# Patient Record
Sex: Female | Born: 1950
Health system: Southern US, Community
[De-identification: ages and names within clinical notes are randomized; demographics above are authoritative.]

## PROBLEM LIST (undated history)

## (undated) DIAGNOSIS — C801 Malignant (primary) neoplasm, unspecified: Secondary | ICD-10-CM

## (undated) DIAGNOSIS — I10 Essential (primary) hypertension: Secondary | ICD-10-CM

## (undated) DIAGNOSIS — G709 Myoneural disorder, unspecified: Secondary | ICD-10-CM

## (undated) DIAGNOSIS — R0600 Dyspnea, unspecified: Secondary | ICD-10-CM

## (undated) DIAGNOSIS — M1990 Unspecified osteoarthritis, unspecified site: Secondary | ICD-10-CM

## (undated) HISTORY — PX: COLONOSCOPY: SHX174

## (undated) HISTORY — DX: Essential (primary) hypertension: I10

## (undated) HISTORY — PX: CARPAL TUNNEL RELEASE: SHX101

## (undated) HISTORY — PX: TONSILLECTOMY: SUR1361

## (undated) HISTORY — PX: BACK SURGERY: SHX140

## (undated) HISTORY — PX: LAPAROSCOPIC CHOLECYSTECTOMY: SUR755

## (undated) HISTORY — PX: ANKLE SURGERY: SHX546

## (undated) HISTORY — PX: KNEE SURGERY: SHX244

---

## 1980-10-15 HISTORY — PX: TUBAL LIGATION: SHX77

## 1990-10-15 DIAGNOSIS — G709 Myoneural disorder, unspecified: Secondary | ICD-10-CM

## 1990-10-15 HISTORY — DX: Myoneural disorder, unspecified: G70.9

## 2000-01-17 ENCOUNTER — Ambulatory Visit (HOSPITAL_COMMUNITY): Admission: RE | Admit: 2000-01-17 | Discharge: 2000-01-17 | Payer: Self-pay | Admitting: Family Medicine

## 2000-01-17 ENCOUNTER — Encounter: Payer: Self-pay | Admitting: Family Medicine

## 2000-02-05 ENCOUNTER — Inpatient Hospital Stay (HOSPITAL_COMMUNITY): Admission: RE | Admit: 2000-02-05 | Discharge: 2000-02-09 | Payer: Self-pay | Admitting: Neurosurgery

## 2000-03-25 ENCOUNTER — Encounter: Admission: RE | Admit: 2000-03-25 | Discharge: 2000-03-25 | Payer: Self-pay | Admitting: Neurosurgery

## 2000-06-25 ENCOUNTER — Encounter: Admission: RE | Admit: 2000-06-25 | Discharge: 2000-06-25 | Payer: Self-pay | Admitting: Neurosurgery

## 2000-09-27 ENCOUNTER — Encounter: Admission: RE | Admit: 2000-09-27 | Discharge: 2000-09-27 | Payer: Self-pay | Admitting: Neurosurgery

## 2000-10-10 ENCOUNTER — Ambulatory Visit (HOSPITAL_COMMUNITY): Admission: RE | Admit: 2000-10-10 | Discharge: 2000-10-10 | Payer: Self-pay | Admitting: Neurosurgery

## 2001-09-02 ENCOUNTER — Encounter: Admission: RE | Admit: 2001-09-02 | Discharge: 2001-09-02 | Payer: Self-pay | Admitting: Neurosurgery

## 2002-03-02 ENCOUNTER — Inpatient Hospital Stay (HOSPITAL_COMMUNITY): Admission: RE | Admit: 2002-03-02 | Discharge: 2002-03-03 | Payer: Self-pay | Admitting: Neurosurgery

## 2003-11-04 ENCOUNTER — Ambulatory Visit (HOSPITAL_COMMUNITY): Admission: RE | Admit: 2003-11-04 | Discharge: 2003-11-04 | Payer: Self-pay | Admitting: Neurosurgery

## 2008-09-17 ENCOUNTER — Ambulatory Visit (HOSPITAL_COMMUNITY): Admission: RE | Admit: 2008-09-17 | Discharge: 2008-09-17 | Payer: Self-pay | Admitting: Neurosurgery

## 2008-10-20 ENCOUNTER — Inpatient Hospital Stay (HOSPITAL_COMMUNITY): Admission: RE | Admit: 2008-10-20 | Discharge: 2008-10-23 | Payer: Self-pay | Admitting: Neurosurgery

## 2010-10-18 ENCOUNTER — Ambulatory Visit
Admission: RE | Admit: 2010-10-18 | Discharge: 2010-10-19 | Payer: Self-pay | Source: Home / Self Care | Attending: Orthopedic Surgery | Admitting: Orthopedic Surgery

## 2010-10-18 LAB — POCT HEMOGLOBIN-HEMACUE: Hemoglobin: 15.8 g/dL — ABNORMAL HIGH (ref 12.0–15.0)

## 2010-12-25 LAB — BASIC METABOLIC PANEL
BUN: 13 mg/dL (ref 6–23)
CO2: 31 mEq/L (ref 19–32)
Calcium: 9.2 mg/dL (ref 8.4–10.5)
Chloride: 102 mEq/L (ref 96–112)
Creatinine, Ser: 0.75 mg/dL (ref 0.4–1.2)
GFR calc Af Amer: >60 mL/min (ref >60)
GFR calc non Af Amer: >60 mL/min (ref >60)
Glucose, Bld: 98 mg/dL (ref 70–99)
Potassium: 3.8 mEq/L (ref 3.5–5.1)
Sodium: 140 mEq/L (ref 135–145)

## 2011-01-29 LAB — BASIC METABOLIC PANEL
BUN: 10 mg/dL (ref 6–23)
CO2: 34 mEq/L — ABNORMAL HIGH (ref 19–32)
Calcium: 8.5 mg/dL (ref 8.4–10.5)
Chloride: 102 mEq/L (ref 96–112)
Creatinine, Ser: 1.06 mg/dL (ref 0.4–1.2)
GFR calc Af Amer: >60 mL/min (ref >60)
GFR calc non Af Amer: 54 mL/min — ABNORMAL LOW (ref >60)
Glucose, Bld: 125 mg/dL — ABNORMAL HIGH (ref 70–99)
Potassium: 4.3 mEq/L (ref 3.5–5.1)
Sodium: 140 mEq/L (ref 135–145)

## 2011-01-29 LAB — CBC
HCT: 35.4 % — ABNORMAL LOW (ref 36.0–46.0)
Hemoglobin: 11.9 g/dL — ABNORMAL LOW (ref 12.0–15.0)
MCHC: 33.6 g/dL (ref 30.0–36.0)
MCV: 90.7 fL (ref 78.0–100.0)
Platelets: 203 10*3/uL (ref 150–400)
RBC: 3.91 MIL/uL (ref 3.87–5.11)
RDW: 13.9 % (ref 11.5–15.5)
WBC: 6.8 10*3/uL (ref 4.0–10.5)

## 2011-02-27 NOTE — Op Note (Signed)
NAME:  Kathryn [name], Kathryn [name]              ACCOUNT NO.:  [id number]   MEDICAL RECORD NO.:  [id number]          PATIENT TYPE:  INP   LOCATION:  3009                         FACILITY:  [facility]   PHYSICIAN:  Cristi Loron, M.D.DATE OF BIRTH:  01-Nov-1950   DATE OF PROCEDURE:  10/20/2008  DATE OF DISCHARGE:                               OPERATIVE REPORT   BRIEF HISTORY:  The patient is a 60 year old white female who has had  multiple prior back surgeries, and she has had worsening back and leg  pain consistent with neurogenic claudication.  She failed medical  management, worked up with a lumbar myelo-CT, which demonstrated she had  multilevel spinal stenosis and degenerative changes.  I discussed the  various treatments options with the patient including surgery.  The  patient has weighed the risks, benefits, and alternative of surgery and  decided to proceed with a decompressive lumbar laminectomy.   PREOPERATIVE DIAGNOSES:  Degenerative disk disease, L2-3, L3-4, L4-5, L5-  1; spinal stenosis; lumbar radiculopathy/myelopathy; lumbago.   POSTOPERATIVE DIAGNOSES:  Degenerative disk disease, L2-3, L3-4, L4-5,  L5-1; spinal stenosis; lumbar radiculopathy/myelopathy; lumbago.   PROCEDURE:  Decompressive laminectomy at L2, L3, L4, and L5 to  decompress the bilateral L2, 3, 4, 5, and S1 nerve roots using  microdissection.   SURGEON:  Tressie Stalker, MD   ASSISTANT:  Colon Branch, MD   ANESTHESIA:  General endotracheal.   ESTIMATED BLOOD LOSS:  100 mL.   SPECIMEN:  None.   DRAINS:  None.   COMPLICATIONS:  None.   DESCRIPTION OF PROCEDURE:  The patient was brought to the operating room  by anesthesia team.  General endotracheal anesthesia was induced.  The  patient was turned to the prone position on the Wilson frame.  The  lumbosacral region was then prepared with Betadine scrub and Betadine  solution.  Sterile drapes were applied.  I then injected the area to be  incised with  Marcaine with epinephrine solution and used a scalpel to  make a linear midline incision through the patient's previous surgical  scar.  We then used electrocautery to perform a bilateral subperiosteal  dissection exposing spinous process and lamina of L1 down to the upper  sacrum.  We obtained intraoperative radiograph to confirm our location.  We then inserted a McCullough and cerebellar retractors for exposure.   We began the decompression by incising interspinous ligament at L1-2, L2-  3, L3-4, L4-5.  We then used the Leksell rongeur to remove the spinous  process of L2, 3, 4 and 5.  We then used the high-speed drill to perform  bilateral L2, L3, L4, and L5 laminotomies.  We completed the  laminectomies from L2 to L5 using the Kerrison punch and removed the L2-  3, L3-4, L4-5, and L5-1 ligamenta flava.  We encountered some epidural  scar tissue from the patient's prior surgeries.  We then used  microdissection to free up the thecal sac and the nerve roots from the  epidural fibrosis and at the overgrowth of ligamentum flavum and the  facets.  We used Kerrison punches to remove the excess ligament  and the  medial aspects of the facets bilaterally and performed a foraminotomy  about the bilateral L2, L3, L4, L5, and S1 nerve roots.  In the process  of doing this, we did not note that at L4-5 on the right, there was an  area of thinned dura.  There was no CSF leakage.  We then inspected the  intervertebral disks at L2-3, L3-4, L4-5, and L5-1 and noted they were  bulging and degenerated, but there was no disk herniation.  We palpated  along the exit route of the bilateral L2, L3, L4, L5, and S1 nerve roots  and noted the neural structures were well decompressed.  We obtained  hemostasis using bipolar cautery.  We irrigated the wound out with  bacitracin solution.  We then laid a piece of DuraGen over the thinned  dura and then placed some Tisseel over this.  We then removed the   retractors and reapproximated the patient's thoracolumbar fascia with  interrupted #1 Vicryl suture, subcutaneous tissue with interrupted 2-0  Vicryl suture, and skin with a running 4-0 nylon suture.  The wound was  then coated with bacitracin ointment and sterile dressings applied.  The  drapes were removed, and the patient was subsequently extubated by the  anesthesia team and transported to the postanesthesia care unit in  stable condition.  All sponge, instrument, and needle counts were  correct at the of the case.      Cristi Loron, M.D.  Electronically Signed     JDJ/MEDQ  D:  10/20/2008  T:  10/21/2008  Job:  [id number]

## 2011-03-02 NOTE — H&P (Signed)
[place]. [facility]  Patient:    Kathryn [name], Kathryn [name]                     MRN: [id number] Adm. Date:  [id number] Attending:  Tressie Stalker D                         History and Physical  CHIEF COMPLAINT:  Right leg pain.  HISTORY OF PRESENT ILLNESS:  This patient is a 60 year old white female who has had prior back problems.  She has underwent a lumbar laminectomy in 1974 and 1984 by Dr. Vear Clock and Dr. Hope Pigeon.  She did well, but in 1997 she had a recurrence of her back pain.  She was treated conservatively, i.e. with medications and steroid injection by Dr. Corlis Hove.  She improved, but in February 2000 she began having increasing numbness in her left leg and severe pain into her right leg. She has been treated with various medications and she failed to improve.  She was sent for lumbar MRI which demonstrated disc herniation, and she was kindly sent for y consultation.  The patient complains of some chronic numbness in her left leg, which she can live with.  She has pain in her right leg, which radiates into her right hip, down the lateral aspect of her right leg, and wraps around her knee anteriorly.  It does not radiate past the knee, does not go down to the foot.  Her back bothers her less  than her leg, and her right leg greater than her left leg.  PAST MEDICAL HISTORY:  Positive for remote history of ruptured disk, cholelithiasis, carpal tunnel syndrome, ankle troubles.  MEDICATIONS PRIOR TO ADMISSION: 1. Celebrex p.o. q.d. 2. Cyclobenzaprine 10 mg p.o. t.i.d. 3. Oxycodone p.r.n.  ALLERGIES:  No known drug allergies.  PAST SURGICAL HISTORY:  Lumbar laminectomy and diskectomy in 1974 and 1981, cholecystectomy in 1994, carpal tunnel release, and five ankle surgeries.  FAMILY MEDICAL HISTORY:  Patients mother is aged 93.  She suffers from Alzheimers disease.  The patients father died age 4 secondary to colon  cancer.  SOCIAL HISTORY:  The patient is a widow, she has two children.  She lives in [place].  She denies tobacco and drug use.  She occasionally drinks alcohol.  PHYSICAL EXAMINATION:  GENERAL:  A morbidly-obese, pleasant 61 year old white female who walks with an  antalgic gait, complaining of right leg pain.  VITAL SIGNS:  Height 5 feet 6 inches, weight 250 pounds.  HEENT:  Normocephalic, atraumatic.  Pupils are equal, round and reactive to light, extraocular muscles intact.  Sclerae white and conjunctivae pink.  Oropharynx benign.  Uvula midline.  NECK:  Supple, there are no masses, meningismus, deformities, tracheal deviation, jugular venous distension, or carotid bruits.  She has normal cervical range of  motion.  Spurling test is negative, Lhermittes sign was not present.  THORAX:  Symmetric.  LUNGS:  Clear to auscultation.  HEART:  Regular rate and rhythm.  ABDOMEN:  Soft, nontender, morbidly obese.  BACK:  She has diffuse tenderness to palpation at the lumbosacral junction. She has a large, well-healed lumbar incision without signs of infection.  There are no deformities, point tenderness.  Straight leg raising is positive on the right, negative on the left.  Fabers testing negative bilaterally.  EXTREMITIES:  No obvious deformities, morbidly obese.  NEUROLOGIC:  The patient is alert and oriented x 3.  Cranial nerves 2-12 are  grossly intact bilaterally.  Vision is grossly normal bilaterally with corrective lenses.  Hearing is grossly normal bilaterally.  Motor strength is 5/5 in her bilateral deltoid, biceps, triceps, hand grips, wrist extensors, inner osseus, gastrocnemius, extensor hallucis longus, and left psoas and quadriceps.  The patients right psoas and quadriceps strength is diminished at 4/5.  Sensory exam is grossly normal to light touch and pinprick sensation except in lateral aspect of her left leg.  Cerebellar exam is intact to rapid  alternating movements of the upper extremities bilaterally.  Deep tendon reflexes are difficult to assess in the patients upper extremities, as they are quite large, but appear to be 1/4 in her bilateral biceps, triceps, brachioradialis.  She has trace gastrocnemius reflexes bilaterally and 1/4 left quadricepss reflex.  Her right quadricep reflex is absent.  IMAGING STUDIES:  The patient has a lumbar MRI performed with and without contrast on January 17, 2000 at [facility] which demonstrates severe degenerative disease at L4-5 and a large right herniated nucleus pulposus at L3-4.  She has degenerative disk disease and prior surgery at L5-S1, but does not seem to have any ongoing neural compression.  ASSESSMENT AND PLAN:  L3-4 and L4-5 degenerative disk disease, herniated nucleus pulposus, spinal stenosis, spondylosis.  I have discussed this issue with the patient and her son and reviewed the MRI scan with them.  She has multiple problems with her back, but the biggest problem seemed to be at L4-5 and L3-4.  At L4-5, she has a significant degenerative disease, spondylosis, and spinal stenosis.  I discussed with her an option of a posterior lumbar interbody fusion with placement of titanium interbody cages.  I also discussed doing a microdiskectomy at L3-4 n the right to remove the ruptured disk.  I have shown them surgical models, discussed the risks of surgery extensively.  I also discussed the alternatives f continued medical management, doing nothing, steroid injections, etc.  The patient has weighed the risks, benefits, and alternatives of surgery and would like to proceed with the L3-4 microdiskectomy and L4-5 posterior lumbar interbody fusion. DD:  02/05/00 TD:  02/06/00 Job: [id number]

## 2011-03-02 NOTE — Op Note (Signed)
NAME:  Kathryn [name], Kathryn [name]                        ACCOUNT NO.:  [id number]   MEDICAL RECORD NO.:  [id number]                   PATIENT TYPE:  OIB   LOCATION:  2870                                 FACILITY:  [facility]   PHYSICIAN:  Cristi Loron, M.D.            DATE OF BIRTH:  1950-12-08   DATE OF PROCEDURE:  11/04/2003  DATE OF DISCHARGE:  11/04/2003                                 OPERATIVE REPORT   HISTORY:  The patient is a 60 year old white female who suffered _________.  She was worked up with NCVs which demonstrated carpal tunnel syndrome.  Her  symptoms were consistent with carpal tunnel syndrome as well.  She failed  medical management.  I discussed the various treatment options with the  patient including surgery.  She weighed the risks, benefits, and  alternatives of surgery and decided to proceed with carpal tunnel release on  the right.   PREOPERATIVE DIAGNOSIS:  Right carpal tunnel syndrome.   POSTOPERATIVE DIAGNOSIS:  Right carpal tunnel syndrome.   PROCEDURE:  Right carpal tunnel release (median nerve neurolysis at the  wrist).   SURGEON:  Cristi Loron, M.D.   ASSISTANT:  None.   ANESTHESIA:  MAC.   SPECIMENS:  None.   DRAINS:  None.   COMPLICATIONS:  None.   DESCRIPTION OF PROCEDURE:  The patient was brought to the operating room by  the anesthesia team.  She was sedated.  Her right upper extremity was then  prepared with Betadine scrub with Betadine solution and sterile drapes were  applied.  I then injected the area to be incised with Marcaine with  epinephrine solution.  Using a scalpel, I made an incision along the  patient's palmar crease.  I used electrocautery to dissect down to the  superficial fascia, divided it with a 15 scalpel, and then dissected down to  the deep fascia, the transverse carpal ligament.  I incised it with a 15  scalpel carefully __________.  I transected it and then identified the  median nerve.  I then ligated the  transverse carpal ligament both proximally  and distally with the Metzenbaum scissors staying along the ulnar aspect of  the median nerve with good proximal and distal decompression.  I then  achieved hemostasis using bipolar cautery.  I then reapproximated the  patient's skin with a running 3-0 nylon suture.  The patient's wound was  then coated with bacitracin ointment and sterile dressings were applied.  The drapes were removed.  The patient was subsequently transported to the  postanesthesia care unit in stable condition.  All sponge, needle, and  instrument counts were correct at the end of this case.                                              Duane Lope.  Lovell Sheehan, M.D.   JDJ/MEDQ  D:  11/04/2003  T:  11/05/2003  Job:  [id number]

## 2011-03-02 NOTE — Discharge Summary (Signed)
[place]. [facility]  Patient:    Kathryn [name], Kathryn [name]                     MRN: [id number] Adm. Date:  [id number] Disc. Date: [id number] Attending:  Tressie Stalker D                           Discharge Summary  For full details of this admission, please refer to typed history and physical.  HISTORY OF PRESENT ILLNESS:  The patient is a 60 year old white female who has undergone a lumbar laminectomy in 1974 and 1984.  She initially did well but she has had ongoing back and right leg pain.  She failed medical management and therefore weighed the risks, benefits, and alternatives of surgery, and decided to proceed with a lumbar decompression, stabilization, and fusion after an MR scan demonstrated significant disk pathology.  For past medical history, past surgical history, medications prior to admission, drug allergies, family medical history, social history, admission physical exam, imaging studies, assessment and planning, etc., please refer to typed history and physical.  HOSPITAL COURSE:  I admitted the patient to [facility] on April 23,2001, with the diagnosis of L3-4 and L4-5 degenerative disease, herniated nucleus pulposus, spondylosis, and spinal stenosis.  On the day of admission, I performed a bilateral L4 re-do laminotomy and foraminotomy with a Gill procedure and posterior lumbar interbody fusion with insertion of titanium interbody cages (14 x 26 mm) at L4-5, and using local morcellized autograft bone and I did a lumbar diskectomy at L3-4 on the right.  The surgery went well without complications.  (For full details of this operation, please refer to typed operative note.  Postoperative course:  The patients postoperative course was essentially unremarkable.  She had considerable postoperative pain as expected and this was managed on a morphine PCA pump.  By February 09, 2000, the patient was afebrile, vital signs were stable, she was eating  well, and ambulating well. Her wound was healing well without signs of infection, and she was requesting discharge to home.  She was therefore discharged on February 09, 2000.  DISCHARGE INSTRUCTIONS:  Patient was given written discharge instructions and instructed to wear a lumbosacral corset at all times while out of bed, and to follow up with me in three to four weeks.  DISCHARGE MEDICATIONS: 1. Percocet #60 one to two p.o. q.4h. p.r.n. for pain. 2. Valium 5 mg #50 one p.o. q.6h. p.r.n. for muscle spasms.  FINAL DIAGNOSES: 1. L3-4 and L4-5 degenerative disease. 2. Spondylosis. 3. Herniated nucleus pulposus. 4. Spinal stenosis.  PROCEDURE PERFORMED:  L4-5 re-do laminectomy, Gill procedure, posterior lumbar interbody fusion, L4-5, using local morcellized autograft bone and insertion of titanium interbody cages, ______ diskectomy, L3-4. DD:  02/28/00 TD:  02/29/00 Job: [id number]

## 2011-03-02 NOTE — Consult Note (Signed)
[facility]  Patient:    Kathryn [name], Kathryn [name]                     MRN: [id number] Adm. Date:  [id number] Attending:  Tressie Stalker D                          Consultation Report  At this point, I had a good decompression of the thecal sac and the bilateral L5 and L4 nerve roots at L4-5.  I now turned my attention to the arthrodesis.  I brought the intraoperative fluoroscope into the field and under its guidance, I  placed instrumentation.  I used the retractors to gently retract the left L5 nerve root and the thecal sac medially, the left L4 nerve root in a cephalad direction. I inserted the vertebral body spreader into the left L4-5 interspace and distracted the vertebral bodies.  I then carefully retracted the right L5 nerve root medially, the right L4 nerve root in a cephalad direction and inserted a 14 mm Tang retractor under fluoroscopic guidance.  I then drilled the interspace, tapped it, inserted a 14 x 26 mm interbody cage under fluoroscopic guidance (Ray), then removed the Tang retractor, inspected the thecal sac and the right L5 and L4 nerve roots for any  damage.  There was not any.  I then removed the vertebral body spreader on the left, inserted the Tang retractor on the left in a similar fashion and inserted  another 14 x 26 mm cage on the left side.  Once again, the nerve roots were not  injured.  I then filled the interbody cage with morcellized autograph bone and hen placed a plastic cap over the cage.  Having completed the arthrodesis at L4-5, I now turned my attention to the diskectomy at L3-4.  I performed a right L3 laminotomy.  I drilled up the right L3 lamina with the Midas Rex high-speed drill.  I removed the right L3-4 ligamentum flavum and widened the laminotomy and decompressed the thecal sac.  I identified the L4 nerve root and then I palpated just ventral to it with the nerve hook and noted there was a large  free-fragment disk herniation.  I freed it up with the microdissectors and then removed it in several large fragments, decompressing the thecal sac and the L4 nerve root.  I then palpated about the ventral surface of the thecal sac and then the L4 nerve root, and it was well-decompressed.  I achieved hemostasis using bipolar electrocautery and Gelfoam.  I then palpated about the  L4-5 thecal sac and the bilateral L5 nerve roots and the extraforaminal L4 nerve roots and noted that they were well-decompressed.  I copiously irrigated the wound with bacitracin solution and removed the solution.  I inspected the dura for any CSF leak.  I did not see any.  I then removed the McCullough retractor and reapproximated the patients thoracolumbar fascia with interrupted #1 Vicryl and  the subcutaneous tissue with interrupted 2-0 Vicryl, the skin with a running 3-0 nylon suture.  The wound was then covered with bacitracin ointment and a sterile dressing was applied and drapes were removed, and the patient subsequently returned to the supine position, where she was extubated by the anesthesia team and transported to the postanesthesia care unit in stable condition.  All sponge, instrument, and needle counts were correct at the end of the case. DD:  02/05/00 TD:  02/06/00  Job: [id number]

## 2011-03-02 NOTE — Discharge Summary (Signed)
NAME:  Kathryn [name], Kathryn [name]              ACCOUNT NO.:  [id number]   MEDICAL RECORD NO.:  [id number]          PATIENT TYPE:  INP   LOCATION:  3009                         FACILITY:  [facility]   PHYSICIAN:  Cristi Loron, M.D.DATE OF BIRTH:  August 09, 1951   DATE OF ADMISSION:  10/20/2008  DATE OF DISCHARGE:  10/23/2008                               DISCHARGE SUMMARY   BRIEF HISTORY:  The patient is a 60 year old white female who has had  multiple prior back surgeries.  The patient has had worsening back and  leg pain consistent with neurogenic claudication.  She failed medical  management and was worked up with a lumbar CT which demonstrated the  patient had multilevel spinal stenosis and degenerative changes.  I  discussed various treatments options with the patient including the  surgery.  The patient has weighed the risks, benefits, and alternatives  of surgery and decided to proceed with a decompressive lumbar  laminectomy.   For further details of this admission, please refer to typed history and  physical.   HOSPITAL COURSE:  Admitted the patient to [facility] on March 24, 2005.  On the day of admission, I performed an L2, L3, L4, and L5  laminectomy.  The surgery went well (for full details of this operation,  please refer to typed operative note).   POSTOPERATIVE COURSE:  The patient's postoperative course was  unremarkable and she was discharged home on postoperative day #3, i.e.,  October 23, 2008.   DISCHARGE INSTRUCTIONS:  The patient is instructed to follow up with me  in 1 to 2 weeks of suture removal.   DISCHARGE PRESCRIPTIONS:  1. Percocet 10/325, #100, one p.o. q.4 h. as needed for pain.  2. Valium 5 mg, #50, one p.o. q.8 h. p.r.n. for muscle spasm.  3. Neurontin 600 mg, #90, one p.o. t.i.d.   FINAL DIAGNOSES:  L2-3, L3-4, L4-5, and L5-S1 degenerative disease,  spinal stenosis, lumbar radiculopathy, and lumbago.   PROCEDURE PERFORMED:  1. Decompressive  laminectomy at L2, L3, L4, and L5.  2. Decompressive bilateral L2, L3, L4, L5 as well as S1 nerve roots      using microdissection.      Cristi Loron, M.D.  Electronically Signed     JDJ/MEDQ  D:  11/23/2008  T:  11/24/2008  Job:  [id number]

## 2011-03-02 NOTE — Op Note (Signed)
[place]  Patient:    Kathryn [name], Kathryn [name]                     MRN: [id number] Proc. Date: 02/05/00 Adm. 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  PROCEDURE:                    L4 redo laminotomy-foraminotomy with posterior/                               Gill procedure with posterior lumbar interbody                               fusion L4-5 using titanium interbody cages (Ray                               cages) 14 x 26 mm, and local morcellized                               autograft bone; a lumbar diskectomy at L3-4 on                               the right.  SURGEON:                      Cristi Loron, M.D.  ASSISTANT:                    Dr. Lynne Logan _____.  ANESTHESIA:                   General endotracheal.  ESTIMATED BLOOD LOSS:         Less than 300 cc.  SPECIMENS:                    None.  DRAINS:                       None.  BRIEF HISTORY:  The patient is a 60 year old white female who has undergone a lumbar laminectomy in 1974 and 1984.  She initially did well, but she has had ongoing back and now right leg pain.  She has failed medical management, therefore weighed the risks, benefits, and alternatives of surgery, desired to proceed with a lumbar decompression and stabilization and fusion after an MR scan demonstrated significant disk pathology.  DESCRIPTION OF PROCEDURE:  The patient was brought to the operating  room by the anesthesia team.  General endotracheal anesthesia was induced.  The patient was then turned to the prone position on the Wilson frame.  The  left sacral region was then prepared with Betadine scrub and Betadine solution and sterile drapes were applied, and we then injected the area to be incised with Marcaine with epinephrine solution.  I used a scalpel to ellipse out the patients prior surgical incision.  I encountered significant scarring.  I used electrocautery to dissect out the thoracolumbar fascia.  I divided the fascia bilaterally, performing bilateral subperiosteal dissections, stripping the paraspinous musculature from the spinous processes of the lamina bilaterally of L3, L4, and L5.  I inserted the McCullough retractor for exposure.  I obtained intraoperative radiograph to confirm the location.  I then used the Leksell rongeur to remove some of the abundant scar tissue from the periosteal region.  I then used the Midas Rex high-speed drill to perform bilateral L4 laminotomies.  I drilled in a stepwise direction until I encountered the insertion of the ligamentum flavum.  I then widened the laminotomies with a Kerrison punch, removing the L4-5 ligamentum flavum, and performed a foraminotomy about the L5 nerve root, identifying and decompressing the L4 nerve root in the neural foramen.  I carefully dissected the abundant scar tissue from the epidural space to free up the nerve sac.  In doing this on the left side, I did create a small unintentional durotomy.  I sewed this up with a single figure-of-eight 6-0 Prolene suture.  I performed a Valsalva maneuver on the patient and there was no further spinal fluid leak.  I then incised into the intervertebral disk at L5 and performed an aggressive diskectomy at L4-5 with the Epstein and Scoville curets and the pituitary forceps.  DD:  02/05/00 TD:  02/06/00 Job: [id number]

## 2011-03-02 NOTE — Consult Note (Signed)
[place]  Patient:    Kathryn [name], Kathryn [name] Visit Number: [id number] MRN: [id number]          Service Type: SUR Location: [id number] Attending Physician:  Cristi Loron Dictated by:   Cristi Loron, M.D. Admit Date:  03/02/2002 Discharge Date: 03/03/2002                            Consultation Report  BRIEF HISTORY:  The patient is a 60 year old white female who has suffered from back and leg pain for many years. She has undergone previous surgeries including an L4-L5 push and lumbar interbody fusion in place of  interbody titanium cages by me. She has had chronic back pain, but things worsened several months ago when she began having severe pain radiating down the back of her left leg and more moderate pain radiating down more or less the side of her right leg. She failed medical management, was worked up with a lumbar MRI which demonstrated recurrent herniated disk at L3-L4 on the right and L5-S1 on the left. The fuse level i.e. L4-L5 looked pretty good. I discussed the various treatment options with her including doing nothing, continued medical management, and surgery. The patient weighed the risks, benefits, alternatives, surgeons, and decides to proceed with a redo microdiskectomy.  PREOPERATIVE DIAGNOSES: 1. Right L3-L4 recurrent herniated nucleus pulposus. 2. Left L5-S1 recurrent herniated nucleus pulposus. 3. Lumbar degenerative disk disease. 4. Lumbar spinal stenosis. 5. Lumbar radiculopathy.  POSTOPERATIVE DIAGNOSES: 1. Right L3-L4 recurrent herniated nucleus pulposus. 2. Left L5-S1 recurrent herniated nucleus pulposus. 3. Lumbar degenerative disk disease. 4. Lumbar spinal stenosis. 5. Lumbar radiculopathy.  PROCEDURE:  Redo right L3-L4 microdiskectomy; redo left L5-S1 microdiskectomy using microdissection.  SURGEON:  Cristi Loron, M.D.  ASSISTANT:  Clydene Fake, M.D.  ANESTHESIA:  General  endotracheal.  ESTIMATED BLOOD LOSS:  200 cc.  SPECIMENS:  None.  DRAIN:  None.  COMPLICATIONS:  None.  DESCRIPTION OF PROCEDURE:  The patient was brought to the operating room by the anesthesia team. General endotracheal anesthesia was induced. The patient was then turned to the prone position on the Wilson frame. Her lumbosacral region was then prepared with Betadine and scrubbed using Betadine solution. Sterile drapes were applied and then injected the air to midsize with Marcaine with epinephrine solution and then used the scalpel to make a linear midline incision through the patients preexisting surgical scar. I used electrocautery to dissect down to thoracolumbar fascia and divided the fascia just to the right of the midline at L3-L4 and performed a right-sided subperiosteal dissection, stripping the right paraspinous musculature from the right spinous process lamina of L3 and L4. I then inserted the McCullough retractor for exposure and obtained an intraoperative radiographic to confirm the location. Then brought the operative microscopes in the field, enlarged magnification and illumination, I completed the microdissection/decompression. I then used the high-speed drill to clear some of the soft tissue and scar tissue from the right L3 and L4 lamina. I then extended the previous right L3 and L4 lamina, and then using high-speed drill to extend the patients prior right L3 laminotomy in the cephalad direction. I drilled until I encountered some relatively nonscarred dura. I then used microdissection to free up the dura from the epidural fibrosis and then I widened the right L3 laminotomy and removed some epidural scar tissue. Decompressing the thecal sac, I identified the L4 nerve root and performed  a foraminotomy about it. I used microsection to free up the thecal sac and the L4 root nerve root from the underlying disk herniation and then I gently retracted the thecal sac and  the right L4 nerve root in the medial direction exposing the underlying herniated disk. I incised the right L3-L4 herniated disk and removed several fragments using pituitary forceps. It had migrated in the cephalad and caudal direction and was compressing the L4 nerve root. I then entered the intervertebral disk space and performed a partial diskectomy using new pituitary forceps and the arthroscope with curettes. When I was satisfied with the intervertebral diskectomy, I palpated on the ventral surface of the thecal sac and along the undersurface of the L4 nerve root and noted that the neural structures were well decompressed. I then used the osteophyte tool to remove some spondylosis from the vertebral end plate at Z6-X0 on the right. At this point, I had a good decompression at this level.  I now turned my attention to the L5-S1 interspace. I removed the McCullough retractor and then I used electrocautery to incise the left L5-S1 thoracolumbar fascia and then performed a left-sided subperiosteal dissection, stripping the paraspinous musculature from the left spinous process lamina of L5 and the upper sacrum. Again, there was quite a bit of scar tissue from her prior back surgeries at this level. I inserted the McCullough retractor for exposure and then obtained the intraoperative radiograph to confirm the location. Again, I used the high-speed drill to drill away some of the soft tissue and expose the left L5 lamina. I then used the high-speed drill to perform a left L5 laminotomy, and then I used a Kerrison punch to widen the left L5 laminotomy as well as to remove some epidural scar tissue and remove the remainder of the left L5-S1 ligamentum flavum. I identified the left S1 nerve root and performed a foraminotomy about it and then used microsection to free up the thecal sac and the left S1 nerve root and then used the DErrico retractor to retract the thecal sac and the S1 nerve root  medially. This exposed a large underlying herniated disk which was partially calcified. I excised this herniated disk and performed a partial diskectomy using a  pituitary forceps, and I removed the more calcified part using the osteophyte tool. At this point, I was satisfied with the decompression. I palpated along the ventral surface of the thecal sac at L5-S1 as well as along the undersurface of the S1 nerve root and noted neural structure well decompressed at this level as well. I then achieved hemostasis using bipolar electrocautery and then copiously irrigated the wound out with bacitracin solution, and then I removed the McCullough retractor. I then reapproximated the patients thoracolumbar fascia with interrupted 1 Vicryl suture. I reapproximated the patients subcutaneous tissue with interrupted 2-0 Vicryl suture and the skin with Steri-Strips and Benzoin. The wound was then coated with Bacitracin ointment and the sterile dressings were applied and drapes were removed. The patient was subsequently returned to the supine position where she was extubated by the anesthesia team and transported to the Postanesthesia Care Unit in stable condition. All sponge, instrument, and needle counts were corrected. Dictated by:   Cristi Loron, M.D. Attending Physician:  Tressie Stalker D DD:  03/02/02 TD:  03/04/02 Job: [id number]

## 2011-07-19 LAB — CBC
HCT: 45.4 % (ref 36.0–46.0)
Hemoglobin: 15.4 g/dL — ABNORMAL HIGH (ref 12.0–15.0)
MCHC: 33.9 g/dL (ref 30.0–36.0)
MCV: 89.9 fL (ref 78.0–100.0)
Platelets: 221 10*3/uL (ref 150–400)
RBC: 5.05 MIL/uL (ref 3.87–5.11)
RDW: 14 % (ref 11.5–15.5)
WBC: 6.2 10*3/uL (ref 4.0–10.5)

## 2011-07-19 LAB — BASIC METABOLIC PANEL
BUN: 13 mg/dL (ref 6–23)
CO2: 26 mEq/L (ref 19–32)
Calcium: 9.3 mg/dL (ref 8.4–10.5)
Chloride: 99 mEq/L (ref 96–112)
Creatinine, Ser: 0.97 mg/dL (ref 0.4–1.2)
GFR calc Af Amer: >60 mL/min (ref >60)
GFR calc non Af Amer: 59 mL/min — ABNORMAL LOW (ref >60)
Glucose, Bld: 108 mg/dL — ABNORMAL HIGH (ref 70–99)
Potassium: 3.7 mEq/L (ref 3.5–5.1)
Sodium: 136 mEq/L (ref 135–145)

## 2011-10-04 ENCOUNTER — Other Ambulatory Visit: Payer: Self-pay | Admitting: Anesthesiology

## 2011-10-04 DIAGNOSIS — M545 Low back pain: Secondary | ICD-10-CM

## 2011-10-17 ENCOUNTER — Ambulatory Visit
Admission: RE | Admit: 2011-10-17 | Discharge: 2011-10-17 | Disposition: A | Discharge status: Home / Self Care | Payer: Medicare Other | Source: Ambulatory Visit | Attending: Anesthesiology | Admitting: Anesthesiology

## 2011-10-17 DIAGNOSIS — M545 Low back pain: Secondary | ICD-10-CM

## 2011-10-17 MED ORDER — GADOBENATE DIMEGLUMINE 529 MG/ML IV SOLN
20.0000 mL | Freq: Once | INTRAVENOUS | Status: AC | PRN
  Administered 2011-10-17: 20 mL via INTRAVENOUS

## 2013-06-18 ENCOUNTER — Other Ambulatory Visit: Payer: Self-pay | Admitting: Anesthesiology

## 2013-06-18 DIAGNOSIS — M545 Low back pain: Secondary | ICD-10-CM

## 2013-06-22 ENCOUNTER — Other Ambulatory Visit: Payer: Medicare Other

## 2013-06-22 ENCOUNTER — Ambulatory Visit
Admission: RE | Admit: 2013-06-22 | Discharge: 2013-06-22 | Disposition: A | Discharge status: Home / Self Care | Payer: Medicare Other | Source: Ambulatory Visit | Attending: Anesthesiology | Admitting: Anesthesiology

## 2013-06-22 DIAGNOSIS — M545 Low back pain: Secondary | ICD-10-CM

## 2013-06-22 MED ORDER — GADOBENATE DIMEGLUMINE 529 MG/ML IV SOLN
20.0000 mL | Freq: Once | INTRAVENOUS | Status: AC | PRN
  Administered 2013-06-22: 20 mL via INTRAVENOUS

## 2013-06-23 ENCOUNTER — Other Ambulatory Visit: Payer: Medicare Other

## 2013-07-22 ENCOUNTER — Other Ambulatory Visit: Payer: Self-pay | Admitting: Neurosurgery

## 2013-07-30 ENCOUNTER — Encounter (HOSPITAL_COMMUNITY): Payer: Self-pay

## 2013-07-30 NOTE — Pre-Procedure Instructions (Addendum)
Kathryn [name]  07/30/2013   Your procedure is scheduled on:  Oct 23 @1105   Report to [facility]  2 * 3 at 0800 AM.  Call this number if you have problems the morning of surgery: [phone]    Remember:   Do not eat food or drink liquids after midnight.   Take these medicines the morning of surgery with A SIP OF WATER: gabapentin (Neurontin), Claritin (Loratadine) if needed, Oxycodone-acetaminophen (Percocet) if needed, and Tapentadol (Nucynta) if needed.   Do not wear jewelry, make-up or nail polish.  Do not wear lotions, powders, or perfumes. You may wear deodorant.  Do not shave 48 hours prior to surgery. Men may shave face and neck.  Do not bring valuables to the hospital.  [facility] is not responsible                  for any belongings or valuables.               Contacts, dentures or bridgework may not be worn into surgery.  Leave suitcase in the car. After surgery it may be brought to your room.  For patients admitted to the hospital, discharge time is determined by your                treatment team.               Patients discharged the day of surgery will not be allowed to drive  home.    Special Instructions: Shower using CHG 2 nights before surgery and the night before surgery.  If you shower the day of surgery use CHG.  Use special wash - you have one bottle of CHG for all showers.  You should use approximately 1/3 of the bottle for each shower.   Please read over the following fact sheets that you were given: Pain Booklet, Coughing and Deep Breathing, MRSA Information and Surgical Site Infection Prevention

## 2013-07-31 ENCOUNTER — Encounter (HOSPITAL_COMMUNITY)
Admission: RE | Admit: 2013-07-31 | Discharge: 2013-07-31 | Disposition: A | Discharge status: Home / Self Care | Payer: Medicare Other | Source: Ambulatory Visit | Attending: Neurosurgery | Admitting: Neurosurgery

## 2013-07-31 ENCOUNTER — Encounter (HOSPITAL_COMMUNITY): Payer: Self-pay

## 2013-07-31 DIAGNOSIS — Z01812 Encounter for preprocedural laboratory examination: Secondary | ICD-10-CM | POA: Insufficient documentation

## 2013-07-31 HISTORY — DX: Unspecified osteoarthritis, unspecified site: M19.90

## 2013-07-31 LAB — CBC
MCH: 31.1 pg (ref 26.0–34.0)
MCHC: 34.4 g/dL (ref 30.0–36.0)
Platelets: 213 10*3/uL (ref 150–400)
RBC: 4.56 MIL/uL (ref 3.87–5.11)

## 2013-07-31 LAB — BASIC METABOLIC PANEL
Calcium: 9.6 mg/dL (ref 8.4–10.5)
Chloride: 100 mEq/L (ref 96–112)
GFR calc non Af Amer: >90 mL/min (ref >90)
Glucose, Bld: 80 mg/dL (ref 70–99)
Potassium: 4 mEq/L (ref 3.5–5.1)
Sodium: 141 mEq/L (ref 135–145)

## 2013-07-31 LAB — SURGICAL PCR SCREEN: MRSA, PCR: NEGATIVE

## 2013-07-31 NOTE — Progress Notes (Signed)
PCP is Dr Shea Evans. Kathryn [name]  Denies seeing a cardiologist. Denies a recent CXR or EKG.  Denies having a stress test, echo, or card cath.

## 2013-08-05 MED ORDER — CEFAZOLIN SODIUM-DEXTROSE 2-3 GM-% IV SOLR
2.0000 g | INTRAVENOUS | Status: AC
  Administered 2013-08-06: 2 g via INTRAVENOUS
  Filled 2013-08-05: qty 50

## 2013-08-06 ENCOUNTER — Encounter (HOSPITAL_COMMUNITY): Payer: Medicare Other | Admitting: Vascular Surgery

## 2013-08-06 ENCOUNTER — Inpatient Hospital Stay (HOSPITAL_COMMUNITY): Payer: Medicare Other

## 2013-08-06 ENCOUNTER — Inpatient Hospital Stay (HOSPITAL_COMMUNITY)
Admission: RE | Admit: 2013-08-06 | Discharge: 2013-08-12 | DRG: 519 | Disposition: A | Discharge status: Home / Self Care | Payer: Medicare Other | Source: Ambulatory Visit | Attending: Neurosurgery | Admitting: Neurosurgery

## 2013-08-06 ENCOUNTER — Encounter (HOSPITAL_COMMUNITY): Payer: Self-pay | Admitting: *Deleted

## 2013-08-06 ENCOUNTER — Encounter (HOSPITAL_COMMUNITY)
Admission: RE | Disposition: A | Discharge status: Home / Self Care | Payer: Self-pay | Source: Ambulatory Visit | Attending: Neurosurgery

## 2013-08-06 ENCOUNTER — Inpatient Hospital Stay (HOSPITAL_COMMUNITY): Payer: Medicare Other | Admitting: Anesthesiology

## 2013-08-06 DIAGNOSIS — M47817 Spondylosis without myelopathy or radiculopathy, lumbosacral region: Secondary | ICD-10-CM | POA: Diagnosis present

## 2013-08-06 DIAGNOSIS — IMO0001 Reserved for inherently not codable concepts without codable children: Secondary | ICD-10-CM | POA: Diagnosis not present

## 2013-08-06 DIAGNOSIS — M5126 Other intervertebral disc displacement, lumbar region: Principal | ICD-10-CM | POA: Diagnosis present

## 2013-08-06 DIAGNOSIS — M51379 Other intervertebral disc degeneration, lumbosacral region without mention of lumbar back pain or lower extremity pain: Secondary | ICD-10-CM | POA: Diagnosis present

## 2013-08-06 DIAGNOSIS — G9601 Cranial cerebrospinal fluid leak, spontaneous: Secondary | ICD-10-CM | POA: Diagnosis not present

## 2013-08-06 DIAGNOSIS — G9741 Accidental puncture or laceration of dura during a procedure: Secondary | ICD-10-CM | POA: Diagnosis not present

## 2013-08-06 DIAGNOSIS — M5137 Other intervertebral disc degeneration, lumbosacral region: Secondary | ICD-10-CM | POA: Diagnosis present

## 2013-08-06 DIAGNOSIS — IMO0002 Reserved for concepts with insufficient information to code with codable children: Secondary | ICD-10-CM | POA: Diagnosis not present

## 2013-08-06 DIAGNOSIS — M129 Arthropathy, unspecified: Secondary | ICD-10-CM | POA: Diagnosis present

## 2013-08-06 DIAGNOSIS — M898X9 Other specified disorders of bone, unspecified site: Secondary | ICD-10-CM | POA: Diagnosis present

## 2013-08-06 DIAGNOSIS — G8929 Other chronic pain: Secondary | ICD-10-CM | POA: Diagnosis present

## 2013-08-06 DIAGNOSIS — M4716 Other spondylosis with myelopathy, lumbar region: Secondary | ICD-10-CM

## 2013-08-06 DIAGNOSIS — Z6839 Body mass index (BMI) 39.0-39.9, adult: Secondary | ICD-10-CM

## 2013-08-06 DIAGNOSIS — G8918 Other acute postprocedural pain: Secondary | ICD-10-CM | POA: Diagnosis not present

## 2013-08-06 HISTORY — PX: LUMBAR LAMINECTOMY/DECOMPRESSION MICRODISCECTOMY: SHX5026

## 2013-08-06 SURGERY — LUMBAR LAMINECTOMY/DECOMPRESSION MICRODISCECTOMY 1 LEVEL
Anesthesia: General | Site: Back | Laterality: Left | Wound class: Clean

## 2013-08-06 MED ORDER — LACTATED RINGERS IV SOLN
INTRAVENOUS | Status: DC
  Administered 2013-08-06: 09:00:00 via INTRAVENOUS

## 2013-08-06 MED ORDER — ACETAMINOPHEN 650 MG RE SUPP: 650.0000 mg | RECTAL | Status: DC | PRN

## 2013-08-06 MED ORDER — LIDOCAINE HCL (CARDIAC) 20 MG/ML IV SOLN
INTRAVENOUS | Status: DC | PRN
  Administered 2013-08-06: 40 mg via INTRAVENOUS

## 2013-08-06 MED ORDER — OXYCODONE-ACETAMINOPHEN 10-325 MG PO TABS: 1.0000 | ORAL_TABLET | ORAL | Status: DC | PRN

## 2013-08-06 MED ORDER — THROMBIN 5000 UNITS EX SOLR
CUTANEOUS | Status: DC | PRN
  Administered 2013-08-06 (×2): 5000 [IU] via TOPICAL

## 2013-08-06 MED ORDER — BACITRACIN 50000 UNITS IM SOLR
INTRAMUSCULAR | Status: DC | PRN
  Administered 2013-08-06: 11:00:00

## 2013-08-06 MED ORDER — NEOSTIGMINE METHYLSULFATE 1 MG/ML IJ SOLN
INTRAMUSCULAR | Status: DC | PRN
  Administered 2013-08-06: 3 mg via INTRAVENOUS

## 2013-08-06 MED ORDER — LACTATED RINGERS IV SOLN
INTRAVENOUS | Status: DC | PRN
  Administered 2013-08-06 (×2): via INTRAVENOUS

## 2013-08-06 MED ORDER — HYDROMORPHONE 0.3 MG/ML IV SOLN
INTRAVENOUS | Status: DC
  Administered 2013-08-06: 23:00:00 via INTRAVENOUS
  Administered 2013-08-07: 0.39 mg via INTRAVENOUS
  Administered 2013-08-07: 0.7 mg via INTRAVENOUS
  Filled 2013-08-06: qty 25

## 2013-08-06 MED ORDER — ADULT MULTIVITAMIN W/MINERALS CH
1.0000 | ORAL_TABLET | Freq: Every day | ORAL | Status: DC
  Administered 2013-08-06: 1 via ORAL
  Filled 2013-08-06 (×3): qty 1

## 2013-08-06 MED ORDER — HYDROCODONE-ACETAMINOPHEN 5-325 MG PO TABS: 1.0000 | ORAL_TABLET | ORAL | Status: DC | PRN

## 2013-08-06 MED ORDER — DIPHENHYDRAMINE HCL (SLEEP) 25 MG PO TABS: 100.0000 mg | ORAL_TABLET | Freq: Two times a day (BID) | ORAL | Status: DC

## 2013-08-06 MED ORDER — DOCUSATE SODIUM 100 MG PO CAPS
100.0000 mg | ORAL_CAPSULE | Freq: Two times a day (BID) | ORAL | Status: DC
  Administered 2013-08-06: 100 mg via ORAL
  Filled 2013-08-06: qty 1

## 2013-08-06 MED ORDER — DIPHENHYDRAMINE HCL 50 MG/ML IJ SOLN: 12.5000 mg | Freq: Four times a day (QID) | INTRAMUSCULAR | Status: DC | PRN

## 2013-08-06 MED ORDER — LABETALOL HCL 5 MG/ML IV SOLN
INTRAVENOUS | Status: DC | PRN
  Administered 2013-08-06 (×4): 5 mg via INTRAVENOUS

## 2013-08-06 MED ORDER — HYDROMORPHONE HCL PF 1 MG/ML IJ SOLN
INTRAMUSCULAR | Status: AC
  Filled 2013-08-06: qty 1

## 2013-08-06 MED ORDER — MORPHINE SULFATE 2 MG/ML IJ SOLN
1.0000 mg | INTRAMUSCULAR | Status: DC | PRN
  Administered 2013-08-06: 4 mg via INTRAVENOUS
  Filled 2013-08-06: qty 2

## 2013-08-06 MED ORDER — MULTI-VITAMIN/MINERALS PO TABS: 1.0000 | ORAL_TABLET | Freq: Every day | ORAL | Status: DC

## 2013-08-06 MED ORDER — ONDANSETRON HCL 4 MG/2ML IJ SOLN: 4.0000 mg | INTRAMUSCULAR | Status: DC | PRN

## 2013-08-06 MED ORDER — GLYCOPYRROLATE 0.2 MG/ML IJ SOLN
INTRAMUSCULAR | Status: DC | PRN
  Administered 2013-08-06: .4 mg via INTRAVENOUS

## 2013-08-06 MED ORDER — TAPENTADOL HCL ER 200 MG PO TB12: 200.0000 mg | ORAL_TABLET | Freq: Two times a day (BID) | ORAL | Status: DC

## 2013-08-06 MED ORDER — OXYCODONE HCL 5 MG PO TABS
5.0000 mg | ORAL_TABLET | ORAL | Status: DC | PRN
  Administered 2013-08-06: 5 mg via ORAL
  Filled 2013-08-06: qty 1

## 2013-08-06 MED ORDER — PROMETHAZINE HCL 25 MG/ML IJ SOLN: 6.2500 mg | INTRAMUSCULAR | Status: DC | PRN

## 2013-08-06 MED ORDER — MIDAZOLAM HCL 2 MG/2ML IJ SOLN: 0.5000 mg | Freq: Once | INTRAMUSCULAR | Status: DC | PRN

## 2013-08-06 MED ORDER — DIAZEPAM 5 MG PO TABS
ORAL_TABLET | ORAL | Status: AC
  Filled 2013-08-06: qty 1

## 2013-08-06 MED ORDER — ONDANSETRON HCL 4 MG/2ML IJ SOLN: 4.0000 mg | Freq: Four times a day (QID) | INTRAMUSCULAR | Status: DC | PRN

## 2013-08-06 MED ORDER — 0.9 % SODIUM CHLORIDE (POUR BTL) OPTIME
TOPICAL | Status: DC | PRN
  Administered 2013-08-06: 1000 mL

## 2013-08-06 MED ORDER — OXYCODONE-ACETAMINOPHEN 5-325 MG PO TABS: 1.0000 | ORAL_TABLET | ORAL | Status: DC | PRN

## 2013-08-06 MED ORDER — SODIUM CHLORIDE 0.9 % IJ SOLN: 9.0000 mL | INTRAMUSCULAR | Status: DC | PRN

## 2013-08-06 MED ORDER — MIDAZOLAM HCL 5 MG/5ML IJ SOLN
INTRAMUSCULAR | Status: DC | PRN
  Administered 2013-08-06: 2 mg via INTRAVENOUS

## 2013-08-06 MED ORDER — DEXAMETHASONE SODIUM PHOSPHATE 4 MG/ML IJ SOLN
4.0000 mg | Freq: Four times a day (QID) | INTRAMUSCULAR | Status: DC
  Administered 2013-08-06 – 2013-08-07 (×2): 4 mg via INTRAVENOUS
  Filled 2013-08-06 (×4): qty 1

## 2013-08-06 MED ORDER — MEPERIDINE HCL 25 MG/ML IJ SOLN: 6.2500 mg | INTRAMUSCULAR | Status: DC | PRN

## 2013-08-06 MED ORDER — DIAZEPAM 5 MG PO TABS
5.0000 mg | ORAL_TABLET | Freq: Four times a day (QID) | ORAL | Status: DC | PRN
  Administered 2013-08-06 (×2): 5 mg via ORAL
  Filled 2013-08-06: qty 1

## 2013-08-06 MED ORDER — ACETAMINOPHEN 325 MG PO TABS: 650.0000 mg | ORAL_TABLET | ORAL | Status: DC | PRN

## 2013-08-06 MED ORDER — CEFAZOLIN SODIUM-DEXTROSE 2-3 GM-% IV SOLR
2.0000 g | Freq: Three times a day (TID) | INTRAVENOUS | Status: AC
  Administered 2013-08-06 – 2013-08-07 (×2): 2 g via INTRAVENOUS
  Filled 2013-08-06 (×2): qty 50

## 2013-08-06 MED ORDER — LACTATED RINGERS IV SOLN
INTRAVENOUS | Status: DC
  Administered 2013-08-06 – 2013-08-07 (×2): via INTRAVENOUS

## 2013-08-06 MED ORDER — GABAPENTIN 300 MG PO CAPS
600.0000 mg | ORAL_CAPSULE | Freq: Three times a day (TID) | ORAL | Status: DC
  Administered 2013-08-06 – 2013-08-08 (×4): 600 mg via ORAL
  Filled 2013-08-06 (×9): qty 2

## 2013-08-06 MED ORDER — EPHEDRINE SULFATE 50 MG/ML IJ SOLN
INTRAMUSCULAR | Status: DC | PRN
  Administered 2013-08-06: 15 mg via INTRAVENOUS
  Administered 2013-08-06: 10 mg via INTRAVENOUS

## 2013-08-06 MED ORDER — NALOXONE HCL 0.4 MG/ML IJ SOLN: 0.4000 mg | INTRAMUSCULAR | Status: DC | PRN

## 2013-08-06 MED ORDER — PROPOFOL 10 MG/ML IV BOLUS
INTRAVENOUS | Status: DC | PRN
  Administered 2013-08-06: 50 mg via INTRAVENOUS
  Administered 2013-08-06: 150 mg via INTRAVENOUS

## 2013-08-06 MED ORDER — OXYCODONE HCL 5 MG PO TABS
ORAL_TABLET | ORAL | Status: AC
  Filled 2013-08-06: qty 1

## 2013-08-06 MED ORDER — OXYCODONE HCL 5 MG PO TABS
15.0000 mg | ORAL_TABLET | Freq: Four times a day (QID) | ORAL | Status: DC
  Administered 2013-08-06 – 2013-08-10 (×14): 15 mg via ORAL
  Filled 2013-08-06 (×16): qty 3

## 2013-08-06 MED ORDER — DIPHENHYDRAMINE HCL 12.5 MG/5ML PO ELIX: 12.5000 mg | ORAL_SOLUTION | Freq: Four times a day (QID) | ORAL | Status: DC | PRN

## 2013-08-06 MED ORDER — MENTHOL 3 MG MT LOZG: 1.0000 | LOZENGE | OROMUCOSAL | Status: DC | PRN

## 2013-08-06 MED ORDER — KETOROLAC TROMETHAMINE 30 MG/ML IJ SOLN
30.0000 mg | Freq: Four times a day (QID) | INTRAMUSCULAR | Status: DC
  Administered 2013-08-06 – 2013-08-07 (×2): 30 mg via INTRAVENOUS
  Filled 2013-08-06 (×5): qty 1

## 2013-08-06 MED ORDER — ROCURONIUM BROMIDE 100 MG/10ML IV SOLN
INTRAVENOUS | Status: DC | PRN
  Administered 2013-08-06: 50 mg via INTRAVENOUS

## 2013-08-06 MED ORDER — FENTANYL CITRATE 0.05 MG/ML IJ SOLN
INTRAMUSCULAR | Status: DC | PRN
  Administered 2013-08-06: 250 ug via INTRAVENOUS
  Administered 2013-08-06: 100 ug via INTRAVENOUS
  Administered 2013-08-06: 50 ug via INTRAVENOUS

## 2013-08-06 MED ORDER — HYDROMORPHONE HCL PF 1 MG/ML IJ SOLN
0.2500 mg | INTRAMUSCULAR | Status: DC | PRN
  Administered 2013-08-06 (×4): 0.5 mg via INTRAVENOUS

## 2013-08-06 MED ORDER — OXYCODONE-ACETAMINOPHEN 5-325 MG PO TABS
1.0000 | ORAL_TABLET | ORAL | Status: DC | PRN
  Administered 2013-08-06: 1 via ORAL
  Filled 2013-08-06: qty 1

## 2013-08-06 MED ORDER — LORATADINE 10 MG PO TABS
10.0000 mg | ORAL_TABLET | Freq: Every day | ORAL | Status: DC | PRN
  Filled 2013-08-06: qty 1

## 2013-08-06 MED ORDER — OXYCODONE HCL 5 MG/5ML PO SOLN: 5.0000 mg | Freq: Once | ORAL | Status: AC | PRN

## 2013-08-06 MED ORDER — HYDROMORPHONE HCL PF 1 MG/ML IJ SOLN
0.5000 mg | INTRAMUSCULAR | Status: AC | PRN
  Administered 2013-08-06 (×4): 0.5 mg via INTRAVENOUS

## 2013-08-06 MED ORDER — DEXTROSE 5 % IV SOLN
INTRAVENOUS | Status: DC | PRN
  Administered 2013-08-06: 10:00:00 via INTRAVENOUS

## 2013-08-06 MED ORDER — PHENOL 1.4 % MT LIQD: 1.0000 | OROMUCOSAL | Status: DC | PRN

## 2013-08-06 MED ORDER — DIPHENHYDRAMINE HCL 50 MG PO CAPS
100.0000 mg | ORAL_CAPSULE | Freq: Two times a day (BID) | ORAL | Status: DC
  Administered 2013-08-06: 100 mg via ORAL
  Filled 2013-08-06 (×3): qty 2

## 2013-08-06 MED ORDER — OXYCODONE HCL 5 MG PO TABS
5.0000 mg | ORAL_TABLET | Freq: Once | ORAL | Status: AC | PRN
  Administered 2013-08-06: 5 mg via ORAL

## 2013-08-06 MED ORDER — BACITRACIN ZINC 500 UNIT/GM EX OINT
TOPICAL_OINTMENT | CUTANEOUS | Status: DC | PRN
  Administered 2013-08-06: 1 via TOPICAL

## 2013-08-06 MED ORDER — BUPIVACAINE-EPINEPHRINE PF 0.5-1:200000 % IJ SOLN
INTRAMUSCULAR | Status: DC | PRN
  Administered 2013-08-06: 10 mL

## 2013-08-06 MED ORDER — ONDANSETRON HCL 4 MG/2ML IJ SOLN
INTRAMUSCULAR | Status: DC | PRN
  Administered 2013-08-06: 4 mg via INTRAVENOUS

## 2013-08-06 MED ORDER — DEXAMETHASONE SODIUM PHOSPHATE 10 MG/ML IJ SOLN
INTRAMUSCULAR | Status: DC | PRN
  Administered 2013-08-06: 10 mg via INTRAVENOUS

## 2013-08-06 MED ORDER — ARTIFICIAL TEARS OP OINT
TOPICAL_OINTMENT | OPHTHALMIC | Status: DC | PRN
  Administered 2013-08-06: 1 via OPHTHALMIC

## 2013-08-06 MED ORDER — ALUM & MAG HYDROXIDE-SIMETH 200-200-20 MG/5ML PO SUSP: 30.0000 mL | Freq: Four times a day (QID) | ORAL | Status: DC | PRN

## 2013-08-06 MED ORDER — HEMOSTATIC AGENTS (NO CHARGE) OPTIME
TOPICAL | Status: DC | PRN
  Administered 2013-08-06: 1 via TOPICAL

## 2013-08-06 SURGICAL SUPPLY — 57 items
APL SKNCLS STERI-STRIP NONHPOA (GAUZE/BANDAGES/DRESSINGS) ×1
APL SRG 60D 8 XTD TIP BNDBL (TIP) ×1
BAG DECANTER FOR FLEXI CONT (MISCELLANEOUS) ×2 IMPLANT
BENZOIN TINCTURE PRP APPL 2/3 (GAUZE/BANDAGES/DRESSINGS) ×2 IMPLANT
BIT DRILL NEURO 2X3.1 SFT TUCH (MISCELLANEOUS) IMPLANT
BLADE SURG ROTATE 9660 (MISCELLANEOUS) IMPLANT
BRUSH SCRUB EZ PLAIN DRY (MISCELLANEOUS) ×2 IMPLANT
BUR ACORN 6.0 (BURR) ×2 IMPLANT
BUR MATCHSTICK NEURO 3.0 LAGG (BURR) ×2 IMPLANT
CANISTER SUCT 3000ML (MISCELLANEOUS) ×2 IMPLANT
CONT SPEC 4OZ CLIKSEAL STRL BL (MISCELLANEOUS) ×2 IMPLANT
DRAPE LAPAROTOMY 100X72X124 (DRAPES) ×2 IMPLANT
DRAPE MICROSCOPE LEICA (MISCELLANEOUS) ×2 IMPLANT
DRAPE POUCH INSTRU U-SHP 10X18 (DRAPES) ×2 IMPLANT
DRAPE SURG 17X23 STRL (DRAPES) ×8 IMPLANT
DRILL NEURO 2X3.1 SOFT TOUCH (MISCELLANEOUS) ×2
DURASEAL APPLICATOR TIP (TIP) ×1 IMPLANT
DURASEAL SPINE SEALANT 3ML (MISCELLANEOUS) ×1 IMPLANT
ELECT BLADE 4.0 EZ CLEAN MEGAD (MISCELLANEOUS) ×2
ELECT REM PT RETURN 9FT ADLT (ELECTROSURGICAL) ×2
ELECTRODE BLDE 4.0 EZ CLN MEGD (MISCELLANEOUS) ×1 IMPLANT
ELECTRODE REM PT RTRN 9FT ADLT (ELECTROSURGICAL) ×1 IMPLANT
GAUZE SPONGE 4X4 16PLY XRAY LF (GAUZE/BANDAGES/DRESSINGS) IMPLANT
GLOVE BIO SURGEON STRL SZ8.5 (GLOVE) ×2 IMPLANT
GLOVE EXAM NITRILE LRG STRL (GLOVE) IMPLANT
GLOVE EXAM NITRILE MD LF STRL (GLOVE) IMPLANT
GLOVE EXAM NITRILE XL STR (GLOVE) IMPLANT
GLOVE EXAM NITRILE XS STR PU (GLOVE) IMPLANT
GLOVE SS BIOGEL STRL SZ 8 (GLOVE) ×1 IMPLANT
GLOVE SUPERSENSE BIOGEL SZ 8 (GLOVE) ×1
GOWN BRE IMP SLV AUR LG STRL (GOWN DISPOSABLE) IMPLANT
GOWN BRE IMP SLV AUR XL STRL (GOWN DISPOSABLE) ×2 IMPLANT
GOWN STRL REIN 2XL LVL4 (GOWN DISPOSABLE) IMPLANT
KIT BASIN OR (CUSTOM PROCEDURE TRAY) ×2 IMPLANT
KIT ROOM TURNOVER OR (KITS) ×2 IMPLANT
NDL HYPO 21X1.5 SAFETY (NEEDLE) IMPLANT
NEEDLE HYPO 21X1.5 SAFETY (NEEDLE) IMPLANT
NEEDLE HYPO 22GX1.5 SAFETY (NEEDLE) ×2 IMPLANT
NS IRRIG 1000ML POUR BTL (IV SOLUTION) ×2 IMPLANT
PACK LAMINECTOMY NEURO (CUSTOM PROCEDURE TRAY) ×2 IMPLANT
PAD ARMBOARD 7.5X6 YLW CONV (MISCELLANEOUS) ×6 IMPLANT
PATTIES SURGICAL .5 X1 (DISPOSABLE) IMPLANT
RUBBERBAND STERILE (MISCELLANEOUS) ×4 IMPLANT
SPONGE GAUZE 4X4 12PLY (GAUZE/BANDAGES/DRESSINGS) ×2 IMPLANT
SPONGE SURGIFOAM ABS GEL SZ50 (HEMOSTASIS) ×2 IMPLANT
STRIP CLOSURE SKIN 1/2X4 (GAUZE/BANDAGES/DRESSINGS) ×2 IMPLANT
SUT ETHILON 2 0 FS 18 (SUTURE) ×1 IMPLANT
SUT VIC AB 1 CT1 18XBRD ANBCTR (SUTURE) ×1 IMPLANT
SUT VIC AB 1 CT1 8-18 (SUTURE) ×2
SUT VIC AB 2-0 CP2 18 (SUTURE) ×2 IMPLANT
SYR 20CC LL (SYRINGE) IMPLANT
SYR 20ML ECCENTRIC (SYRINGE) ×2 IMPLANT
TAPE CLOTH SURG 4X10 WHT LF (GAUZE/BANDAGES/DRESSINGS) ×1 IMPLANT
TAPE STRIPS DRAPE STRL (GAUZE/BANDAGES/DRESSINGS) ×1 IMPLANT
TOWEL OR 17X24 6PK STRL BLUE (TOWEL DISPOSABLE) ×2 IMPLANT
TOWEL OR 17X26 10 PK STRL BLUE (TOWEL DISPOSABLE) ×2 IMPLANT
WATER STERILE IRR 1000ML POUR (IV SOLUTION) ×2 IMPLANT

## 2013-08-06 NOTE — H&P (Signed)
Subjective: The patient is a 62 year old white female who's had multiple prior back surgeries. He has had chronic back pain. More recently she's developed increasing left buttock and left leg pain consistent with left S1 radiculopathy. She has failed medical management and was worked up with a lumbar MRI. This demonstrated a herniated disc at L5-S1 on the left. I discussed the various treatment with the patient including surgery. She has weighed the risks, benefits, and alternatives surgery and decided proceed with a left L5-S1 discectomy.   Past Medical History  Diagnosis Date  . Arthritis     Past Surgical History  Procedure Laterality Date  . Ankle surgery Right     right fusion  . Back surgery      5 surgeries at [facility]  . Tonsillectomy    . Tubal ligation  1982  . Cholecystectomy    . Carpal tunnel release Bilateral   . Knee surgery Left   . Colonoscopy      No Known Allergies  History  Substance Use Topics  . Smoking status: Never Smoker   . Smokeless tobacco: Not on file  . Alcohol Use: No    History reviewed. No pertinent family history. Prior to Admission medications   Medication Sig Start Date End Date Taking? Authorizing Provider  diclofenac sodium (VOLTAREN) 1 % GEL Apply 4 g topically daily as needed (knee pain).   Yes Historical Provider, MD  diphenhydrAMINE (SOMINEX) 25 MG tablet Take 100 mg by mouth 2 (two) times daily.   Yes Historical Provider, MD  gabapentin (NEURONTIN) 300 MG capsule Take 600 mg by mouth every 8 (eight) hours.   Yes Historical Provider, MD  loratadine (CLARITIN) 10 MG tablet Take 10 mg by mouth daily as needed for allergies.   Yes Historical Provider, MD  Multiple Vitamins-Minerals (MULTIVITAMIN WITH MINERALS) tablet Take 1 tablet by mouth daily.   Yes Historical Provider, MD  OVER THE COUNTER MEDICATION Take 2 capsules by mouth daily. Cartilast with ASU   Yes Historical Provider, MD  oxyCODONE-acetaminophen (PERCOCET) 10-325 MG per tablet Take  1 tablet by mouth every 6 (six) hours.   Yes Historical Provider, MD  Tapentadol HCl (NUCYNTA ER) 200 MG TB12 Take 200 mg by mouth 2 (two) times daily.   Yes Historical Provider, MD     Review of Systems  Positive ROS: As above  All other systems have been reviewed and were otherwise negative with the exception of those mentioned in the HPI and as above.  Objective: Vital signs in last 24 hours: Temp:  [97.6 F (36.4 C)] 97.6 F (36.4 C) (10/23 0808) Pulse Rate:  [57] 57 (10/23 0808) Resp:  [18] 18 (10/23 0808) BP: (185)/(80) 185/80 mmHg (10/23 0808) SpO2:  [98 %] 98 % (10/23 0808)  General Appearance: Alert, cooperative, no distress, appears stated age Head: Normocephalic, without obvious abnormality, atraumatic Eyes: PERRL, conjunctiva/corneas clear, EOM's intact, fundi benign, both eyes      Ears: Normal TM's and external ear canals, both ears Throat: Lips, mucosa, and tongue normal; teeth and gums normal Neck: Supple, symmetrical, trachea midline, no adenopathy; thyroid: No enlargement/tenderness/nodules; no carotid bruit or JVD Back: Symmetric, no curvature, ROM normal, no CVA tenderness. The patient's lumbar incision is well-healed. Lungs: Clear to auscultation bilaterally, respirations unlabored Heart: Regular rate and rhythm, S1 and S2 normal, no murmur, rub or gallop Abdomen: Soft, non-tender, bowel sounds active all four quadrants, no masses, no organomegaly Extremities: Extremities normal, atraumatic, no cyanosis or edema Pulses: 2+ and symmetric  all extremities Skin: Skin color, texture, turgor normal, no rashes or lesions  NEUROLOGIC:   Mental status: alert and oriented, no aphasia, good attention span, Fund of knowledge/ memory ok Motor Exam - grossly normal Sensory Exam - grossly normal Reflexes:  Coordination - grossly normal Gait - grossly normal Balance - grossly normal Cranial Nerves: I: smell Not tested  II: visual acuity  OS: Normal    OD: Normal    II: visual fields Full to confrontation  II: pupils Equal, round, reactive to light  III,VII: ptosis None  III,IV,VI: extraocular muscles  Full ROM  V: mastication Normal  V: facial light touch sensation  Normal  V,VII: corneal reflex  Present  VII: facial muscle function - upper  Normal  VII: facial muscle function - lower Normal  VIII: hearing Not tested  IX: soft palate elevation  Normal  IX,X: gag reflex Present  XI: trapezius strength  5/5  XI: sternocleidomastoid strength 5/5  XI: neck flexion strength  5/5  XII: tongue strength  Normal    Data Review Lab Results  Component Value Date   WBC 6.2 07/31/2013   HGB 14.2 07/31/2013   HCT 41.3 07/31/2013   MCV 90.6 07/31/2013   PLT 213 07/31/2013   Lab Results  Component Value Date   NA 141 07/31/2013   K 4.0 07/31/2013   CL 100 07/31/2013   CO2 32 07/31/2013   BUN 12 07/31/2013   CREATININE 0.65 07/31/2013   GLUCOSE 80 07/31/2013   No results found for this basename: INR, PROTIME    Assessment/Plan: Left L5-S1 herniated disc, lumbago, lumbar radiculopathy: I discussed the situation with the patient. I reviewed her MR scan with her and pointed out the abnormalities. We have discussed the various treatment options including surgery. I described the surgical treatment option of of a left L5-S1 discectomy. I've shown her surgical models. We have discussed the risks, benefits, alternatives, and likelihood of achieving our goals with surgery. I have answered all the patient's questions. She has decided to proceed with surgery.   Vergil Burby D 08/06/2013 10:12 AM

## 2013-08-06 NOTE — Progress Notes (Signed)
Clear liquid noted from patient's back which saturated dressing and pads. Dr. Yetta Barre notified and ordered to reinforce dressing and monitor patient for headaches and any other symptoms.

## 2013-08-06 NOTE — Transfer of Care (Signed)
Immediate Anesthesia Transfer of Care Note  Patient: Kathryn [name]  Procedure(s) Performed: Procedure(s) with comments: LUMBAR LAMINECTOMY/DECOMPRESSION MICRODISCECTOMY 1 LEVEL (Left) - redo LEFT Lumbar Five-Sacral One diskectomy  Patient Location: PACU  Anesthesia Type:General  Level of Consciousness: awake, alert , oriented and patient cooperative  Airway & Oxygen Therapy: Patient Spontanous Breathing and Patient connected to nasal cannula oxygen  Post-op Assessment: Report given to PACU RN and Post -op Vital signs reviewed and stable  Post vital signs: Reviewed and stable  Complications: No apparent anesthesia complications

## 2013-08-06 NOTE — Preoperative (Signed)
Beta Blockers   Reason not to administer Beta Blockers:Not Applicable 

## 2013-08-06 NOTE — Progress Notes (Signed)
Patient ID: Kathryn [name], female   DOB: 1951-03-03, 62 y.o.   MRN: [id number] Patient complaining of left leg pain after decompressive surgery today. He had an unintended durotomy at the time of surgery. She has some leakage of clear fluid from her incision tonight. I have spoken to Dr. Lovell Sheehan and he would like to reinforce the dressing and consider placement of a lumbar drain tomorrow. I will treat with anti-inflammatory medications, Decadron, and a PCA in hopes of controlling her pain.

## 2013-08-06 NOTE — Progress Notes (Signed)
Patient ID: Kathryn [name], female   DOB: 1951-05-29, 62 y.o.   MRN: [id number] Subjective:  The patient is alert and pleasant. She looks well. She is in no apparent distress. She denies headaches. She complains of some left leg pain.  Objective: Vital signs in last 24 hours: Temp:  [96.9 F (36.1 C)-97.6 F (36.4 C)] 96.9 F (36.1 C) (10/23 1248) Pulse Rate:  [57-67] 67 (10/23 1415) Resp:  [10-18] 13 (10/23 1415) BP: (154-185)/(75-103) 163/95 mmHg (10/23 1408) SpO2:  [93 %-100 %] 99 % (10/23 1415)  Intake/Output from previous day:   Intake/Output this shift: Total I/O In: 1600 [I.V.:1600] Out: 470 [Urine:450; Blood:20]  Physical exam the patient's motor strength is 5 over 5 in her bile dorsiflexors and gastrocnemius  Lab Results: No results found for this basename: WBC, HGB, HCT, PLT,  in the last 72 hours BMET No results found for this basename: NA, K, CL, CO2, GLUCOSE, BUN, CREATININE, CALCIUM,  in the last 72 hours  Studies/Results: Dg Lumbar Spine 1 View  08/06/2013   CLINICAL DATA:  Lateral lumbar view for surgical localization  EXAM: LUMBAR SPINE - 1 VIEW  COMPARISON:  Lumbar MRI, 06/22/2013  FINDINGS: A surgical probe has been inserted through skin retractors. Lies posterior to the L5-S1 disc interspace.  Intervertebral cages partly maintained disc height at L4-L5 stable from the prior MRI.  IMPRESSION: Localization image shows the surgical probes to lie posterior to the L5-S1 disc.   Electronically Signed   By: Amie Portland M.D.   On: 08/06/2013 14:08    Assessment/Plan: The patient is doing well.  LOS: 0 days     Fremon Zacharia D 08/06/2013, 2:40 PM

## 2013-08-06 NOTE — Op Note (Signed)
Brief history: The patient is a 62 year old white female who's had 5 prior back surgeries. She has a chronic back pain. More recently she's developed a recurrent left lumbar radiculopathy. She was worked up with a lumbar MRI which demonstrated a herniated disc/spondylosis at L5-S1 on the left. Her symptoms seem consistent with a left S1 radiculopathy. I have discussed the various treatment option with the patient including surgery. She has weighed the risks, benefits, and alternatives surgery and decided proceed with a left L5-S1 laminotomy foraminotomy/discectomy.  Preoperative diagnosis: Left L5-S1 disc degeneration, spondylosis, stenosis, lumbar radiculopathy, lumbago  Postoperative diagnosis: The same  Procedure: After L5-S1 redo laminotomy foraminotomy using micro-dissection  Surgeon: Dr. Delma Officer  Asst.: Dr. Marikay Alar  Anesthesia: Gen. endotracheal  Estimated blood loss: Minimal  Drains: None  Complications: Durotomy  Description of procedure: The patient was brought to the operating room by the anesthesia team. General endotracheal anesthesia was induced. The patient was turned to the prone position on the Wilson frame. The patient's lumbosacral region was then prepared with Betadine scrub and Betadine solution. Sterile drapes were applied.  I then injected the area to be incised with Marcaine with epinephrine solution. I then used a scalpel to make a linear midline incision over the L5-S1 intervertebral disc space, incising through the previous surgical scar. I then used electrocautery to perform a left sided subperiosteal dissection exposing the spinous process and lamina of the upper sacrum and the L5-S1 facet. We obtained intraoperative radiograph to confirm our location. I then inserted the [facility] retractor for exposure.  We then brought the operative microscope into the field. Under its magnification and illumination we completed the microdissection. I used a high-speed  drill to widen the prior laminectomy at L5 on the left. There was quite a bit of scar tissue. I then used a Kerrison punches to widen the laminotomy and removed the epidural fibrosis at L5-S1 on the left. We then used microdissection to free up the thecal sac and the left S1 nerve root from the epidural tissue. I then used a Kerrison punch to perform a foraminotomy at about the S1 nerve root.  The left S1 nerve root was quite scarred down.he was adherent to a bone spur at the disc space at L5-S1 on the left. In dissecting it free we did see a splash of spinal fluid. Inspected the dura but could not see any holes. Obviously the holes along the ventral surface of the dura just cephalad to the L5-S1 disc space. We then using the nerve root retractor to gently retract the thecal sac and the S1 nerve root medially. This exposed the intervertebral disc/bone spur. Dr. Yetta Barre carefully retracted the thecal sac and S1 nerve root medially. I attempted to remove the bone spur using the pituitary forceps/osteophyte tool. Ultimately used a high-speed drill to drill the bone spur down.  I then palpated along the ventral surface of the thecal sac and along exit route of the S1 nerve root and noted that the neural structures were well decompressed. This completed the decompression.  We then I inspected the thecal sac for any holes we cannot see any holes in the dura but continued to intermittently get him fluid leakage from the ventral surface of the dura. I therefore placed a piece of Gelfoam actually along the surface of the dura where I thought the hole was. I then placed a DuraSeal over the exposed dura. I do not see any further spinal fluid leakage.  We then obtained hemostasis using  bipolar electrocautery. We irrigated the wound out with bacitracin solution. We then removed the retractor. We then reapproximated the patient's thoracolumbar fascia with interrupted #1 Vicryl suture. We then reapproximated the patient's  subcutaneous tissue with interrupted 2-0 Vicryl suture. We then reapproximated patient's skin with a running 2-0 nylon suture. The was then coated with bacitracin ointment. The drapes were removed. The patient was subsequently returned to the supine position where they were extubated by the anesthesia team. The patient was then transported to the postanesthesia care unit in stable condition. All sponge instrument and needle counts were reportedly correct at the end of this case.

## 2013-08-06 NOTE — Progress Notes (Addendum)
Pt complains of left hip pain down to her left leg. Pt rated the pain 10/10. Pain medications given as ordered without relief. Pt back dressing saturated with clear liquid. MD on-call (Dr. Yetta Barre) aware and orders made. Dressing reinforced as ordered. Will continue to monitor pt closely.

## 2013-08-06 NOTE — Anesthesia Preprocedure Evaluation (Addendum)
Anesthesia Evaluation  Patient identified by MRN, date of birth, ID band Patient awake    Reviewed: Allergy & Precautions, H&P , NPO status , Patient's Chart, lab work & pertinent test results  History of Anesthesia Complications Negative for: history of anesthetic complications  Airway Mallampati: II TM Distance: >3 FB Neck ROM: Full    Dental  (+) Dental Advisory Given, Teeth Intact, Chipped and Caps,    Pulmonary neg pulmonary ROS,  breath sounds clear to auscultation  Pulmonary exam normal       Cardiovascular Exercise Tolerance: Good - anginanegative cardio ROS  Rhythm:Regular Rate:Normal     Neuro/Psych Chronic back pain: narcotics    GI/Hepatic negative GI ROS, Neg liver ROS,   Endo/Other  Morbid obesity  Renal/GU negative Renal ROS     Musculoskeletal  (+) Arthritis -, Osteoarthritis,    Abdominal (+) + obese,   Peds  Hematology negative hematology ROS (+)   Anesthesia Other Findings   Reproductive/Obstetrics negative OB ROS                         Anesthesia Physical Anesthesia Plan  ASA: III  Anesthesia Plan: General   Post-op Pain Management:    Induction: Intravenous  Airway Management Planned: Oral ETT  Additional Equipment:   Intra-op Plan:   Post-operative Plan: Extubation in OR  Informed Consent: I have reviewed the patients History and Physical, chart, labs and discussed the procedure including the risks, benefits and alternatives for the proposed anesthesia with the patient or authorized representative who has indicated his/her understanding and acceptance.   Dental advisory given  Plan Discussed with: CRNA and Surgeon  Anesthesia Plan Comments: (Plan routine monitors, GETA)        Anesthesia Quick Evaluation

## 2013-08-06 NOTE — Anesthesia Procedure Notes (Signed)
Procedure Name: Intubation Date/Time: 08/06/2013 10:34 AM Performed by: Tyrone Nine Pre-anesthesia Checklist: Patient identified, Timeout performed, Emergency Drugs available, Suction available and Patient being monitored Patient Re-evaluated:Patient Re-evaluated prior to inductionOxygen Delivery Method: Circle system utilized Preoxygenation: Pre-oxygenation with 100% oxygen Intubation Type: IV induction Ventilation: Mask ventilation without difficulty and Oral airway inserted - appropriate to patient size Laryngoscope Size: Mac and 3 Grade View: Grade II Tube type: Oral Tube size: 7.5 mm Number of attempts: 1 Airway Equipment and Method: Stylet Placement Confirmation: ETT inserted through vocal cords under direct vision,  breath sounds checked- equal and bilateral,  positive ETCO2 and CO2 detector Secured at: 22 cm Tube secured with: Tape Dental Injury: Teeth and Oropharynx as per pre-operative assessment

## 2013-08-06 NOTE — Anesthesia Postprocedure Evaluation (Signed)
  Anesthesia Post-op Note  Patient: Kathryn [name]  Procedure(s) Performed: Procedure(s) with comments: LUMBAR LAMINECTOMY/DECOMPRESSION MICRODISCECTOMY 1 LEVEL (Left) - redo LEFT Lumbar Five-Sacral One diskectomy  Patient Location: PACU  Anesthesia Type:General  Level of Consciousness: awake, alert , oriented and patient cooperative  Airway and Oxygen Therapy: Patient Spontanous Breathing and Patient connected to nasal cannula oxygen  Post-op Pain: mild  Post-op Assessment: Post-op Vital signs reviewed, Patient's Cardiovascular Status Stable, Respiratory Function Stable, Patent Airway, No signs of Nausea or vomiting and Pain level controlled  Post-op Vital Signs: Reviewed and stable  Complications: No apparent anesthesia complications

## 2013-08-07 ENCOUNTER — Encounter (HOSPITAL_COMMUNITY): Payer: Self-pay | Admitting: Neurosurgery

## 2013-08-07 ENCOUNTER — Inpatient Hospital Stay (HOSPITAL_COMMUNITY): Payer: Medicare Other

## 2013-08-07 ENCOUNTER — Encounter (HOSPITAL_COMMUNITY): Payer: Medicare Other | Admitting: Anesthesiology

## 2013-08-07 ENCOUNTER — Encounter (HOSPITAL_COMMUNITY)
Admission: RE | Disposition: A | Discharge status: Home / Self Care | Payer: Self-pay | Source: Ambulatory Visit | Attending: Neurosurgery

## 2013-08-07 ENCOUNTER — Inpatient Hospital Stay (HOSPITAL_COMMUNITY): Payer: Medicare Other | Admitting: Anesthesiology

## 2013-08-07 HISTORY — PX: PLACEMENT OF LUMBAR DRAIN: SHX6028

## 2013-08-07 SURGERY — PLACEMENT OF LUMBAR DRAIN
Anesthesia: Monitor Anesthesia Care | Site: Back | Wound class: Clean

## 2013-08-07 MED ORDER — HYDROMORPHONE HCL PF 1 MG/ML IJ SOLN
INTRAMUSCULAR | Status: AC
  Filled 2013-08-07: qty 1

## 2013-08-07 MED ORDER — MIDAZOLAM HCL 5 MG/5ML IJ SOLN
INTRAMUSCULAR | Status: DC | PRN
  Administered 2013-08-07: 2 mg via INTRAVENOUS

## 2013-08-07 MED ORDER — ONDANSETRON HCL 4 MG/2ML IJ SOLN
4.0000 mg | Freq: Four times a day (QID) | INTRAMUSCULAR | Status: DC | PRN
  Filled 2013-08-07: qty 2

## 2013-08-07 MED ORDER — LACTATED RINGERS IV SOLN
INTRAVENOUS | Status: DC | PRN
  Administered 2013-08-07 (×2): via INTRAVENOUS

## 2013-08-07 MED ORDER — MORPHINE SULFATE (PF) 1 MG/ML IV SOLN
INTRAVENOUS | Status: DC
  Administered 2013-08-07: 14.61 mg via INTRAVENOUS
  Administered 2013-08-07 – 2013-08-08 (×2): via INTRAVENOUS
  Administered 2013-08-08: 4.5 mg via INTRAVENOUS
  Administered 2013-08-08: 6 mg via INTRAVENOUS
  Administered 2013-08-08: 28.5 mg via INTRAVENOUS
  Administered 2013-08-08: 16.41 mg via INTRAVENOUS
  Administered 2013-08-09: 10:00:00 via INTRAVENOUS
  Administered 2013-08-09: 7.5 mg via INTRAVENOUS
  Administered 2013-08-09: 3 mg via INTRAVENOUS
  Administered 2013-08-09: 31.19 mg via INTRAVENOUS
  Administered 2013-08-09 – 2013-08-10 (×2): 1.5 mg via INTRAVENOUS
  Administered 2013-08-10: 08:00:00 via INTRAVENOUS
  Administered 2013-08-10: 17.43 mg via INTRAVENOUS
  Administered 2013-08-10: 1.5 mg via INTRAVENOUS
  Administered 2013-08-10: 7.5 mg via INTRAVENOUS
  Filled 2013-08-07 (×7): qty 25

## 2013-08-07 MED ORDER — ACETAMINOPHEN 650 MG RE SUPP: 650.0000 mg | RECTAL | Status: DC | PRN

## 2013-08-07 MED ORDER — DIPHENHYDRAMINE HCL 12.5 MG/5ML PO ELIX: 12.5000 mg | ORAL_SOLUTION | Freq: Four times a day (QID) | ORAL | Status: DC | PRN

## 2013-08-07 MED ORDER — LACTATED RINGERS IV SOLN
INTRAVENOUS | Status: DC
  Administered 2013-08-07: 11:00:00 via INTRAVENOUS

## 2013-08-07 MED ORDER — GLYCOPYRROLATE 0.2 MG/ML IJ SOLN
INTRAMUSCULAR | Status: DC | PRN
  Administered 2013-08-07: 0.4 mg via INTRAVENOUS

## 2013-08-07 MED ORDER — ROCURONIUM BROMIDE 100 MG/10ML IV SOLN
INTRAVENOUS | Status: DC | PRN
  Administered 2013-08-07: 35 mg via INTRAVENOUS

## 2013-08-07 MED ORDER — ONDANSETRON HCL 4 MG/2ML IJ SOLN
4.0000 mg | INTRAMUSCULAR | Status: DC | PRN
  Administered 2013-08-08 – 2013-08-09 (×5): 4 mg via INTRAVENOUS
  Filled 2013-08-07 (×4): qty 2

## 2013-08-07 MED ORDER — CEFAZOLIN SODIUM-DEXTROSE 2-3 GM-% IV SOLR
INTRAVENOUS | Status: AC
  Administered 2013-08-07: 2 g via INTRAVENOUS
  Filled 2013-08-07: qty 50

## 2013-08-07 MED ORDER — FENTANYL CITRATE 0.05 MG/ML IJ SOLN
INTRAMUSCULAR | Status: DC | PRN
  Administered 2013-08-07 (×2): 50 ug via INTRAVENOUS

## 2013-08-07 MED ORDER — ACETAMINOPHEN 325 MG PO TABS: 650.0000 mg | ORAL_TABLET | ORAL | Status: DC | PRN

## 2013-08-07 MED ORDER — HYDROCODONE-ACETAMINOPHEN 5-325 MG PO TABS
1.0000 | ORAL_TABLET | ORAL | Status: DC | PRN
  Filled 2013-08-07: qty 1

## 2013-08-07 MED ORDER — DIAZEPAM 5 MG PO TABS
5.0000 mg | ORAL_TABLET | Freq: Four times a day (QID) | ORAL | Status: DC | PRN
  Administered 2013-08-08: 5 mg via ORAL
  Filled 2013-08-07: qty 1

## 2013-08-07 MED ORDER — SODIUM CHLORIDE 0.9 % IJ SOLN: 9.0000 mL | INTRAMUSCULAR | Status: DC | PRN

## 2013-08-07 MED ORDER — ONDANSETRON HCL 4 MG/2ML IJ SOLN
4.0000 mg | Freq: Once | INTRAMUSCULAR | Status: DC | PRN
Start: 2013-08-07 — End: 2013-08-07

## 2013-08-07 MED ORDER — ALUM & MAG HYDROXIDE-SIMETH 200-200-20 MG/5ML PO SUSP: 30.0000 mL | Freq: Four times a day (QID) | ORAL | Status: DC | PRN

## 2013-08-07 MED ORDER — MENTHOL 3 MG MT LOZG: 1.0000 | LOZENGE | OROMUCOSAL | Status: DC | PRN

## 2013-08-07 MED ORDER — CEFAZOLIN SODIUM-DEXTROSE 2-3 GM-% IV SOLR
2.0000 g | Freq: Three times a day (TID) | INTRAVENOUS | Status: AC
  Administered 2013-08-07 – 2013-08-08 (×2): 2 g via INTRAVENOUS
  Filled 2013-08-07 (×2): qty 50

## 2013-08-07 MED ORDER — HYDROMORPHONE HCL PF 1 MG/ML IJ SOLN
0.2500 mg | INTRAMUSCULAR | Status: DC | PRN
  Administered 2013-08-07 (×4): 0.5 mg via INTRAVENOUS

## 2013-08-07 MED ORDER — 0.9 % SODIUM CHLORIDE (POUR BTL) OPTIME
TOPICAL | Status: DC | PRN
  Administered 2013-08-07: 1000 mL

## 2013-08-07 MED ORDER — PHENOL 1.4 % MT LIQD: 1.0000 | OROMUCOSAL | Status: DC | PRN

## 2013-08-07 MED ORDER — MORPHINE SULFATE 2 MG/ML IJ SOLN: 1.0000 mg | INTRAMUSCULAR | Status: DC | PRN

## 2013-08-07 MED ORDER — NALOXONE HCL 0.4 MG/ML IJ SOLN: 0.4000 mg | INTRAMUSCULAR | Status: DC | PRN

## 2013-08-07 MED ORDER — DIPHENHYDRAMINE HCL 50 MG/ML IJ SOLN
12.5000 mg | Freq: Four times a day (QID) | INTRAMUSCULAR | Status: DC | PRN
  Administered 2013-08-07 – 2013-08-09 (×3): 12.5 mg via INTRAVENOUS
  Filled 2013-08-07 (×3): qty 1

## 2013-08-07 MED ORDER — NEOSTIGMINE METHYLSULFATE 1 MG/ML IJ SOLN
INTRAMUSCULAR | Status: DC | PRN
  Administered 2013-08-07: 2 mg via INTRAVENOUS

## 2013-08-07 MED ORDER — SODIUM CHLORIDE 0.9 % IR SOLN
Status: DC | PRN
  Administered 2013-08-07: 13:00:00

## 2013-08-07 MED ORDER — OXYCODONE-ACETAMINOPHEN 5-325 MG PO TABS
1.0000 | ORAL_TABLET | ORAL | Status: DC | PRN
  Administered 2013-08-08 – 2013-08-11 (×7): 2 via ORAL
  Administered 2013-08-12 (×2): 1 via ORAL
  Filled 2013-08-07: qty 1
  Filled 2013-08-07: qty 2
  Filled 2013-08-07: qty 1
  Filled 2013-08-07 (×6): qty 2

## 2013-08-07 MED ORDER — PROPOFOL 10 MG/ML IV BOLUS
INTRAVENOUS | Status: DC | PRN
  Administered 2013-08-07: 160 mg via INTRAVENOUS
  Administered 2013-08-07: 40 mg via INTRAVENOUS

## 2013-08-07 MED ORDER — LACTATED RINGERS IV SOLN
INTRAVENOUS | Status: DC
  Administered 2013-08-07 – 2013-08-11 (×5): via INTRAVENOUS

## 2013-08-07 MED ORDER — DOCUSATE SODIUM 100 MG PO CAPS
100.0000 mg | ORAL_CAPSULE | Freq: Two times a day (BID) | ORAL | Status: DC
  Administered 2013-08-07 – 2013-08-12 (×9): 100 mg via ORAL
  Filled 2013-08-07 (×8): qty 1

## 2013-08-07 MED ORDER — LIDOCAINE HCL (CARDIAC) 20 MG/ML IV SOLN
INTRAVENOUS | Status: DC | PRN
  Administered 2013-08-07: 60 mg via INTRAVENOUS

## 2013-08-07 SURGICAL SUPPLY — 67 items
0.5% SENSORCAINE WITH EPI 1:200,000 (MPF) IMPLANT
APL SKNCLS STERI-STRIP NONHPOA (GAUZE/BANDAGES/DRESSINGS) ×1
BENZOIN TINCTURE PRP APPL 2/3 (GAUZE/BANDAGES/DRESSINGS) ×1 IMPLANT
BLADE SURG 10 STRL SS (BLADE) ×4 IMPLANT
BLADE SURG 15 STRL LF DISP TIS (BLADE) ×1 IMPLANT
BLADE SURG 15 STRL SS (BLADE) ×2
BLADE SURG ROTATE 9660 (MISCELLANEOUS) ×2 IMPLANT
BOOT SUTURE AID YELLOW STND (SUTURE) IMPLANT
CANISTER SUCT 3000ML (MISCELLANEOUS) ×2 IMPLANT
CATH VENTRIC 35X38 W/TROCAR LG (CATHETERS) IMPLANT
CONT SPEC 4OZ CLIKSEAL STRL BL (MISCELLANEOUS) ×1 IMPLANT
CORDS BIPOLAR (ELECTRODE) ×2 IMPLANT
COVER MAYO STAND STRL (DRAPES) ×1 IMPLANT
COVER SURGICAL LIGHT HANDLE (MISCELLANEOUS) ×1 IMPLANT
COVER TABLE BACK 60X90 (DRAPES) ×3 IMPLANT
DRAIN SUBARACHNOID (WOUND CARE) ×1 IMPLANT
DRAPE INCISE IOBAN 66X45 STRL (DRAPES) ×2 IMPLANT
DRAPE LAPAROTOMY T 102X78X121 (DRAPES) ×1 IMPLANT
DRAPE POUCH INSTRU U-SHP 10X18 (DRAPES) ×2 IMPLANT
DRAPE SURG 17X23 STRL (DRAPES) ×10 IMPLANT
ELECT BLADE 4.0 EZ CLEAN MEGAD (MISCELLANEOUS) ×2
ELECT CAUTERY BLADE 6.4 (BLADE) ×1 IMPLANT
ELECT REM PT RETURN 9FT ADLT (ELECTROSURGICAL) ×2
ELECTRODE BLDE 4.0 EZ CLN MEGD (MISCELLANEOUS) IMPLANT
ELECTRODE REM PT RTRN 9FT ADLT (ELECTROSURGICAL) ×1 IMPLANT
GAUZE SPONGE 4X4 16PLY XRAY LF (GAUZE/BANDAGES/DRESSINGS) ×2 IMPLANT
GLOVE BIO SURGEON STRL SZ8.5 (GLOVE) ×2 IMPLANT
GLOVE BIOGEL PI IND STRL 7.0 (GLOVE) IMPLANT
GLOVE BIOGEL PI IND STRL 7.5 (GLOVE) IMPLANT
GLOVE BIOGEL PI INDICATOR 7.0 (GLOVE) ×1
GLOVE BIOGEL PI INDICATOR 7.5 (GLOVE) ×1
GLOVE EXAM NITRILE LRG STRL (GLOVE) IMPLANT
GLOVE EXAM NITRILE MD LF STRL (GLOVE) IMPLANT
GLOVE EXAM NITRILE XL STR (GLOVE) IMPLANT
GLOVE EXAM NITRILE XS STR PU (GLOVE) IMPLANT
GLOVE SS BIOGEL STRL SZ 8 (GLOVE) ×1 IMPLANT
GLOVE SUPERSENSE BIOGEL SZ 8 (GLOVE) ×1
GLOVE SURG SS PI 7.0 STRL IVOR (GLOVE) ×2 IMPLANT
GOWN BRE IMP SLV AUR LG STRL (GOWN DISPOSABLE) ×1 IMPLANT
GOWN BRE IMP SLV AUR XL STRL (GOWN DISPOSABLE) ×2 IMPLANT
KIT BASIN OR (CUSTOM PROCEDURE TRAY) ×2 IMPLANT
KIT DRAIN CSF ACCUDRAIN (MISCELLANEOUS) IMPLANT
KIT ROOM TURNOVER OR (KITS) ×2 IMPLANT
NEEDLE HYPO 22GX1.5 SAFETY (NEEDLE) ×2 IMPLANT
NS IRRIG 1000ML POUR BTL (IV SOLUTION) ×2 IMPLANT
PACK EENT II TURBAN DRAPE (CUSTOM PROCEDURE TRAY) ×2 IMPLANT
PAD ARMBOARD 7.5X6 YLW CONV (MISCELLANEOUS) ×6 IMPLANT
PATTIES SURGICAL .5 X.5 (GAUZE/BANDAGES/DRESSINGS) ×1 IMPLANT
PATTIES SURGICAL 1X1 (DISPOSABLE) IMPLANT
PENCIL BUTTON HOLSTER BLD 10FT (ELECTRODE) ×2 IMPLANT
SPONGE GAUZE 4X4 12PLY (GAUZE/BANDAGES/DRESSINGS) ×1 IMPLANT
STAPLER SKIN PROX WIDE 3.9 (STAPLE) ×1 IMPLANT
STRIP CLOSURE SKIN 1/2X4 (GAUZE/BANDAGES/DRESSINGS) ×1 IMPLANT
SUT BONE WAX W31G (SUTURE) ×1 IMPLANT
SUT ETHILON 2 0 FS 18 (SUTURE) ×1 IMPLANT
SUT ETHILON 2 0 PSLX (SUTURE) ×1 IMPLANT
SUT VIC AB 2-0 CP2 18 (SUTURE) ×1 IMPLANT
SUT VIC AB 3-0 SH 8-18 (SUTURE) ×1 IMPLANT
SYR BULB 3OZ (MISCELLANEOUS) ×2 IMPLANT
SYR CONTROL 10ML LL (SYRINGE) ×3 IMPLANT
TAPE CLOTH SURG 4X10 WHT LF (GAUZE/BANDAGES/DRESSINGS) ×1 IMPLANT
TOWEL OR 17X24 6PK STRL BLUE (TOWEL DISPOSABLE) ×2 IMPLANT
TOWEL OR 17X26 10 PK STRL BLUE (TOWEL DISPOSABLE) ×2 IMPLANT
TRAY FOLEY CATH 14FRSI W/METER (CATHETERS) IMPLANT
TUBE CONNECTING 12X1/4 (SUCTIONS) ×2 IMPLANT
UNDERPAD 30X30 INCONTINENT (UNDERPADS AND DIAPERS) ×1 IMPLANT
WATER STERILE IRR 1000ML POUR (IV SOLUTION) ×2 IMPLANT

## 2013-08-07 NOTE — Progress Notes (Signed)
Patient ID: Kathryn [name], female   DOB: 1951/07/07, 62 y.o.   MRN: [id number] Subjective:  The patient is alert and pleasant. I spoke with her and her son. She's had back and leg pain. She denies headaches.  Objective: Vital signs in last 24 hours: Temp:  [96.9 F (36.1 C)-98.1 F (36.7 C)] 97.3 F (36.3 C) (10/24 0600) Pulse Rate:  [57-94] 84 (10/24 0600) Resp:  [9-22] 18 (10/24 0600) BP: (121-199)/(53-103) 159/71 mmHg (10/24 0600) SpO2:  [93 %-100 %] 98 % (10/24 0600) Weight:  [108.41 kg (239 lb)] 108.41 kg (239 lb) (10/23 1650)  Intake/Output from previous day: 10/23 0701 - 10/24 0700 In: 2000 [P.O.:400; I.V.:1600] Out: 1180 [Urine:1160; Blood:20] Intake/Output this shift:    Physical exam the patient is alert oriented. Her strength is normal. Her dressing has been reinforced and has some pink drainage.  Lab Results: No results found for this basename: WBC, HGB, HCT, PLT,  in the last 72 hours BMET No results found for this basename: NA, K, CL, CO2, GLUCOSE, BUN, CREATININE, CALCIUM,  in the last 72 hours  Studies/Results: Dg Lumbar Spine 1 View  08/06/2013   CLINICAL DATA:  Lateral lumbar view for surgical localization  EXAM: LUMBAR SPINE - 1 VIEW  COMPARISON:  Lumbar MRI, 06/22/2013  FINDINGS: A surgical probe has been inserted through skin retractors. Lies posterior to the L5-S1 disc interspace.  Intervertebral cages partly maintained disc height at L4-L5 stable from the prior MRI.  IMPRESSION: Localization image shows the surgical probes to lie posterior to the L5-S1 disc.   Electronically Signed   By: Amie Portland M.D.   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 I discussed situation with the patient and her son. I recommend placement of a lumbar drain as I think the durotomy is an patient will be difficult to close surgically. I explain the procedure of a lumbar drain and answered all their questions. She wants  to proceed with a drain. If this doesn't work I may need to explore her wound and try again to close the durotomy directly.  LOS: 1 day     Shalimar Mcclain D 08/07/2013, 8:04 AM

## 2013-08-07 NOTE — Anesthesia Procedure Notes (Signed)
Procedure Name: Intubation Date/Time: 08/07/2013 12:39 PM Performed by: Alanda Amass A Pre-anesthesia Checklist: Patient identified, Timeout performed, Emergency Drugs available, Suction available and Patient being monitored Patient Re-evaluated:Patient Re-evaluated prior to inductionOxygen Delivery Method: Circle system utilized Preoxygenation: Pre-oxygenation with 100% oxygen Intubation Type: IV induction Ventilation: Mask ventilation without difficulty Laryngoscope Size: Miller and 2 Grade View: Grade II Tube type: Oral Tube size: 7.5 mm Number of attempts: 1 Airway Equipment and Method: Stylet Placement Confirmation: ETT inserted through vocal cords under direct vision,  breath sounds checked- equal and bilateral and positive ETCO2 Secured at: 21 cm Tube secured with: Tape Dental Injury: Teeth and Oropharynx as per pre-operative assessment

## 2013-08-07 NOTE — Anesthesia Postprocedure Evaluation (Signed)
  Anesthesia Post-op Note  Patient: Kathryn [name]  Procedure(s) Performed: Procedure(s): PLACEMENT OF LUMBAR DRAIN (N/A)  Patient Location: PACU  Anesthesia Type:General  Level of Consciousness: awake, alert  and oriented  Airway and Oxygen Therapy: Patient Spontanous Breathing  Post-op Pain: mild  Post-op Assessment: Post-op Vital signs reviewed, Patient's Cardiovascular Status Stable, Respiratory Function Stable, Patent Airway and Pain level controlled  Post-op Vital Signs: stable  Complications: No apparent anesthesia complications

## 2013-08-07 NOTE — Transfer of Care (Signed)
Immediate Anesthesia Transfer of Care Note  Patient: Kathryn [name]  Procedure(s) Performed: Procedure(s): PLACEMENT OF LUMBAR DRAIN (N/A)  Patient Location: PACU  Anesthesia Type:General  Level of Consciousness: awake  Airway & Oxygen Therapy: Patient Spontanous Breathing and Patient connected to nasal cannula oxygen  Post-op Assessment: Report given to PACU RN and Post -op Vital signs reviewed and stable  Post vital signs: Reviewed and stable  Complications: No apparent anesthesia complications

## 2013-08-07 NOTE — Op Note (Signed)
Brief history: The patient is a 62 year old white female who's had multiple prior back surgeries. I performed a left L5-S1 laminotomy and foraminotomy on the patient yesterday. At surgery there was a CSF leak. 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 I recommended placement of a lumbar drain and oversewing of her wound. The patient has weighed the risks, benefits, and alternatives and decided proceed with the operation.  Preop diagnosis: CSF fistula  Postop diagnosis: The same  Procedure: Placement of a a lumbar drain and oversewing of the patient's wound.   Surgeon: Dr. Delma Officer  Asst.: None  Anesthesia: Gen. endotracheal  Specimens: None  Complications: None  Estimated blood loss: Minimal  Description of procedure: The patient was brought to the operating room by the anesthesia team. General endotracheal anesthesia was induced. The patient was turned to the prone position on the Wilson frame. Her lumbosacral region was then prepared with Betadine scrub and Betadine solution. Under fluoroscopic guidance I performed a approximately L4-5 spinal tap with the Toomey needle on the first attempt.. I threaded a lumbar drain through the Touhy needle and then removed the Touhy needle then the guidewire. There was good flow of CSF. The drainage system was connected.  I oversewed the patient's skin with a running 2-0 nylon suture. Sterile dressing was applied. The drapes were removed. The patient was then returned to the supine position where she was extubated by the anesthesia team and transported to the post anesthesia care he in stable condition.

## 2013-08-07 NOTE — Progress Notes (Signed)
Nutrition Brief Note  Patient identified on the Malnutrition Screening Tool (MST) Report for recent weight lost without trying.  Per readings below, patient's weight has been trending up.  Wt Readings from Last 15 Encounters:  08/06/13 239 lb (108.41 kg)  08/06/13 239 lb (108.41 kg)  08/06/13 239 lb (108.41 kg)  07/31/13 229 lb 9 oz (104.129 kg)    Body mass index is 42.35 kg/(m^2). Patient meets criteria for Obesity Class III based on current BMI.   Current diet order is Regular, patient is consuming approximately 100% of meals at this time. Labs and medications reviewed.   No nutrition interventions warranted at this time. If nutrition issues arise, please consult RD.   Maureen Chatters, RD, LDN Pager #: [phone] After-Hours Pager #: [phone]

## 2013-08-07 NOTE — Preoperative (Signed)
Beta Blockers   Reason not to administer Beta Blockers:not on beta blocker 

## 2013-08-07 NOTE — Progress Notes (Signed)
   CARE MANAGEMENT NOTE 08/07/2013  Patient:  Kathryn [name], Kathryn [name]   Account Number:  [id number]  Date Initiated:  08/07/2013  Documentation initiated by:  Jiles Crocker  Subjective/Objective Assessment:   ADMITTED FOR BACK SURGERY     Action/Plan:   PCP IS DR Mendel Corning [facility]; CM FOLLOWING FOR DCP   Anticipated DC Date:  08/13/2013   Anticipated DC Plan:  POSSIBLY HOME W HOME HEALTH SERVICES; AWAITING FOR PT/OT EVALS  DC Planning Services  CM consult          Status of service:  In process, will continue to follow Medicare Important Message given?  NA - LOS <3 / Initial given by admissions (If response is "NO", the following Medicare IM given date fields will be blank)  Per UR Regulation:  Reviewed for med. necessity/level of care/duration of stay  Comments:  10/24/2014Abelino Derrick RN,BSN,MHA [phone]

## 2013-08-07 NOTE — Progress Notes (Signed)
Wasted 13mg  of Dilaudid from am PCA with Guy Franco, RN

## 2013-08-07 NOTE — Anesthesia Preprocedure Evaluation (Addendum)
Anesthesia Evaluation  Patient identified by MRN, date of birth, ID band Patient awake    Reviewed: Allergy & Precautions, H&P , NPO status , Patient's Chart, lab work & pertinent test results  Airway Mallampati: II TM Distance: >3 FB Neck ROM: Full    Dental  (+) Teeth Intact and Dental Advisory Given   Pulmonary  breath sounds clear to auscultation        Cardiovascular Rhythm:Regular Rate:Normal     Neuro/Psych    GI/Hepatic   Endo/Other  Morbid obesity  Renal/GU      Musculoskeletal   Abdominal (+) + obese,   Peds  Hematology   Anesthesia Other Findings   Reproductive/Obstetrics                          Anesthesia Physical Anesthesia Plan  ASA: III  Anesthesia Plan: General and MAC   Post-op Pain Management:    Induction: Intravenous  Airway Management Planned: Natural Airway and Simple Face Mask  Additional Equipment:   Intra-op Plan:   Post-operative Plan:   Informed Consent: I have reviewed the patients History and Physical, chart, labs and discussed the procedure including the risks, benefits and alternatives for the proposed anesthesia with the patient or authorized representative who has indicated his/her understanding and acceptance.   Dental advisory given  Plan Discussed with: CRNA and Anesthesiologist  Anesthesia Plan Comments:         Anesthesia Quick Evaluation

## 2013-08-07 NOTE — Progress Notes (Signed)
Drain listed as ICP, ventric is really a lumbar drain

## 2013-08-08 MED ORDER — KETOROLAC TROMETHAMINE 30 MG/ML IJ SOLN
30.0000 mg | Freq: Four times a day (QID) | INTRAMUSCULAR | Status: DC | PRN
  Administered 2013-08-08 – 2013-08-11 (×7): 30 mg via INTRAVENOUS
  Filled 2013-08-08 (×7): qty 1

## 2013-08-08 MED ORDER — GABAPENTIN 400 MG PO CAPS
800.0000 mg | ORAL_CAPSULE | Freq: Four times a day (QID) | ORAL | Status: DC
  Administered 2013-08-08 – 2013-08-12 (×18): 800 mg via ORAL
  Filled 2013-08-08 (×22): qty 2

## 2013-08-08 MED ORDER — DIAZEPAM 5 MG PO TABS
10.0000 mg | ORAL_TABLET | Freq: Four times a day (QID) | ORAL | Status: DC | PRN
  Administered 2013-08-08 – 2013-08-09 (×5): 10 mg via ORAL
  Filled 2013-08-08 (×5): qty 2

## 2013-08-08 NOTE — Progress Notes (Signed)
Received call from Dr. Lovell Sheehan for patient to be NPO after midnight. Order placed.

## 2013-08-08 NOTE — Progress Notes (Signed)
Patient ID: Kathryn [name], female   DOB: March 18, 1951, 62 y.o.   MRN: [id number] Subjective:  the patient is alert and pleasant. She has episodes of what sounds like muscle spasms and radicular left leg pain. She has a mild headache  Objective: Vital signs in last 24 hours: Temp:  [97.4 F (36.3 C)-98.1 F (36.7 C)] 97.8 F (36.6 C) (10/25 0856) Pulse Rate:  [52-85] 70 (10/25 0856) Resp:  [10-24] 20 (10/25 0856) BP: (127-194)/(50-111) 170/80 mmHg (10/25 0856) SpO2:  [95 %-100 %] 100 % (10/25 0856) FiO2 (%):  [65 %] 65 % (10/25 0755)  Intake/Output from previous day: 10/24 0701 - 10/25 0700 In: 1550 [P.O.:400; I.V.:1150] Out: 653 [Urine:425; Drains:225; Blood:3] Intake/Output this shift:    Physical exam the patient is alert and oriented. She is moving her lower extremities well.  The patient's dressing is clean and dry. There is no evidence of drainage.  A lumbar drain is functioning well.  Lab Results: No results found for this basename: WBC, HGB, HCT, PLT,  in the last 72 hours BMET No results found for this basename: NA, K, CL, CO2, GLUCOSE, BUN, CREATININE, CALCIUM,  in the last 72 hours  Studies/Results: Dg Lumbar Spine 1 View  08/07/2013   CLINICAL DATA:  Lumbar drain placement.  EXAM: LUMBAR SPINE - 1 VIEW; DG C-ARM 1-60 MIN  COMPARISON:  None.  FINDINGS: Single intraoperative PA spot image of the low thoracic and upper lumbar spine is submitted. No visible surgical hardware or instrument. Prior cholecystectomy.  IMPRESSION: Single intraoperative spot image as above.   Electronically Signed   By: Charlett Nose M.D.   On: 08/07/2013 15:05   Dg Lumbar Spine 1 View  08/06/2013   CLINICAL DATA:  Lateral lumbar view for surgical localization  EXAM: LUMBAR SPINE - 1 VIEW  COMPARISON:  Lumbar MRI, 06/22/2013  FINDINGS: A surgical probe has been inserted through skin retractors. Lies posterior to the L5-S1 disc interspace.  Intervertebral cages partly maintained disc height at  L4-L5 stable from the prior MRI.  IMPRESSION: Localization image shows the surgical probes to lie posterior to the L5-S1 disc.   Electronically Signed   By: Amie Portland M.D.   On: 08/06/2013 14:08   Dg C-arm 1-60 Min  08/07/2013   CLINICAL DATA:  Lumbar drain placement.  EXAM: LUMBAR SPINE - 1 VIEW; DG C-ARM 1-60 MIN  COMPARISON:  None.  FINDINGS: Single intraoperative PA spot image of the low thoracic and upper lumbar spine is submitted. No visible surgical hardware or instrument. Prior cholecystectomy.  IMPRESSION: Single intraoperative spot image as above.   Electronically Signed   By: Charlett Nose M.D.   On: 08/07/2013 15:05    Assessment/Plan: Postop day #2/1: a lumbar drain is functioning well and has been no more drainage from her wound. We may have to adjust its height basedon the patient's headaches.  Pain: I think she is having some neuropathic pain. I will increase her Neurontin and add Toradol. I'll also increase her Valium for muscle spasm.  LOS: 2 days     Sony Schlarb D 08/08/2013, 9:20 AM

## 2013-08-08 NOTE — Progress Notes (Addendum)
Patient slept for about 4 hours after RN trying her pain under controlled. She said she felt so better when she woke up Ate dinner and her bedclothes changed. Lumbar drainage bag changed at 1700 by charged nurse Tami (RN). Total of 480 for the last 72 hrs.  205 cc was recorded at this shift.Will continue to monitor.

## 2013-08-09 LAB — GLUCOSE, CAPILLARY: Glucose-Capillary: 85 mg/dL (ref 70–99)

## 2013-08-09 NOTE — Progress Notes (Signed)
Pt reports some bilateral leg and back pain along with HA and N/V. LD still in place with ~50cc drainage today.   Wound c/d, no evidence of CSF leak  Will cont LD, possible clamp tomorrow.

## 2013-08-09 NOTE — Progress Notes (Signed)
Pt c/o headache and x2 emesis; first emesis contained only food but the second emesis was greenish-bile looking with no food in the vomit. Nausea medication administered with pain med. Pt sleeping comfortably in bed with call light within reach and all line and tubes patent. Will continue to monitor quietly.

## 2013-08-09 NOTE — Progress Notes (Signed)
2245 pt c/o uncontrolling pain, moaning and crying in bed. Prn pain meds given and PCA encouraged. Lumbar drainage bag changed. 175 ml including 50 ml from previous shift. Will continue to monitor pt and drainage bag.

## 2013-08-10 ENCOUNTER — Encounter (HOSPITAL_COMMUNITY): Payer: Self-pay | Admitting: Neurosurgery

## 2013-08-10 LAB — GLUCOSE, CAPILLARY: Glucose-Capillary: 110 mg/dL — ABNORMAL HIGH (ref 70–99)

## 2013-08-10 MED ORDER — ONDANSETRON HCL 4 MG/2ML IJ SOLN: 4.0000 mg | Freq: Four times a day (QID) | INTRAMUSCULAR | Status: DC | PRN

## 2013-08-10 MED ORDER — DIPHENHYDRAMINE HCL 50 MG/ML IJ SOLN: 12.5000 mg | Freq: Four times a day (QID) | INTRAMUSCULAR | Status: DC | PRN

## 2013-08-10 MED ORDER — HYDROMORPHONE 0.3 MG/ML IV SOLN
INTRAVENOUS | Status: DC
  Administered 2013-08-10: 21:00:00 via INTRAVENOUS
  Filled 2013-08-10: qty 25

## 2013-08-10 MED ORDER — SODIUM CHLORIDE 0.9 % IJ SOLN: 9.0000 mL | INTRAMUSCULAR | Status: DC | PRN

## 2013-08-10 MED ORDER — NALOXONE HCL 0.4 MG/ML IJ SOLN: 0.4000 mg | INTRAMUSCULAR | Status: DC | PRN

## 2013-08-10 MED ORDER — DIPHENHYDRAMINE HCL 12.5 MG/5ML PO ELIX: 12.5000 mg | ORAL_SOLUTION | Freq: Four times a day (QID) | ORAL | Status: DC | PRN

## 2013-08-10 NOTE — Progress Notes (Signed)
After speaking with Pt.'s son, they requested if MD can be called and switched Morphine PCA to Dilaudid PCA.  Patient continued to complained about headache.  Son indicated that Morphine causes her to have a headache.  MD paged and order was received to changed Morphine to Dilaudid PCA.  Will continue to monitor patient call bed placed within reach.

## 2013-08-10 NOTE — Progress Notes (Signed)
Patient ID: Kathryn [name], female   DOB: [birth year], 62 y.o.   MRN: [id number] Subjective:   The patient is alert and pleasant. She continues to complain of back and leg pain. She denies headaches.  Objective: Vital signs in last 24 hours: Temp:  [97.4 F (36.3 C)-98 F (36.7 C)] 97.4 F (36.3 C) (10/27 0600) Pulse Rate:  [65-107] 76 (10/27 0600) Resp:  [14-20] 14 (10/27 0730) BP: (143-187)/(78-95) 143/80 mmHg (10/27 0600) SpO2:  [100 %] 100 % (10/27 0730)  Intake/Output from previous day: 10/26 0701 - 10/27 0700 In: 3492.5 [I.V.:3492.5] Out: 2225 [Urine:2050; Drains:175] Intake/Output this shift:    Physical exam patient is alert and oriented. Her strength is normal. Her dressing is clean and dry.  Lab Results: No results found for this basename: WBC, HGB, HCT, PLT,  in the last 72 hours BMET No results found for this basename: NA, K, CL, CO2, GLUCOSE, BUN, CREATININE, CALCIUM,  in the last 72 hours  Studies/Results: No results found.  Assessment/Plan: Status post lumbar drain day #3: We will clamp the drain.  LOS: 4 days     Elissa Grieshop D 08/10/2013, 8:00 AM

## 2013-08-11 MED ORDER — HYDROMORPHONE HCL PF 1 MG/ML IJ SOLN: 1.0000 mg | INTRAMUSCULAR | Status: DC | PRN

## 2013-08-11 MED ORDER — DIPHENHYDRAMINE HCL 50 MG PO CAPS
50.0000 mg | ORAL_CAPSULE | Freq: Two times a day (BID) | ORAL | Status: DC
  Administered 2013-08-11 – 2013-08-12 (×3): 50 mg via ORAL
  Filled 2013-08-11 (×4): qty 1

## 2013-08-11 NOTE — Evaluation (Signed)
Physical Therapy Evaluation Patient Details Name: Kathryn [name] MRN: [id number] DOB: 1951/06/18 Today's Date: 08/11/2013 Time: 0865-7846 PT Time Calculation (min): 43 min  PT Assessment / Plan / Recommendation History of Present Illness  pt adm 10/23 for L5-S1 laminotomy, foraminotomy, and discectomy. Pt with durotomy and CSF leak requiring lumbar drain placed 10/24 and remained on bedrest. PMHx includes 5 prior back surgeries  Clinical Impression  Patient evaluated by Physical Therapy with no further acute PT needs identified. All education has been completed and the patient has no further questions.  See below for any follow-up Physial Therapy or equipment needs. PT is signing off. Thank you for this referral.     PT Assessment  Patent does not need any further PT services    Follow Up Recommendations  No PT follow up;Supervision - Intermittent    Does the patient have the potential to tolerate intense rehabilitation      Barriers to Discharge        Equipment Recommendations  None recommended by PT    Recommendations for Other Services     Frequency      Precautions / Restrictions Precautions Precautions: Back Precaution Booklet Issued: Yes (comment)   Pertinent Vitals/Pain Denied significant pain; denied dizziness      Mobility  Bed Mobility Bed Mobility: Not assessed Details for Bed Mobility Assistance: reviewed technique verbally and via written handout Transfers Transfers: Sit to Stand;Stand to Sit Sit to Stand: 5: Supervision Stand to Sit: 5: Supervision Details for Transfer Assistance: supervision for safety due to prolonged bedrest Ambulation/Gait Ambulation/Gait Assistance: 4: Min guard;5: Supervision Ambulation Distance (Feet): 250 Feet Assistive device: Rolling walker Ambulation/Gait Assistance Details: slight flexion at hips and tendency to look down at her feet; decr velocity Gait Pattern: Step-through pattern;Decreased stride length     Exercises     PT Diagnosis:    PT Problem List:   PT Treatment Interventions:       PT Goals(Current goals can be found in the care plan section) Acute Rehab PT Goals PT Goal Formulation: No goals set, d/c therapy  Visit Information  Last PT Received On: 08/11/13 Assistance Needed: +1 History of Present Illness: pt adm 10/23 for L5-S1 laminotomy, foraminotomy, and discectomy. Pt with durotomy and CSF leak requiring lumbar drain placed 10/24 and remained on bedrest. PMHx includes 5 prior back surgeries       Prior Functioning  Home Living Family/patient expects to be discharged to:: Private residence Living Arrangements: Alone Available Help at Discharge: Family;Available 24 hours/day (nearly 24 hr/day x 2 weeks) Type of Home: House Home Access: Ramped entrance Home Layout: One level Home Equipment: Walker - 2 wheels;Crutches;Bedside commode;Shower seat - built in;Grab bars - tub/shower;Adaptive equipment;Wheelchair - Equities trader: Reacher;Sock aid Additional Comments: Reports her children have arranged a schedule to stay with her nearly 24/7 initially Prior Function Level of Independence: Independent Communication Communication: No difficulties    Cognition  Cognition Arousal/Alertness: Awake/alert Behavior During Therapy: WFL for tasks assessed/performed Overall Cognitive Status: Within Functional Limits for tasks assessed    Extremity/Trunk Assessment Upper Extremity Assessment Upper Extremity Assessment: Overall WFL for tasks assessed Lower Extremity Assessment Lower Extremity Assessment: Overall WFL for tasks assessed;LLE deficits/detail LLE Sensation: decreased light touch (since her 3rd back surgery; numbness outer leg down to foot) Cervical / Trunk Assessment Cervical / Trunk Assessment: Normal   Balance    End of Session PT - End of Session Equipment Utilized During Treatment: Gait belt Activity Tolerance: Patient tolerated treatment  well Patient left: in chair;with call bell/phone within reach Nurse Communication: Mobility status  GP     Keiara Sneeringer 08/11/2013, 3:04 PM Pager [phone]

## 2013-08-11 NOTE — Progress Notes (Signed)
Patient ID: Kathryn [name], female   DOB: 11-13-1950, 62 y.o.   MRN: [id number] Subjective:  The patient is alert and pleasant. She looks better. She wants to mobilize. She denies headaches.  Objective: Vital signs in last 24 hours: Temp:  [97.7 F (36.5 C)-98 F (36.7 C)] 98 F (36.7 C) (10/28 0600) Pulse Rate:  [66-72] 71 (10/28 0600) Resp:  [10-24] 18 (10/28 0600) BP: (152-186)/(66-96) 167/91 mmHg (10/28 0600) SpO2:  [3 %-100 %] 97 % (10/28 0600)  Intake/Output from previous day: 10/27 0701 - 10/28 0700 In: 120 [P.O.:120] Out: 4480 [Urine:4300; Drains:180] Intake/Output this shift:    Physical exam patient is alert and oriented. Her strength is normal in her lower extremities. Her dressing is clean and dry. Her lumbar drain is closed.  Lab Results: No results found for this basename: WBC, HGB, HCT, PLT,  in the last 72 hours BMET No results found for this basename: NA, K, CL, CO2, GLUCOSE, BUN, CREATININE, CALCIUM,  in the last 72 hours  Studies/Results: No results found.  Assessment/Plan: Postop day number 5: The patient is doing well. We will mobilize her with PT. If she continues to have no drainage I will discontinue the lumbar drain tomorrow.  LOS: 5 days     Lesean Woolverton D 08/11/2013, 8:07 AM

## 2013-08-12 MED ORDER — OXYCODONE-ACETAMINOPHEN 10-325 MG PO TABS: 1.0000 | ORAL_TABLET | ORAL | Status: DC | PRN

## 2013-08-12 MED ORDER — DIAZEPAM 10 MG PO TABS: 10.0000 mg | ORAL_TABLET | Freq: Four times a day (QID) | ORAL | Status: DC | PRN

## 2013-08-12 MED ORDER — DSS 100 MG PO CAPS: 100.0000 mg | ORAL_CAPSULE | Freq: Two times a day (BID) | ORAL | Status: DC

## 2013-08-12 NOTE — Progress Notes (Signed)
Patient ID: Kathryn [name], female   DOB: 1951/03/24, 62 y.o.   MRN: [id number] Subjective:  The patient is alert and pleasant. She looks well. She has been mobilizing well. She has no headaches.  Objective: Vital signs in last 24 hours: Temp:  [97.8 F (36.6 C)-98.4 F (36.9 C)] 98.4 F (36.9 C) (10/29 0549) Pulse Rate:  [64-85] 64 (10/29 0549) Resp:  [18-24] 18 (10/29 0549) BP: (125-163)/(73-97) 131/81 mmHg (10/29 0549) SpO2:  [94 %-100 %] 94 % (10/29 0549)  Intake/Output from previous day: 10/28 0701 - 10/29 0700 In: 543.6 [P.O.:540; I.V.:3.6] Out: 450 [Urine:450] Intake/Output this shift:    Physical exam the patient is alert and oriented. Her strength is grossly normal in her lower extremities. Her dressing is clean and dry.  I discontinued patient's lumbar drain.  Lab Results: No results found for this basename: WBC, HGB, HCT, PLT,  in the last 72 hours BMET No results found for this basename: NA, K, CL, CO2, GLUCOSE, BUN, CREATININE, CALCIUM,  in the last 72 hours  Studies/Results: No results found.  Assessment/Plan: Postop day #6: The patient has done well with a lumbar drain. She will likely have some drainage and a bit of post LP headache with removal of the lumbar drain. We'll see how she does well today and possibly discharge later on today if not tomorrow.  LOS: 6 days     Kathryn [name] D 08/12/2013, 8:35 AM

## 2013-08-12 NOTE — Discharge Summary (Signed)
Physician Discharge Summary  Patient ID: Kathryn [name] MRN: [id number] DOB/AGE: February 07, 1951 62 y.o.  Admit date: 08/06/2013 Discharge date: 08/12/2013  Admission Diagnoses:left L5-S1 herniated disc, spondylosis, lumbar radiculopathy, lumbago, lumbar degenerative disc disease  Discharge Diagnoses: the same Active Problems:   * No active hospital problems. *   Discharged Condition: good  Hospital Course: I performed a left L5-S1 lamniotomy and foraminotomy on the patient on 08/06/2013.at surgery there was a durotomy on the ventral surface of the dura which could not be sewed. This was initially treated with a DuraSeal and bedrest.  Patient had a persistent CSF fistula. I therefore took the patient back to the OR on 08/07/2013 and placed a lumbar drain and oversewed her wound. We kept a lumbar drain in for several days and then clamped it. There was no CSF fistula or headaches. I removed a lumbar drain today.  The patient has requested discharge to home. She is given oral and written discharge instructions. All her questions have been answered.  Consults:PT OT Significant Diagnostic Studies:none Treatments:left L5-S1 laminotomy foraminotomy;placement of lumbar drain Discharge Exam: Blood pressure 154/77, pulse 74, temperature 97.8 F (36.6 C), temperature source Oral, resp. rate 18, height 5\' 3"  (1.6 m), weight 108.41 kg (239 lb), SpO2 96.00%. The patient is alert and pleasant. She looks well. Her lower extremity strength is normal. Her wound is healing well without discharge. There is no drainage from the lumbar drain exit site.  Disposition: home  Discharge Orders   Future Orders Complete By Expires   Call MD for:  difficulty breathing, headache or visual disturbances  As directed    Call MD for:  extreme fatigue  As directed    Call MD for:  hives  As directed    Call MD for:  persistant dizziness or light-headedness  As directed    Call MD for:  persistant nausea and  vomiting  As directed    Call MD for:  redness, tenderness, or signs of infection (pain, swelling, redness, odor or green/yellow discharge around incision site)  As directed    Call MD for:  severe uncontrolled pain  As directed    Call MD for:  temperature >100.4  As directed    Diet - low sodium heart healthy  As directed    Discharge instructions  As directed    Comments:     Call [phone] for a followup appointment. Take a stool softener while you are using pain medications.   Driving Restrictions  As directed    Comments:     Do not drive for 2 weeks.   Increase activity slowly  As directed    Lifting restrictions  As directed    Comments:     Do not lift more than 5 pounds. No excessive bending or twisting.   May shower / Bathe  As directed    Comments:     He may shower after the pain she is removed 3 days after surgery. Leave the incision alone.   Remove dressing in 24 hours  As directed        Medication List         diazepam 10 MG tablet  Commonly known as:  VALIUM  Take 1 tablet (10 mg total) by mouth every 6 (six) hours as needed.     diclofenac sodium 1 % Gel  Commonly known as:  VOLTAREN  Apply 4 g topically daily as needed (knee pain).     diphenhydrAMINE 25 MG tablet  Commonly known as:  SOMINEX  Take 100 mg by mouth 2 (two) times daily.     DSS 100 MG Caps  Take 100 mg by mouth 2 (two) times daily.     gabapentin 300 MG capsule  Commonly known as:  NEURONTIN  Take 600 mg by mouth every 8 (eight) hours.     loratadine 10 MG tablet  Commonly known as:  CLARITIN  Take 10 mg by mouth daily as needed for allergies.     multivitamin with minerals tablet  Take 1 tablet by mouth daily.     NUCYNTA ER 200 MG Tb12  Generic drug:  Tapentadol HCl  Take 200 mg by mouth 2 (two) times daily.     OVER THE COUNTER MEDICATION  Take 2 capsules by mouth daily. Cartilast with ASU     oxyCODONE-acetaminophen 10-325 MG per tablet  Commonly known as:  PERCOCET   Take 1 tablet by mouth every 4 (four) hours as needed for pain.     oxyCODONE-acetaminophen 10-325 MG per tablet  Commonly known as:  PERCOCET  Take 1 tablet by mouth every 6 (six) hours.         SignedCristi Loron 08/12/2013, 6:15 PM

## 2013-08-12 NOTE — Progress Notes (Signed)
Pt. DC home by car with family.  DC instructions and prescriptions given to patient.  Pt. Vital signs and assessments stable.

## 2014-10-22 DIAGNOSIS — G894 Chronic pain syndrome: Secondary | ICD-10-CM | POA: Diagnosis not present

## 2014-10-22 DIAGNOSIS — Z79891 Long term (current) use of opiate analgesic: Secondary | ICD-10-CM | POA: Diagnosis not present

## 2014-10-22 DIAGNOSIS — M4726 Other spondylosis with radiculopathy, lumbar region: Secondary | ICD-10-CM | POA: Diagnosis not present

## 2014-10-22 DIAGNOSIS — M961 Postlaminectomy syndrome, not elsewhere classified: Secondary | ICD-10-CM | POA: Diagnosis not present

## 2014-12-03 DIAGNOSIS — Z6841 Body Mass Index (BMI) 40.0 and over, adult: Secondary | ICD-10-CM | POA: Diagnosis not present

## 2014-12-03 DIAGNOSIS — M545 Low back pain: Secondary | ICD-10-CM | POA: Diagnosis not present

## 2014-12-03 DIAGNOSIS — R030 Elevated blood-pressure reading, without diagnosis of hypertension: Secondary | ICD-10-CM | POA: Diagnosis not present

## 2015-01-19 DIAGNOSIS — M961 Postlaminectomy syndrome, not elsewhere classified: Secondary | ICD-10-CM | POA: Diagnosis not present

## 2015-01-19 DIAGNOSIS — M4726 Other spondylosis with radiculopathy, lumbar region: Secondary | ICD-10-CM | POA: Diagnosis not present

## 2015-01-19 DIAGNOSIS — Z79891 Long term (current) use of opiate analgesic: Secondary | ICD-10-CM | POA: Diagnosis not present

## 2015-01-19 DIAGNOSIS — G894 Chronic pain syndrome: Secondary | ICD-10-CM | POA: Diagnosis not present

## 2015-05-17 DIAGNOSIS — M1712 Unilateral primary osteoarthritis, left knee: Secondary | ICD-10-CM | POA: Diagnosis not present

## 2015-05-17 DIAGNOSIS — M1711 Unilateral primary osteoarthritis, right knee: Secondary | ICD-10-CM | POA: Diagnosis not present

## 2015-05-23 DIAGNOSIS — M1711 Unilateral primary osteoarthritis, right knee: Secondary | ICD-10-CM | POA: Diagnosis not present

## 2015-05-23 DIAGNOSIS — M1712 Unilateral primary osteoarthritis, left knee: Secondary | ICD-10-CM | POA: Diagnosis not present

## 2015-05-23 DIAGNOSIS — M170 Bilateral primary osteoarthritis of knee: Secondary | ICD-10-CM | POA: Diagnosis not present

## 2015-05-31 DIAGNOSIS — Z79891 Long term (current) use of opiate analgesic: Secondary | ICD-10-CM | POA: Diagnosis not present

## 2015-05-31 DIAGNOSIS — M1712 Unilateral primary osteoarthritis, left knee: Secondary | ICD-10-CM | POA: Diagnosis not present

## 2015-05-31 DIAGNOSIS — M150 Primary generalized (osteo)arthritis: Secondary | ICD-10-CM | POA: Diagnosis not present

## 2015-05-31 DIAGNOSIS — M170 Bilateral primary osteoarthritis of knee: Secondary | ICD-10-CM | POA: Diagnosis not present

## 2015-05-31 DIAGNOSIS — M961 Postlaminectomy syndrome, not elsewhere classified: Secondary | ICD-10-CM | POA: Diagnosis not present

## 2015-05-31 DIAGNOSIS — M1711 Unilateral primary osteoarthritis, right knee: Secondary | ICD-10-CM | POA: Diagnosis not present

## 2015-05-31 DIAGNOSIS — G894 Chronic pain syndrome: Secondary | ICD-10-CM | POA: Diagnosis not present

## 2015-08-31 DIAGNOSIS — Z79891 Long term (current) use of opiate analgesic: Secondary | ICD-10-CM | POA: Diagnosis not present

## 2015-08-31 DIAGNOSIS — M961 Postlaminectomy syndrome, not elsewhere classified: Secondary | ICD-10-CM | POA: Diagnosis not present

## 2015-08-31 DIAGNOSIS — G894 Chronic pain syndrome: Secondary | ICD-10-CM | POA: Diagnosis not present

## 2015-08-31 DIAGNOSIS — M150 Primary generalized (osteo)arthritis: Secondary | ICD-10-CM | POA: Diagnosis not present

## 2015-11-08 DIAGNOSIS — M150 Primary generalized (osteo)arthritis: Secondary | ICD-10-CM | POA: Diagnosis not present

## 2015-11-08 DIAGNOSIS — G894 Chronic pain syndrome: Secondary | ICD-10-CM | POA: Diagnosis not present

## 2015-11-08 DIAGNOSIS — Z79891 Long term (current) use of opiate analgesic: Secondary | ICD-10-CM | POA: Diagnosis not present

## 2015-11-08 DIAGNOSIS — M961 Postlaminectomy syndrome, not elsewhere classified: Secondary | ICD-10-CM | POA: Diagnosis not present

## 2016-04-20 DIAGNOSIS — Z79891 Long term (current) use of opiate analgesic: Secondary | ICD-10-CM | POA: Diagnosis not present

## 2016-04-20 DIAGNOSIS — G894 Chronic pain syndrome: Secondary | ICD-10-CM | POA: Diagnosis not present

## 2016-04-20 DIAGNOSIS — M150 Primary generalized (osteo)arthritis: Secondary | ICD-10-CM | POA: Diagnosis not present

## 2016-04-20 DIAGNOSIS — M961 Postlaminectomy syndrome, not elsewhere classified: Secondary | ICD-10-CM | POA: Diagnosis not present

## 2016-06-22 DIAGNOSIS — M961 Postlaminectomy syndrome, not elsewhere classified: Secondary | ICD-10-CM | POA: Diagnosis not present

## 2016-06-22 DIAGNOSIS — G894 Chronic pain syndrome: Secondary | ICD-10-CM | POA: Diagnosis not present

## 2016-06-22 DIAGNOSIS — M150 Primary generalized (osteo)arthritis: Secondary | ICD-10-CM | POA: Diagnosis not present

## 2016-06-22 DIAGNOSIS — Z79891 Long term (current) use of opiate analgesic: Secondary | ICD-10-CM | POA: Diagnosis not present

## 2016-07-04 DIAGNOSIS — I10 Essential (primary) hypertension: Secondary | ICD-10-CM | POA: Diagnosis not present

## 2016-07-04 DIAGNOSIS — Z0000 Encounter for general adult medical examination without abnormal findings: Secondary | ICD-10-CM | POA: Diagnosis not present

## 2016-07-04 DIAGNOSIS — Z1211 Encounter for screening for malignant neoplasm of colon: Secondary | ICD-10-CM | POA: Diagnosis not present

## 2016-07-04 DIAGNOSIS — Z1389 Encounter for screening for other disorder: Secondary | ICD-10-CM | POA: Diagnosis not present

## 2016-07-04 DIAGNOSIS — Z23 Encounter for immunization: Secondary | ICD-10-CM | POA: Diagnosis not present

## 2016-07-23 DIAGNOSIS — Z1231 Encounter for screening mammogram for malignant neoplasm of breast: Secondary | ICD-10-CM | POA: Diagnosis not present

## 2016-08-13 DIAGNOSIS — Z79899 Other long term (current) drug therapy: Secondary | ICD-10-CM | POA: Diagnosis not present

## 2016-08-13 DIAGNOSIS — E785 Hyperlipidemia, unspecified: Secondary | ICD-10-CM | POA: Diagnosis not present

## 2016-08-14 DIAGNOSIS — Z01419 Encounter for gynecological examination (general) (routine) without abnormal findings: Secondary | ICD-10-CM | POA: Diagnosis not present

## 2016-08-15 DIAGNOSIS — Z01419 Encounter for gynecological examination (general) (routine) without abnormal findings: Secondary | ICD-10-CM | POA: Diagnosis not present

## 2016-08-15 DIAGNOSIS — K648 Other hemorrhoids: Secondary | ICD-10-CM | POA: Diagnosis not present

## 2016-08-17 DIAGNOSIS — M150 Primary generalized (osteo)arthritis: Secondary | ICD-10-CM | POA: Diagnosis not present

## 2016-08-17 DIAGNOSIS — Z79891 Long term (current) use of opiate analgesic: Secondary | ICD-10-CM | POA: Diagnosis not present

## 2016-08-17 DIAGNOSIS — M961 Postlaminectomy syndrome, not elsewhere classified: Secondary | ICD-10-CM | POA: Diagnosis not present

## 2016-08-17 DIAGNOSIS — G894 Chronic pain syndrome: Secondary | ICD-10-CM | POA: Diagnosis not present

## 2016-08-20 DIAGNOSIS — H04123 Dry eye syndrome of bilateral lacrimal glands: Secondary | ICD-10-CM | POA: Diagnosis not present

## 2016-09-14 DIAGNOSIS — Z1211 Encounter for screening for malignant neoplasm of colon: Secondary | ICD-10-CM | POA: Diagnosis not present

## 2016-09-14 DIAGNOSIS — M1990 Unspecified osteoarthritis, unspecified site: Secondary | ICD-10-CM | POA: Diagnosis not present

## 2016-09-14 DIAGNOSIS — K648 Other hemorrhoids: Secondary | ICD-10-CM | POA: Diagnosis not present

## 2016-09-14 DIAGNOSIS — E7800 Pure hypercholesterolemia, unspecified: Secondary | ICD-10-CM | POA: Diagnosis not present

## 2016-09-14 DIAGNOSIS — Z79899 Other long term (current) drug therapy: Secondary | ICD-10-CM | POA: Diagnosis not present

## 2016-09-14 DIAGNOSIS — I10 Essential (primary) hypertension: Secondary | ICD-10-CM | POA: Diagnosis not present

## 2016-09-14 DIAGNOSIS — Z9049 Acquired absence of other specified parts of digestive tract: Secondary | ICD-10-CM | POA: Diagnosis not present

## 2016-09-14 DIAGNOSIS — D122 Benign neoplasm of ascending colon: Secondary | ICD-10-CM | POA: Diagnosis not present

## 2016-09-14 DIAGNOSIS — Z86010 Personal history of colonic polyps: Secondary | ICD-10-CM | POA: Diagnosis not present

## 2016-09-14 DIAGNOSIS — K635 Polyp of colon: Secondary | ICD-10-CM | POA: Diagnosis not present

## 2016-09-14 DIAGNOSIS — K5730 Diverticulosis of large intestine without perforation or abscess without bleeding: Secondary | ICD-10-CM | POA: Diagnosis not present

## 2016-09-14 DIAGNOSIS — Z800 Family history of malignant neoplasm of digestive organs: Secondary | ICD-10-CM | POA: Diagnosis not present

## 2016-11-06 DIAGNOSIS — M150 Primary generalized (osteo)arthritis: Secondary | ICD-10-CM | POA: Diagnosis not present

## 2016-11-06 DIAGNOSIS — M961 Postlaminectomy syndrome, not elsewhere classified: Secondary | ICD-10-CM | POA: Diagnosis not present

## 2016-11-06 DIAGNOSIS — G894 Chronic pain syndrome: Secondary | ICD-10-CM | POA: Diagnosis not present

## 2016-11-06 DIAGNOSIS — Z79891 Long term (current) use of opiate analgesic: Secondary | ICD-10-CM | POA: Diagnosis not present

## 2017-01-08 DIAGNOSIS — M5416 Radiculopathy, lumbar region: Secondary | ICD-10-CM | POA: Diagnosis not present

## 2017-01-08 DIAGNOSIS — G894 Chronic pain syndrome: Secondary | ICD-10-CM | POA: Diagnosis not present

## 2017-01-08 DIAGNOSIS — Z79891 Long term (current) use of opiate analgesic: Secondary | ICD-10-CM | POA: Diagnosis not present

## 2017-01-08 DIAGNOSIS — M961 Postlaminectomy syndrome, not elsewhere classified: Secondary | ICD-10-CM | POA: Diagnosis not present

## 2017-01-21 ENCOUNTER — Other Ambulatory Visit: Payer: Self-pay

## 2017-02-05 DIAGNOSIS — Z79891 Long term (current) use of opiate analgesic: Secondary | ICD-10-CM | POA: Diagnosis not present

## 2017-02-05 DIAGNOSIS — M961 Postlaminectomy syndrome, not elsewhere classified: Secondary | ICD-10-CM | POA: Diagnosis not present

## 2017-02-05 DIAGNOSIS — M5416 Radiculopathy, lumbar region: Secondary | ICD-10-CM | POA: Diagnosis not present

## 2017-02-05 DIAGNOSIS — G894 Chronic pain syndrome: Secondary | ICD-10-CM | POA: Diagnosis not present

## 2017-02-13 ENCOUNTER — Other Ambulatory Visit: Payer: Self-pay | Admitting: Physical Medicine and Rehabilitation

## 2017-02-13 DIAGNOSIS — M545 Low back pain: Secondary | ICD-10-CM

## 2017-02-26 ENCOUNTER — Ambulatory Visit
Admission: RE | Admit: 2017-02-26 | Discharge: 2017-02-26 | Disposition: A | Discharge status: Home / Self Care | Payer: Medicare Other | Source: Ambulatory Visit | Attending: Physical Medicine and Rehabilitation | Admitting: Physical Medicine and Rehabilitation

## 2017-02-26 DIAGNOSIS — M545 Low back pain: Secondary | ICD-10-CM

## 2017-02-26 DIAGNOSIS — M48061 Spinal stenosis, lumbar region without neurogenic claudication: Secondary | ICD-10-CM | POA: Diagnosis not present

## 2017-02-26 MED ORDER — GADOBENATE DIMEGLUMINE 529 MG/ML IV SOLN
20.0000 mL | Freq: Once | INTRAVENOUS | Status: AC | PRN
  Administered 2017-02-26: 20 mL via INTRAVENOUS

## 2017-03-13 DIAGNOSIS — M961 Postlaminectomy syndrome, not elsewhere classified: Secondary | ICD-10-CM | POA: Diagnosis not present

## 2017-03-13 DIAGNOSIS — Z79891 Long term (current) use of opiate analgesic: Secondary | ICD-10-CM | POA: Diagnosis not present

## 2017-03-13 DIAGNOSIS — G894 Chronic pain syndrome: Secondary | ICD-10-CM | POA: Diagnosis not present

## 2017-03-13 DIAGNOSIS — M5416 Radiculopathy, lumbar region: Secondary | ICD-10-CM | POA: Diagnosis not present

## 2017-04-09 DIAGNOSIS — M5136 Other intervertebral disc degeneration, lumbar region: Secondary | ICD-10-CM | POA: Diagnosis not present

## 2017-04-09 DIAGNOSIS — I10 Essential (primary) hypertension: Secondary | ICD-10-CM | POA: Diagnosis not present

## 2017-04-09 DIAGNOSIS — M545 Low back pain: Secondary | ICD-10-CM | POA: Diagnosis not present

## 2017-04-10 DIAGNOSIS — Z79891 Long term (current) use of opiate analgesic: Secondary | ICD-10-CM | POA: Diagnosis not present

## 2017-04-10 DIAGNOSIS — G894 Chronic pain syndrome: Secondary | ICD-10-CM | POA: Diagnosis not present

## 2017-04-10 DIAGNOSIS — M5416 Radiculopathy, lumbar region: Secondary | ICD-10-CM | POA: Diagnosis not present

## 2017-04-10 DIAGNOSIS — M961 Postlaminectomy syndrome, not elsewhere classified: Secondary | ICD-10-CM | POA: Diagnosis not present

## 2017-05-08 DIAGNOSIS — Z79891 Long term (current) use of opiate analgesic: Secondary | ICD-10-CM | POA: Diagnosis not present

## 2017-05-08 DIAGNOSIS — M5416 Radiculopathy, lumbar region: Secondary | ICD-10-CM | POA: Diagnosis not present

## 2017-05-08 DIAGNOSIS — G894 Chronic pain syndrome: Secondary | ICD-10-CM | POA: Diagnosis not present

## 2017-05-08 DIAGNOSIS — M150 Primary generalized (osteo)arthritis: Secondary | ICD-10-CM | POA: Diagnosis not present

## 2017-05-08 DIAGNOSIS — M961 Postlaminectomy syndrome, not elsewhere classified: Secondary | ICD-10-CM | POA: Diagnosis not present

## 2017-06-05 DIAGNOSIS — M5416 Radiculopathy, lumbar region: Secondary | ICD-10-CM | POA: Diagnosis not present

## 2017-06-05 DIAGNOSIS — G894 Chronic pain syndrome: Secondary | ICD-10-CM | POA: Diagnosis not present

## 2017-06-05 DIAGNOSIS — Z79891 Long term (current) use of opiate analgesic: Secondary | ICD-10-CM | POA: Diagnosis not present

## 2017-06-05 DIAGNOSIS — R51 Headache: Secondary | ICD-10-CM | POA: Diagnosis not present

## 2017-08-05 DIAGNOSIS — G894 Chronic pain syndrome: Secondary | ICD-10-CM | POA: Diagnosis not present

## 2017-08-05 DIAGNOSIS — M961 Postlaminectomy syndrome, not elsewhere classified: Secondary | ICD-10-CM | POA: Diagnosis not present

## 2017-08-05 DIAGNOSIS — M5416 Radiculopathy, lumbar region: Secondary | ICD-10-CM | POA: Diagnosis not present

## 2017-08-05 DIAGNOSIS — Z79891 Long term (current) use of opiate analgesic: Secondary | ICD-10-CM | POA: Diagnosis not present

## 2017-08-06 DIAGNOSIS — Z23 Encounter for immunization: Secondary | ICD-10-CM | POA: Diagnosis not present

## 2017-10-16 DIAGNOSIS — Z79891 Long term (current) use of opiate analgesic: Secondary | ICD-10-CM | POA: Diagnosis not present

## 2017-10-16 DIAGNOSIS — M5416 Radiculopathy, lumbar region: Secondary | ICD-10-CM | POA: Diagnosis not present

## 2017-10-16 DIAGNOSIS — G894 Chronic pain syndrome: Secondary | ICD-10-CM | POA: Diagnosis not present

## 2017-10-16 DIAGNOSIS — M961 Postlaminectomy syndrome, not elsewhere classified: Secondary | ICD-10-CM | POA: Diagnosis not present

## 2017-11-18 DIAGNOSIS — G894 Chronic pain syndrome: Secondary | ICD-10-CM | POA: Diagnosis not present

## 2017-11-18 DIAGNOSIS — Z79891 Long term (current) use of opiate analgesic: Secondary | ICD-10-CM | POA: Diagnosis not present

## 2017-11-18 DIAGNOSIS — M5416 Radiculopathy, lumbar region: Secondary | ICD-10-CM | POA: Diagnosis not present

## 2017-11-18 DIAGNOSIS — M961 Postlaminectomy syndrome, not elsewhere classified: Secondary | ICD-10-CM | POA: Diagnosis not present

## 2018-01-10 DIAGNOSIS — G894 Chronic pain syndrome: Secondary | ICD-10-CM | POA: Diagnosis not present

## 2018-01-10 DIAGNOSIS — M5416 Radiculopathy, lumbar region: Secondary | ICD-10-CM | POA: Diagnosis not present

## 2018-01-10 DIAGNOSIS — M961 Postlaminectomy syndrome, not elsewhere classified: Secondary | ICD-10-CM | POA: Diagnosis not present

## 2018-01-10 DIAGNOSIS — Z79891 Long term (current) use of opiate analgesic: Secondary | ICD-10-CM | POA: Diagnosis not present

## 2018-03-07 DIAGNOSIS — G894 Chronic pain syndrome: Secondary | ICD-10-CM | POA: Diagnosis not present

## 2018-03-07 DIAGNOSIS — M961 Postlaminectomy syndrome, not elsewhere classified: Secondary | ICD-10-CM | POA: Diagnosis not present

## 2018-03-07 DIAGNOSIS — Z79891 Long term (current) use of opiate analgesic: Secondary | ICD-10-CM | POA: Diagnosis not present

## 2018-03-07 DIAGNOSIS — M5416 Radiculopathy, lumbar region: Secondary | ICD-10-CM | POA: Diagnosis not present

## 2018-05-16 DIAGNOSIS — M961 Postlaminectomy syndrome, not elsewhere classified: Secondary | ICD-10-CM | POA: Diagnosis not present

## 2018-05-16 DIAGNOSIS — M5416 Radiculopathy, lumbar region: Secondary | ICD-10-CM | POA: Diagnosis not present

## 2018-05-16 DIAGNOSIS — Z79891 Long term (current) use of opiate analgesic: Secondary | ICD-10-CM | POA: Diagnosis not present

## 2018-05-16 DIAGNOSIS — G894 Chronic pain syndrome: Secondary | ICD-10-CM | POA: Diagnosis not present

## 2018-08-05 DIAGNOSIS — Z79891 Long term (current) use of opiate analgesic: Secondary | ICD-10-CM | POA: Diagnosis not present

## 2018-08-05 DIAGNOSIS — M961 Postlaminectomy syndrome, not elsewhere classified: Secondary | ICD-10-CM | POA: Diagnosis not present

## 2018-08-05 DIAGNOSIS — M5416 Radiculopathy, lumbar region: Secondary | ICD-10-CM | POA: Diagnosis not present

## 2018-08-05 DIAGNOSIS — G894 Chronic pain syndrome: Secondary | ICD-10-CM | POA: Diagnosis not present

## 2018-09-02 DIAGNOSIS — Z23 Encounter for immunization: Secondary | ICD-10-CM | POA: Diagnosis not present

## 2018-09-02 DIAGNOSIS — R319 Hematuria, unspecified: Secondary | ICD-10-CM | POA: Diagnosis not present

## 2018-09-02 DIAGNOSIS — Z2821 Immunization not carried out because of patient refusal: Secondary | ICD-10-CM | POA: Diagnosis not present

## 2018-09-02 DIAGNOSIS — R82998 Other abnormal findings in urine: Secondary | ICD-10-CM | POA: Diagnosis not present

## 2018-09-09 DIAGNOSIS — R87619 Unspecified abnormal cytological findings in specimens from cervix uteri: Secondary | ICD-10-CM | POA: Diagnosis not present

## 2018-09-09 DIAGNOSIS — N950 Postmenopausal bleeding: Secondary | ICD-10-CM | POA: Diagnosis not present

## 2018-09-09 DIAGNOSIS — Z1231 Encounter for screening mammogram for malignant neoplasm of breast: Secondary | ICD-10-CM | POA: Diagnosis not present

## 2018-09-09 DIAGNOSIS — C530 Malignant neoplasm of endocervix: Secondary | ICD-10-CM | POA: Diagnosis not present

## 2018-09-16 ENCOUNTER — Telehealth: Payer: Self-pay | Admitting: *Deleted

## 2018-09-16 NOTE — Telephone Encounter (Signed)
Called and spoke with the patient, the patient stated "I was told that I needed to have a CT scan and another test for breast issue." Per Melissa APP the patient can come after the scan. Spoke back with the patient, she will call us back after the scan are scheduled.

## 2018-09-17 ENCOUNTER — Telehealth: Payer: Self-pay | Admitting: *Deleted

## 2018-09-17 ENCOUNTER — Other Ambulatory Visit: Payer: Self-pay | Admitting: Obstetrics and Gynecology

## 2018-09-17 DIAGNOSIS — C541 Malignant neoplasm of endometrium: Secondary | ICD-10-CM

## 2018-09-17 DIAGNOSIS — R928 Other abnormal and inconclusive findings on diagnostic imaging of breast: Secondary | ICD-10-CM

## 2018-09-17 DIAGNOSIS — N6310 Unspecified lump in the right breast, unspecified quadrant: Secondary | ICD-10-CM

## 2018-09-17 NOTE — Telephone Encounter (Signed)
Patient called back and scheduled her new appt for 12/11 at 11 am.

## 2018-09-20 ENCOUNTER — Ambulatory Visit
Admission: RE | Admit: 2018-09-20 | Discharge: 2018-09-20 | Disposition: A | Discharge status: Home / Self Care | Payer: Medicare Other | Source: Ambulatory Visit | Attending: Obstetrics and Gynecology | Admitting: Obstetrics and Gynecology

## 2018-09-20 DIAGNOSIS — C541 Malignant neoplasm of endometrium: Secondary | ICD-10-CM

## 2018-09-20 MED ORDER — IOPAMIDOL (ISOVUE-300) INJECTION 61%
100.0000 mL | Freq: Once | INTRAVENOUS | Status: AC | PRN
  Administered 2018-09-20: 100 mL via INTRAVENOUS

## 2018-09-22 ENCOUNTER — Ambulatory Visit
Admission: RE | Admit: 2018-09-22 | Discharge: 2018-09-22 | Disposition: A | Discharge status: Home / Self Care | Payer: Medicare Other | Source: Ambulatory Visit | Attending: Obstetrics and Gynecology | Admitting: Obstetrics and Gynecology

## 2018-09-22 DIAGNOSIS — R928 Other abnormal and inconclusive findings on diagnostic imaging of breast: Secondary | ICD-10-CM

## 2018-09-22 DIAGNOSIS — N6489 Other specified disorders of breast: Secondary | ICD-10-CM | POA: Diagnosis not present

## 2018-09-22 NOTE — Progress Notes (Signed)
Updated med list from dr office .

## 2018-09-24 ENCOUNTER — Inpatient Hospital Stay: Payer: Medicare Other | Attending: Obstetrics

## 2018-09-24 ENCOUNTER — Encounter: Payer: Self-pay | Admitting: Obstetrics

## 2018-09-24 ENCOUNTER — Inpatient Hospital Stay (HOSPITAL_BASED_OUTPATIENT_CLINIC_OR_DEPARTMENT_OTHER): Payer: Medicare Other | Admitting: Obstetrics

## 2018-09-24 VITALS — BP 134/92 | HR 75 | Temp 98.5°F | Resp 20 | Ht 63.0 in | Wt 305.0 lb

## 2018-09-24 DIAGNOSIS — R591 Generalized enlarged lymph nodes: Secondary | ICD-10-CM | POA: Insufficient documentation

## 2018-09-24 DIAGNOSIS — C541 Malignant neoplasm of endometrium: Secondary | ICD-10-CM | POA: Diagnosis not present

## 2018-09-24 DIAGNOSIS — R590 Localized enlarged lymph nodes: Secondary | ICD-10-CM

## 2018-09-24 DIAGNOSIS — R599 Enlarged lymph nodes, unspecified: Secondary | ICD-10-CM | POA: Insufficient documentation

## 2018-09-24 DIAGNOSIS — D4959 Neoplasm of unspecified behavior of other genitourinary organ: Secondary | ICD-10-CM

## 2018-09-24 LAB — COMPREHENSIVE METABOLIC PANEL
ALT: 17 U/L (ref 0–44)
AST: 20 U/L (ref 15–41)
Albumin: 4.2 g/dL (ref 3.5–5.0)
Alkaline Phosphatase: 121 U/L (ref 38–126)
Anion gap: 11 (ref 5–15)
BUN: 14 mg/dL (ref 8–23)
CO2: 27 mmol/L (ref 22–32)
Calcium: 9.6 mg/dL (ref 8.9–10.3)
Chloride: 103 mmol/L (ref 98–111)
Creatinine, Ser: 0.82 mg/dL (ref 0.44–1.00)
GFR calc Af Amer: >60 mL/min (ref >60)
GFR calc non Af Amer: >60 mL/min (ref >60)
Glucose, Bld: 103 mg/dL — ABNORMAL HIGH (ref 70–99)
POTASSIUM: 3.9 mmol/L (ref 3.5–5.1)
SODIUM: 141 mmol/L (ref 135–145)
Total Bilirubin: 0.5 mg/dL (ref 0.3–1.2)
Total Protein: 8.3 g/dL — ABNORMAL HIGH (ref 6.5–8.1)

## 2018-09-24 NOTE — Patient Instructions (Addendum)
1. We will get a CT of the chest and/or PET scan 2. Referral to Medical Oncology. We will have you scheduled to meet with Dr. Alvy Bimler after the PET scan has been performed and the biopsy has been done. 3. A biopsy of the lymph nodes will be requested. You will receive a phone call from the hospital to arrange for this biopsy. 4. Return to see me in 104months. (Sooner if Dr. Alvy Bimler wants a lymph node removed by [place] laparoscopically or if lymphoma is suspected and then hysterectomy indicated)

## 2018-09-24 NOTE — Progress Notes (Addendum)
Consult Note: New Patient First Visit   Consult was requested by Dr. Paula Compton for Endometrial Cancer   Chief Complaint  Patient presents with  . Endometrial cancer [facility])    GYN Oncologic Summary 1. TBD o .  HPI: Ms. Kathryn [name]  is a very nice 67 y.o.  P2  She noted postmenopausal bleeding for the first time in October 2019. This then was heavy in November and at that time she followed up with her PCP. A Pap was done 09/02/18 showign [facility]. She was referred onto GYN and seen by Dr. Paula Compton who performed a colposcopy, ECC, and EMB.  She had early menopause at age 88. She has not been sexually active since her 57's. She claims to have been regular with her Pap smears and states prior to the current workup her last Pap was 2 years ago. She states she has never before had an abnormal Pap.  The EMB and ECC returned with adenocarcinoma, favoring endometrial primary however + (strongly) for p16 and ER (patchy).  09/22/18 CT scan as noted in detail below in summary -- Diffuse endometrial thickening, consistent with primary endometrial carcinoma. Pelvic and abdominal retroperitoneal and retrocrural lymphadenopathy, consistent with metastatic disease.  She is referred for management and recommendations  Imported EPIC Oncologic History:   No history exists.    Measurement of disease: . TBD  Radiology: Ct Abdomen Pelvis W Contrast  Result Date: 09/22/2018 CLINICAL DATA:  Newly diagnosed endometrial carcinoma.  Staging. Creatinine was obtained on site at [place] at [address] Results: Creatinine 0.7 mg/dL. EXAM: CT ABDOMEN AND PELVIS WITH CONTRAST TECHNIQUE: Multidetector CT imaging of the abdomen and pelvis was performed using the standard protocol following bolus administration of intravenous contrast. CONTRAST:  179mL ISOVUE-300 IOPAMIDOL (ISOVUE-300) INJECTION 61% COMPARISON:  None. FINDINGS: Lower Chest: No acute findings. Hepatobiliary: No  hepatic masses identified. Two small cysts are seen in the left hepatic lobe. Moderate diffuse hepatic steatosis noted. Prior cholecystectomy. No evidence of biliary obstruction. Pancreas:  No mass or inflammatory changes. Spleen: Within normal limits in size and appearance. Adrenals/Urinary Tract: No masses identified. No evidence of hydronephrosis. Unremarkable unopacified urinary bladder. Stomach/Bowel: No evidence of obstruction, inflammatory process or abnormal fluid collections. Mild diverticulosis is seen involving the descending and sigmoid colon, without evidence of diverticulitis. Vascular/Lymphatic: Pelvic lymphadenopathy is seen in the left common and external iliac chains, with largest lymph node in the left common iliac region measuring 2.5 cm on image 57/2. Mild right external iliac lymphadenopathy is seen with largest lymph node measuring 1.8 cm on image 62/2. Retroperitoneal lymphadenopathy is seen throughout the aortocaval and left paraaortic spaces, with largest lymph node measuring 1.7 cm on image 39/2. A 10 mm right retrocaval lymph node is also seen. No abdominal aortic aneurysm. Reproductive: Diffuse endometrial thickening is seen measuring approximately 25 mm, consistent with known endometrial carcinoma. No evidence of extra uterine extension. Adnexal regions are unremarkable. Other:  None. Musculoskeletal:  No suspicious bone lesions identified. IMPRESSION: Diffuse endometrial thickening, consistent with primary endometrial carcinoma. Pelvic and abdominal retroperitoneal and retrocrural lymphadenopathy, consistent with metastatic disease. Moderate hepatic steatosis. Electronically Signed   By: Earle Gell M.D.   On: 09/22/2018 08:48   US Breast Ltd Uni Right Inc Axilla  Result Date: 09/22/2018 CLINICAL DATA:  Screening recall for possible right breast mass. EXAM: DIGITAL DIAGNOSTIC UNILATERAL RIGHT MAMMOGRAM WITH CAD AND TOMO RIGHT BREAST ULTRASOUND COMPARISON:  Previous exam(s). ACR  Breast Density Category b: There are  scattered areas of fibroglandular density. FINDINGS: Additional tomograms were performed of the right breast. The initially questioned possible right breast mass is less apparent appears low density with possible internal fat suggestive of a benign intramammary lymph node. Mammographic images were processed with CAD. Targeted ultrasound of the entire outer right breast was performed. There is an intramammary lymph node in the right at 9 o'clock 4 cm from nipple measuring 0.7 x 0.3 x 0.6 cm. This is felt to correspond well with the mass seen in the right breast at mammography. No suspicious abnormality seen in the outer right breast. IMPRESSION: No findings of malignancy in the right breast. RECOMMENDATION: Recommend annual routine screening mammography, due November 2020. I have discussed the findings and recommendations with the patient. Results were also provided in writing at the conclusion of the visit. If applicable, a reminder letter will be sent to the patient regarding the next appointment. BI-RADS CATEGORY  2: Benign. Electronically Signed   By: Everlean Alstrom M.D.   On: 09/22/2018 10:11   Mm Diag Breast Tomo Uni Right  Result Date: 09/22/2018 CLINICAL DATA:  Screening recall for possible right breast mass. EXAM: DIGITAL DIAGNOSTIC UNILATERAL RIGHT MAMMOGRAM WITH CAD AND TOMO RIGHT BREAST ULTRASOUND COMPARISON:  Previous exam(s). ACR Breast Density Category b: There are scattered areas of fibroglandular density. FINDINGS: Additional tomograms were performed of the right breast. The initially questioned possible right breast mass is less apparent appears low density with possible internal fat suggestive of a benign intramammary lymph node. Mammographic images were processed with CAD. Targeted ultrasound of the entire outer right breast was performed. There is an intramammary lymph node in the right at 9 o'clock 4 cm from nipple measuring 0.7 x 0.3 x 0.6 cm. This is  felt to correspond well with the mass seen in the right breast at mammography. No suspicious abnormality seen in the outer right breast. IMPRESSION: No findings of malignancy in the right breast. RECOMMENDATION: Recommend annual routine screening mammography, due November 2020. I have discussed the findings and recommendations with the patient. Results were also provided in writing at the conclusion of the visit. If applicable, a reminder letter will be sent to the patient regarding the next appointment. BI-RADS CATEGORY  2: Benign. Electronically Signed   By: Everlean Alstrom M.D.   On: 09/22/2018 10:11   .   Outpatient Encounter Medications as of 09/24/2018  Medication Sig  . diphenhydrAMINE (SOMINEX) 25 MG tablet Take 100 mg by mouth 2 (two) times daily.  Marland Kitchen gabapentin (NEURONTIN) 400 MG capsule TAKE 2 CAPSULES BY MOUTH 3 TIMES DAILY  . hydrochlorothiazide (MICROZIDE) 12.5 MG capsule Take 12.5 mg by mouth daily.  Marland Kitchen loratadine (CLARITIN) 10 MG tablet Take 10 mg by mouth daily as needed for allergies.  Marland Kitchen losartan (COZAAR) 50 MG tablet Take 50 mg by mouth daily.  Marland Kitchen oxyCODONE-acetaminophen (PERCOCET) 10-325 MG per tablet Take 2 tablets by mouth every 6 (six) hours.   . Turmeric 500 MG CAPS Take 2 capsules by mouth 3 (three) times daily. Patient states that she takes for joint pain  . [DISCONTINUED] diazepam (VALIUM) 10 MG tablet Take 1 tablet (10 mg total) by mouth every 6 (six) hours as needed. (Patient not taking: Reported on 09/24/2018)  . [DISCONTINUED] diclofenac sodium (VOLTAREN) 1 % GEL Apply 4 g topically daily as needed (knee pain).  . [DISCONTINUED] docusate sodium 100 MG CAPS Take 100 mg by mouth 2 (two) times daily. (Patient not taking: Reported on 09/24/2018)  . [DISCONTINUED]  gabapentin (NEURONTIN) 300 MG capsule Take 600 mg by mouth every 8 (eight) hours.  . [DISCONTINUED] Multiple Vitamins-Minerals (MULTIVITAMIN WITH MINERALS) tablet Take 1 tablet by mouth daily.  . [DISCONTINUED]  OVER THE COUNTER MEDICATION Take 2 capsules by mouth daily. Cartilast with ASU  . [DISCONTINUED] oxyCODONE-acetaminophen (PERCOCET) 10-325 MG per tablet Take 1 tablet by mouth every 4 (four) hours as needed for pain. (Patient not taking: Reported on 09/24/2018)  . [DISCONTINUED] Tapentadol HCl (NUCYNTA ER) 200 MG TB12 Take 200 mg by mouth 2 (two) times daily.   No facility-administered encounter medications on file as of 09/24/2018.    No Known Allergies  Past Medical History:  Diagnosis Date  . Arthritis   . Hypertension    Past Surgical History:  Procedure Laterality Date  . ANKLE SURGERY Right    right fusion  . BACK SURGERY     5 surgeries at [facility]; has screws and a "cage"  . CARPAL TUNNEL RELEASE Bilateral   . COLONOSCOPY    . KNEE SURGERY Left   . LAPAROSCOPIC CHOLECYSTECTOMY    . LUMBAR LAMINECTOMY/DECOMPRESSION MICRODISCECTOMY Left 08/06/2013   Procedure: LUMBAR LAMINECTOMY/DECOMPRESSION MICRODISCECTOMY 1 LEVEL;  Surgeon: Ophelia Charter, MD;  Location: [facility];  Service: Neurosurgery;  Laterality: Left;  redo LEFT Lumbar Five-Sacral One diskectomy  . PLACEMENT OF LUMBAR DRAIN N/A 08/07/2013   Procedure: PLACEMENT OF LUMBAR DRAIN;  Surgeon: Ophelia Charter, MD;  Location: [place];  Service: Neurosurgery;  Laterality: N/A;  . TONSILLECTOMY    . TUBAL LIGATION  1982        Past Gynecological History:   GYNECOLOGIC HISTORY:  . No LMP recorded. Patient is postmenopausal. 67 yo . Menarche: 67 years old . P 2 . Contraceptive none . HRT none  . Last Pap referral and then states piror to this was 2 years ago; always normal Family Hx:  Family History  Problem Relation Age of Onset  . Alzheimer's disease Mother   . Colon cancer Father 69  . Heart Problems Father   . Diabetes Maternal Aunt    Social Hx:  Marland Kitchen Tobacco use: none . Alcohol use: holidays . Illicit Drug use: none . Illicit IV Drug use: none    Review of Systems: Review of Systems  Musculoskeletal:  Positive for arthralgias and back pain.  All other systems reviewed and are negative.   Vitals:  Vitals:   09/24/18 1103  BP: (!) 134/92  Pulse: 75  Resp: 20  Temp: 98.5 F (36.9 C)  SpO2: 99%   Vitals:   09/24/18 1103  Weight: (!) 138.3 kg  Height: 5\' 3"  (1.6 m)   Body mass index is 54.03 kg/m.  Physical Exam: General :  Obese, Well developed, 67 y.o., female in no apparent distress HEENT:  Normocephalic/atraumatic, symmetric, EOMI, eyelids normal Neck:   Supple, no masses.  Lymphatics:  No cervical/ submandibular/ supraclavicular/ infraclavicular/ inguinal adenopathy Respiratory:  Respirations unlabored, no use of accessory muscles CV:   Deferred Breast:  Deferred Musculoskeletal: No CVA tenderness, normal muscle strength. Abdomen:  Obese, Soft, non-tender and nondistended. No evidence of hernia. No masses. Extremities:  Obese. No lymphedema, no erythema, non-tender. Skin:   Normal inspection Neuro/Psych:  No focal motor deficit, no abnormal mental status. Normal gait. Normal affect. Alert and oriented to person, place, and time  Genito Urinary: Vulva: Normal external female genitalia.  Bladder/urethra: Urethral meatus normal in size and location. No lesions or   masses, well supported bladder Speculum exam: Vagina: No  lesion, no discharge, no bleeding.  Cervix: Firm, external cervical mucosa with ecchymosis, suspect 2/2 manipulation from biopsies, but no palpable external lesion.  Bimanual exam: The cervix is somewhat barrell shaped and hard but the uterus is completely mobile and feels slightly enlarged.   Adnexal region: No masses. Rectovaginal:  Good tone, no masses, no cul de sac nodularity, no parametrial involvement or nodularity.   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   Plan  Complexity of visit ? This is a new problem and additional workup is planned ? Including  PET, biopsy, CTimaging ? Data reviewed ? I independently reviewed the images and the radiology reports and discussed my interpretation in the presence of the patient and her 2 children today ? The nodal disease extends to the left nodes near the renal vessels and may even involve a supradiagphragmatic node ? I reviewed her referring doctor's office notes and I have summarized in the HPI ? History was obtained from the patient and the chart ? We reviewed her pathology reports today in detail ? This is an acute illness that may pose a threat to life if left untreated ? Management will be complex and involve chemotherapy  1. Etiology of disease ? We discussed the pathology leaning toward endometrial primary ? Clinically this would make more sense than cervical, despite the p16 staining, given her prior normal Pap smears ? However to have such diffuse metastatic disease given the bleeding just started in October and pathology favoring a Type I endometrial cancer it seems odd ? I think her nodes deserve further assessment to rule out a non-Gyn primary ? CTguided IR biopsy will be requested. 2. Assuming metastatic endometrial cancer ? Recommend systemic therapy with IV Carbo/Taxol ? Tumor profiling deferred to [facility]) Dr. Alvy Bimler with consideration for immunotherapy if indicated based on response/tolerance to Carbo/Tax ? Her nodal disease would not be completely debulked given its location. 3. RTC 3 months o Refer back sooner if surgery for nodal assessment or hysterectomy felt necessary. 4. For today I have ordered imaging for followup, biopsy TBS, referral to Medical Oncology   Mart Piggs, MD Gynecologic Oncologist 09/24/2018, 1:08 PM    Cc: Paula Compton, MD (Referring Ob/Gyn) Ronita Hipps, MD  (PCP)

## 2018-09-25 ENCOUNTER — Telehealth: Payer: Self-pay | Admitting: Oncology

## 2018-09-25 NOTE — Telephone Encounter (Signed)
Currie Paris and scheduled appointment with Dr. Alvy Bimler for Friday, 09/26/18 at 10:15.  She verbalized understanding and agreement.

## 2018-09-25 NOTE — Telephone Encounter (Signed)
Requested MSI testing on accession [id number] with Robin at [facility].  She will fax the results when complete to [phone].

## 2018-09-26 ENCOUNTER — Telehealth: Payer: Self-pay | Admitting: Oncology

## 2018-09-26 ENCOUNTER — Inpatient Hospital Stay: Payer: Medicare Other | Admitting: Hematology and Oncology

## 2018-09-26 ENCOUNTER — Telehealth: Payer: Self-pay | Admitting: Hematology and Oncology

## 2018-09-26 ENCOUNTER — Encounter: Payer: Self-pay | Admitting: Oncology

## 2018-09-26 ENCOUNTER — Encounter: Payer: Self-pay | Admitting: Hematology and Oncology

## 2018-09-26 VITALS — BP 138/90 | HR 75 | Temp 98.0°F | Resp 17 | Ht 63.0 in | Wt 306.0 lb

## 2018-09-26 DIAGNOSIS — R591 Generalized enlarged lymph nodes: Secondary | ICD-10-CM | POA: Diagnosis not present

## 2018-09-26 DIAGNOSIS — C541 Malignant neoplasm of endometrium: Secondary | ICD-10-CM

## 2018-09-26 DIAGNOSIS — I10 Essential (primary) hypertension: Secondary | ICD-10-CM

## 2018-09-26 DIAGNOSIS — G8929 Other chronic pain: Secondary | ICD-10-CM | POA: Insufficient documentation

## 2018-09-26 DIAGNOSIS — R599 Enlarged lymph nodes, unspecified: Secondary | ICD-10-CM

## 2018-09-26 DIAGNOSIS — M549 Dorsalgia, unspecified: Secondary | ICD-10-CM

## 2018-09-26 DIAGNOSIS — G63 Polyneuropathy in diseases classified elsewhere: Secondary | ICD-10-CM

## 2018-09-26 DIAGNOSIS — R5381 Other malaise: Secondary | ICD-10-CM | POA: Diagnosis not present

## 2018-09-26 MED ORDER — PROCHLORPERAZINE MALEATE 10 MG PO TABS: 10.0000 mg | ORAL_TABLET | Freq: Four times a day (QID) | ORAL | 1 refills | Status: DC | PRN

## 2018-09-26 MED ORDER — ONDANSETRON HCL 8 MG PO TABS: 8.0000 mg | ORAL_TABLET | Freq: Three times a day (TID) | ORAL | 1 refills | Status: DC | PRN

## 2018-09-26 MED ORDER — DEXAMETHASONE 4 MG PO TABS: ORAL_TABLET | ORAL | 0 refills | Status: DC

## 2018-09-26 MED ORDER — LIDOCAINE-PRILOCAINE 2.5-2.5 % EX CREA: TOPICAL_CREAM | CUTANEOUS | 3 refills | Status: DC

## 2018-09-26 MED FILL — LIDOCAINE-PRILOCAINE CREAM: 2.5-2.5 | 30 days supply | Qty: 30 | Fill #0

## 2018-09-26 MED FILL — DEXAMETHASONE 4 MG TABLET: 4 | 21 days supply | Qty: 60 | Fill #0

## 2018-09-26 MED FILL — PROCHLORPERAZINE 10 MG TAB: 10 | 7 days supply | Qty: 30 | Fill #0

## 2018-09-26 MED FILL — ONDANSETRON HCL 8 MG TABLET: 8 | 10 days supply | Qty: 30 | Fill #0

## 2018-09-26 NOTE — Progress Notes (Signed)
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

## 2018-09-26 NOTE — Assessment & Plan Note (Addendum)
The calculated chemotherapy dose will be very high I plan to keep maximum dose of carboplatin at 750 mg and to reduce the dose of paclitaxel I also given her resources for dietary modification while on treatment due to high risk of severe hyperglycemia while on treatment

## 2018-09-26 NOTE — Assessment & Plan Note (Signed)
She is quite disabled from her chronic back pain and neuropathy She also have history of falls I recommend physical therapy evaluation and treatment while she is undergoing chemotherapy and she agreed with the plan of care

## 2018-09-26 NOTE — Assessment & Plan Note (Addendum)
I have reviewed her case extensively We discussed the risk and benefits of completion imaging study with CT scan of the chest I do not felt that PET CT scan would be beneficial and it might delay initiation of treatment She agreed to discontinue PET CT scan We also discussed the risk and benefits of lymph node biopsy and she agreed to forego lymph node biopsy I recommend port placement, chemo education class and blood work next week with plan to start her on chemotherapy next Friday after CT scan of the chest is done I gave her prescription for cranial prosthesis in anticipation for chemotherapy induced hair loss I have requested MSI testing to be done on her endometrial biopsy

## 2018-09-26 NOTE — Assessment & Plan Note (Signed)
This is not likely related to secondary malignancy such as lymphoma Given her morbid obesity and the proximity of the lymph node close to the aorta, I am fearful we might run into complication from biopsy and she agreed not to pursue a biopsy

## 2018-09-26 NOTE — Assessment & Plan Note (Signed)
She has severe chronic back pain and is currently being managed by Dr. Hardin Negus and pain management clinic It is likely that she might have exacerbation of chronic pain while undergoing chemotherapy

## 2018-09-26 NOTE — Assessment & Plan Note (Signed)
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

## 2018-09-26 NOTE — Telephone Encounter (Signed)
Gave avs and calendar ° °

## 2018-09-26 NOTE — Telephone Encounter (Signed)
Advised patient's daughter, Magda Paganini, about port appointment on Wednesday, 10/01/18 with arrival at [facility] at 8 am, NPO after midnight and needs a driver.  Also let her know that the biopsy and PET scan has been canceled per Dr. Alvy Bimler.  She verbalized understanding and agreement.

## 2018-09-26 NOTE — Progress Notes (Signed)
[place] CONSULT NOTE  Patient Care Team: Ronita Hipps, MD as PCP - General (Family Medicine) Nicholaus Bloom, MD (Anesthesiology)  ASSESSMENT & PLAN:  Endometrial cancer [facility]) I have reviewed her case extensively We discussed the risk and benefits of completion imaging study with CT scan of the chest I do not felt that PET CT scan would be beneficial and it might delay initiation of treatment She agreed to discontinue PET CT scan We also discussed the risk and benefits of lymph node biopsy and she agreed to forego lymph node biopsy I recommend port placement, chemo education class and blood work next week with plan to start her on chemotherapy next Friday after CT scan of the chest is done I gave her prescription for cranial prosthesis in anticipation for chemotherapy induced hair loss I have requested MSI testing to be done on her endometrial biopsy  Adenopathy This is not likely related to secondary malignancy such as lymphoma Given her morbid obesity and the proximity of the lymph node close to the aorta, I am fearful we might run into complication from biopsy and she agreed not to pursue a biopsy  Chronic back pain greater than 3 months duration She has severe chronic back pain and is currently being managed by Dr. 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 Chemotherapy can cause exacerbation of neuropathy I plan modest dose adjustment of paclitaxel  Physical debility She is quite disabled from her chronic back pain and neuropathy She also have history of falls I recommend physical therapy evaluation and treatment while she is undergoing chemotherapy and she agreed with the plan of care  Morbid (severe) obesity due to excess calories ([place]) The  calculated chemotherapy dose will be very high I plan to keep maximum dose of carboplatin at 750 mg and to reduce the dose of paclitaxel I also given her resources for dietary modification while on treatment due to high risk of severe hyperglycemia while on treatment   Orders Placed This Encounter  Procedures  . IR IMAGING GUIDED PORT INSERTION    Standing Status:   Future    Standing Expiration Date:   11/28/2019    Order Specific Question:   Reason for Exam (SYMPTOM  OR DIAGNOSIS REQUIRED)    Answer:   need port to start chemo 12/20    Order Specific Question:   Preferred Imaging Location?    Answer:   [facility]  . CBC with Differential ([place] Only)    Standing Status:   Standing    Number of Occurrences:   20    Standing Expiration Date:   09/27/2019  . CMP ([place] only)    Standing Status:   Standing    Number of Occurrences:   20    Standing Expiration Date:   09/27/2019  . CA 125    Standing Status:   Standing    Number of Occurrences:   9    Standing Expiration Date:   09/27/2019  . Ambulatory referral to Physical Therapy    Referral Priority:   Routine    Referral Type:   Physical Medicine    Referral Reason:   Specialty Services Required    Requested Specialty:   Physical Therapy    Number of Visits Requested:   1  CHIEF COMPLAINTS/PURPOSE OF CONSULTATION:  Newly diagnosed endometrial cancer, for further management  HISTORY OF PRESENTING ILLNESS:  Kathryn [name] 67 y.o. female is here because of recent diagnosis of cancer. "Kathryn [name]" is here accompanied by her daughter, Magda Paganini.  She is to retire Psychologist, clinical.  She has 1 other son.  She is currently widowed. The patient was diagnosed after presentation with postmenopausal bleeding. She underwent further evaluation, CT imaging and subsequent referral to the cancer center. Since her recent biopsy, she has sporadic vaginal spotting I have reviewed her chart and materials related to her cancer  extensively and collaborated history with the patient. Summary of oncologic history is as follows:   Endometrial cancer ([place])   07/15/2018 Initial Diagnosis    She noted postmenopausal bleeding for the first time in October 2019. A Pap was done 09/02/18 showign [facility]. She was referred onto GYN and seen by Dr. Paula Compton     09/09/2018 Procedure    She underwent endometrial biopsy    09/12/2018 Pathology Results    Endometrial biopsy positive for adenocarcinoma    09/22/2018 Imaging    Ct abdomen and pelvis showed: Diffuse endometrial thickening, consistent with primary endometrial carcinoma.  Pelvic and abdominal retroperitoneal and retrocrural lymphadenopathy, consistent with metastatic disease.  Moderate hepatic steatosis.    The patient is quite debilitated due to severe chronic back pain.  She is currently attending pain management clinic and is prescribed oxycodone along with gabapentin for neuropathy.  Her pain typically radiate from the lower back to the left leg.  She described the neuropathy as numbness affecting the last 2 toes on the left foot. She lives by herself and is able to do most activities of daily living.  She had multiple falls in the past, the last one was over 6 months ago. She has a walker and a cane but those are not prescribed. She has urinary incontinence that is not new She denies abnormal appetite or weight loss.  No changes in bowel habits.  MEDICAL HISTORY:  Past Medical History:  Diagnosis Date  . Arthritis   . Hypertension     SURGICAL HISTORY: Past Surgical History:  Procedure Laterality Date  . ANKLE SURGERY Right    right fusion  . BACK SURGERY     5 surgeries at [facility]; has screws and a "cage"  . CARPAL TUNNEL RELEASE Bilateral   . COLONOSCOPY    . KNEE SURGERY Left   . LAPAROSCOPIC CHOLECYSTECTOMY    . LUMBAR LAMINECTOMY/DECOMPRESSION MICRODISCECTOMY Left 08/06/2013   Procedure: LUMBAR LAMINECTOMY/DECOMPRESSION MICRODISCECTOMY 1  LEVEL;  Surgeon: Ophelia Charter, MD;  Location: [place] NEURO ORS;  Service: Neurosurgery;  Laterality: Left;  redo LEFT Lumbar Five-Sacral One diskectomy  . PLACEMENT OF LUMBAR DRAIN N/A 08/07/2013   Procedure: PLACEMENT OF LUMBAR DRAIN;  Surgeon: Ophelia Charter, MD;  Location: [place];  Service: Neurosurgery;  Laterality: N/A;  . TONSILLECTOMY    . TUBAL LIGATION  1982    SOCIAL HISTORY: Social History   Socioeconomic History  . Marital status: Widowed    Spouse name: Not on file  . Number of children: 2  . Years of education: Not on file  . Highest education level: Not on file  Occupational History  . Occupation: retired Dance movement psychotherapist  Social Needs  . Financial resource strain: Not on file  . Food insecurity:    Worry: Not on file    Inability: Not on file  . Transportation needs:    Medical: Not  on file    Non-medical: Not on file  Tobacco Use  . Smoking status: Never Smoker  . Smokeless tobacco: Never Used  Substance and Sexual Activity  . Alcohol use: Yes    Comment: holidays  . Drug use: No  . Sexual activity: Not on file  Lifestyle  . Physical activity:    Days per week: Not on file    Minutes per session: Not on file  . Stress: Not on file  Relationships  . Social connections:    Talks on phone: Not on file    Gets together: Not on file    Attends religious service: Not on file    Active member of club or organization: Not on file    Attends meetings of clubs or organizations: Not on file    Relationship status: Not on file  . Intimate partner violence:    Fear of current or ex partner: Not on file    Emotionally abused: Not on file    Physically abused: Not on file    Forced sexual activity: Not on file  Other Topics Concern  . Not on file  Social History Narrative  . Not on file    FAMILY HISTORY: Family History  Problem Relation Age of Onset  . Alzheimer's disease Mother   . Colon cancer Father 8  . Heart Problems Father   . Diabetes  Maternal Aunt     ALLERGIES:  has No Known Allergies.  MEDICATIONS:  Current Outpatient Medications  Medication Sig Dispense Refill  . dexamethasone (DECADRON) 4 MG tablet Take 5 tabs at the night before and 5 tab the morning of chemotherapy, every 3 weeks, by mouth 60 tablet 0  . diphenhydrAMINE (SOMINEX) 25 MG tablet Take 100 mg by mouth 2 (two) times daily. Take for itching    . gabapentin (NEURONTIN) 400 MG capsule TAKE 2 CAPSULES BY MOUTH 3 TIMES DAILY  1  . hydrochlorothiazide (MICROZIDE) 12.5 MG capsule Take 12.5 mg by mouth daily.  0  . lidocaine-prilocaine (EMLA) cream Apply to affected area once 30 g 3  . loratadine (CLARITIN) 10 MG tablet Take 10 mg by mouth daily as needed for allergies.    Marland Kitchen losartan (COZAAR) 50 MG tablet Take 50 mg by mouth daily.  2  . ondansetron (ZOFRAN) 8 MG tablet Take 1 tablet (8 mg total) by mouth every 8 (eight) hours as needed for refractory nausea / vomiting. Start on day 3 after chemo. 30 tablet 1  . oxyCODONE-acetaminophen (PERCOCET) 10-325 MG per tablet Take 2 tablets by mouth every 6 (six) hours.     . prochlorperazine (COMPAZINE) 10 MG tablet Take 1 tablet (10 mg total) by mouth every 6 (six) hours as needed (Nausea or vomiting). 30 tablet 1   No current facility-administered medications for this visit.     REVIEW OF SYSTEMS:   Constitutional: Denies fevers, chills or abnormal night sweats Eyes: Denies blurriness of vision, double vision or watery eyes Ears, nose, mouth, throat, and face: Denies mucositis or sore throat Respiratory: Denies cough, dyspnea or wheezes Cardiovascular: Denies palpitation, chest discomfort or lower extremity swelling Gastrointestinal:  Denies nausea, heartburn or change in bowel habits Skin: Denies abnormal skin rashes Lymphatics: Denies new lymphadenopathy or easy bruising Behavioral/Psych: Mood is stable, no new changes  All other systems were reviewed with the patient and are negative.  PHYSICAL  EXAMINATION: ECOG PERFORMANCE STATUS: 2 - Symptomatic, <50% confined to bed  Vitals:   09/26/18 1006  BP: 138/90  Pulse: 75  Resp: 17  Temp: 98 F (36.7 C)  SpO2: 98%   Filed Weights   09/26/18 1006  Weight: (!) 306 lb (138.8 kg)    GENERAL:alert, no distress and comfortable.  She is morbidly obese SKIN: skin color, texture, turgor are normal, no rashes or significant lesions EYES: normal, conjunctiva are pink and non-injected, sclera clear OROPHARYNX:no exudate, no erythema and lips, buccal mucosa, and tongue normal  NECK: supple, thyroid normal size, non-tender, without nodularity LYMPH:  no palpable lymphadenopathy in the cervical, axillary or inguinal LUNGS: clear to auscultation and percussion with normal breathing effort HEART: regular rate & rhythm and no murmurs and no lower extremity edema ABDOMEN:abdomen soft, non-tender and normal bowel sounds Musculoskeletal:no cyanosis of digits and no clubbing  PSYCH: alert & oriented x 3 with fluent speech NEURO: no focal motor/sensory deficits  LABORATORY DATA:  I have reviewed the data as listed Lab Results  Component Value Date   WBC 6.2 07/31/2013   HGB 14.2 07/31/2013   HCT 41.3 07/31/2013   MCV 90.6 07/31/2013   PLT 213 07/31/2013   Recent Labs    09/24/18 1303  NA 141  K 3.9  CL 103  CO2 27  GLUCOSE 103*  BUN 14  CREATININE 0.82  CALCIUM 9.6  GFRNONAA >60  GFRAA >60  PROT 8.3*  ALBUMIN 4.2  AST 20  ALT 17  ALKPHOS 121  BILITOT 0.5    RADIOGRAPHIC STUDIES: I have personally reviewed the radiological images as listed and agreed with the findings in the report. Ct Abdomen Pelvis W Contrast  Result Date: 09/22/2018 CLINICAL DATA:  Newly diagnosed endometrial carcinoma.  Staging. Creatinine was obtained on site at [facility] at [address] Results: Creatinine 0.7 mg/dL. EXAM: CT ABDOMEN AND PELVIS WITH CONTRAST TECHNIQUE: Multidetector CT imaging of the abdomen and pelvis was performed  using the standard protocol following bolus administration of intravenous contrast. CONTRAST:  174m ISOVUE-300 IOPAMIDOL (ISOVUE-300) INJECTION 61% COMPARISON:  None. FINDINGS: Lower Chest: No acute findings. Hepatobiliary: No hepatic masses identified. Two small cysts are seen in the left hepatic lobe. Moderate diffuse hepatic steatosis noted. Prior cholecystectomy. No evidence of biliary obstruction. Pancreas:  No mass or inflammatory changes. Spleen: Within normal limits in size and appearance. Adrenals/Urinary Tract: No masses identified. No evidence of hydronephrosis. Unremarkable unopacified urinary bladder. Stomach/Bowel: No evidence of obstruction, inflammatory process or abnormal fluid collections. Mild diverticulosis is seen involving the descending and sigmoid colon, without evidence of diverticulitis. Vascular/Lymphatic: Pelvic lymphadenopathy is seen in the left common and external iliac chains, with largest lymph node in the left common iliac region measuring 2.5 cm on image 57/2. Mild right external iliac lymphadenopathy is seen with largest lymph node measuring 1.8 cm on image 62/2. Retroperitoneal lymphadenopathy is seen throughout the aortocaval and left paraaortic spaces, with largest lymph node measuring 1.7 cm on image 39/2. A 10 mm right retrocaval lymph node is also seen. No abdominal aortic aneurysm. Reproductive: Diffuse endometrial thickening is seen measuring approximately 25 mm, consistent with known endometrial carcinoma. No evidence of extra uterine extension. Adnexal regions are unremarkable. Other:  None. Musculoskeletal:  No suspicious bone lesions identified. IMPRESSION: Diffuse endometrial thickening, consistent with primary endometrial carcinoma. Pelvic and abdominal retroperitoneal and retrocrural lymphadenopathy, consistent with metastatic disease. Moderate hepatic steatosis. Electronically Signed   By: JEarle GellM.D.   On: 09/22/2018 08:48   [employer] [facility] [employer]  Axilla  Result Date: 09/22/2018 CLINICAL DATA:  Screening recall  for possible right breast mass. EXAM: DIGITAL DIAGNOSTIC UNILATERAL RIGHT MAMMOGRAM WITH CAD AND TOMO RIGHT BREAST ULTRASOUND COMPARISON:  Previous exam(s). ACR Breast Density Category b: There are scattered areas of fibroglandular density. FINDINGS: Additional tomograms were performed of the right breast. The initially questioned possible right breast mass is less apparent appears low density with possible internal fat suggestive of a benign intramammary lymph node. Mammographic images were processed with CAD. Targeted ultrasound of the entire outer right breast was performed. There is an intramammary lymph node in the right at 9 o'clock 4 cm from nipple measuring 0.7 x 0.3 x 0.6 cm. This is felt to correspond well with the mass seen in the right breast at mammography. No suspicious abnormality seen in the outer right breast. IMPRESSION: No findings of malignancy in the right breast. RECOMMENDATION: Recommend annual routine screening mammography, due November 2020. I have discussed the findings and recommendations with the patient. Results were also provided in writing at the conclusion of the visit. If applicable, a reminder letter will be sent to the patient regarding the next appointment. BI-RADS CATEGORY  2: Benign. Electronically Signed   By: Everlean Alstrom M.D.   On: 09/22/2018 10:11   Mm Diag Breast Tomo Uni Right  Result Date: 09/22/2018 CLINICAL DATA:  Screening recall for possible right breast mass. EXAM: DIGITAL DIAGNOSTIC UNILATERAL RIGHT MAMMOGRAM WITH CAD AND TOMO RIGHT BREAST ULTRASOUND COMPARISON:  Previous exam(s). ACR Breast Density Category b: There are scattered areas of fibroglandular density. FINDINGS: Additional tomograms were performed of the right breast. The initially questioned possible right breast mass is less apparent appears low density with possible internal fat suggestive of a benign intramammary lymph node.  Mammographic images were processed with CAD. Targeted ultrasound of the entire outer right breast was performed. There is an intramammary lymph node in the right at 9 o'clock 4 cm from nipple measuring 0.7 x 0.3 x 0.6 cm. This is felt to correspond well with the mass seen in the right breast at mammography. No suspicious abnormality seen in the outer right breast. IMPRESSION: No findings of malignancy in the right breast. RECOMMENDATION: Recommend annual routine screening mammography, due November 2020. I have discussed the findings and recommendations with the patient. Results were also provided in writing at the conclusion of the visit. If applicable, a reminder letter will be sent to the patient regarding the next appointment. BI-RADS CATEGORY  2: Benign. Electronically Signed   By: Everlean Alstrom M.D.   On: 09/22/2018 10:11    I spent 60 minutes counseling the patient face to face. The total time spent in the appointment was 80 minutes and more than 50% was on counseling.  All questions were answered. The patient knows to call the clinic with any problems, questions or concerns.  Heath Lark, MD 09/26/2018 2:07 PM

## 2018-09-29 ENCOUNTER — Telehealth: Payer: Self-pay

## 2018-09-29 NOTE — Telephone Encounter (Signed)
Incoming call from pt - helped her with confirmation of upcoming appts including IR appt is at [facility] on 12/18 at 8 am, not [place].  No other needs per pt at this time.

## 2018-09-30 ENCOUNTER — Other Ambulatory Visit: Payer: Self-pay | Admitting: Radiology

## 2018-10-01 ENCOUNTER — Encounter (HOSPITAL_COMMUNITY): Payer: Self-pay

## 2018-10-01 ENCOUNTER — Ambulatory Visit (HOSPITAL_COMMUNITY): Payer: Medicare Other

## 2018-10-01 ENCOUNTER — Ambulatory Visit (HOSPITAL_COMMUNITY)
Admission: RE | Admit: 2018-10-01 | Discharge: 2018-10-01 | Disposition: A | Discharge status: Home / Self Care | Payer: Medicare Other | Source: Ambulatory Visit | Attending: Hematology and Oncology | Admitting: Hematology and Oncology

## 2018-10-01 ENCOUNTER — Telehealth: Payer: Self-pay | Admitting: *Deleted

## 2018-10-01 ENCOUNTER — Other Ambulatory Visit: Payer: Self-pay

## 2018-10-01 DIAGNOSIS — M1990 Unspecified osteoarthritis, unspecified site: Secondary | ICD-10-CM | POA: Diagnosis not present

## 2018-10-01 DIAGNOSIS — Z9851 Tubal ligation status: Secondary | ICD-10-CM | POA: Diagnosis not present

## 2018-10-01 DIAGNOSIS — Z8249 Family history of ischemic heart disease and other diseases of the circulatory system: Secondary | ICD-10-CM | POA: Insufficient documentation

## 2018-10-01 DIAGNOSIS — Z5111 Encounter for antineoplastic chemotherapy: Secondary | ICD-10-CM | POA: Diagnosis not present

## 2018-10-01 DIAGNOSIS — Z79899 Other long term (current) drug therapy: Secondary | ICD-10-CM | POA: Insufficient documentation

## 2018-10-01 DIAGNOSIS — I10 Essential (primary) hypertension: Secondary | ICD-10-CM | POA: Diagnosis not present

## 2018-10-01 DIAGNOSIS — C541 Malignant neoplasm of endometrium: Secondary | ICD-10-CM

## 2018-10-01 HISTORY — PX: IR IMAGING GUIDED PORT INSERTION: IMG5740

## 2018-10-01 LAB — CBC
HEMATOCRIT: 44.7 % (ref 36.0–46.0)
Hemoglobin: 14.6 g/dL (ref 12.0–15.0)
MCH: 29.5 pg (ref 26.0–34.0)
MCHC: 32.7 g/dL (ref 30.0–36.0)
MCV: 90.3 fL (ref 80.0–100.0)
Platelets: 276 10*3/uL (ref 150–400)
RBC: 4.95 MIL/uL (ref 3.87–5.11)
RDW: 14.1 % (ref 11.5–15.5)
WBC: 6.6 10*3/uL (ref 4.0–10.5)
nRBC: 0 % (ref 0.0–0.2)

## 2018-10-01 LAB — PROTIME-INR
INR: 0.98
Prothrombin Time: 12.9 seconds (ref 11.4–15.2)

## 2018-10-01 LAB — BASIC METABOLIC PANEL
Anion gap: 13 (ref 5–15)
BUN: 11 mg/dL (ref 8–23)
CHLORIDE: 99 mmol/L (ref 98–111)
CO2: 26 mmol/L (ref 22–32)
Calcium: 9.1 mg/dL (ref 8.9–10.3)
Creatinine, Ser: 0.79 mg/dL (ref 0.44–1.00)
GFR calc Af Amer: >60 mL/min (ref >60)
GFR calc non Af Amer: >60 mL/min (ref >60)
Glucose, Bld: 122 mg/dL — ABNORMAL HIGH (ref 70–99)
Potassium: 3.8 mmol/L (ref 3.5–5.1)
Sodium: 138 mmol/L (ref 135–145)

## 2018-10-01 MED ORDER — HEPARIN SOD (PORK) LOCK FLUSH 100 UNIT/ML IV SOLN
INTRAVENOUS | Status: AC
  Administered 2018-10-01: 500 [IU]
  Filled 2018-10-01: qty 5

## 2018-10-01 MED ORDER — CEFAZOLIN SODIUM-DEXTROSE 2-4 GM/100ML-% IV SOLN
INTRAVENOUS | Status: AC
  Filled 2018-10-01: qty 100

## 2018-10-01 MED ORDER — FENTANYL CITRATE (PF) 100 MCG/2ML IJ SOLN
INTRAMUSCULAR | Status: AC | PRN
  Administered 2018-10-01: 50 ug via INTRAVENOUS
  Administered 2018-10-01: 25 ug via INTRAVENOUS

## 2018-10-01 MED ORDER — LIDOCAINE-EPINEPHRINE 2 %-1:100000 IJ SOLN
INTRAMUSCULAR | Status: AC | PRN
  Administered 2018-10-01: 10 mL

## 2018-10-01 MED ORDER — DEXTROSE 5 % IV SOLN
3.0000 g | INTRAVENOUS | Status: AC
  Administered 2018-10-01: 3 g via INTRAVENOUS
  Filled 2018-10-01: qty 3

## 2018-10-01 MED ORDER — FENTANYL CITRATE (PF) 100 MCG/2ML IJ SOLN
INTRAMUSCULAR | Status: AC
  Filled 2018-10-01: qty 2

## 2018-10-01 MED ORDER — SODIUM CHLORIDE 0.9 % IV SOLN: INTRAVENOUS | Status: DC

## 2018-10-01 MED ORDER — MIDAZOLAM HCL 2 MG/2ML IJ SOLN
INTRAMUSCULAR | Status: AC
  Filled 2018-10-01: qty 2

## 2018-10-01 MED ORDER — LIDOCAINE-EPINEPHRINE (PF) 1 %-1:200000 IJ SOLN
INTRAMUSCULAR | Status: AC
  Filled 2018-10-01: qty 30

## 2018-10-01 MED ORDER — MIDAZOLAM HCL 2 MG/2ML IJ SOLN
INTRAMUSCULAR | Status: AC | PRN
  Administered 2018-10-01 (×2): 1 mg via INTRAVENOUS

## 2018-10-01 NOTE — Procedures (Signed)
Interventional Radiology Procedure Note  Procedure: Placement of a right IJ approach single lumen PowerPort.  Tip is positioned at the superior cavoatrial junction and catheter is ready for immediate use.  Complications: No immediate Recommendations:  - Ok to shower tomorrow - Do not submerge for 7 days - Routine line care   Signed,  Zaylynn Rickett K. Savaughn Karwowski, MD   

## 2018-10-01 NOTE — Telephone Encounter (Signed)
Fax 12/11 office note to Dr. Marvel Plan

## 2018-10-01 NOTE — H&P (Signed)
Chief Complaint: Patient was seen in consultation today for [facility] a Cath placement at the request of [place]  Referring Physician(s): Heath Lark  Supervising Physician: Jacqulynn Cadet  Patient Status: [facility] - Out-pt  History of Present Illness: Kathryn [name] is a 67 y.o. female   New diagnosis Uterine Ca Scheduled to start [facility]i [facility] placement in IR today    Past Medical History:  Diagnosis Date  . Arthritis   . Hypertension     Past Surgical History:  Procedure Laterality Date  . ANKLE SURGERY Right    right fusion  . BACK SURGERY     5 surgeries at [facility]; has screws and a "cage"  . CARPAL TUNNEL RELEASE Bilateral   . COLONOSCOPY    . KNEE SURGERY Left   . LAPAROSCOPIC CHOLECYSTECTOMY    . LUMBAR LAMINECTOMY/DECOMPRESSION MICRODISCECTOMY Left 08/06/2013   Procedure: LUMBAR LAMINECTOMY/DECOMPRESSION MICRODISCECTOMY 1 LEVEL;  Surgeon: Ophelia Charter, MD;  Location: [facility];  Service: Neurosurgery;  Laterality: Left;  redo LEFT Lumbar Five-Sacral One diskectomy  . PLACEMENT OF LUMBAR DRAIN N/A 08/07/2013   Procedure: PLACEMENT OF LUMBAR DRAIN;  Surgeon: Ophelia Charter, MD;  Location: [facility];  Service: Neurosurgery;  Laterality: N/A;  . TONSILLECTOMY    . TUBAL LIGATION  1982    Allergies: Patient has no known allergies.  Medications: Prior to Admission medications   Medication Sig Start Date End Date Taking? Authorizing Provider  diphenhydrAMINE (SOMINEX) 25 MG tablet Take 100 mg by mouth 2 (two) times daily.    Yes [provider]  gabapentin (NEURONTIN) 400 MG capsule Take 800 mg by mouth 3 (three) times daily.  08/21/18  Yes [provider]  hydrochlorothiazide (MICROZIDE) 12.5 MG capsule Take 12.5 mg by mouth daily. 09/02/18  Yes [provider]  loratadine (CLARITIN) 10 MG tablet Take 10 mg by mouth daily as needed for allergies.   Yes [provider]  losartan (COZAAR) 50 MG tablet Take 50 mg by  mouth daily. 09/02/18  Yes [provider]  oxyCODONE-acetaminophen (PERCOCET) 10-325 MG per tablet Take 2 tablets by mouth every 8 (eight) hours as needed for pain.    Yes [provider]  dexamethasone (DECADRON) 4 MG tablet Take 5 tabs at the night before and 5 tab the morning of chemotherapy, every 3 weeks, by mouth 09/26/18   Heath Lark, MD  lidocaine-prilocaine (EMLA) cream Apply to affected area once 09/26/18   Heath Lark, MD  ondansetron (ZOFRAN) 8 MG tablet Take 1 tablet (8 mg total) by mouth every 8 (eight) hours as needed for refractory nausea / vomiting. Start on day 3 after chemo. 09/26/18   Heath Lark, MD  prochlorperazine (COMPAZINE) 10 MG tablet Take 1 tablet (10 mg total) by mouth every 6 (six) hours as needed (Nausea or vomiting). 09/26/18   Heath Lark, MD     Family History  Problem Relation Age of Onset  . Alzheimer's disease Mother   . Colon cancer Father 73  . Heart Problems Father   . Diabetes Maternal Aunt     Social History   Socioeconomic History  . Marital status: Widowed    Spouse name: Not on file  . Number of children: 2  . Years of education: Not on file  . Highest education level: Not on file  Occupational History  . Occupation: retired Dance movement psychotherapist  Social Needs  . Financial resource strain: Not on file  . Food insecurity:    Worry: Not  on file    Inability: Not on file  . Transportation needs:    Medical: Not on file    Non-medical: Not on file  Tobacco Use  . Smoking status: Never Smoker  . Smokeless tobacco: Never Used  Substance and Sexual Activity  . Alcohol use: Yes    Comment: holidays  . Drug use: No  . Sexual activity: Not on file  Lifestyle  . Physical activity:    Days per week: Not on file    Minutes per session: Not on file  . Stress: Not on file  Relationships  . Social connections:    Talks on phone: Not on file    Gets together: Not on file    Attends religious service: Not on file     Active member of club or organization: Not on file    Attends meetings of clubs or organizations: Not on file    Relationship status: Not on file  Other Topics Concern  . Not on file  Social History Narrative  . Not on file    Review of Systems: A 12 point ROS discussed and pertinent positives are indicated in the HPI above.  All other systems are negative.  Review of Systems  Constitutional: Negative for activity change and fever.  Respiratory: Negative for cough and shortness of breath.   Cardiovascular: Negative for chest pain.  Gastrointestinal: Negative for abdominal pain.  Psychiatric/Behavioral: Negative for behavioral problems and confusion.    Vital Signs: BP (!) 147/83   Pulse 72   Temp 97.7 F (36.5 C)   Resp 18   Ht 5\' 3"  (1.6 m)   Wt (!) 305 lb (138.3 kg)   SpO2 98%   BMI 54.03 kg/m   Physical Exam Vitals signs reviewed.  Cardiovascular:     Rate and Rhythm: Normal rate and regular rhythm.  Pulmonary:     Effort: Pulmonary effort is normal.     Breath sounds: Normal breath sounds.  Abdominal:     General: Bowel sounds are normal.     Palpations: Abdomen is soft.  Musculoskeletal: Normal range of motion.  Skin:    General: Skin is warm and dry.  Neurological:     General: No focal deficit present.     Mental Status: She is oriented to person, place, and time.  Psychiatric:        Mood and Affect: Mood normal.        Behavior: Behavior normal.        Thought Content: Thought content normal.        Judgment: Judgment normal.     Imaging: Ct Abdomen Pelvis W Contrast  Result Date: 09/22/2018 CLINICAL DATA:  Newly diagnosed endometrial carcinoma.  Staging. Creatinine was obtained on site at [facility] at [address] Results: Creatinine 0.7 mg/dL. EXAM: CT ABDOMEN AND PELVIS WITH CONTRAST TECHNIQUE: Multidetector CT imaging of the abdomen and pelvis was performed using the standard protocol following bolus administration of intravenous  contrast. CONTRAST:  146mL ISOVUE-300 IOPAMIDOL (ISOVUE-300) INJECTION 61% COMPARISON:  None. FINDINGS: Lower Chest: No acute findings. Hepatobiliary: No hepatic masses identified. Two small cysts are seen in the left hepatic lobe. Moderate diffuse hepatic steatosis noted. Prior cholecystectomy. No evidence of biliary obstruction. Pancreas:  No mass or inflammatory changes. Spleen: Within normal limits in size and appearance. Adrenals/Urinary Tract: No masses identified. No evidence of hydronephrosis. Unremarkable unopacified urinary bladder. Stomach/Bowel: No evidence of obstruction, inflammatory process or abnormal fluid collections. Mild diverticulosis is  seen involving the descending and sigmoid colon, without evidence of diverticulitis. Vascular/Lymphatic: Pelvic lymphadenopathy is seen in the left common and external iliac chains, with largest lymph node in the left common iliac region measuring 2.5 cm on image 57/2. Mild right external iliac lymphadenopathy is seen with largest lymph node measuring 1.8 cm on image 62/2. Retroperitoneal lymphadenopathy is seen throughout the aortocaval and left paraaortic spaces, with largest lymph node measuring 1.7 cm on image 39/2. A 10 mm right retrocaval lymph node is also seen. No abdominal aortic aneurysm. Reproductive: Diffuse endometrial thickening is seen measuring approximately 25 mm, consistent with known endometrial carcinoma. No evidence of extra uterine extension. Adnexal regions are unremarkable. Other:  None. Musculoskeletal:  No suspicious bone lesions identified. IMPRESSION: Diffuse endometrial thickening, consistent with primary endometrial carcinoma. Pelvic and abdominal retroperitoneal and retrocrural lymphadenopathy, consistent with metastatic disease. Moderate hepatic steatosis. Electronically Signed   By: Earle Gell M.D.   On: 09/22/2018 08:48   US Breast Ltd Uni Right Inc Axilla  Result Date: 09/22/2018 CLINICAL DATA:  Screening recall for  possible right breast mass. EXAM: DIGITAL DIAGNOSTIC UNILATERAL RIGHT MAMMOGRAM WITH CAD AND TOMO RIGHT BREAST ULTRASOUND COMPARISON:  Previous exam(s). ACR Breast Density Category b: There are scattered areas of fibroglandular density. FINDINGS: Additional tomograms were performed of the right breast. The initially questioned possible right breast mass is less apparent appears low density with possible internal fat suggestive of a benign intramammary lymph node. Mammographic images were processed with CAD. Targeted ultrasound of the entire outer right breast was performed. There is an intramammary lymph node in the right at 9 o'clock 4 cm from nipple measuring 0.7 x 0.3 x 0.6 cm. This is felt to correspond well with the mass seen in the right breast at mammography. No suspicious abnormality seen in the outer right breast. IMPRESSION: No findings of malignancy in the right breast. RECOMMENDATION: Recommend annual routine screening mammography, due November 2020. I have discussed the findings and recommendations with the patient. Results were also provided in writing at the conclusion of the visit. If applicable, a reminder letter will be sent to the patient regarding the next appointment. BI-RADS CATEGORY  2: Benign. Electronically Signed   By: Everlean Alstrom M.D.   On: 09/22/2018 10:11   Mm Diag Breast Tomo Uni Right  Result Date: 09/22/2018 CLINICAL DATA:  Screening recall for possible right breast mass. EXAM: DIGITAL DIAGNOSTIC UNILATERAL RIGHT MAMMOGRAM WITH CAD AND TOMO RIGHT BREAST ULTRASOUND COMPARISON:  Previous exam(s). ACR Breast Density Category b: There are scattered areas of fibroglandular density. FINDINGS: Additional tomograms were performed of the right breast. The initially questioned possible right breast mass is less apparent appears low density with possible internal fat suggestive of a benign intramammary lymph node. Mammographic images were processed with CAD. Targeted ultrasound of the  entire outer right breast was performed. There is an intramammary lymph node in the right at 9 o'clock 4 cm from nipple measuring 0.7 x 0.3 x 0.6 cm. This is felt to correspond well with the mass seen in the right breast at mammography. No suspicious abnormality seen in the outer right breast. IMPRESSION: No findings of malignancy in the right breast. RECOMMENDATION: Recommend annual routine screening mammography, due November 2020. I have discussed the findings and recommendations with the patient. Results were also provided in writing at the conclusion of the visit. If applicable, a reminder letter will be sent to the patient regarding the next appointment. BI-RADS CATEGORY  2: Benign. Electronically Signed  By: Everlean Alstrom M.D.   On: 09/22/2018 10:11    Labs:  CBC: Recent Labs    10/01/18 0726  WBC 6.6  HGB 14.6  HCT 44.7  PLT 276    COAGS: Recent Labs    10/01/18 0726  INR 0.98    BMP: Recent Labs    09/24/18 1303 10/01/18 0726  NA 141 138  K 3.9 3.8  CL 103 99  CO2 27 26  GLUCOSE 103* 122*  BUN 14 11  CALCIUM 9.6 9.1  CREATININE 0.82 0.79  GFRNONAA >60 >60  GFRAA >60 >60    LIVER FUNCTION TESTS: Recent Labs    09/24/18 1303  BILITOT 0.5  AST 20  ALT 17  ALKPHOS 121  PROT 8.3*  ALBUMIN 4.2    TUMOR MARKERS: No results for input(s): AFPTM, CEA, CA199, CHROMGRNA in the last 8760 hours.  Assessment and Plan:  Port a cath- to start Chemo Fri Newly dx Uterine Ca Risks and benefits of image guided port-a-catheter placement was discussed with the patient including, but not limited to bleeding, infection, pneumothorax, or fibrin sheath development and need for additional procedures.  All of the patient's questions were answered, patient is agreeable to proceed. Consent signed and in chart.    Thank you for this interesting consult.  I greatly enjoyed [place] and look forward to participating in their care.  A copy of this report was  sent to the requesting provider on this date.  Electronically Signed: Lavonia Drafts, PA-C 10/01/2018, 8:41 AM   I spent a total of  30 Minutes   in face to face in clinical consultation, greater than 50% of which was counseling/coordinating care for [facility] placement

## 2018-10-01 NOTE — Discharge Instructions (Addendum)
Implanted Port Insertion, Care After °This sheet gives you information about how to care for yourself after your procedure. Your health care provider may also give you more specific instructions. If you have problems or questions, contact your health care provider. °What can I expect after the procedure? °After the procedure, it is common to have: °· Discomfort at the port insertion site. °· Bruising on the skin over the port. This should improve over 3-4 days. °Follow these instructions at home: °Port care °· After your port is placed, you will get a manufacturer's information card. The card has information about your port. Keep this card with you at all times. °· Take care of the port as told by your health care provider. Ask your health care provider if you or a family member can get training for taking care of the port at home. A home health care nurse may also take care of the port. °· Make sure to remember what type of port you have. °Incision care ° °  ° °· Follow instructions from your health care provider about how to take care of your port insertion site. Make sure you: °? Wash your hands with soap and water before and after you change your bandage (dressing). If soap and water are not available, use hand sanitizer. °? Change your dressing as told by your health care provider. °? Leave stitches (sutures), skin glue, or adhesive strips in place. These skin closures may need to stay in place for 2 weeks or longer. If adhesive strip edges start to loosen and curl up, you may trim the loose edges. Do not remove adhesive strips completely unless your health care provider tells you to do that. °· Check your port insertion site every day for signs of infection. Check for: °? Redness, swelling, or pain. °? Fluid or blood. °? Warmth. °? Pus or a bad smell. °Activity °· Return to your normal activities as told by your health care provider. Ask your health care provider what activities are safe for you. °· Do not  lift anything that is heavier than 10 lb (4.5 kg), or the limit that you are told, until your health care provider says that it is safe. °General instructions °· Take over-the-counter and prescription medicines only as told by your health care provider. °· Do not take baths, swim, or use a hot tub until your health care provider approves. Ask your health care provider if you may take showers. You may only be allowed to take sponge baths. °· Do not drive for 24 hours if you were given a sedative during your procedure. °· Wear a medical alert bracelet in case of an emergency. This will tell any health care providers that you have a port. °· Keep all follow-up visits as told by your health care provider. This is important. °Contact a health care provider if: °· You cannot flush your port with saline as directed, or you cannot draw blood from the port. °· You have a fever or chills. °· You have redness, swelling, or pain around your port insertion site. °· You have fluid or blood coming from your port insertion site. °· Your port insertion site feels warm to the touch. °· You have pus or a bad smell coming from the port insertion site. °Get help right away if: °· You have chest pain or shortness of breath. °· You have bleeding from your port that you cannot control. °Summary °· Take care of the port as told by your health   care provider. Keep the manufacturer's information card with you at all times. °· Change your dressing as told by your health care provider. °· Contact a health care provider if you have a fever or chills or if you have redness, swelling, or pain around your port insertion site. °· Keep all follow-up visits as told by your health care provider. °This information is not intended to replace advice given to you by your health care provider. Make sure you discuss any questions you have with your health care provider. °Document Released: 07/22/2013 Document Revised: 04/29/2018 Document Reviewed:  04/29/2018 °Elsevier Interactive Patient Education © 2019 [employer] ° °Moderate Conscious Sedation, Adult, Care After °These instructions provide you with information about caring for yourself after your procedure. Your health care provider may also give you more specific instructions. Your treatment has been planned according to current medical practices, but problems sometimes occur. Call your health care provider if you have any problems or questions after your procedure. °What can I expect after the procedure? °After your procedure, it is common: °· To feel sleepy for several hours. °· To feel clumsy and have poor balance for several hours. °· To have poor judgment for several hours. °· To vomit if you eat too soon. °Follow these instructions at home: °For at least 24 hours after the procedure: ° °· Do not: °? Participate in activities where you could fall or become injured. °? Drive. °? Use heavy machinery. °? Drink alcohol. °? Take sleeping pills or medicines that cause drowsiness. °? Make important decisions or sign legal documents. °? Take care of children on your own. °· Rest. °Eating and drinking °· Follow the diet recommended by your health care provider. °· If you vomit: °? Drink water, juice, or soup when you can drink without vomiting. °? Make sure you have little or no nausea before eating solid foods. °General instructions °· Have a responsible adult stay with you until you are awake and alert. °· Take over-the-counter and prescription medicines only as told by your health care provider. °· If you smoke, do not smoke without supervision. °· Keep all follow-up visits as told by your health care provider. This is important. °Contact a health care provider if: °· You keep feeling nauseous or you keep vomiting. °· You feel light-headed. °· You develop a rash. °· You have a fever. °Get help right away if: °· You have trouble breathing. °This information is not intended to replace advice given to you  by your health care provider. Make sure you discuss any questions you have with your health care provider. °Document Released: 07/22/2013 Document Revised: 03/05/2016 Document Reviewed: 01/21/2016 °Elsevier Interactive Patient Education © 2019 [employer] Inc. ° °

## 2018-10-02 ENCOUNTER — Ambulatory Visit (HOSPITAL_COMMUNITY)
Admission: RE | Admit: 2018-10-02 | Discharge: 2018-10-02 | Disposition: A | Discharge status: Home / Self Care | Payer: Medicare Other | Source: Ambulatory Visit | Attending: Obstetrics | Admitting: Obstetrics

## 2018-10-02 ENCOUNTER — Inpatient Hospital Stay: Payer: Medicare Other

## 2018-10-02 DIAGNOSIS — R591 Generalized enlarged lymph nodes: Secondary | ICD-10-CM | POA: Diagnosis not present

## 2018-10-02 DIAGNOSIS — D4959 Neoplasm of unspecified behavior of other genitourinary organ: Secondary | ICD-10-CM | POA: Diagnosis present

## 2018-10-02 DIAGNOSIS — C541 Malignant neoplasm of endometrium: Secondary | ICD-10-CM | POA: Diagnosis not present

## 2018-10-02 LAB — CBC WITH DIFFERENTIAL (CANCER CENTER ONLY)
ABS IMMATURE GRANULOCYTES: 0.02 10*3/uL (ref 0.00–0.07)
Basophils Absolute: 0 10*3/uL (ref 0.0–0.1)
Basophils Relative: 0 %
Eosinophils Absolute: 0.1 10*3/uL (ref 0.0–0.5)
Eosinophils Relative: 1 %
HCT: 43.1 % (ref 36.0–46.0)
HEMOGLOBIN: 14.3 g/dL (ref 12.0–15.0)
Immature Granulocytes: 0 %
LYMPHS PCT: 35 %
Lymphs Abs: 2.6 10*3/uL (ref 0.7–4.0)
MCH: 29.7 pg (ref 26.0–34.0)
MCHC: 33.2 g/dL (ref 30.0–36.0)
MCV: 89.4 fL (ref 80.0–100.0)
Monocytes Absolute: 0.8 10*3/uL (ref 0.1–1.0)
Monocytes Relative: 10 %
Neutro Abs: 4.1 10*3/uL (ref 1.7–7.7)
Neutrophils Relative %: 54 %
Platelet Count: 260 10*3/uL (ref 150–400)
RBC: 4.82 MIL/uL (ref 3.87–5.11)
RDW: 14.3 % (ref 11.5–15.5)
WBC Count: 7.6 10*3/uL (ref 4.0–10.5)
nRBC: 0 % (ref 0.0–0.2)

## 2018-10-02 LAB — CMP (CANCER CENTER ONLY)
ALT: 19 U/L (ref 0–44)
AST: 31 U/L (ref 15–41)
Albumin: 4.1 g/dL (ref 3.5–5.0)
Alkaline Phosphatase: 116 U/L (ref 38–126)
Anion gap: 12 (ref 5–15)
BUN: 17 mg/dL (ref 8–23)
CO2: 25 mmol/L (ref 22–32)
Calcium: 9.4 mg/dL (ref 8.9–10.3)
Chloride: 101 mmol/L (ref 98–111)
Creatinine: 0.79 mg/dL (ref 0.44–1.00)
GFR, Est AFR Am: >60 mL/min (ref >60)
GFR, Estimated: >60 mL/min (ref >60)
Glucose, Bld: 108 mg/dL — ABNORMAL HIGH (ref 70–99)
Potassium: 3.6 mmol/L (ref 3.5–5.1)
SODIUM: 138 mmol/L (ref 135–145)
Total Bilirubin: 0.4 mg/dL (ref 0.3–1.2)
Total Protein: 8.1 g/dL (ref 6.5–8.1)

## 2018-10-02 MED ORDER — SODIUM CHLORIDE (PF) 0.9 % IJ SOLN
INTRAMUSCULAR | Status: AC
  Filled 2018-10-02: qty 50

## 2018-10-02 MED ORDER — IOHEXOL 300 MG/ML  SOLN
75.0000 mL | Freq: Once | INTRAMUSCULAR | Status: AC | PRN
  Administered 2018-10-02: 75 mL via INTRAVENOUS

## 2018-10-03 ENCOUNTER — Telehealth: Payer: Self-pay | Admitting: Hematology and Oncology

## 2018-10-03 ENCOUNTER — Inpatient Hospital Stay: Payer: Medicare Other

## 2018-10-03 ENCOUNTER — Encounter

## 2018-10-03 ENCOUNTER — Inpatient Hospital Stay (HOSPITAL_BASED_OUTPATIENT_CLINIC_OR_DEPARTMENT_OTHER): Payer: Medicare Other | Admitting: Hematology and Oncology

## 2018-10-03 ENCOUNTER — Encounter: Payer: Self-pay | Admitting: Hematology and Oncology

## 2018-10-03 ENCOUNTER — Other Ambulatory Visit: Payer: Self-pay | Admitting: Hematology and Oncology

## 2018-10-03 VITALS — BP 153/77 | HR 66 | Temp 97.8°F | Resp 17

## 2018-10-03 DIAGNOSIS — G63 Polyneuropathy in diseases classified elsewhere: Secondary | ICD-10-CM

## 2018-10-03 DIAGNOSIS — R5381 Other malaise: Secondary | ICD-10-CM

## 2018-10-03 DIAGNOSIS — C541 Malignant neoplasm of endometrium: Secondary | ICD-10-CM

## 2018-10-03 DIAGNOSIS — R591 Generalized enlarged lymph nodes: Secondary | ICD-10-CM | POA: Diagnosis not present

## 2018-10-03 DIAGNOSIS — Z79899 Other long term (current) drug therapy: Secondary | ICD-10-CM

## 2018-10-03 LAB — CA 125: Cancer Antigen (CA) 125: 578 U/mL — ABNORMAL HIGH (ref 0.0–38.1)

## 2018-10-03 MED ORDER — DIPHENHYDRAMINE HCL 50 MG/ML IJ SOLN: 50.0000 mg | Freq: Once | INTRAMUSCULAR | Status: DC

## 2018-10-03 MED ORDER — HEPARIN SOD (PORK) LOCK FLUSH 100 UNIT/ML IV SOLN
500.0000 [IU] | Freq: Once | INTRAVENOUS | Status: AC | PRN
  Administered 2018-10-03: 500 [IU]
  Filled 2018-10-03: qty 5

## 2018-10-03 MED ORDER — SODIUM CHLORIDE 0.9 % IV SOLN
Freq: Once | INTRAVENOUS | Status: AC
  Administered 2018-10-03: 10:00:00 via INTRAVENOUS
  Filled 2018-10-03: qty 5

## 2018-10-03 MED ORDER — SODIUM CHLORIDE 0.9 % IV SOLN
131.2500 mg/m2 | Freq: Once | INTRAVENOUS | Status: AC
  Administered 2018-10-03: 324 mg via INTRAVENOUS
  Filled 2018-10-03: qty 54

## 2018-10-03 MED ORDER — SODIUM CHLORIDE 0.9% FLUSH
10.0000 mL | INTRAVENOUS | Status: DC | PRN
  Administered 2018-10-03: 10 mL
  Filled 2018-10-03: qty 10

## 2018-10-03 MED ORDER — PALONOSETRON HCL INJECTION 0.25 MG/5ML
0.2500 mg | Freq: Once | INTRAVENOUS | Status: AC
  Administered 2018-10-03: 0.25 mg via INTRAVENOUS

## 2018-10-03 MED ORDER — SODIUM CHLORIDE 0.9 % IV SOLN
Freq: Once | INTRAVENOUS | Status: AC
  Administered 2018-10-03: 09:00:00 via INTRAVENOUS
  Filled 2018-10-03: qty 250

## 2018-10-03 MED ORDER — FAMOTIDINE IN NACL 20-0.9 MG/50ML-% IV SOLN
20.0000 mg | Freq: Once | INTRAVENOUS | Status: AC
  Administered 2018-10-03: 20 mg via INTRAVENOUS

## 2018-10-03 MED ORDER — PALONOSETRON HCL INJECTION 0.25 MG/5ML
INTRAVENOUS | Status: AC
  Filled 2018-10-03: qty 5

## 2018-10-03 MED ORDER — SODIUM CHLORIDE 0.9 % IV SOLN
750.0000 mg | Freq: Once | INTRAVENOUS | Status: AC
  Administered 2018-10-03: 750 mg via INTRAVENOUS
  Filled 2018-10-03: qty 75

## 2018-10-03 MED ORDER — DIPHENHYDRAMINE HCL 50 MG/ML IJ SOLN
INTRAMUSCULAR | Status: AC
  Filled 2018-10-03: qty 1

## 2018-10-03 MED ORDER — FAMOTIDINE IN NACL 20-0.9 MG/50ML-% IV SOLN
INTRAVENOUS | Status: AC
  Filled 2018-10-03: qty 50

## 2018-10-03 NOTE — Progress Notes (Signed)
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  Pt has tried taking a smaller dose but hives persist. Pt stated she took Benadryl 100 mg at 0600 today. Made MD aware and MD will remove Benadryl from premeds. Kennith Center, Pharm.D., CPP 10/03/2018@10 :21 AM

## 2018-10-03 NOTE — Assessment & Plan Note (Signed)
I have reviewed the CT imaging with the patient and her daughter. We reviewed the NCCN guidelines We discussed the role of chemotherapy. The intent is of curative intent, currently given in a neoadjuvant fashion  We discussed some of the risks, benefits, side-effects of carboplatin & Taxol. Treatment is intravenous, every 3 weeks x 6 cycles  Some of the short term side-effects included, though not limited to, including weight loss, life threatening infections, risk of allergic reactions, need for transfusions of blood products, nausea, vomiting, change in bowel habits, loss of hair, admission to hospital for various reasons, and risks of death.   Long term side-effects are also discussed including risks of infertility, permanent damage to nerve function, hearing loss, chronic fatigue, kidney damage with possibility needing hemodialysis, and rare secondary malignancy including bone marrow disorders.  The patient is aware that the response rates discussed earlier is not guaranteed.  After a long discussion, patient made an informed decision to proceed with the prescribed plan of care.   Patient education material was dispensed. We discussed premedication with dexamethasone before chemotherapy. Due to her young age, I will not prescribe prophylactic G-CSF support Due to her morbid obesity, I will keep carboplatin at maximum dose of 750 mg and to reduce paclitaxel by approximately 25% due to pre-existing peripheral neuropathy I recommend 3 cycles of chemotherapy before repeat imaging study We will follow tumor marker closely while on treatment

## 2018-10-03 NOTE — Patient Instructions (Signed)
[facility] Discharge Instructions for Patients Receiving Chemotherapy  Today you received the following chemotherapy agents Taxol and Carboplatin  To help prevent nausea and vomiting after your treatment, we encourage you to take your nausea medication as prescribed by MD. **DO NOT TAKE ZOFRAN FOR 3 DAYS AFTER TREATMENT**   If you develop nausea and vomiting that is not controlled by your nausea medication, call the clinic.   BELOW ARE SYMPTOMS THAT SHOULD BE REPORTED IMMEDIATELY:  *FEVER GREATER THAN 100.5 F  *CHILLS WITH OR WITHOUT FEVER  NAUSEA AND VOMITING THAT IS NOT CONTROLLED WITH YOUR NAUSEA MEDICATION  *UNUSUAL SHORTNESS OF BREATH  *UNUSUAL BRUISING OR BLEEDING  TENDERNESS IN MOUTH AND THROAT WITH OR WITHOUT PRESENCE OF ULCERS  *URINARY PROBLEMS  *BOWEL PROBLEMS  UNUSUAL RASH Items with * indicate a potential emergency and should be followed up as soon as possible.  Feel free to call the clinic should you have any questions or concerns. The clinic phone number is [phone].  Please show the [facility] at check-in to the Emergency Department and triage nurse.  Paclitaxel injection What is this medicine? PACLITAXEL (PAK li TAX el) is a chemotherapy drug. It targets fast dividing cells, like cancer cells, and causes these cells to die. This medicine is used to treat ovarian cancer, breast cancer, lung cancer, Kaposi's sarcoma, and other cancers. This medicine may be used for other purposes; ask your health care provider or pharmacist if you have questions. COMMON BRAND NAME(S): Onxol, Taxol What should I tell my health care provider before I take this medicine? They need to know if you have any of these conditions: -history of irregular heartbeat -liver disease -low blood counts, like low white cell, platelet, or red cell counts -lung or breathing disease, like asthma -tingling of the fingers or toes, or other nerve disorder -an unusual  or allergic reaction to paclitaxel, alcohol, polyoxyethylated castor oil, other chemotherapy, other medicines, foods, dyes, or preservatives -pregnant or trying to get pregnant -breast-feeding How should I use this medicine? This drug is given as an infusion into a vein. It is administered in a hospital or clinic by a specially trained health care professional. Talk to your pediatrician regarding the use of this medicine in children. Special care may be needed. Overdosage: If you think you have taken too much of this medicine contact a poison control center or emergency room at once. NOTE: This medicine is only for you. Do not share this medicine with others. What if I miss a dose? It is important not to miss your dose. Call your doctor or health care professional if you are unable to keep an appointment. What may interact with this medicine? Do not take this medicine with any of the following medications: -disulfiram -metronidazole This medicine may also interact with the following medications: -antiviral medicines for hepatitis, HIV or AIDS -certain antibiotics like erythromycin and clarithromycin -certain medicines for fungal infections like ketoconazole and itraconazole -certain medicines for seizures like carbamazepine, phenobarbital, phenytoin -gemfibrozil -nefazodone -rifampin -St. John's wort This list may not describe all possible interactions. Give your health care provider a list of all the medicines, herbs, non-prescription drugs, or dietary supplements you use. Also tell them if you smoke, drink alcohol, or use illegal drugs. Some items may interact with your medicine. What should I watch for while using this medicine? Your condition will be monitored carefully while you are receiving this medicine. You will need important blood work done while you are taking  this medicine. This medicine can cause serious allergic reactions. To reduce your risk you will need to take other  medicine(s) before treatment with this medicine. If you experience allergic reactions like skin rash, itching or hives, swelling of the face, lips, or tongue, tell your doctor or health care professional right away. In some cases, you may be given additional medicines to help with side effects. Follow all directions for their use. This drug may make you feel generally unwell. This is not uncommon, as chemotherapy can affect healthy cells as well as cancer cells. Report any side effects. Continue your course of treatment even though you feel ill unless your doctor tells you to stop. Call your doctor or health care professional for advice if you get a fever, chills or sore throat, or other symptoms of a cold or flu. Do not treat yourself. This drug decreases your body's ability to fight infections. Try to avoid being around people who are sick. This medicine may increase your risk to bruise or bleed. Call your doctor or health care professional if you notice any unusual bleeding. Be careful brushing and flossing your teeth or using a toothpick because you may get an infection or bleed more easily. If you have any dental work done, tell your dentist you are receiving this medicine. Avoid taking products that contain aspirin, acetaminophen, ibuprofen, naproxen, or ketoprofen unless instructed by your doctor. These medicines may hide a fever. Do not become pregnant while taking this medicine. Women should inform their doctor if they wish to become pregnant or think they might be pregnant. There is a potential for serious side effects to an unborn child. Talk to your health care professional or pharmacist for more information. Do not breast-feed an infant while taking this medicine. Men are advised not to father a child while receiving this medicine. This product may contain alcohol. Ask your pharmacist or healthcare provider if this medicine contains alcohol. Be sure to tell all healthcare providers you are  taking this medicine. Certain medicines, like metronidazole and disulfiram, can cause an unpleasant reaction when taken with alcohol. The reaction includes flushing, headache, nausea, vomiting, sweating, and increased thirst. The reaction can last from 30 minutes to several hours. What side effects may I notice from receiving this medicine? Side effects that you should report to your doctor or health care professional as soon as possible: -allergic reactions like skin rash, itching or hives, swelling of the face, lips, or tongue -breathing problems -changes in vision -fast, irregular heartbeat -high or low blood pressure -mouth sores -pain, tingling, numbness in the hands or feet -signs of decreased platelets or bleeding - bruising, pinpoint red spots on the skin, black, tarry stools, blood in the urine -signs of decreased red blood cells - unusually weak or tired, feeling faint or lightheaded, falls -signs of infection - fever or chills, cough, sore throat, pain or difficulty passing urine -signs and symptoms of liver injury like dark yellow or brown urine; general ill feeling or flu-like symptoms; light-colored stools; loss of appetite; nausea; right upper belly pain; unusually weak or tired; yellowing of the eyes or skin -swelling of the ankles, feet, hands -unusually slow heartbeat Side effects that usually do not require medical attention (report to your doctor or health care professional if they continue or are bothersome): -diarrhea -hair loss -loss of appetite -muscle or joint pain -nausea, vomiting -pain, redness, or irritation at site where injected -tiredness This list may not describe all possible side effects. Call your doctor  for medical advice about side effects. You may report side effects to FDA at 1-800-FDA-1088. Where should I keep my medicine? This drug is given in a hospital or clinic and will not be stored at home. NOTE: This sheet is a summary. It may not cover all  possible information. If you have questions about this medicine, talk to your doctor, pharmacist, or health care provider.  2019 [employer] Standard (2017-06-04 13:14:55)  Carboplatin injection What is this medicine? CARBOPLATIN (KAR boe pla tin) is a chemotherapy drug. It targets fast dividing cells, like cancer cells, and causes these cells to die. This medicine is used to treat ovarian cancer and many other cancers. This medicine may be used for other purposes; ask your health care provider or pharmacist if you have questions. COMMON BRAND NAME(S): Paraplatin What should I tell my health care provider before I take this medicine? They need to know if you have any of these conditions: -blood disorders -hearing problems -kidney disease -recent or ongoing radiation therapy -an unusual or allergic reaction to carboplatin, cisplatin, other chemotherapy, other medicines, foods, dyes, or preservatives -pregnant or trying to get pregnant -breast-feeding How should I use this medicine? This drug is usually given as an infusion into a vein. It is administered in a hospital or clinic by a specially trained health care professional. Talk to your pediatrician regarding the use of this medicine in children. Special care may be needed. Overdosage: If you think you have taken too much of this medicine contact a poison control center or emergency room at once. NOTE: This medicine is only for you. Do not share this medicine with others. What if I miss a dose? It is important not to miss a dose. Call your doctor or health care professional if you are unable to keep an appointment. What may interact with this medicine? -medicines for seizures -medicines to increase blood counts like filgrastim, pegfilgrastim, sargramostim -some antibiotics like amikacin, gentamicin, neomycin, streptomycin, tobramycin -vaccines Talk to your doctor or health care professional before taking any of these  medicines: -acetaminophen -aspirin -ibuprofen -ketoprofen -naproxen This list may not describe all possible interactions. Give your health care provider a list of all the medicines, herbs, non-prescription drugs, or dietary supplements you use. Also tell them if you smoke, drink alcohol, or use illegal drugs. Some items may interact with your medicine. What should I watch for while using this medicine? Your condition will be monitored carefully while you are receiving this medicine. You will need important blood work done while you are taking this medicine. This drug may make you feel generally unwell. This is not uncommon, as chemotherapy can affect healthy cells as well as cancer cells. Report any side effects. Continue your course of treatment even though you feel ill unless your doctor tells you to stop. In some cases, you may be given additional medicines to help with side effects. Follow all directions for their use. Call your doctor or health care professional for advice if you get a fever, chills or sore throat, or other symptoms of a cold or flu. Do not treat yourself. This drug decreases your body's ability to fight infections. Try to avoid being around people who are sick. This medicine may increase your risk to bruise or bleed. Call your doctor or health care professional if you notice any unusual bleeding. Be careful brushing and flossing your teeth or using a toothpick because you may get an infection or bleed more easily. If you have any dental work done,  tell your dentist you are receiving this medicine. Avoid taking products that contain aspirin, acetaminophen, ibuprofen, naproxen, or ketoprofen unless instructed by your doctor. These medicines may hide a fever. Do not become pregnant while taking this medicine. Women should inform their doctor if they wish to become pregnant or think they might be pregnant. There is a potential for serious side effects to an unborn child. Talk to  your health care professional or pharmacist for more information. Do not breast-feed an infant while taking this medicine. What side effects may I notice from receiving this medicine? Side effects that you should report to your doctor or health care professional as soon as possible: -allergic reactions like skin rash, itching or hives, swelling of the face, lips, or tongue -signs of infection - fever or chills, cough, sore throat, pain or difficulty passing urine -signs of decreased platelets or bleeding - bruising, pinpoint red spots on the skin, black, tarry stools, nosebleeds -signs of decreased red blood cells - unusually weak or tired, fainting spells, lightheadedness -breathing problems -changes in hearing -changes in vision -chest pain -high blood pressure -low blood counts - This drug may decrease the number of white blood cells, red blood cells and platelets. You may be at increased risk for infections and bleeding. -nausea and vomiting -pain, swelling, redness or irritation at the injection site -pain, tingling, numbness in the hands or feet -problems with balance, talking, walking -trouble passing urine or change in the amount of urine Side effects that usually do not require medical attention (report to your doctor or health care professional if they continue or are bothersome): -hair loss -loss of appetite -metallic taste in the mouth or changes in taste This list may not describe all possible side effects. Call your doctor for medical advice about side effects. You may report side effects to FDA at 1-800-FDA-1088. Where should I keep my medicine? This drug is given in a hospital or clinic and will not be stored at home. NOTE: This sheet is a summary. It may not cover all possible information. If you have questions about this medicine, talk to your doctor, pharmacist, or health care provider.  2019 [employer]vier/Gold Standard (2008-01-06 14:38:05)

## 2018-10-03 NOTE — Telephone Encounter (Signed)
Gave avs and calendar ° °

## 2018-10-03 NOTE — Progress Notes (Signed)
Went to infusion to introduce myself as Arboriculturist and to offer available resources.  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 She verbalized understanding.  Gave my card for any additional financial questions or concerns.

## 2018-10-03 NOTE — Assessment & Plan Note (Signed)
She is scheduled for physical therapy and evaluation in the near future

## 2018-10-03 NOTE — Assessment & Plan Note (Signed)
As above, I plan modest dose adjustment for her She will continue chronic pain management through pain clinic

## 2018-10-03 NOTE — Progress Notes (Signed)
[place] OFFICE PROGRESS NOTE  Patient Care Team: Ronita Hipps, MD as PCP - General (Family Medicine) Nicholaus Bloom, MD (Anesthesiology)  ASSESSMENT & PLAN:  Endometrial cancer [facility]) I have reviewed the CT imaging with the patient and her daughter. We reviewed the NCCN guidelines We discussed the role of chemotherapy. The intent is of curative intent, currently given in a neoadjuvant fashion  We discussed some of the risks, benefits, side-effects of carboplatin & Taxol. Treatment is intravenous, every 3 weeks x 6 cycles  Some of the short term side-effects included, though not limited to, including weight loss, life threatening infections, risk of allergic reactions, need for transfusions of blood products, nausea, vomiting, change in bowel habits, loss of hair, admission to hospital for various reasons, and risks of death.   Long term side-effects are also discussed including risks of infertility, permanent damage to nerve function, hearing loss, chronic fatigue, kidney damage with possibility needing hemodialysis, and rare secondary malignancy including bone marrow disorders.  The patient is aware that the response rates discussed earlier is not guaranteed.  After a long discussion, patient made an informed decision to proceed with the prescribed plan of care.   Patient education material was dispensed. We discussed premedication with dexamethasone before chemotherapy. 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   INTERVAL HISTORY: Please see below for problem oriented charting. She returns with her daughter for cycle 1 of chemotherapy She tolerated port placement well She continues to have chronic back pain. Denies abdominal bloating  SUMMARY OF ONCOLOGIC HISTORY:   Endometrial cancer ([place])   07/15/2018 Initial Diagnosis    She noted postmenopausal bleeding for the first time in October 2019. A Pap was done 09/02/18 showign ASC-H. She was referred onto GYN and seen by Dr. Paula Compton     09/09/2018 Procedure    She underwent endometrial biopsy    09/12/2018 Pathology Results    Endometrial biopsy positive for adenocarcinoma    09/22/2018 Imaging    Ct abdomen and pelvis showed: Diffuse endometrial thickening, consistent with primary endometrial carcinoma.  Pelvic and abdominal retroperitoneal and retrocrural lymphadenopathy, consistent with metastatic disease.  Moderate hepatic steatosis.    10/01/2018 Procedure    Successful placement of a right IJ approach Power Port with ultrasound and fluoroscopic guidance. The catheter is ready for use.    10/02/2018 Tumor Marker    Patient's tumor was tested for the following markers: CA-125 Results of the tumor marker test revealed [id number]     REVIEW OF SYSTEMS:   Constitutional: Denies fevers, chills or abnormal weight loss Eyes: Denies blurriness of vision Ears, nose, mouth, throat, and face: Denies mucositis or sore throat Respiratory: Denies cough, dyspnea or wheezes Cardiovascular: Denies palpitation, chest discomfort or lower extremity swelling Gastrointestinal:  Denies nausea, heartburn or change in bowel habits Skin: Denies abnormal skin rashes Lymphatics: Denies new lymphadenopathy or  easy bruising Neurological:Denies numbness, tingling or new  weaknesses Behavioral/Psych: Mood is stable, no new changes  All other systems were reviewed with the patient and are negative.  I have reviewed the past medical history, past surgical history, social history and family history with the patient and they are unchanged from previous note.  ALLERGIES:  has No Known Allergies.  MEDICATIONS:  Current Outpatient Medications  Medication Sig Dispense Refill  . dexamethasone (DECADRON) 4 MG tablet Take 5 tabs at the night before and 5 tab the morning of chemotherapy, every 3 weeks, by mouth 60 tablet 0  . diphenhydrAMINE (SOMINEX) 25 MG tablet Take 100 mg by mouth 2 (two) times daily.     Marland Kitchen gabapentin (NEURONTIN) 400 MG capsule Take 800 mg by mouth 3 (three) times daily.   1  . hydrochlorothiazide (MICROZIDE) 12.5 MG capsule Take 12.5 mg by mouth daily.  0  . lidocaine-prilocaine (EMLA) cream Apply to affected area once 30 g 3  . loratadine (CLARITIN) 10 MG tablet Take 10 mg by mouth daily as needed for allergies.    Marland Kitchen losartan (COZAAR) 50 MG tablet Take 50 mg by mouth daily.  2  . ondansetron (ZOFRAN) 8 MG tablet Take 1 tablet (8 mg total) by mouth every 8 (eight) hours as needed for refractory nausea / vomiting. Start on day 3 after chemo. 30 tablet 1  . oxyCODONE-acetaminophen (PERCOCET) 10-325 MG per tablet Take 2 tablets by mouth every 8 (eight) hours as needed for pain.     Marland Kitchen prochlorperazine (COMPAZINE) 10 MG tablet Take 1 tablet (10 mg total) by mouth every 6 (six) hours as needed (Nausea or vomiting). 30 tablet 1   No current facility-administered medications for this visit.    Facility-Administered Medications Ordered in Other Visits  Medication Dose Route Frequency Provider Last Rate Last Dose  . 0.9 %  sodium chloride infusion   Intravenous Once Ziah Turvey, MD      . CARBOplatin (PARAPLATIN) 750 mg in sodium chloride 0.9 % 250 mL chemo infusion  750 mg Intravenous Once Alvy Bimler, Nevyn Bossman, MD      . diphenhydrAMINE (BENADRYL) injection 50 mg   50 mg Intravenous Once Alvy Bimler, Aeron Donaghey, MD      . famotidine (PEPCID) IVPB 20 mg premix  20 mg Intravenous Once Alvy Bimler, Karlye Ihrig, MD      . fosaprepitant (EMEND) 150 mg, dexamethasone (DECADRON) 12 mg in sodium chloride 0.9 % 145 mL IVPB   Intravenous Once Alvy Bimler, Skyelar Swigart, MD      . heparin lock flush 100 unit/mL  500 Units Intracatheter Once PRN Alvy Bimler, Leanard Dimaio, MD      . PACLitaxel (TAXOL) 324 mg in sodium chloride 0.9 % 500 mL chemo infusion (> 80mg /m2)  131.25 mg/m2 (Treatment Plan Recorded) Intravenous Once Alvy Bimler, Vian Fluegel, MD      . palonosetron (ALOXI) injection 0.25 mg  0.25 mg Intravenous Once Kenyetta Fife, MD      . sodium chloride flush (NS) 0.9 % injection 10 mL  10 mL Intracatheter PRN Alvy Bimler, Woodrow Dulski, MD        PHYSICAL EXAMINATION: ECOG PERFORMANCE STATUS: 2 - Symptomatic, <50% confined to bed  Vitals:   10/03/18 0810  BP: 139/75  Pulse: 83  Resp: 18  Temp: 98.1 F (36.7 C)  SpO2: 93%   Filed Weights   10/03/18 0810  Weight: (!) 305 lb 6.4 oz (138.5 kg)    GENERAL:alert, no distress and comfortable SKIN: Her port site looks okay NEURO: alert & oriented x 3  with fluent speech, no focal motor/sensory deficits  LABORATORY DATA:  I have reviewed the data as listed    Component Value Date/Time   NA 138 10/02/2018 1408   K 3.6 10/02/2018 1408   CL 101 10/02/2018 1408   CO2 25 10/02/2018 1408   GLUCOSE 108 (H) 10/02/2018 1408   BUN 17 10/02/2018 1408   CREATININE 0.79 10/02/2018 1408   CALCIUM 9.4 10/02/2018 1408   PROT 8.1 10/02/2018 1408   ALBUMIN 4.1 10/02/2018 1408   AST 31 10/02/2018 1408   ALT 19 10/02/2018 1408   ALKPHOS 116 10/02/2018 1408   BILITOT 0.4 10/02/2018 1408   GFRNONAA >60 10/02/2018 1408   GFRAA >60 10/02/2018 1408    No results found for: SPEP, UPEP  Lab Results  Component Value Date   WBC 7.6 10/02/2018   NEUTROABS 4.1 10/02/2018   HGB 14.3 10/02/2018   HCT 43.1 10/02/2018   MCV 89.4 10/02/2018   PLT 260 10/02/2018      Chemistry      Component  Value Date/Time   NA 138 10/02/2018 1408   K 3.6 10/02/2018 1408   CL 101 10/02/2018 1408   CO2 25 10/02/2018 1408   BUN 17 10/02/2018 1408   CREATININE 0.79 10/02/2018 1408      Component Value Date/Time   CALCIUM 9.4 10/02/2018 1408   ALKPHOS 116 10/02/2018 1408   AST 31 10/02/2018 1408   ALT 19 10/02/2018 1408   BILITOT 0.4 10/02/2018 1408       RADIOGRAPHIC STUDIES: I have personally reviewed the radiological images as listed and agreed with the findings in the report. Ct Abdomen Pelvis W Contrast  Result Date: 09/22/2018 CLINICAL DATA:  Newly diagnosed endometrial carcinoma.  Staging. Creatinine was obtained on site at [facility] at [address] Results: Creatinine 0.7 mg/dL. EXAM: CT ABDOMEN AND PELVIS WITH CONTRAST TECHNIQUE: Multidetector CT imaging of the abdomen and pelvis was performed using the standard protocol following bolus administration of intravenous contrast. CONTRAST:  132mL ISOVUE-300 IOPAMIDOL (ISOVUE-300) INJECTION 61% COMPARISON:  None. FINDINGS: Lower Chest: No acute findings. Hepatobiliary: No hepatic masses identified. Two small cysts are seen in the left hepatic lobe. Moderate diffuse hepatic steatosis noted. Prior cholecystectomy. No evidence of biliary obstruction. Pancreas:  No mass or inflammatory changes. Spleen: Within normal limits in size and appearance. Adrenals/Urinary Tract: No masses identified. No evidence of hydronephrosis. Unremarkable unopacified urinary bladder. Stomach/Bowel: No evidence of obstruction, inflammatory process or abnormal fluid collections. Mild diverticulosis is seen involving the descending and sigmoid colon, without evidence of diverticulitis. Vascular/Lymphatic: Pelvic lymphadenopathy is seen in the left common and external iliac chains, with largest lymph node in the left common iliac region measuring 2.5 cm on image 57/2. Mild right external iliac lymphadenopathy is seen with largest lymph node measuring 1.8 cm  on image 62/2. Retroperitoneal lymphadenopathy is seen throughout the aortocaval and left paraaortic spaces, with largest lymph node measuring 1.7 cm on image 39/2. A 10 mm right retrocaval lymph node is also seen. No abdominal aortic aneurysm. Reproductive: Diffuse endometrial thickening is seen measuring approximately 25 mm, consistent with known endometrial carcinoma. No evidence of extra uterine extension. Adnexal regions are unremarkable. Other:  None. Musculoskeletal:  No suspicious bone lesions identified. IMPRESSION: Diffuse endometrial thickening, consistent with primary endometrial carcinoma. Pelvic and abdominal retroperitoneal and retrocrural lymphadenopathy, consistent with metastatic disease. Moderate hepatic steatosis. Electronically Signed   By: Earle Gell M.D.   On: 09/22/2018 08:48   US Breast Ltd  Uni Right Inc Axilla  Result Date: 09/22/2018 CLINICAL DATA:  Screening recall for possible right breast mass. EXAM: DIGITAL DIAGNOSTIC UNILATERAL RIGHT MAMMOGRAM WITH CAD AND TOMO RIGHT BREAST ULTRASOUND COMPARISON:  Previous exam(s). ACR Breast Density Category b: There are scattered areas of fibroglandular density. FINDINGS: Additional tomograms were performed of the right breast. The initially questioned possible right breast mass is less apparent appears low density with possible internal fat suggestive of a benign intramammary lymph node. Mammographic images were processed with CAD. Targeted ultrasound of the entire outer right breast was performed. There is an intramammary lymph node in the right at 9 o'clock 4 cm from nipple measuring 0.7 x 0.3 x 0.6 cm. This is felt to correspond well with the mass seen in the right breast at mammography. No suspicious abnormality seen in the outer right breast. IMPRESSION: No findings of malignancy in the right breast. RECOMMENDATION: Recommend annual routine screening mammography, due November 2020. I have discussed the findings and recommendations with  the patient. Results were also provided in writing at the conclusion of the visit. If applicable, a reminder letter will be sent to the patient regarding the next appointment. BI-RADS CATEGORY  2: Benign. Electronically Signed   By: Everlean Alstrom M.D.   On: 09/22/2018 10:11   Mm Diag Breast Tomo Uni Right  Result Date: 09/22/2018 CLINICAL DATA:  Screening recall for possible right breast mass. EXAM: DIGITAL DIAGNOSTIC UNILATERAL RIGHT MAMMOGRAM WITH CAD AND TOMO RIGHT BREAST ULTRASOUND COMPARISON:  Previous exam(s). ACR Breast Density Category b: There are scattered areas of fibroglandular density. FINDINGS: Additional tomograms were performed of the right breast. The initially questioned possible right breast mass is less apparent appears low density with possible internal fat suggestive of a benign intramammary lymph node. Mammographic images were processed with CAD. Targeted ultrasound of the entire outer right breast was performed. There is an intramammary lymph node in the right at 9 o'clock 4 cm from nipple measuring 0.7 x 0.3 x 0.6 cm. This is felt to correspond well with the mass seen in the right breast at mammography. No suspicious abnormality seen in the outer right breast. IMPRESSION: No findings of malignancy in the right breast. RECOMMENDATION: Recommend annual routine screening mammography, due November 2020. I have discussed the findings and recommendations with the patient. Results were also provided in writing at the conclusion of the visit. If applicable, a reminder letter will be sent to the patient regarding the next appointment. BI-RADS CATEGORY  2: Benign. Electronically Signed   By: Everlean Alstrom M.D.   On: 09/22/2018 10:11   Ir Imaging Guided Port Insertion  Result Date: 10/01/2018 INDICATION: 67 year old female with uterine cancer. She presents for placement of a port catheter for chemotherapy. EXAM: IMPLANTED PORT A CATH PLACEMENT WITH ULTRASOUND AND FLUOROSCOPIC GUIDANCE  MEDICATIONS: Ancef 3 gm IV; The antibiotic was administered within an appropriate time interval prior to skin puncture. ANESTHESIA/SEDATION: Versed 2 mg IV; Fentanyl 75 mcg IV; Moderate Sedation Time:  19 minutes The patient was continuously monitored during the procedure by the interventional radiology nurse under my direct supervision. FLUOROSCOPY TIME:  0 minutes, 18 seconds (4 mGy) COMPLICATIONS: None immediate. PROCEDURE: The right neck and chest was prepped with chlorhexidine, and draped in the usual sterile fashion using maximum barrier technique (cap and mask, sterile gown, sterile gloves, large sterile sheet, hand hygiene and cutaneous antiseptic). Antibiotic prophylaxis was provided with 3g Ancef administered IV one hour prior to skin incision. Local anesthesia was attained by infiltration with  1% lidocaine with epinephrine. Ultrasound demonstrated patency of the right internal jugular vein, and this was documented with an image. Under real-time ultrasound guidance, this vein was accessed with a 21 gauge micropuncture needle and image documentation was performed. A small dermatotomy was made at the access site with an 11 scalpel. A 0.018" wire was advanced into the SVC and the access needle exchanged for a 67F micropuncture vascular sheath. The 0.018" wire was then removed and a 0.035" wire advanced into the IVC. An appropriate location for the subcutaneous reservoir was selected below the clavicle and an incision was made through the skin and underlying soft tissues. The subcutaneous tissues were then dissected using a combination of blunt and sharp surgical technique and a pocket was formed. A single lumen power injectable portacatheter was then tunneled through the subcutaneous tissues from the pocket to the dermatotomy and the port reservoir placed within the subcutaneous pocket. The venous access site was then serially dilated and a peel away vascular sheath placed over the wire. The wire was removed  and the port catheter advanced into position under fluoroscopic guidance. The catheter tip is positioned in the superior cavoatrial junction. This was documented with a spot image. The portacatheter was then tested and found to flush and aspirate well. The port was flushed with saline followed by 100 units/mL heparinized saline. The pocket was then closed in two layers using first subdermal inverted interrupted absorbable sutures followed by a running subcuticular suture. The epidermis was then sealed with Dermabond. The dermatotomy at the venous access site was also closed with a single inverted subdermal suture and the epidermis sealed with Dermabond. IMPRESSION: Successful placement of a right IJ approach Power Port with ultrasound and fluoroscopic guidance. The catheter is ready for use. Electronically Signed   By: Jacqulynn Cadet M.D.   On: 10/01/2018 11:17    All questions were answered. The patient knows to call the clinic with any problems, questions or concerns. No barriers to learning was detected.  I spent 25 minutes counseling the patient face to face. The total time spent in the appointment was 30 minutes and more than 50% was on counseling and review of test results  Heath Lark, MD 10/03/2018 9:15 AM

## 2018-10-06 ENCOUNTER — Telehealth: Payer: Self-pay | Admitting: Oncology

## 2018-10-06 ENCOUNTER — Other Ambulatory Visit: Payer: Self-pay | Admitting: Hematology and Oncology

## 2018-10-06 NOTE — Telephone Encounter (Signed)
Kathryn [name] called and said she is having trouble sleeping since her treatment on 10/03/18.  She is wondering if Dr. Alvy Bimler could send her something to help her sleep.  Suggested taking Benadryl and she said she is already taking 100 mg twice a day.

## 2018-10-06 NOTE — Telephone Encounter (Signed)
The pain medication, gabapentin and benadryl and additional sleep medication can increase her risks of falls I can prescribe Ambien if she is willing to accept the risks Typically, the effects of dexamethasone wears off after 3 days or so

## 2018-10-07 ENCOUNTER — Other Ambulatory Visit: Payer: Self-pay | Admitting: Hematology and Oncology

## 2018-10-07 DIAGNOSIS — G4701 Insomnia due to medical condition: Secondary | ICD-10-CM | POA: Insufficient documentation

## 2018-10-07 DIAGNOSIS — C541 Malignant neoplasm of endometrium: Secondary | ICD-10-CM

## 2018-10-07 MED ORDER — ZOLPIDEM TARTRATE 5 MG PO TABS: 5.0000 mg | ORAL_TABLET | Freq: Every evening | ORAL | 0 refills | Status: DC | PRN

## 2018-10-07 NOTE — Telephone Encounter (Signed)
Called Kathryn [name] and advised her of message from Dr. Alvy Bimler.  She said she is only sleeping about an hour and a half at a time during the night.  She would like to try Ambien and is aware of the risk for falls.  She does have family staying with her until Friday.  She would like the prescription sent to [facility].

## 2018-10-07 NOTE — Telephone Encounter (Signed)
Done

## 2018-10-10 ENCOUNTER — Other Ambulatory Visit (HOSPITAL_COMMUNITY): Payer: Medicare Other

## 2018-10-13 ENCOUNTER — Telehealth: Payer: Self-pay | Admitting: Oncology

## 2018-10-13 ENCOUNTER — Encounter: Payer: Self-pay | Admitting: Oncology

## 2018-10-13 DIAGNOSIS — C541 Malignant neoplasm of endometrium: Secondary | ICD-10-CM

## 2018-10-13 NOTE — Telephone Encounter (Signed)
Received call from [place] at [facility] that a lavander tube of blood will be needed for MSI testing.  Called patient and she would be able to have labs drawn at 10 am on 10/16/2018.  Kathryn [name] also said that she used the Ambien for 3 days and does not have to use it now.  Scheduling message sent.

## 2018-10-14 NOTE — Addendum Note (Signed)
Addended by: Elmo Putt R on: 10/14/2018 09:30 AM   Modules accepted: Orders

## 2018-10-14 NOTE — Telephone Encounter (Signed)
Called Kathryn [name] with [facility] for labs and port flush on 10/16/18.  She verbalized understanding and agreement.

## 2018-10-16 ENCOUNTER — Inpatient Hospital Stay: Payer: Medicare Other | Attending: Hematology and Oncology

## 2018-10-16 ENCOUNTER — Inpatient Hospital Stay: Payer: Medicare Other

## 2018-10-16 ENCOUNTER — Other Ambulatory Visit: Payer: Self-pay

## 2018-10-16 ENCOUNTER — Ambulatory Visit: Payer: Medicare Other | Attending: Hematology and Oncology | Admitting: Physical Therapy

## 2018-10-16 DIAGNOSIS — M961 Postlaminectomy syndrome, not elsewhere classified: Secondary | ICD-10-CM | POA: Diagnosis not present

## 2018-10-16 DIAGNOSIS — M6281 Muscle weakness (generalized): Secondary | ICD-10-CM | POA: Diagnosis not present

## 2018-10-16 DIAGNOSIS — M5441 Lumbago with sciatica, right side: Secondary | ICD-10-CM | POA: Insufficient documentation

## 2018-10-16 DIAGNOSIS — G63 Polyneuropathy in diseases classified elsewhere: Secondary | ICD-10-CM | POA: Diagnosis not present

## 2018-10-16 DIAGNOSIS — R110 Nausea: Secondary | ICD-10-CM | POA: Insufficient documentation

## 2018-10-16 DIAGNOSIS — R739 Hyperglycemia, unspecified: Secondary | ICD-10-CM | POA: Diagnosis not present

## 2018-10-16 DIAGNOSIS — G8929 Other chronic pain: Secondary | ICD-10-CM | POA: Diagnosis not present

## 2018-10-16 DIAGNOSIS — C541 Malignant neoplasm of endometrium: Secondary | ICD-10-CM | POA: Insufficient documentation

## 2018-10-16 DIAGNOSIS — G894 Chronic pain syndrome: Secondary | ICD-10-CM | POA: Diagnosis not present

## 2018-10-16 DIAGNOSIS — K5909 Other constipation: Secondary | ICD-10-CM | POA: Diagnosis not present

## 2018-10-16 DIAGNOSIS — R262 Difficulty in walking, not elsewhere classified: Secondary | ICD-10-CM | POA: Insufficient documentation

## 2018-10-16 DIAGNOSIS — Z5111 Encounter for antineoplastic chemotherapy: Secondary | ICD-10-CM | POA: Diagnosis not present

## 2018-10-16 DIAGNOSIS — M5442 Lumbago with sciatica, left side: Secondary | ICD-10-CM | POA: Diagnosis not present

## 2018-10-16 DIAGNOSIS — M5416 Radiculopathy, lumbar region: Secondary | ICD-10-CM | POA: Diagnosis not present

## 2018-10-16 DIAGNOSIS — M549 Dorsalgia, unspecified: Secondary | ICD-10-CM | POA: Diagnosis not present

## 2018-10-16 DIAGNOSIS — Z79891 Long term (current) use of opiate analgesic: Secondary | ICD-10-CM | POA: Diagnosis not present

## 2018-10-16 MED ORDER — SODIUM CHLORIDE 0.9% FLUSH
10.0000 mL | Freq: Once | INTRAVENOUS | Status: AC
  Administered 2018-10-16: 10 mL
  Filled 2018-10-16: qty 10

## 2018-10-16 MED ORDER — HEPARIN SOD (PORK) LOCK FLUSH 100 UNIT/ML IV SOLN
500.0000 [IU] | Freq: Once | INTRAVENOUS | Status: AC
  Administered 2018-10-16: 500 [IU]
  Filled 2018-10-16: qty 5

## 2018-10-16 MED FILL — OXYCODONE-APAP 10-325: 10-325 | 30 days supply | Qty: 240 | Fill #0

## 2018-10-16 NOTE — Patient Instructions (Signed)

## 2018-10-16 NOTE — Therapy (Signed)
[place], [place], [address] Phone: [phone]   Fax:  [phone]  Physical Therapy Evaluation  Patient Details  Name: Kathryn [name] MRN: [id number] Date of Birth: 01-Aug-1951 Referring Provider (PT): Dr. Alvy Bimler    Encounter Date: 10/16/2018  PT End of Session - 10/16/18 1338    Visit Number  1    Number of Visits  16   Pt may not have regular visits due to chemo so will no need all these visits    Date for PT Re-Evaluation  12/15/18    PT Start Time  1100    PT Stop Time  1215    PT Time Calculation (min)  75 min    Activity Tolerance  Patient tolerated treatment well    Behavior During Therapy  [facility] for tasks assessed/performed       Past Medical History:  Diagnosis Date  . Arthritis   . Hypertension     Past Surgical History:  Procedure Laterality Date  . ANKLE SURGERY Right    right fusion  . BACK SURGERY     5 surgeries at [facility]; has screws and a "cage"  . CARPAL TUNNEL RELEASE Bilateral   . COLONOSCOPY    . IR IMAGING GUIDED PORT INSERTION  10/01/2018  . KNEE SURGERY Left   . LAPAROSCOPIC CHOLECYSTECTOMY    . LUMBAR LAMINECTOMY/DECOMPRESSION MICRODISCECTOMY Left 08/06/2013   Procedure: LUMBAR LAMINECTOMY/DECOMPRESSION MICRODISCECTOMY 1 LEVEL;  Surgeon: Ophelia Charter, MD;  Location: [place] [facility];  Service: Neurosurgery;  Laterality: Left;  redo LEFT Lumbar Five-Sacral One diskectomy  . PLACEMENT OF LUMBAR DRAIN N/A 08/07/2013   Procedure: PLACEMENT OF LUMBAR DRAIN;  Surgeon: Ophelia Charter, MD;  Location: [place];  Service: Neurosurgery;  Laterality: N/A;  . TONSILLECTOMY    . TUBAL LIGATION  1982    There were no vitals filed for this visit.   Subjective Assessment - 10/16/18 1102    Subjective  She has nerve damage on the left leg with numbness in her left foot and she has nerve pain in the right leg.  She has had physical therapy in the past and it helped her recover  from the surgeries.  [facility] [place] but can't get into the steps anymore and the lift is ususally not working. She has $30 copay for each visit.     Pertinent History  Endometrial cancer diagnosed September 01, 2018  First  chemotherapy Dec. 20  and will have 3 treatments , one every 3 weeks, then retesting and possibly surgery and then 3 more chemos. but that schedule may change  past history includes 6 back surgeries from L1 down starting in 1974 due to degenerative disc disease from the neck down She had an MRI in May of 2018 and was told there were so many areas of compression that it was not able to tell where leg pain was coming from.  She is followed by Dr. Koleen Nimrod at [facility] and she has epidurals but is not able have them anymore because there is no space for  the needle     Limitations  Sitting    How long can you sit comfortably?  45 minutes    pt tries to get up and moves every 30 minutes    How long can you stand comfortably?  45 minutes gets pain in left leg     How long can you walk comfortably?  gets SOB after only a few minutes and has sharp pain in left leg     Patient Stated Goals  She is hoping that she can get more strength in her legs. Pt lives in [place] and is hoping to learn what she needs to do at home     Currently in Pain?  No/denies   feels numb, but not sharp pain         [facility] PT Assessment - 10/16/18 0001      Assessment   Medical Diagnosis  Endometrial cancer     Referring Provider (PT)  Dr. Alvy Bimler     Onset Date/Surgical Date  09/01/18    Hand Dominance  Right      Precautions   Precautions  None;Knee      Restrictions   Weight Bearing Restrictions  No      Balance Screen   Has the patient fallen in the past 6 months  No    Has the patient had a decrease in activity level because of a fear of falling?   No    Is the patient reluctant to leave their home because of a fear of falling?   No      Home Environment    Living Environment  Private residence    Living Arrangements  Alone    Available Help at Discharge  Family;Available PRN/intermittently   daughter and son live within 10 minutes of pt    Additional Comments  daughter teaches yoga classes at the Y       Prior Function   Level of Independence  Independent    Vocation  On disability    Leisure  likes to read,  crochet, mends clothes for grandkids       Cognition   Overall Cognitive Status  Within Functional Limits for tasks assessed      Observation/Other Assessments   Observations  pt comes in using a straight cane, It takes her extra time.  Port in right upper chest  She has smaller upper body but larget hips and thighs with visible lobules above both knees, L>R.  She has well healed scars on low back with visible soft tissue tightness     Skin Integrity  no open areas     Other Surveys   Other Surveys    Quick DASH   31.82      Sensation   Light Touch  Impaired by gross assessment    Additional Comments  numbness in left lateral leg and foot       Coordination   Gross Motor Movements are Fluid and Coordinated  No   moves slowly, impaired by pain      Functional Tests   Functional tests  Sit to Stand      Sit to Stand   Comments  11 repetitions in 30 second but feels short of breath, can still talk       Posture/Postural Control   Posture/Postural Control  Postural limitations    Postural Limitations  Rounded Shoulders;Forward head;Increased lumbar lordosis    Posture Comments  Pt has limited mobility at her lumbar spine       ROM / Strength   AROM / PROM / Strength  AROM;Strength      AROM   Overall AROM   Deficits    Overall AROM Comments  very limited lumbar spine moiblity.  While she can touch her toes, she has no lumbar flexion visible     AROM Assessment  Site  Shoulder;Lumbar    Lumbar Flexion  very limited     Lumbar Extension  very limited     Lumbar - Right Rotation  moderately limited     Lumbar - Left  Rotation  very limited due to pain       Strength   Overall Strength  Deficits    Overall Strength Comments  decreased strength in left leg  pt has increased pain with attempts at bridging     Right Hand Grip (lbs)  48/45/42    Left Hand Grip (lbs)  45/40/45    Right Hip Flexion  4/5    Right Hip ABduction  2/5    Left Hip Flexion  3-/5    Left Hip ABduction  2/5    Right Knee Flexion  4+/5    Right Knee Extension  4+/5    Left Knee Flexion  3/5    Left Knee Extension  3/5    Right Ankle Dorsiflexion  4+/5    Right Ankle Plantar Flexion  2/5    Left Ankle Dorsiflexion  4+/5    Left Ankle Plantar Flexion  2/5      Bed Mobility   Bed Mobility  Rolling Right;Rolling Left;Supine to Sit;Sit to Supine    Rolling Right  Independent   slow and painful    Rolling Left  Independent   slow and painful    Supine to Sit  Independent   increased pain    Sit to Supine  Independent   needs help lifting left leg up onto mat      Ambulation/Gait   Ambulation/Gait  Yes    Assistive device  Straight cane    Gait Pattern  Step-through pattern   carries cane in right hand    Gait Comments  pt asking about a rolling walker in the event she needs it during chemo.  She would likely benefit from a wide  or extra wide 2 wheeled walker as she would be able to step up into it.  The rollator walker with a seat requireds people to lean forward to push it and that position may aggravater her back pain       Standardized Balance Assessment   Standardized Balance Assessment  Timed Up and Go Test      Timed Up and Go Test   TUG  Normal TUG    Normal TUG (seconds)  12.76   no cane     High Level Balance   High Level Balance Comments  pt has difficulty standing on one leg due to back pain              Katina Dung - 10/16/18 0001    Open a tight or new jar  Moderate difficulty    Do heavy household chores (wash walls, wash floors)  Unable    Carry a shopping bag or briefcase  Mild difficulty     Wash your back  Severe difficulty    Use a knife to cut food  No difficulty    Recreational activities in which you take some force or impact through your arm, shoulder, or hand (golf, hammering, tennis)  Severe difficulty    During the past week, to what extent has your arm, shoulder or hand problem interfered with your normal social activities with family, friends, neighbors, or groups?  Not at all    During the past week, to what extent has your arm, shoulder or hand problem limited your work  or other regular daily activities  Not at all    Arm, shoulder, or hand pain.  None    Tingling (pins and needles) in your arm, shoulder, or hand  Mild    Difficulty Sleeping  No difficulty    DASH Score  31.82 %        Objective measurements completed on examination: See above findings.                PT Short Term Goals - 10/16/18 1549      PT SHORT TERM GOAL #1   Title  Pt will report she is able to do a home exercise program and document her progress at home     Time  4    Period  Weeks    Status  New        PT Long Term Goals - 10/16/18 1550      PT LONG TERM GOAL #1   Title  Pt will decrease her time for Normal TUG to < 10 seconds indicating an improvment in balance and gait    Baseline  12.76    Time  8    Period  Weeks    Status  New      PT LONG TERM GOAL #2   Title  Pt with increase # of reps of sit to stand in 30 seconds to < 13 indicating and increase in general strength    Baseline  11    Time  8    Period  Weeks    Status  New      PT LONG TERM GOAL #3   Title  Pt will report she is able to climb steps with 25% less difficulty so that she can get in and out of the pool for water exercise     Time  8    Period  Weeks    Status  New             Plan - 10/16/18 1339    Clinical Impression Statement  67 yo female with chronic back pain and decreased mobility for many years has recent diagnosis of endometrial cancer that will require chemotherapy  and likely surgery.  She wants to learn an exercise program so that she can maintain her strengh during her chemo and eventually return to [facility]  She would like to come to PT one time a week on weeks 2 and 3 of chemo to ,earn  a home exercise program she can follow through on her own at home. She would like to bring her daughter with her to PT sessions to help her be accountable.  Pt is interested in having a [employer] with videos and and written copy.     History and Personal Factors relevant to plan of care:  lives in [place]( far away from our clinic), lives alone, chronic back pain with limited mobility previously     Clinical Presentation  Evolving    Clinical Presentation due to:  will be having chemo    Clinical Decision Making  Moderate    Rehab Potential  Good    Clinical Impairments Affecting Rehab Potential  limited back mobility and diffuse degernative disc disease , pt will not be able to come the week of chemo     PT Frequency  1x / week    PT Duration  8 weeks    PT Treatment/Interventions  ADLs/Self Care Home Management;DME Instruction;Gait  training;Stair training;Functional mobility training;Cognitive remediation;Neuromuscular re-education;Therapeutic exercise;Therapeutic activities;Patient/family education;Energy conservation    PT Next Visit Plan  teach pt ( and daughter) home exercise program that she can follow through at home     Consulted and Agree with Plan of Care  Patient       Patient will benefit from skilled therapeutic intervention in order to improve the following deficits and impairments:  Abnormal gait, Decreased knowledge of use of DME, Pain, Postural dysfunction, Increased muscle spasms, Decreased scar mobility, Decreased mobility, Decreased activity tolerance, Decreased endurance, Decreased strength, Impaired perceived functional ability, Obesity, Difficulty walking, Decreased balance, Decreased knowledge of precautions  Visit  Diagnosis: Difficulty in walking - Plan: PT plan of care cert/re-cert  Muscle weakness (generalized) - Plan: PT plan of care cert/re-cert  Chronic bilateral low back pain with bilateral sciatica - Plan: PT plan of care cert/re-cert     Problem List Patient Active Problem List   Diagnosis Date Noted  . Insomnia due to medical condition 10/07/2018  . Chronic back pain greater than 3 months duration 09/26/2018  . Neuropathy due to medical condition ([place]) 09/26/2018  . Physical debility 09/26/2018  . Morbid (severe) obesity due to excess calories ([place]) 09/26/2018  . Endometrial cancer ([place]) 09/24/2018  . Adenopathy 09/24/2018   Donato Heinz. Owens Shark PT  Norwood Levo 10/16/2018, 3:55 PM  [place], [place], [address] Phone: [phone]   Fax:  [phone]  Name: NICKY MILHOUSE MRN: [id number] Date of Birth: 06-24-1951

## 2018-10-21 ENCOUNTER — Encounter: Payer: Self-pay | Admitting: Hematology and Oncology

## 2018-10-21 ENCOUNTER — Ambulatory Visit: Payer: Medicare Other

## 2018-10-21 DIAGNOSIS — R262 Difficulty in walking, not elsewhere classified: Secondary | ICD-10-CM | POA: Diagnosis not present

## 2018-10-21 DIAGNOSIS — M5441 Lumbago with sciatica, right side: Secondary | ICD-10-CM

## 2018-10-21 DIAGNOSIS — M5442 Lumbago with sciatica, left side: Secondary | ICD-10-CM | POA: Diagnosis not present

## 2018-10-21 DIAGNOSIS — G8929 Other chronic pain: Secondary | ICD-10-CM | POA: Diagnosis not present

## 2018-10-21 DIAGNOSIS — M6281 Muscle weakness (generalized): Secondary | ICD-10-CM | POA: Diagnosis not present

## 2018-10-21 NOTE — Therapy (Signed)
[place], [place], [address] Phone: [phone]   Fax:  [phone]  Physical Therapy Treatment  Patient Details  Name: Kathryn [name] MRN: [id number] Date of Birth: 1951-04-09 Referring Provider (PT): Dr. Alvy Bimler    Encounter Date: 10/21/2018  PT End of Session - 10/21/18 1007    Visit Number  2    Number of Visits  16   pt may not have regular visits due to chemo    Date for PT Re-Evaluation  12/15/18    PT Start Time  0910    PT Stop Time  1007    PT Time Calculation (min)  57 min    Activity Tolerance  Patient tolerated treatment well    Behavior During Therapy  [facility] for tasks assessed/performed       Past Medical History:  Diagnosis Date  . Arthritis   . Hypertension     Past Surgical History:  Procedure Laterality Date  . ANKLE SURGERY Right    right fusion  . BACK SURGERY     5 surgeries at [facility]; has screws and a "cage"  . CARPAL TUNNEL RELEASE Bilateral   . COLONOSCOPY    . IR IMAGING GUIDED PORT INSERTION  10/01/2018  . KNEE SURGERY Left   . LAPAROSCOPIC CHOLECYSTECTOMY    . LUMBAR LAMINECTOMY/DECOMPRESSION MICRODISCECTOMY Left 08/06/2013   Procedure: LUMBAR LAMINECTOMY/DECOMPRESSION MICRODISCECTOMY 1 LEVEL;  Surgeon: Ophelia Charter, MD;  Location: [place] [facility];  Service: Neurosurgery;  Laterality: Left;  redo LEFT Lumbar Five-Sacral One diskectomy  . PLACEMENT OF LUMBAR DRAIN N/A 08/07/2013   Procedure: PLACEMENT OF LUMBAR DRAIN;  Surgeon: Ophelia Charter, MD;  Location: [place];  Service: Neurosurgery;  Laterality: N/A;  . TONSILLECTOMY    . TUBAL LIGATION  1982    There were no vitals filed for this visit.  Subjective Assessment - 10/21/18 0916    Subjective  I'm getting my second chemo this Friday so I didn't make an appt for next week with you guys.   (Pended)     Pertinent History  Endometrial cancer diagnosed September 01, 2018  First  chemotherapy Dec. 20  and will  have 3 treatments , one every 3 weeks, then retesting and possibly surgery and then 3 more chemos. but that schedule may change  past history includes 6 back surgeries from L1 down starting in 1974 due to degenerative disc disease from the neck down She had an MRI in May of 2018 and was told there were so many areas of compression that it was not able to tell where leg pain was coming from.  She is followed by Dr. Koleen Nimrod at [facility] and she has epidurals but is not able have them anymore because there is no space for  the needle   (Pended)     Patient Stated Goals  She is hoping that she can get more strength in her legs. Pt lives in [place] and is hoping to learn what she needs to do at home   [facility])     Currently in Pain?  No/denies  (Pended)                        [facility] Adult PT Treatment/Exercise - 10/21/18 0001      Self-Care   Self-Care  Other Self-Care Comments    Other Self-Care Comments   Spent time educating about importance of increasing walking time as able throughout  day      Knee/Hip Exercises: Standing   Functional Squat  10 reps   in front of chair for partial squat   Other Standing Knee Exercises  At back of bike for front to back weight shifting x30 sec each, then with arm swing (one arm as other was holding bike for balance) x30 sec each with pt returning therapist demonstration      Knee/Hip Exercises: Seated   Long Arc Quad  Strengthening;Right;Left;10 reps    Long Arc Quad Limitations  VCs to hold for at least 3 sec    Marching  Strengthening;Right;Left;5 reps    Marching Limitations  VCs for no weight shifting, pt able to return good demo      Shoulder Exercises: Seated   Horizontal ABduction  Strengthening;Both;5 reps;Theraband    Theraband Level (Shoulder Horizontal ABduction)  Level 1 (Yellow)    External Rotation  Strengthening;Both;5 reps;Theraband    Theraband Level (Shoulder External Rotation)  Level 1 (Yellow)    Flexion   AROM;Right;Left;5 reps    Flexion Limitations  Then with reaching across body and slight trunk rotation at end of motion with VCs to not feel any pain             PT Education - 10/21/18 1019    Education Details  Seated bil UE and LE exercises, and standing (see instructions); also spent time encouraging pt to increase walking time around house as able (i.e. walk extra before sitting back down after going to bathroom or before going to kitchen to prepare lunch)    Person(s) Educated  Patient    Methods  Explanation;Demonstration;Handout    Comprehension  Returned demonstration;Verbalized understanding;Need further instruction       PT Short Term Goals - 10/16/18 1549      PT SHORT TERM GOAL #1   Title  Pt will report she is able to do a home exercise program and document her progress at home     Time  4    Period  Weeks    Status  New        PT Long Term Goals - 10/16/18 1550      PT LONG TERM GOAL #1   Title  Pt will decrease her time for Normal TUG to < 10 seconds indicating an improvment in balance and gait    Baseline  12.76    Time  8    Period  Weeks    Status  New      PT LONG TERM GOAL #2   Title  Pt with increase # of reps of sit to stand in 30 seconds to < 13 indicating and increase in general strength    Baseline  11    Time  8    Period  Weeks    Status  New      PT LONG TERM GOAL #3   Title  Pt will report she is able to climb steps with 25% less difficulty so that she can get in and out of the pool for water exercise     Time  8    Period  Weeks    Status  New            Plan - 10/21/18 1041    Clinical Impression Statement  First session today of exercising which pt tolerated very well. Was able to add varying seated exercises and 2 in standing to HEP. Pt reported feeling good at end of session with  no increased knee or back pain. Also spent time encouraging her to increase walking at home when able. Issued HEP in form of [employer] as pt  reports her daughter can upload this onto her computer for her, and paper copy was issued as well.     Rehab Potential  Good    Clinical Impairments Affecting Rehab Potential  limited back mobility and diffuse degernative disc disease , pt will not be able to come the week of chemo     PT Frequency  1x / week    PT Duration  8 weeks    PT Treatment/Interventions  ADLs/Self Care Home Management;DME Instruction;Gait training;Stair training;Functional mobility training;Cognitive remediation;Neuromuscular re-education;Therapeutic exercise;Therapeutic activities;Patient/family education;Energy conservation    PT Next Visit Plan  Cont with home exercise program that she can follow through at home and review what was issued today assessing technique    Consulted and Agree with Plan of Care  Patient       Patient will benefit from skilled therapeutic intervention in order to improve the following deficits and impairments:  Abnormal gait, Decreased knowledge of use of DME, Pain, Postural dysfunction, Increased muscle spasms, Decreased scar mobility, Decreased mobility, Decreased activity tolerance, Decreased endurance, Decreased strength, Impaired perceived functional ability, Obesity, Difficulty walking, Decreased balance, Decreased knowledge of precautions  Visit Diagnosis: Difficulty in walking  Muscle weakness (generalized)  Chronic bilateral low back pain with bilateral sciatica     Problem List Patient Active Problem List   Diagnosis Date Noted  . Insomnia due to medical condition 10/07/2018  . Chronic back pain greater than 3 months duration 09/26/2018  . Neuropathy due to medical condition ([place]) 09/26/2018  . Physical debility 09/26/2018  . Morbid (severe) obesity due to excess calories ([place]) 09/26/2018  . Endometrial cancer ([place]) 09/24/2018  . Adenopathy 09/24/2018    Otelia Limes, PTA 10/21/2018, 10:50 AM  [place], [place], [address] Phone: [phone]   Fax:  [phone]  Name: LATONIA CONROW MRN: [id number] Date of Birth: 10-03-1951

## 2018-10-21 NOTE — Progress Notes (Signed)
Returned patient's call from voicemail she left regarding applying for the [employer].  Advised patient proof of her income would be needed and confirmed that the verbal amount she quoted would qualify. Patient will bring proof of income on 10/24/17.  She has my card for any additional financial questions or concerns.

## 2018-10-24 ENCOUNTER — Inpatient Hospital Stay (HOSPITAL_BASED_OUTPATIENT_CLINIC_OR_DEPARTMENT_OTHER): Payer: Medicare Other | Admitting: Hematology and Oncology

## 2018-10-24 ENCOUNTER — Encounter: Payer: Self-pay | Admitting: Hematology and Oncology

## 2018-10-24 ENCOUNTER — Telehealth: Payer: Self-pay | Admitting: Hematology and Oncology

## 2018-10-24 ENCOUNTER — Inpatient Hospital Stay: Payer: Medicare Other

## 2018-10-24 DIAGNOSIS — C541 Malignant neoplasm of endometrium: Secondary | ICD-10-CM

## 2018-10-24 DIAGNOSIS — R110 Nausea: Secondary | ICD-10-CM | POA: Diagnosis not present

## 2018-10-24 DIAGNOSIS — Z5111 Encounter for antineoplastic chemotherapy: Secondary | ICD-10-CM | POA: Diagnosis not present

## 2018-10-24 DIAGNOSIS — G8929 Other chronic pain: Secondary | ICD-10-CM

## 2018-10-24 DIAGNOSIS — M549 Dorsalgia, unspecified: Secondary | ICD-10-CM

## 2018-10-24 DIAGNOSIS — T451X5A Adverse effect of antineoplastic and immunosuppressive drugs, initial encounter: Secondary | ICD-10-CM

## 2018-10-24 DIAGNOSIS — R739 Hyperglycemia, unspecified: Secondary | ICD-10-CM | POA: Diagnosis not present

## 2018-10-24 DIAGNOSIS — K5909 Other constipation: Secondary | ICD-10-CM | POA: Diagnosis not present

## 2018-10-24 DIAGNOSIS — G63 Polyneuropathy in diseases classified elsewhere: Secondary | ICD-10-CM

## 2018-10-24 LAB — CMP (CANCER CENTER ONLY)
ALK PHOS: 124 U/L (ref 38–126)
ALT: 21 U/L (ref 0–44)
AST: 18 U/L (ref 15–41)
Albumin: 4.1 g/dL (ref 3.5–5.0)
Anion gap: 12 (ref 5–15)
BUN: 19 mg/dL (ref 8–23)
CO2: 26 mmol/L (ref 22–32)
CREATININE: 0.81 mg/dL (ref 0.44–1.00)
Calcium: 9.6 mg/dL (ref 8.9–10.3)
Chloride: 103 mmol/L (ref 98–111)
GFR, Est AFR Am: >60 mL/min (ref >60)
GFR, Estimated: >60 mL/min (ref >60)
Glucose, Bld: 139 mg/dL — ABNORMAL HIGH (ref 70–99)
Potassium: 4.1 mmol/L (ref 3.5–5.1)
Sodium: 141 mmol/L (ref 135–145)
Total Bilirubin: 0.3 mg/dL (ref 0.3–1.2)
Total Protein: 8.1 g/dL (ref 6.5–8.1)

## 2018-10-24 LAB — CBC WITH DIFFERENTIAL (CANCER CENTER ONLY)
ABS IMMATURE GRANULOCYTES: 0.07 10*3/uL (ref 0.00–0.07)
Basophils Absolute: 0 10*3/uL (ref 0.0–0.1)
Basophils Relative: 0 %
Eosinophils Absolute: 0 10*3/uL (ref 0.0–0.5)
Eosinophils Relative: 0 %
HCT: 44.7 % (ref 36.0–46.0)
HEMOGLOBIN: 14.9 g/dL (ref 12.0–15.0)
Immature Granulocytes: 1 %
Lymphocytes Relative: 17 %
Lymphs Abs: 1.3 10*3/uL (ref 0.7–4.0)
MCH: 29.7 pg (ref 26.0–34.0)
MCHC: 33.3 g/dL (ref 30.0–36.0)
MCV: 89 fL (ref 80.0–100.0)
Monocytes Absolute: 0.2 10*3/uL (ref 0.1–1.0)
Monocytes Relative: 3 %
NEUTROS ABS: 6 10*3/uL (ref 1.7–7.7)
Neutrophils Relative %: 79 %
Platelet Count: 210 10*3/uL (ref 150–400)
RBC: 5.02 MIL/uL (ref 3.87–5.11)
RDW: 14.3 % (ref 11.5–15.5)
WBC Count: 7.5 10*3/uL (ref 4.0–10.5)
nRBC: 0 % (ref 0.0–0.2)

## 2018-10-24 MED ORDER — HEPARIN SOD (PORK) LOCK FLUSH 100 UNIT/ML IV SOLN
500.0000 [IU] | Freq: Once | INTRAVENOUS | Status: AC | PRN
  Administered 2018-10-24: 500 [IU]
  Filled 2018-10-24: qty 5

## 2018-10-24 MED ORDER — FAMOTIDINE IN NACL 20-0.9 MG/50ML-% IV SOLN
20.0000 mg | Freq: Once | INTRAVENOUS | Status: AC
  Administered 2018-10-24: 20 mg via INTRAVENOUS

## 2018-10-24 MED ORDER — SODIUM CHLORIDE 0.9% FLUSH
10.0000 mL | Freq: Once | INTRAVENOUS | Status: AC
  Administered 2018-10-24: 10 mL
  Filled 2018-10-24: qty 10

## 2018-10-24 MED ORDER — SODIUM CHLORIDE 0.9 % IV SOLN
Freq: Once | INTRAVENOUS | Status: AC
  Administered 2018-10-24: 10:00:00 via INTRAVENOUS
  Filled 2018-10-24: qty 5

## 2018-10-24 MED ORDER — SODIUM CHLORIDE 0.9 % IV SOLN
105.0000 mg/m2 | Freq: Once | INTRAVENOUS | Status: AC
  Administered 2018-10-24: 258 mg via INTRAVENOUS
  Filled 2018-10-24: qty 43

## 2018-10-24 MED ORDER — SODIUM CHLORIDE 0.9 % IV SOLN
750.0000 mg | Freq: Once | INTRAVENOUS | Status: AC
  Administered 2018-10-24: 750 mg via INTRAVENOUS
  Filled 2018-10-24: qty 75

## 2018-10-24 MED ORDER — PALONOSETRON HCL INJECTION 0.25 MG/5ML
0.2500 mg | Freq: Once | INTRAVENOUS | Status: AC
  Administered 2018-10-24: 0.25 mg via INTRAVENOUS

## 2018-10-24 MED ORDER — SODIUM CHLORIDE 0.9% FLUSH
10.0000 mL | INTRAVENOUS | Status: DC | PRN
  Administered 2018-10-24: 10 mL
  Filled 2018-10-24: qty 10

## 2018-10-24 MED ORDER — SODIUM CHLORIDE 0.9 % IV SOLN
Freq: Once | INTRAVENOUS | Status: AC
  Administered 2018-10-24: 10:00:00 via INTRAVENOUS
  Filled 2018-10-24: qty 250

## 2018-10-24 MED ORDER — FAMOTIDINE IN NACL 20-0.9 MG/50ML-% IV SOLN
INTRAVENOUS | Status: AC
  Filled 2018-10-24: qty 50

## 2018-10-24 MED ORDER — PALONOSETRON HCL INJECTION 0.25 MG/5ML
INTRAVENOUS | Status: AC
  Filled 2018-10-24: qty 5

## 2018-10-24 NOTE — Assessment & Plan Note (Signed)
She denies vomiting I recommend scheduled Compazine for 2 to 3 days after chemo

## 2018-10-24 NOTE — Telephone Encounter (Signed)
Per 1/10 no los

## 2018-10-24 NOTE — Patient Instructions (Signed)
[facility] Discharge Instructions for Patients Receiving Chemotherapy  Today you received the following chemotherapy agents Taxol and Carboplatin  To help prevent nausea and vomiting after your treatment, we encourage you to take your nausea medication as prescribed by MD. **DO NOT TAKE ZOFRAN FOR 3 DAYS AFTER TREATMENT**   If you develop nausea and vomiting that is not controlled by your nausea medication, call the clinic.   BELOW ARE SYMPTOMS THAT SHOULD BE REPORTED IMMEDIATELY:  *FEVER GREATER THAN 100.5 F  *CHILLS WITH OR WITHOUT FEVER  NAUSEA AND VOMITING THAT IS NOT CONTROLLED WITH YOUR NAUSEA MEDICATION  *UNUSUAL SHORTNESS OF BREATH  *UNUSUAL BRUISING OR BLEEDING  TENDERNESS IN MOUTH AND THROAT WITH OR WITHOUT PRESENCE OF ULCERS  *URINARY PROBLEMS  *BOWEL PROBLEMS  UNUSUAL RASH Items with * indicate a potential emergency and should be followed up as soon as possible.  Feel free to call the clinic should you have any questions or concerns. The clinic phone number is [phone].  Please show the [facility] at check-in to the Emergency Department and triage nurse.  Paclitaxel injection What is this medicine? PACLITAXEL (PAK li TAX el) is a chemotherapy drug. It targets fast dividing cells, like cancer cells, and causes these cells to die. This medicine is used to treat ovarian cancer, breast cancer, lung cancer, Kaposi's sarcoma, and other cancers. This medicine may be used for other purposes; ask your health care provider or pharmacist if you have questions. COMMON BRAND NAME(S): Onxol, Taxol What should I tell my health care provider before I take this medicine? They need to know if you have any of these conditions: -history of irregular heartbeat -liver disease -low blood counts, like low white cell, platelet, or red cell counts -lung or breathing disease, like asthma -tingling of the fingers or toes, or other nerve disorder -an unusual  or allergic reaction to paclitaxel, alcohol, polyoxyethylated castor oil, other chemotherapy, other medicines, foods, dyes, or preservatives -pregnant or trying to get pregnant -breast-feeding How should I use this medicine? This drug is given as an infusion into a vein. It is administered in a hospital or clinic by a specially trained health care professional. Talk to your pediatrician regarding the use of this medicine in children. Special care may be needed. Overdosage: If you think you have taken too much of this medicine contact a poison control center or emergency room at once. NOTE: This medicine is only for you. Do not share this medicine with others. What if I miss a dose? It is important not to miss your dose. Call your doctor or health care professional if you are unable to keep an appointment. What may interact with this medicine? Do not take this medicine with any of the following medications: -disulfiram -metronidazole This medicine may also interact with the following medications: -antiviral medicines for hepatitis, HIV or AIDS -certain antibiotics like erythromycin and clarithromycin -certain medicines for fungal infections like ketoconazole and itraconazole -certain medicines for seizures like carbamazepine, phenobarbital, phenytoin -gemfibrozil -nefazodone -rifampin -St. John's wort This list may not describe all possible interactions. Give your health care provider a list of all the medicines, herbs, non-prescription drugs, or dietary supplements you use. Also tell them if you smoke, drink alcohol, or use illegal drugs. Some items may interact with your medicine. What should I watch for while using this medicine? Your condition will be monitored carefully while you are receiving this medicine. You will need important blood work done while you are taking  this medicine. This medicine can cause serious allergic reactions. To reduce your risk you will need to take other  medicine(s) before treatment with this medicine. If you experience allergic reactions like skin rash, itching or hives, swelling of the face, lips, or tongue, tell your doctor or health care professional right away. In some cases, you may be given additional medicines to help with side effects. Follow all directions for their use. This drug may make you feel generally unwell. This is not uncommon, as chemotherapy can affect healthy cells as well as cancer cells. Report any side effects. Continue your course of treatment even though you feel ill unless your doctor tells you to stop. Call your doctor or health care professional for advice if you get a fever, chills or sore throat, or other symptoms of a cold or flu. Do not treat yourself. This drug decreases your body's ability to fight infections. Try to avoid being around people who are sick. This medicine may increase your risk to bruise or bleed. Call your doctor or health care professional if you notice any unusual bleeding. Be careful brushing and flossing your teeth or using a toothpick because you may get an infection or bleed more easily. If you have any dental work done, tell your dentist you are receiving this medicine. Avoid taking products that contain aspirin, acetaminophen, ibuprofen, naproxen, or ketoprofen unless instructed by your doctor. These medicines may hide a fever. Do not become pregnant while taking this medicine. Women should inform their doctor if they wish to become pregnant or think they might be pregnant. There is a potential for serious side effects to an unborn child. Talk to your health care professional or pharmacist for more information. Do not breast-feed an infant while taking this medicine. Men are advised not to father a child while receiving this medicine. This product may contain alcohol. Ask your pharmacist or healthcare provider if this medicine contains alcohol. Be sure to tell all healthcare providers you are  taking this medicine. Certain medicines, like metronidazole and disulfiram, can cause an unpleasant reaction when taken with alcohol. The reaction includes flushing, headache, nausea, vomiting, sweating, and increased thirst. The reaction can last from 30 minutes to several hours. What side effects may I notice from receiving this medicine? Side effects that you should report to your doctor or health care professional as soon as possible: -allergic reactions like skin rash, itching or hives, swelling of the face, lips, or tongue -breathing problems -changes in vision -fast, irregular heartbeat -high or low blood pressure -mouth sores -pain, tingling, numbness in the hands or feet -signs of decreased platelets or bleeding - bruising, pinpoint red spots on the skin, black, tarry stools, blood in the urine -signs of decreased red blood cells - unusually weak or tired, feeling faint or lightheaded, falls -signs of infection - fever or chills, cough, sore throat, pain or difficulty passing urine -signs and symptoms of liver injury like dark yellow or brown urine; general ill feeling or flu-like symptoms; light-colored stools; loss of appetite; nausea; right upper belly pain; unusually weak or tired; yellowing of the eyes or skin -swelling of the ankles, feet, hands -unusually slow heartbeat Side effects that usually do not require medical attention (report to your doctor or health care professional if they continue or are bothersome): -diarrhea -hair loss -loss of appetite -muscle or joint pain -nausea, vomiting -pain, redness, or irritation at site where injected -tiredness This list may not describe all possible side effects. Call your doctor  for medical advice about side effects. You may report side effects to FDA at 1-800-FDA-1088. Where should I keep my medicine? This drug is given in a hospital or clinic and will not be stored at home. NOTE: This sheet is a summary. It may not cover all  possible information. If you have questions about this medicine, talk to your doctor, pharmacist, or health care provider.  2019 [employer] (2017-06-04 13:14:55)  Carboplatin injection What is this medicine? CARBOPLATIN (KAR boe pla tin) is a chemotherapy drug. It targets fast dividing cells, like cancer cells, and causes these cells to die. This medicine is used to treat ovarian cancer and many other cancers. This medicine may be used for other purposes; ask your health care provider or pharmacist if you have questions. COMMON BRAND NAME(S): Paraplatin What should I tell my health care provider before I take this medicine? They need to know if you have any of these conditions: -blood disorders -hearing problems -kidney disease -recent or ongoing radiation therapy -an unusual or allergic reaction to carboplatin, cisplatin, other chemotherapy, other medicines, foods, dyes, or preservatives -pregnant or trying to get pregnant -breast-feeding How should I use this medicine? This drug is usually given as an infusion into a vein. It is administered in a hospital or clinic by a specially trained health care professional. Talk to your pediatrician regarding the use of this medicine in children. Special care may be needed. Overdosage: If you think you have taken too much of this medicine contact a poison control center or emergency room at once. NOTE: This medicine is only for you. Do not share this medicine with others. What if I miss a dose? It is important not to miss a dose. Call your doctor or health care professional if you are unable to keep an appointment. What may interact with this medicine? -medicines for seizures -medicines to increase blood counts like filgrastim, pegfilgrastim, sargramostim -some antibiotics like amikacin, gentamicin, neomycin, streptomycin, tobramycin -vaccines Talk to your doctor or health care professional before taking any of these  medicines: -acetaminophen -aspirin -ibuprofen -ketoprofen -naproxen This list may not describe all possible interactions. Give your health care provider a list of all the medicines, herbs, non-prescription drugs, or dietary supplements you use. Also tell them if you smoke, drink alcohol, or use illegal drugs. Some items may interact with your medicine. What should I watch for while using this medicine? Your condition will be monitored carefully while you are receiving this medicine. You will need important blood work done while you are taking this medicine. This drug may make you feel generally unwell. This is not uncommon, as chemotherapy can affect healthy cells as well as cancer cells. Report any side effects. Continue your course of treatment even though you feel ill unless your doctor tells you to stop. In some cases, you may be given additional medicines to help with side effects. Follow all directions for their use. Call your doctor or health care professional for advice if you get a fever, chills or sore throat, or other symptoms of a cold or flu. Do not treat yourself. This drug decreases your body's ability to fight infections. Try to avoid being around people who are sick. This medicine may increase your risk to bruise or bleed. Call your doctor or health care professional if you notice any unusual bleeding. Be careful brushing and flossing your teeth or using a toothpick because you may get an infection or bleed more easily. If you have any dental work done,  tell your dentist you are receiving this medicine. Avoid taking products that contain aspirin, acetaminophen, ibuprofen, naproxen, or ketoprofen unless instructed by your doctor. These medicines may hide a fever. Do not become pregnant while taking this medicine. Women should inform their doctor if they wish to become pregnant or think they might be pregnant. There is a potential for serious side effects to an unborn child. Talk to  your health care professional or pharmacist for more information. Do not breast-feed an infant while taking this medicine. What side effects may I notice from receiving this medicine? Side effects that you should report to your doctor or health care professional as soon as possible: -allergic reactions like skin rash, itching or hives, swelling of the face, lips, or tongue -signs of infection - fever or chills, cough, sore throat, pain or difficulty passing urine -signs of decreased platelets or bleeding - bruising, pinpoint red spots on the skin, black, tarry stools, nosebleeds -signs of decreased red blood cells - unusually weak or tired, fainting spells, lightheadedness -breathing problems -changes in hearing -changes in vision -chest pain -high blood pressure -low blood counts - This drug may decrease the number of white blood cells, red blood cells and platelets. You may be at increased risk for infections and bleeding. -nausea and vomiting -pain, swelling, redness or irritation at the injection site -pain, tingling, numbness in the hands or feet -problems with balance, talking, walking -trouble passing urine or change in the amount of urine Side effects that usually do not require medical attention (report to your doctor or health care professional if they continue or are bothersome): -hair loss -loss of appetite -metallic taste in the mouth or changes in taste This list may not describe all possible side effects. Call your doctor for medical advice about side effects. You may report side effects to FDA at 1-800-FDA-1088. Where should I keep my medicine? This drug is given in a hospital or clinic and will not be stored at home. NOTE: This sheet is a summary. It may not cover all possible information. If you have questions about this medicine, talk to your doctor, pharmacist, or health care provider.  2019 [employer] Standard (2008-01-06 14:38:05)

## 2018-10-24 NOTE — Assessment & Plan Note (Signed)
She tolerated chemotherapy well except for some nausea, constipation and mild worsening neuropathy I recommend schedule laxative and antiemetics for the first 2 to 3 days after chemo I plan to reduce the dose of Taxol a little bit My plan is to give her 3 cycles of chemo before repeat CT imaging to reassess

## 2018-10-24 NOTE — Assessment & Plan Note (Signed)
She has mild worsening neuropathy I plan to reduce the dose of Taxol further

## 2018-10-24 NOTE — Progress Notes (Signed)
Pt is approved for the $1000 [employer] grant.  

## 2018-10-24 NOTE — Progress Notes (Signed)
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  She felt that she is benefiting from physical therapy I gave her prescription to see if her insurance will pay for walker  Chemotherapy-induced nausea She denies vomiting I recommend scheduled Compazine for 2 to 3 days after chemo  Other constipation We discussed aggressive laxative therapy   No orders of the defined types were placed in this encounter.   INTERVAL HISTORY: Please see below for problem oriented charting. She returns with her son, [name], to be seen prior to cycle 2 of chemotherapy She tolerated cycle 1 of treatment with some side effects She felt worsening peripheral neuropathy at the tips of fingers and toes She also have severe constipation lasting 4 to 5 days before it resolved with aggressive laxative She has some nausea for the first 2 days after treatment but denies vomiting.  She was able to hydrate herself adequately She started seeing physical therapy recently and felt that the rehab was helpful.  She was prescribed a walker She denies recent falls or injury No recent infection, fever or chills.  No cough.  SUMMARY OF ONCOLOGIC HISTORY:   Endometrial cancer ([place])   07/15/2018 Initial Diagnosis    She noted postmenopausal bleeding for the first time in  October 2019. A Pap was done 09/02/18 showign ASC-H. She was referred onto GYN and seen by Dr. Paula Compton     09/09/2018 Procedure    She underwent endometrial biopsy    09/12/2018 Pathology Results    Endometrial biopsy positive for adenocarcinoma    09/22/2018 Imaging    Ct abdomen and pelvis showed: Diffuse endometrial thickening, consistent with primary endometrial carcinoma.  Pelvic and abdominal retroperitoneal and retrocrural lymphadenopathy, consistent with metastatic disease.  Moderate hepatic steatosis.    10/01/2018 Procedure    Successful placement of a right IJ approach Power Port with ultrasound and fluoroscopic guidance. The catheter is ready for use.    10/02/2018 Tumor Marker    Patient's tumor was tested for the following markers: CA-125 Results of the tumor marker test revealed 578    10/03/2018 -  Chemotherapy    The patient had carboplatin and Taxol with dose adjustment due to neuropathy      REVIEW OF SYSTEMS:   Constitutional: Denies fevers, chills or abnormal weight loss Eyes: Denies blurriness of vision Ears, nose, mouth, throat, and face: Denies mucositis or sore throat Respiratory: Denies cough, dyspnea or wheezes Cardiovascular: Denies palpitation, chest discomfort or lower extremity swelling Skin: Denies abnormal skin rashes Lymphatics: Denies new lymphadenopathy or easy bruising Behavioral/Psych: Mood is stable, no new changes  All other systems were reviewed with the patient and are negative.  I have reviewed the past medical history, past surgical history, social history and family history with the patient and they are unchanged from previous note.  ALLERGIES:  has No Known Allergies.  MEDICATIONS:  Current Outpatient Medications  Medication Sig Dispense Refill  . dexamethasone (DECADRON) 4 MG tablet Take 5 tabs at the night before and 5 tab the morning of chemotherapy, every 3 weeks, by mouth 60 tablet 0  . diphenhydrAMINE  (SOMINEX) 25 MG tablet Take 100 mg by mouth 2 (two) times daily.     Marland Kitchen gabapentin (NEURONTIN) 400 MG capsule Take 800 mg by mouth 3 (three) times daily.   1  . hydrochlorothiazide (MICROZIDE) 12.5 MG capsule Take 12.5 mg by mouth daily.  0  . lidocaine-prilocaine (EMLA) cream Apply to affected area once 30 g 3  . loratadine (CLARITIN) 10 MG tablet Take 10 mg by mouth daily as needed for allergies.    Marland Kitchen losartan (COZAAR) 50 MG tablet Take 50 mg by mouth daily.  2  . ondansetron (ZOFRAN) 8 MG tablet Take 1 tablet (8 mg total) by mouth every 8 (eight) hours as needed for refractory nausea / vomiting. Start on day 3 after chemo. (Patient not taking: Reported on 10/16/2018) 30 tablet 1  . oxyCODONE-acetaminophen (PERCOCET) 10-325 MG per tablet Take 2 tablets by mouth every 8 (eight) hours as needed for pain.     Marland Kitchen prochlorperazine (COMPAZINE) 10 MG tablet Take 1 tablet (10 mg total) by mouth every 6 (six) hours as needed (Nausea or vomiting). 30 tablet 1  . zolpidem (AMBIEN) 5 MG tablet Take 1 tablet (5 mg total) by mouth at bedtime as needed for sleep. 30 tablet 0   No current facility-administered medications for this visit.    Facility-Administered Medications Ordered in Other Visits  Medication Dose Route Frequency Provider Last Rate Last Dose  . 0.9 %  sodium chloride infusion   Intravenous Once Icess Bertoni, MD      . CARBOplatin (PARAPLATIN) 750 mg in sodium chloride 0.9 % 250 mL chemo infusion  750 mg Intravenous Once Alvy Bimler, Kiyoshi Schaab, MD      . famotidine (PEPCID) IVPB 20 mg premix  20 mg Intravenous Once Alvy Bimler, Neziah Vogelgesang, MD      . fosaprepitant (EMEND) 150 mg, dexamethasone (DECADRON) 12 mg in sodium chloride 0.9 % 145 mL IVPB   Intravenous Once Alvy Bimler, Yarisbel Miranda, MD      . heparin lock flush 100 unit/mL  500 Units Intracatheter Once PRN Alvy Bimler, Hedda Crumbley, MD      . PACLitaxel (TAXOL) 258 mg in sodium chloride 0.9 % 250 mL chemo infusion (> 80mg /m2)  105 mg/m2 (Treatment Plan Recorded) Intravenous Once Alvy Bimler, Clancy Mullarkey,  MD      . palonosetron (ALOXI) injection 0.25 mg  0.25 mg Intravenous Once Camauri Fleece, MD      . sodium chloride flush (NS) 0.9 % injection 10 mL  10 mL Intracatheter PRN Alvy Bimler, Daryel Kenneth, MD        PHYSICAL EXAMINATION: ECOG PERFORMANCE STATUS: 2 - Symptomatic, <50% confined to bed  Vitals:   10/24/18 0817  BP: (!) 155/84  Pulse: 96  Resp: 18  Temp: 97.7 F (36.5 C)  SpO2: 98%   Filed Weights   10/24/18 0817  Weight: (!) 301 lb 6.4 oz (136.7 kg)    GENERAL:alert, no distress and comfortable SKIN: skin color, texture, turgor are normal, no rashes or significant lesions EYES: normal, Conjunctiva are pink and non-injected, sclera clear OROPHARYNX:no exudate, no erythema and lips, buccal mucosa, and tongue normal  NECK: supple, thyroid normal size, non-tender, without nodularity LYMPH:  no palpable lymphadenopathy in the cervical, axillary or inguinal LUNGS: clear to auscultation  and percussion with normal breathing effort HEART: regular rate & rhythm and no murmurs and no lower extremity edema ABDOMEN:abdomen soft, non-tender and normal bowel sounds Musculoskeletal:no cyanosis of digits and no clubbing  NEURO: alert & oriented x 3 with fluent speech, no focal motor/sensory deficits  LABORATORY DATA:  I have reviewed the data as listed    Component Value Date/Time   NA 141 10/24/2018 0803   K 4.1 10/24/2018 0803   CL 103 10/24/2018 0803   CO2 26 10/24/2018 0803   GLUCOSE 139 (H) 10/24/2018 0803   BUN 19 10/24/2018 0803   CREATININE 0.81 10/24/2018 0803   CALCIUM 9.6 10/24/2018 0803   PROT 8.1 10/24/2018 0803   ALBUMIN 4.1 10/24/2018 0803   AST 18 10/24/2018 0803   ALT 21 10/24/2018 0803   ALKPHOS 124 10/24/2018 0803   BILITOT 0.3 10/24/2018 0803   GFRNONAA >60 10/24/2018 0803   GFRAA >60 10/24/2018 0803    No results found for: SPEP, UPEP  Lab Results  Component Value Date   WBC 7.5 10/24/2018   NEUTROABS 6.0 10/24/2018   HGB 14.9 10/24/2018   HCT 44.7  10/24/2018   MCV 89.0 10/24/2018   PLT 210 10/24/2018      Chemistry      Component Value Date/Time   NA 141 10/24/2018 0803   K 4.1 10/24/2018 0803   CL 103 10/24/2018 0803   CO2 26 10/24/2018 0803   BUN 19 10/24/2018 0803   CREATININE 0.81 10/24/2018 0803      Component Value Date/Time   CALCIUM 9.6 10/24/2018 0803   ALKPHOS 124 10/24/2018 0803   AST 18 10/24/2018 0803   ALT 21 10/24/2018 0803   BILITOT 0.3 10/24/2018 0803       RADIOGRAPHIC STUDIES: I have personally reviewed the radiological images as listed and agreed with the findings in the report. Ct Chest W Contrast  Result Date: 10/03/2018 CLINICAL DATA:  Endometrial cancer. Retroperitoneal lymphadenopathy. Staging. EXAM: CT CHEST WITH CONTRAST TECHNIQUE: Multidetector CT imaging of the chest was performed during intravenous contrast administration. CONTRAST:  92mL OMNIPAQUE IOHEXOL 300 MG/ML  SOLN COMPARISON:  Abdominopelvic CT 09/20/2018. FINDINGS: Cardiovascular: Mild atherosclerosis of the aorta, great vessels and coronary arteries. No acute vascular findings. Right IJ Port-A-Cath extends to the superior cavoatrial junction. The heart size is normal. There is no pericardial effusion. Mediastinum/Nodes: There are no enlarged mediastinal, hilar or axillary lymph nodes.There are small mediastinal lymph nodes, including an 8 mm precarinal node on image 52/2 and a 9 mm distal paraesophageal node on image 68/2. The thyroid gland, trachea and esophagus demonstrate no significant findings. Lungs/Pleura: There is no pleural effusion. The lungs are essentially clear without suspicious pulmonary nodules. Upper abdomen: Stable cysts within the left hepatic lobe. Multiple mildly enlarged retroperitoneal and retrocrural lymph nodes are again noted. Largest is a posterior aortocaval node measuring 14 mm on image 107/2. Musculoskeletal/Chest wall: There is no chest wall mass or suspicious osseous finding. Mild thoracic spondylosis.  IMPRESSION: 1. No evidence of thoracic metastatic disease. 2. There are small mediastinal lymph nodes which are not pathologically enlarged. Upper abdominal lymphadenopathy again noted, similar to recent abdominal CT. 3.  Aortic Atherosclerosis (ICD10-I70.0). Electronically Signed   By: Richardean Sale M.D.   On: 10/03/2018 10:04   Ir Imaging Guided Port Insertion  Result Date: 10/01/2018 INDICATION: 68 year old female with uterine cancer. She presents for placement of a port catheter for chemotherapy. EXAM: IMPLANTED PORT A CATH PLACEMENT WITH ULTRASOUND AND FLUOROSCOPIC GUIDANCE MEDICATIONS:  Ancef 3 gm IV; The antibiotic was administered within an appropriate time interval prior to skin puncture. ANESTHESIA/SEDATION: Versed 2 mg IV; Fentanyl 75 mcg IV; Moderate Sedation Time:  19 minutes The patient was continuously monitored during the procedure by the interventional radiology nurse under my direct supervision. FLUOROSCOPY TIME:  0 minutes, 18 seconds (4 mGy) COMPLICATIONS: None immediate. PROCEDURE: The right neck and chest was prepped with chlorhexidine, and draped in the usual sterile fashion using maximum barrier technique (cap and mask, sterile gown, sterile gloves, large sterile sheet, hand hygiene and cutaneous antiseptic). Antibiotic prophylaxis was provided with 3g Ancef administered IV one hour prior to skin incision. Local anesthesia was attained by infiltration with 1% lidocaine with epinephrine. Ultrasound demonstrated patency of the right internal jugular vein, and this was documented with an image. Under real-time ultrasound guidance, this vein was accessed with a 21 gauge micropuncture needle and image documentation was performed. A small dermatotomy was made at the access site with an 11 scalpel. A 0.018" wire was advanced into the SVC and the access needle exchanged for a 73F micropuncture vascular sheath. The 0.018" wire was then removed and a 0.035" wire advanced into the IVC. An  appropriate location for the subcutaneous reservoir was selected below the clavicle and an incision was made through the skin and underlying soft tissues. The subcutaneous tissues were then dissected using a combination of blunt and sharp surgical technique and a pocket was formed. A single lumen power injectable portacatheter was then tunneled through the subcutaneous tissues from the pocket to the dermatotomy and the port reservoir placed within the subcutaneous pocket. The venous access site was then serially dilated and a peel away vascular sheath placed over the wire. The wire was removed and the port catheter advanced into position under fluoroscopic guidance. The catheter tip is positioned in the superior cavoatrial junction. This was documented with a spot image. The portacatheter was then tested and found to flush and aspirate well. The port was flushed with saline followed by 100 units/mL heparinized saline. The pocket was then closed in two layers using first subdermal inverted interrupted absorbable sutures followed by a running subcuticular suture. The epidermis was then sealed with Dermabond. The dermatotomy at the venous access site was also closed with a single inverted subdermal suture and the epidermis sealed with Dermabond. IMPRESSION: Successful placement of a right IJ approach Power Port with ultrasound and fluoroscopic guidance. The catheter is ready for use. Electronically Signed   By: Jacqulynn Cadet M.D.   On: 10/01/2018 11:17    All questions were answered. The patient knows to call the clinic with any problems, questions or concerns. No barriers to learning was detected.  I spent 25 minutes counseling the patient face to face. The total time spent in the appointment was 40 minutes and more than 50% was on counseling and review of test results  Heath Lark, MD 10/24/2018 9:48 AM

## 2018-10-24 NOTE — Assessment & Plan Note (Signed)
We discussed aggressive laxative therapy 

## 2018-10-24 NOTE — Assessment & Plan Note (Signed)
Her back pain is stable.  She felt that she is benefiting from physical therapy I gave her prescription to see if her insurance will pay for walker

## 2018-10-25 LAB — CA 125: Cancer Antigen (CA) 125: 296 U/mL — ABNORMAL HIGH (ref 0.0–38.1)

## 2018-10-27 ENCOUNTER — Telehealth: Payer: Self-pay

## 2018-10-27 NOTE — Telephone Encounter (Signed)
-----   Message from Heath Lark, MD sent at 10/27/2018  9:00 AM EST ----- Regarding: CA-125 Let her know the number dropped by half ----- Message ----- From: Interface, Lab In [place] Sent: 10/24/2018   8:11 AM EST To: Heath Lark, MD

## 2018-10-27 NOTE — Telephone Encounter (Signed)
Called and given below message. She verbalized understanding. 

## 2018-11-05 ENCOUNTER — Ambulatory Visit: Payer: Medicare Other

## 2018-11-05 DIAGNOSIS — G8929 Other chronic pain: Secondary | ICD-10-CM | POA: Diagnosis not present

## 2018-11-05 DIAGNOSIS — M6281 Muscle weakness (generalized): Secondary | ICD-10-CM

## 2018-11-05 DIAGNOSIS — M5441 Lumbago with sciatica, right side: Secondary | ICD-10-CM

## 2018-11-05 DIAGNOSIS — R262 Difficulty in walking, not elsewhere classified: Secondary | ICD-10-CM

## 2018-11-05 DIAGNOSIS — M5442 Lumbago with sciatica, left side: Secondary | ICD-10-CM

## 2018-11-05 NOTE — Therapy (Signed)
[place], [place], [address] Phone: [phone]   Fax:  [phone]  Physical Therapy Treatment  Patient Details  Name: Kathryn [name] MRN: [id number] Date of Birth: 03-Aug-1951 Referring Provider (PT): Dr. Alvy Bimler    Encounter Date: 11/05/2018  PT End of Session - 11/05/18 1044    Visit Number  3    Number of Visits  16   pt won't have regular visits due to chemo   Date for PT Re-Evaluation  12/15/18    PT Start Time  1015    PT Stop Time  1102    PT Time Calculation (min)  47 min    Activity Tolerance  Patient tolerated treatment well    Behavior During Therapy  [facility] for tasks assessed/performed       Past Medical History:  Diagnosis Date  . Arthritis   . Hypertension     Past Surgical History:  Procedure Laterality Date  . ANKLE SURGERY Right    right fusion  . BACK SURGERY     5 surgeries at [facility]; has screws and a "cage"  . CARPAL TUNNEL RELEASE Bilateral   . COLONOSCOPY    . IR IMAGING GUIDED PORT INSERTION  10/01/2018  . KNEE SURGERY Left   . LAPAROSCOPIC CHOLECYSTECTOMY    . LUMBAR LAMINECTOMY/DECOMPRESSION MICRODISCECTOMY Left 08/06/2013   Procedure: LUMBAR LAMINECTOMY/DECOMPRESSION MICRODISCECTOMY 1 LEVEL;  Surgeon: Ophelia Charter, MD;  Location: [place] [facility];  Service: Neurosurgery;  Laterality: Left;  redo LEFT Lumbar Five-Sacral One diskectomy  . PLACEMENT OF LUMBAR DRAIN N/A 08/07/2013   Procedure: PLACEMENT OF LUMBAR DRAIN;  Surgeon: Ophelia Charter, MD;  Location: [place];  Service: Neurosurgery;  Laterality: N/A;  . TONSILLECTOMY    . TUBAL LIGATION  1982    There were no vitals filed for this visit.  Subjective Assessment - 11/05/18 1023    Subjective  I was pretty sick after chemo all week last week. I had diarhhea alot of the week. So didn't get to exercise as much as I wanted to. Dr. Alvy Bimler was trying to prevent the constipation I had the first round of chemo  but I think she gave me too much medicine bc I couldn't stop having diarrhea. The only exercise that I couldn't tolerate was the seated marching, caused knee pain so did this in standing and that wasn't as painful.    Pertinent History  Endometrial cancer diagnosed September 01, 2018  First  chemotherapy Dec. 20  and will have 3 treatments , one every 3 weeks, then retesting and possibly surgery and then 3 more chemos. but that schedule may change  past history includes 6 back surgeries from L1 down starting in 1974 due to degenerative disc disease from the neck down She had an MRI in May of 2018 and was told there were so many areas of compression that it was not able to tell where leg pain was coming from.  She is followed by Dr. Koleen Nimrod at [facility] and she has epidurals but is not able have them anymore because there is no space for  the needle     Patient Stated Goals  She is hoping that she can get more strength in her legs. Pt lives in [place] and is hoping to learn what she needs to do at home     Currently in Pain?  No/denies  [place] [facility] PT Treatment/Exercise - 11/05/18 0001      Knee/Hip Exercises: Aerobic   Nustep  Level 4, x10 mins (legs only) PTA present throughout to monitor pts response to new activity Pt tolerated this well but ambulating to and from clinics was some fatiguing so seated rest after.      Knee/Hip Exercises: Standing   Functional Squat  5 reps   glut taps on wallin small, controlled range   Other Standing Knee Exercises  At back of bike for front to back weight shifting x30 sec each, then with arm swing (one arm as other was holding bike for balance) x30 sec each with pt returning therapist demonstration      Knee/Hip Exercises: Seated   Long Arc Quad  Strengthening;Right;Left;10 reps    Long Arc Quad Limitations  VCs to decrease posterior trunk lean    Ball Squeeze  Purple ball squeeze x20, 3-5 sec holds       Shoulder Exercises: Seated   Horizontal ABduction  Strengthening;Both;10 reps;Theraband    Theraband Level (Shoulder Horizontal ABduction)  Level 2 (Red)    Horizontal ABduction Limitations  Very challenging with red, encouraged her to cont with yellow at home for now but issued red for later use.    External Rotation  Strengthening;Both;10 reps;Theraband    Theraband Level (Shoulder External Rotation)  Level 2 (Red)    Flexion  AROM;Right;Left;10 reps    Flexion Limitations  Then with reaching across body and slight trunk rotation at end of motion with VCs to not feel any pain, 10x each               PT Short Term Goals - 10/16/18 1549      PT SHORT TERM GOAL #1   Title  Pt will report she is able to do a home exercise program and document her progress at home     Time  4    Period  Weeks    Status  New        PT Long Term Goals - 10/16/18 1550      PT LONG TERM GOAL #1   Title  Pt will decrease her time for Normal TUG to < 10 seconds indicating an improvment in balance and gait    Baseline  12.76    Time  8    Period  Weeks    Status  New      PT LONG TERM GOAL #2   Title  Pt with increase # of reps of sit to stand in 30 seconds to < 13 indicating and increase in general strength    Baseline  11    Time  8    Period  Weeks    Status  New      PT LONG TERM GOAL #3   Title  Pt will report she is able to climb steps with 25% less difficulty so that she can get in and out of the pool for water exercise     Time  8    Period  Weeks    Status  New            Plan - 11/05/18 1045    Clinical Impression Statement  Added NuStep today which pt tolerated very well. She reports only HEP she had pain with was seated marching, but she was able to do standing marching instead. So encouraged her to do more LAQ since she can't do seated marching. Reviewd her HEP issued  at last visit as she wasn't able to perform this as much as she wanted last week due to being fatigued  from chemo. Overall pt did very well with increased activity today and did not have increased pain with activities.     Rehab Potential  Good    Clinical Impairments Affecting Rehab Potential  limited back mobility and diffuse degernative disc disease , pt will not be able to come the week of chemo     PT Frequency  1x / week    PT Duration  8 weeks    PT Treatment/Interventions  ADLs/Self Care Home Management;DME Instruction;Gait training;Stair training;Functional mobility training;Cognitive remediation;Neuromuscular re-education;Therapeutic exercise;Therapeutic activities;Patient/family education;Energy conservation    PT Next Visit Plan  Cont with home exercise program that she can follow through at home and review current prn assessing technique    Consulted and Agree with Plan of Care  Patient       Patient will benefit from skilled therapeutic intervention in order to improve the following deficits and impairments:  Abnormal gait, Decreased knowledge of use of DME, Pain, Postural dysfunction, Increased muscle spasms, Decreased scar mobility, Decreased mobility, Decreased activity tolerance, Decreased endurance, Decreased strength, Impaired perceived functional ability, Obesity, Difficulty walking, Decreased balance, Decreased knowledge of precautions  Visit Diagnosis: Difficulty in walking  Muscle weakness (generalized)  Chronic bilateral low back pain with bilateral sciatica     Problem List Patient Active Problem List   Diagnosis Date Noted  . Chemotherapy-induced nausea 10/24/2018  . Other constipation 10/24/2018  . Insomnia due to medical condition 10/07/2018  . Chronic back pain greater than 3 months duration 09/26/2018  . Neuropathy due to medical condition ([place]) 09/26/2018  . Physical debility 09/26/2018  . Morbid (severe) obesity due to excess calories ([place]) 09/26/2018  . Endometrial cancer ([place]) 09/24/2018  . Adenopathy 09/24/2018    Otelia Limes,  PTA 11/05/2018, 12:45 PM  [place], [place], [address] Phone: [phone]   Fax:  [phone]  Name: Kathryn [name] MRN: [id number] Date of Birth: Mar 06, 1951

## 2018-11-12 ENCOUNTER — Ambulatory Visit: Payer: Medicare Other

## 2018-11-14 ENCOUNTER — Inpatient Hospital Stay: Payer: Medicare Other

## 2018-11-14 ENCOUNTER — Encounter: Payer: Self-pay | Admitting: Hematology and Oncology

## 2018-11-14 ENCOUNTER — Inpatient Hospital Stay (HOSPITAL_BASED_OUTPATIENT_CLINIC_OR_DEPARTMENT_OTHER): Payer: Medicare Other | Admitting: Hematology and Oncology

## 2018-11-14 ENCOUNTER — Telehealth: Payer: Self-pay | Admitting: Hematology and Oncology

## 2018-11-14 VITALS — BP 169/82 | HR 86 | Temp 98.1°F | Resp 18 | Ht 63.0 in | Wt 302.6 lb

## 2018-11-14 DIAGNOSIS — C541 Malignant neoplasm of endometrium: Secondary | ICD-10-CM | POA: Diagnosis not present

## 2018-11-14 DIAGNOSIS — Z5111 Encounter for antineoplastic chemotherapy: Secondary | ICD-10-CM | POA: Diagnosis not present

## 2018-11-14 DIAGNOSIS — R739 Hyperglycemia, unspecified: Secondary | ICD-10-CM | POA: Diagnosis not present

## 2018-11-14 DIAGNOSIS — K5909 Other constipation: Secondary | ICD-10-CM | POA: Diagnosis not present

## 2018-11-14 DIAGNOSIS — G8929 Other chronic pain: Secondary | ICD-10-CM | POA: Diagnosis not present

## 2018-11-14 DIAGNOSIS — R110 Nausea: Secondary | ICD-10-CM | POA: Diagnosis not present

## 2018-11-14 DIAGNOSIS — M549 Dorsalgia, unspecified: Secondary | ICD-10-CM | POA: Diagnosis not present

## 2018-11-14 DIAGNOSIS — G63 Polyneuropathy in diseases classified elsewhere: Secondary | ICD-10-CM

## 2018-11-14 DIAGNOSIS — T380X5A Adverse effect of glucocorticoids and synthetic analogues, initial encounter: Secondary | ICD-10-CM | POA: Insufficient documentation

## 2018-11-14 LAB — CBC WITH DIFFERENTIAL (CANCER CENTER ONLY)
Abs Immature Granulocytes: 0.02 10*3/uL (ref 0.00–0.07)
BASOS PCT: 0 %
Basophils Absolute: 0 10*3/uL (ref 0.0–0.1)
Eosinophils Absolute: 0 10*3/uL (ref 0.0–0.5)
Eosinophils Relative: 0 %
HCT: 41.1 % (ref 36.0–46.0)
Hemoglobin: 13.8 g/dL (ref 12.0–15.0)
Immature Granulocytes: 0 %
Lymphocytes Relative: 16 %
Lymphs Abs: 1 10*3/uL (ref 0.7–4.0)
MCH: 30.2 pg (ref 26.0–34.0)
MCHC: 33.6 g/dL (ref 30.0–36.0)
MCV: 89.9 fL (ref 80.0–100.0)
Monocytes Absolute: 0.2 10*3/uL (ref 0.1–1.0)
Monocytes Relative: 4 %
Neutro Abs: 4.9 10*3/uL (ref 1.7–7.7)
Neutrophils Relative %: 80 %
Platelet Count: 218 10*3/uL (ref 150–400)
RBC: 4.57 MIL/uL (ref 3.87–5.11)
RDW: 16.2 % — ABNORMAL HIGH (ref 11.5–15.5)
WBC Count: 6.1 10*3/uL (ref 4.0–10.5)
nRBC: 0 % (ref 0.0–0.2)

## 2018-11-14 LAB — CMP (CANCER CENTER ONLY)
ALT: 16 U/L (ref 0–44)
AST: 14 U/L — ABNORMAL LOW (ref 15–41)
Albumin: 3.8 g/dL (ref 3.5–5.0)
Alkaline Phosphatase: 113 U/L (ref 38–126)
Anion gap: 12 (ref 5–15)
BUN: 15 mg/dL (ref 8–23)
CO2: 26 mmol/L (ref 22–32)
Calcium: 9.5 mg/dL (ref 8.9–10.3)
Chloride: 103 mmol/L (ref 98–111)
Creatinine: 0.77 mg/dL (ref 0.44–1.00)
GFR, Estimated: >60 mL/min (ref >60)
Glucose, Bld: 155 mg/dL — ABNORMAL HIGH (ref 70–99)
Potassium: 3.6 mmol/L (ref 3.5–5.1)
Sodium: 141 mmol/L (ref 135–145)
Total Bilirubin: 0.3 mg/dL (ref 0.3–1.2)
Total Protein: 7.6 g/dL (ref 6.5–8.1)

## 2018-11-14 MED ORDER — SODIUM CHLORIDE 0.9 % IV SOLN
105.0000 mg/m2 | Freq: Once | INTRAVENOUS | Status: AC
  Administered 2018-11-14: 258 mg via INTRAVENOUS
  Filled 2018-11-14: qty 43

## 2018-11-14 MED ORDER — SODIUM CHLORIDE 0.9 % IV SOLN
Freq: Once | INTRAVENOUS | Status: AC
  Administered 2018-11-14: 10:00:00 via INTRAVENOUS
  Filled 2018-11-14: qty 250

## 2018-11-14 MED ORDER — PALONOSETRON HCL INJECTION 0.25 MG/5ML
INTRAVENOUS | Status: AC
  Filled 2018-11-14: qty 5

## 2018-11-14 MED ORDER — HEPARIN SOD (PORK) LOCK FLUSH 100 UNIT/ML IV SOLN
500.0000 [IU] | Freq: Once | INTRAVENOUS | Status: AC | PRN
  Administered 2018-11-14: 500 [IU]
  Filled 2018-11-14: qty 5

## 2018-11-14 MED ORDER — SODIUM CHLORIDE 0.9 % IV SOLN
Freq: Once | INTRAVENOUS | Status: AC
  Administered 2018-11-14: 10:00:00 via INTRAVENOUS
  Filled 2018-11-14: qty 5

## 2018-11-14 MED ORDER — FAMOTIDINE IN NACL 20-0.9 MG/50ML-% IV SOLN
20.0000 mg | Freq: Once | INTRAVENOUS | Status: AC
  Administered 2018-11-14: 20 mg via INTRAVENOUS

## 2018-11-14 MED ORDER — PALONOSETRON HCL INJECTION 0.25 MG/5ML
0.2500 mg | Freq: Once | INTRAVENOUS | Status: AC
  Administered 2018-11-14: 0.25 mg via INTRAVENOUS

## 2018-11-14 MED ORDER — DEXAMETHASONE 4 MG PO TABS: ORAL_TABLET | ORAL | 0 refills | Status: DC

## 2018-11-14 MED ORDER — SODIUM CHLORIDE 0.9% FLUSH
10.0000 mL | INTRAVENOUS | Status: DC | PRN
  Administered 2018-11-14: 10 mL
  Filled 2018-11-14: qty 10

## 2018-11-14 MED ORDER — FAMOTIDINE IN NACL 20-0.9 MG/50ML-% IV SOLN
INTRAVENOUS | Status: AC
  Filled 2018-11-14: qty 50

## 2018-11-14 MED ORDER — SODIUM CHLORIDE 0.9 % IV SOLN
750.0000 mg | Freq: Once | INTRAVENOUS | Status: AC
  Administered 2018-11-14: 750 mg via INTRAVENOUS
  Filled 2018-11-14: qty 75

## 2018-11-14 NOTE — Telephone Encounter (Signed)
Gave avs and calendar ° °

## 2018-11-14 NOTE — Progress Notes (Signed)
Pt. Confirmed taking benadryl 100mg  at home, prior to treatment today.

## 2018-11-14 NOTE — Assessment & Plan Note (Signed)
She denies worsening peripheral neuropathy since recent dose reduction We will continue with prior reduced dose Taxol 

## 2018-11-14 NOTE — Assessment & Plan Note (Signed)
Her constipation has resolved I recommend discontinuation of laxatives

## 2018-11-14 NOTE — Assessment & Plan Note (Signed)
Clinically, she had excellent response of therapy. I recommend reducing oral premedication dexamethasone in the future due to unpleasant side effects I plan to repeat CT imaging in 2 weeks with appointment to see surgeon to see if interval debulking surgery is feasible at that time.

## 2018-11-14 NOTE — Patient Instructions (Signed)
[facility] Discharge Instructions for Patients Receiving Chemotherapy  Today you received the following chemotherapy agents Taxol and Carboplatin  To help prevent nausea and vomiting after your treatment, we encourage you to take your nausea medication as prescribed by MD. **DO NOT TAKE ZOFRAN FOR 3 DAYS AFTER TREATMENT**   If you develop nausea and vomiting that is not controlled by your nausea medication, call the clinic.   BELOW ARE SYMPTOMS THAT SHOULD BE REPORTED IMMEDIATELY:  *FEVER GREATER THAN 100.5 F  *CHILLS WITH OR WITHOUT FEVER  NAUSEA AND VOMITING THAT IS NOT CONTROLLED WITH YOUR NAUSEA MEDICATION  *UNUSUAL SHORTNESS OF BREATH  *UNUSUAL BRUISING OR BLEEDING  TENDERNESS IN MOUTH AND THROAT WITH OR WITHOUT PRESENCE OF ULCERS  *URINARY PROBLEMS  *BOWEL PROBLEMS  UNUSUAL RASH Items with * indicate a potential emergency and should be followed up as soon as possible.  Feel free to call the clinic should you have any questions or concerns. The clinic phone number is [phone].  Please show the [facility] at check-in to the Emergency Department and triage nurse.  Paclitaxel injection What is this medicine? PACLITAXEL (PAK li TAX el) is a chemotherapy drug. It targets fast dividing cells, like cancer cells, and causes these cells to die. This medicine is used to treat ovarian cancer, breast cancer, lung cancer, Kaposi's sarcoma, and other cancers. This medicine may be used for other purposes; ask your health care provider or pharmacist if you have questions. COMMON BRAND NAME(S): Onxol, Taxol What should I tell my health care provider before I take this medicine? They need to know if you have any of these conditions: -history of irregular heartbeat -liver disease -low blood counts, like low white cell, platelet, or red cell counts -lung or breathing disease, like asthma -tingling of the fingers or toes, or other nerve disorder -an unusual  or allergic reaction to paclitaxel, alcohol, polyoxyethylated castor oil, other chemotherapy, other medicines, foods, dyes, or preservatives -pregnant or trying to get pregnant -breast-feeding How should I use this medicine? This drug is given as an infusion into a vein. It is administered in a hospital or clinic by a specially trained health care professional. Talk to your pediatrician regarding the use of this medicine in children. Special care may be needed. Overdosage: If you think you have taken too much of this medicine contact a poison control center or emergency room at once. NOTE: This medicine is only for you. Do not share this medicine with others. What if I miss a dose? It is important not to miss your dose. Call your doctor or health care professional if you are unable to keep an appointment. What may interact with this medicine? Do not take this medicine with any of the following medications: -disulfiram -metronidazole This medicine may also interact with the following medications: -antiviral medicines for hepatitis, HIV or AIDS -certain antibiotics like erythromycin and clarithromycin -certain medicines for fungal infections like ketoconazole and itraconazole -certain medicines for seizures like carbamazepine, phenobarbital, phenytoin -gemfibrozil -nefazodone -rifampin -St. John's wort This list may not describe all possible interactions. Give your health care provider a list of all the medicines, herbs, non-prescription drugs, or dietary supplements you use. Also tell them if you smoke, drink alcohol, or use illegal drugs. Some items may interact with your medicine. What should I watch for while using this medicine? Your condition will be monitored carefully while you are receiving this medicine. You will need important blood work done while you are taking  this medicine. This medicine can cause serious allergic reactions. To reduce your risk you will need to take other  medicine(s) before treatment with this medicine. If you experience allergic reactions like skin rash, itching or hives, swelling of the face, lips, or tongue, tell your doctor or health care professional right away. In some cases, you may be given additional medicines to help with side effects. Follow all directions for their use. This drug may make you feel generally unwell. This is not uncommon, as chemotherapy can affect healthy cells as well as cancer cells. Report any side effects. Continue your course of treatment even though you feel ill unless your doctor tells you to stop. Call your doctor or health care professional for advice if you get a fever, chills or sore throat, or other symptoms of a cold or flu. Do not treat yourself. This drug decreases your body's ability to fight infections. Try to avoid being around people who are sick. This medicine may increase your risk to bruise or bleed. Call your doctor or health care professional if you notice any unusual bleeding. Be careful brushing and flossing your teeth or using a toothpick because you may get an infection or bleed more easily. If you have any dental work done, tell your dentist you are receiving this medicine. Avoid taking products that contain aspirin, acetaminophen, ibuprofen, naproxen, or ketoprofen unless instructed by your doctor. These medicines may hide a fever. Do not become pregnant while taking this medicine. Women should inform their doctor if they wish to become pregnant or think they might be pregnant. There is a potential for serious side effects to an unborn child. Talk to your health care professional or pharmacist for more information. Do not breast-feed an infant while taking this medicine. Men are advised not to father a child while receiving this medicine. This product may contain alcohol. Ask your pharmacist or healthcare provider if this medicine contains alcohol. Be sure to tell all healthcare providers you are  taking this medicine. Certain medicines, like metronidazole and disulfiram, can cause an unpleasant reaction when taken with alcohol. The reaction includes flushing, headache, nausea, vomiting, sweating, and increased thirst. The reaction can last from 30 minutes to several hours. What side effects may I notice from receiving this medicine? Side effects that you should report to your doctor or health care professional as soon as possible: -allergic reactions like skin rash, itching or hives, swelling of the face, lips, or tongue -breathing problems -changes in vision -fast, irregular heartbeat -high or low blood pressure -mouth sores -pain, tingling, numbness in the hands or feet -signs of decreased platelets or bleeding - bruising, pinpoint red spots on the skin, black, tarry stools, blood in the urine -signs of decreased red blood cells - unusually weak or tired, feeling faint or lightheaded, falls -signs of infection - fever or chills, cough, sore throat, pain or difficulty passing urine -signs and symptoms of liver injury like dark yellow or brown urine; general ill feeling or flu-like symptoms; light-colored stools; loss of appetite; nausea; right upper belly pain; unusually weak or tired; yellowing of the eyes or skin -swelling of the ankles, feet, hands -unusually slow heartbeat Side effects that usually do not require medical attention (report to your doctor or health care professional if they continue or are bothersome): -diarrhea -hair loss -loss of appetite -muscle or joint pain -nausea, vomiting -pain, redness, or irritation at site where injected -tiredness This list may not describe all possible side effects. Call your doctor  for medical advice about side effects. You may report side effects to FDA at 1-800-FDA-1088. Where should I keep my medicine? This drug is given in a hospital or clinic and will not be stored at home. NOTE: This sheet is a summary. It may not cover all  possible information. If you have questions about this medicine, talk to your doctor, pharmacist, or health care provider.  2019 Elsevier/Gold Standard (2017-06-04 13:14:55)  Carboplatin injection What is this medicine? CARBOPLATIN (KAR boe pla tin) is a chemotherapy drug. It targets fast dividing cells, like cancer cells, and causes these cells to die. This medicine is used to treat ovarian cancer and many other cancers. This medicine may be used for other purposes; ask your health care provider or pharmacist if you have questions. COMMON BRAND NAME(S): Paraplatin What should I tell my health care provider before I take this medicine? They need to know if you have any of these conditions: -blood disorders -hearing problems -kidney disease -recent or ongoing radiation therapy -an unusual or allergic reaction to carboplatin, cisplatin, other chemotherapy, other medicines, foods, dyes, or preservatives -pregnant or trying to get pregnant -breast-feeding How should I use this medicine? This drug is usually given as an infusion into a vein. It is administered in a hospital or clinic by a specially trained health care professional. Talk to your pediatrician regarding the use of this medicine in children. Special care may be needed. Overdosage: If you think you have taken too much of this medicine contact a poison control center or emergency room at once. NOTE: This medicine is only for you. Do not share this medicine with others. What if I miss a dose? It is important not to miss a dose. Call your doctor or health care professional if you are unable to keep an appointment. What may interact with this medicine? -medicines for seizures -medicines to increase blood counts like filgrastim, pegfilgrastim, sargramostim -some antibiotics like amikacin, gentamicin, neomycin, streptomycin, tobramycin -vaccines Talk to your doctor or health care professional before taking any of these  medicines: -acetaminophen -aspirin -ibuprofen -ketoprofen -naproxen This list may not describe all possible interactions. Give your health care provider a list of all the medicines, herbs, non-prescription drugs, or dietary supplements you use. Also tell them if you smoke, drink alcohol, or use illegal drugs. Some items may interact with your medicine. What should I watch for while using this medicine? Your condition will be monitored carefully while you are receiving this medicine. You will need important blood work done while you are taking this medicine. This drug may make you feel generally unwell. This is not uncommon, as chemotherapy can affect healthy cells as well as cancer cells. Report any side effects. Continue your course of treatment even though you feel ill unless your doctor tells you to stop. In some cases, you may be given additional medicines to help with side effects. Follow all directions for their use. Call your doctor or health care professional for advice if you get a fever, chills or sore throat, or other symptoms of a cold or flu. Do not treat yourself. This drug decreases your body's ability to fight infections. Try to avoid being around people who are sick. This medicine may increase your risk to bruise or bleed. Call your doctor or health care professional if you notice any unusual bleeding. Be careful brushing and flossing your teeth or using a toothpick because you may get an infection or bleed more easily. If you have any dental work done,  tell your dentist you are receiving this medicine. Avoid taking products that contain aspirin, acetaminophen, ibuprofen, naproxen, or ketoprofen unless instructed by your doctor. These medicines may hide a fever. Do not become pregnant while taking this medicine. Women should inform their doctor if they wish to become pregnant or think they might be pregnant. There is a potential for serious side effects to an unborn child. Talk to  your health care professional or pharmacist for more information. Do not breast-feed an infant while taking this medicine. What side effects may I notice from receiving this medicine? Side effects that you should report to your doctor or health care professional as soon as possible: -allergic reactions like skin rash, itching or hives, swelling of the face, lips, or tongue -signs of infection - fever or chills, cough, sore throat, pain or difficulty passing urine -signs of decreased platelets or bleeding - bruising, pinpoint red spots on the skin, black, tarry stools, nosebleeds -signs of decreased red blood cells - unusually weak or tired, fainting spells, lightheadedness -breathing problems -changes in hearing -changes in vision -chest pain -high blood pressure -low blood counts - This drug may decrease the number of white blood cells, red blood cells and platelets. You may be at increased risk for infections and bleeding. -nausea and vomiting -pain, swelling, redness or irritation at the injection site -pain, tingling, numbness in the hands or feet -problems with balance, talking, walking -trouble passing urine or change in the amount of urine Side effects that usually do not require medical attention (report to your doctor or health care professional if they continue or are bothersome): -hair loss -loss of appetite -metallic taste in the mouth or changes in taste This list may not describe all possible side effects. Call your doctor for medical advice about side effects. You may report side effects to FDA at 1-800-FDA-1088. Where should I keep my medicine? This drug is given in a hospital or clinic and will not be stored at home. NOTE: This sheet is a summary. It may not cover all possible information. If you have questions about this medicine, talk to your doctor, pharmacist, or health care provider.  2019 Elsevier/Gold Standard (2008-01-06 14:38:05)

## 2018-11-14 NOTE — Patient Instructions (Signed)

## 2018-11-14 NOTE — Progress Notes (Signed)
[facility] OFFICE PROGRESS NOTE  Patient Care Team: Ronita Hipps, MD as PCP - General (Family Medicine) Nicholaus Bloom, MD (Anesthesiology)  ASSESSMENT & PLAN:  Endometrial cancer [facility]) Clinically, she had excellent response of therapy. I recommend reducing oral premedication dexamethasone in the future due to unpleasant side effects I plan to repeat CT imaging in 2 weeks with appointment to see surgeon to see if interval debulking surgery is feasible at that time.  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 CT ABDOMEN PELVIS W CONTRAST    Standing Status:   Future    Standing Expiration Date:   11/15/2019    Order Specific Question:   If indicated for the ordered procedure, I authorize the administration of contrast media per Radiology protocol    Answer:   Yes    Order Specific Question:   Preferred imaging location?    Answer:   [facility]    Order Specific Question:   Radiology Contrast Protocol - do NOT remove file path    Answer:   [email or web address]    INTERVAL HISTORY: Please see below for problem oriented charting. She returns with her son for cycle 3 of chemotherapy Since last time I saw her, she actually had frequent loose bowel movement and has discontinued her laxative Her neuropathy is not worse She denies recent infection, fever or chills Her son has noted significant improvement since she began physical therapy She denies significant nausea from treatment  SUMMARY OF ONCOLOGIC HISTORY: Oncology History   MSI -  Stable MMR - Normal Endometrioid     Endometrial cancer ([place])   07/15/2018 Initial Diagnosis    She noted postmenopausal bleeding for the first time in October 2019. A Pap was done 09/02/18 showign [facility]. She was referred onto GYN and seen by Dr. Paula Compton     09/09/2018 Procedure    She underwent endometrial biopsy    09/12/2018 Pathology Results    Endometrial biopsy positive for adenocarcinoma    09/22/2018 Imaging    Ct abdomen and pelvis showed: Diffuse endometrial thickening, consistent with primary endometrial carcinoma.  Pelvic and abdominal retroperitoneal and retrocrural lymphadenopathy, consistent with metastatic disease.  Moderate hepatic steatosis.    10/01/2018 Procedure    Successful placement of a right IJ approach Power Port with ultrasound and fluoroscopic guidance. The catheter is ready for use.    10/02/2018 Tumor Marker    Patient's tumor was tested for the following markers: CA-125 Results of the tumor marker test revealed 578    10/03/2018 -  Chemotherapy    The patient had carboplatin and Taxol with dose adjustment due to neuropathy     10/24/2018 Tumor Marker    Patient's tumor was tested for the following markers: CA-125 Results of the tumor marker test revealed 296     Genetic Testing    Patient has genetic testing done for MSI/MMR. Results revealed MSI stable and MMR normal on Accession [id number].     REVIEW OF SYSTEMS:   Constitutional: Denies fevers, chills or abnormal weight loss Eyes: Denies blurriness of vision Ears, nose, mouth, throat, and face: Denies mucositis or sore throat Respiratory: Denies cough, dyspnea or  wheezes Cardiovascular: Denies palpitation, chest discomfort or lower extremity swelling Skin: Denies abnormal skin rashes Lymphatics: Denies new lymphadenopathy or easy bruising Behavioral/Psych: Mood is stable, no new changes  All other systems were reviewed with the patient and are negative.  I have reviewed  the past medical history, past surgical history, social history and family history with the patient and they are unchanged from previous note.  ALLERGIES:  has No Known Allergies.  MEDICATIONS:  Current Outpatient Medications  Medication Sig Dispense Refill  . dexamethasone (DECADRON) 4 MG tablet Take 3 tabs at the night before and 3 tab the morning of chemotherapy, every 3 weeks, by mouth 60 tablet 0  . diphenhydrAMINE (SOMINEX) 25 MG tablet Take 100 mg by mouth 2 (two) times daily.     Marland Kitchen gabapentin (NEURONTIN) 400 MG capsule Take 800 mg by mouth 3 (three) times daily.   1  . hydrochlorothiazide (MICROZIDE) 12.5 MG capsule Take 12.5 mg by mouth daily.  0  . lidocaine-prilocaine (EMLA) cream Apply to affected area once 30 g 3  . loratadine (CLARITIN) 10 MG tablet Take 10 mg by mouth daily as needed for allergies.    Marland Kitchen losartan (COZAAR) 50 MG tablet Take 50 mg by mouth daily.  2  . ondansetron (ZOFRAN) 8 MG tablet Take 1 tablet (8 mg total) by mouth every 8 (eight) hours as needed for refractory nausea / vomiting. Start on day 3 after chemo. (Patient not taking: Reported on 10/16/2018) 30 tablet 1  . oxyCODONE-acetaminophen (PERCOCET) 10-325 MG per tablet Take 2 tablets by mouth every 8 (eight) hours as needed for pain.     Marland Kitchen prochlorperazine (COMPAZINE) 10 MG tablet Take 1 tablet (10 mg total) by mouth every 6 (six) hours as needed (Nausea or vomiting). 30 tablet 1  . zolpidem (AMBIEN) 5 MG tablet Take 1 tablet (5 mg total) by mouth at bedtime as needed for sleep. 30 tablet 0   No current facility-administered medications for this visit.    Facility-Administered Medications Ordered in Other Visits  Medication Dose Route Frequency Provider Last Rate Last Dose  . CARBOplatin (PARAPLATIN) 750 mg in sodium chloride 0.9 % 250 mL chemo infusion  750 mg Intravenous Once Heath Lark, MD 650 mL/hr at 11/14/18 1422 750 mg at 11/14/18 1422  . heparin lock flush 100 unit/mL  500 Units Intracatheter Once  PRN Alvy Bimler, Claiborne Stroble, MD      . sodium chloride flush (NS) 0.9 % injection 10 mL  10 mL Intracatheter PRN Alvy Bimler, Ayah Cozzolino, MD        PHYSICAL EXAMINATION: ECOG PERFORMANCE STATUS: 2 - Symptomatic, <50% confined to bed  Vitals:   11/14/18 0908  BP: (!) 169/82  Pulse: 86  Resp: 18  Temp: 98.1 F (36.7 C)  SpO2: 98%   Filed Weights   11/14/18 0908  Weight: (!) 302 lb 9.6 oz (137.3 kg)    GENERAL:alert, no distress and comfortable SKIN: skin color, texture, turgor are normal, no rashes or significant lesions EYES: normal, Conjunctiva are pink and non-injected, sclera clear OROPHARYNX:no exudate, no erythema and lips, buccal mucosa, and tongue normal  NECK: supple, thyroid normal size, non-tender, without nodularity LYMPH:  no palpable lymphadenopathy in the cervical, axillary or inguinal LUNGS: clear to auscultation and percussion with normal breathing effort HEART: regular rate & rhythm and no murmurs and no lower extremity edema ABDOMEN:abdomen soft, non-tender and normal bowel sounds Musculoskeletal:no cyanosis of digits and no clubbing  NEURO: alert & oriented x 3 with fluent speech,  no focal motor/sensory deficits  LABORATORY DATA:  I have reviewed the data as listed    Component Value Date/Time   NA 141 11/14/2018 0835   K 3.6 11/14/2018 0835   CL 103 11/14/2018 0835   CO2 26 11/14/2018 0835   GLUCOSE 155 (H) 11/14/2018 0835   BUN 15 11/14/2018 0835   CREATININE 0.77 11/14/2018 0835   CALCIUM 9.5 11/14/2018 0835   PROT 7.6 11/14/2018 0835   ALBUMIN 3.8 11/14/2018 0835   AST 14 (L) 11/14/2018 0835   ALT 16 11/14/2018 0835   ALKPHOS 113 11/14/2018 0835   BILITOT 0.3 11/14/2018 0835   GFRNONAA >60 11/14/2018 0835   GFRAA >60 11/14/2018 0835    No results found for: SPEP, UPEP  Lab Results  Component Value Date   WBC 6.1 11/14/2018   NEUTROABS 4.9 11/14/2018   HGB 13.8 11/14/2018   HCT 41.1 11/14/2018   MCV 89.9 11/14/2018   PLT 218 11/14/2018      Chemistry       Component Value Date/Time   NA 141 11/14/2018 0835   K 3.6 11/14/2018 0835   CL 103 11/14/2018 0835   CO2 26 11/14/2018 0835   BUN 15 11/14/2018 0835   CREATININE 0.77 11/14/2018 0835      Component Value Date/Time   CALCIUM 9.5 11/14/2018 0835   ALKPHOS 113 11/14/2018 0835   AST 14 (L) 11/14/2018 0835   ALT 16 11/14/2018 0835   BILITOT 0.3 11/14/2018 0835      All questions were answered. The patient knows to call the clinic with any problems, questions or concerns. No barriers to learning was detected.  I spent 25 minutes counseling the patient face to face. The total time spent in the appointment was 30 minutes and more than 50% was on counseling and review of test results  Heath Lark, MD 11/14/2018 2:26 PM

## 2018-11-14 NOTE — Assessment & Plan Note (Signed)
She has mild steroid-induced hyperglycemia I recommend reducing oral premedication dexamethasone in the future

## 2018-11-15 LAB — CA 125: CANCER ANTIGEN (CA) 125: 81 U/mL — AB (ref 0.0–38.1)

## 2018-11-17 ENCOUNTER — Telehealth: Payer: Self-pay | Admitting: Oncology

## 2018-11-17 ENCOUNTER — Telehealth: Payer: Self-pay

## 2018-11-17 NOTE — Telephone Encounter (Signed)
Called [facility] and advised of CT scan appointment on 11/27/18 at 8:30 with 8:15 arrival (NPO 4 hours before, drink 1st bottle of contrast at 6:30, 2nd at 7:30).  She verbalized agreement and will pick up the contrast on 11/26/18.

## 2018-11-17 NOTE — Telephone Encounter (Signed)
Kathryn [name] and informed her of appointment with Dr. Denman George on 11/28/18 at 11 am.  She verbalized agreement.

## 2018-11-17 NOTE — Telephone Encounter (Signed)
Called and given below message. She verbalized understanding. She had nausea over the weekend. Vomited last night. Taking compazine prn. She is able to drink and eat. Instructed her to take Zofran starting today and push fluids. Instructed to call the office back for worsening symptoms. She verbalized understanding.

## 2018-11-17 NOTE — Telephone Encounter (Signed)
-----   Message from Heath Lark, MD sent at 11/17/2018  8:42 AM EST ----- Regarding: CA-125 Let her know it is better ----- Message ----- From: Interface, Lab In [place] Sent: 11/14/2018   8:55 AM EST To: Heath Lark, MD

## 2018-11-26 ENCOUNTER — Ambulatory Visit: Payer: Medicare Other | Attending: Hematology and Oncology

## 2018-11-26 DIAGNOSIS — R262 Difficulty in walking, not elsewhere classified: Secondary | ICD-10-CM

## 2018-11-26 DIAGNOSIS — M6281 Muscle weakness (generalized): Secondary | ICD-10-CM | POA: Diagnosis not present

## 2018-11-26 DIAGNOSIS — G8929 Other chronic pain: Secondary | ICD-10-CM | POA: Diagnosis not present

## 2018-11-26 DIAGNOSIS — M5442 Lumbago with sciatica, left side: Secondary | ICD-10-CM | POA: Insufficient documentation

## 2018-11-26 DIAGNOSIS — M5441 Lumbago with sciatica, right side: Secondary | ICD-10-CM | POA: Insufficient documentation

## 2018-11-26 NOTE — Patient Instructions (Signed)
Weight shifting in varying positions (side to side, front to neutral, diagonol to neutral) incorporating arm swings as able holding counter top less and less.   Heel Raise: Bilateral (Standing)   Cancer Rehab [phone]    Stand near counter for fingertip support if needed. Rise on balls of feet. Repeat __10-20__ times per set. Do _1-2___ sets per session. Do __2__ sessions per day.  SINGLE LIMB STANCE    Stand at counter (corner if you have one) for minimal arm support. Raise leg. Hold _20-30__ seconds. Repeat with other leg. Once this becomes easier, for increased challenge, close eyes with fingertips on counter for safety.   _3-5__ reps per set, _2-3__ sets per day.  Tandem Stance    Stand at counter (corner if you have one). Right foot in front of left, heel touching toe both feet "straight ahead". Stand on Foot Triangle of Support with both feet. Balance in this position _20-30__ seconds. Do with left foot in front of right. Once this becomes easier, for increased challenge, close eyes with fingertips on counter for safety.

## 2018-11-26 NOTE — Therapy (Signed)
[place], [place], [address] Phone: [phone]   Fax:  [phone]  Physical Therapy Treatment  Patient Details  Name: Kathryn [name] MRN: [id number] Date of Birth: 07-Jan-1951 Referring Provider (PT): Dr. Alvy Bimler    Encounter Date: 11/26/2018  PT End of Session - 11/26/18 1105    Visit Number  4    Number of Visits  16   pt won't have regular visits due to chemo   Date for PT Re-Evaluation  12/15/18    PT Start Time  1020    PT Stop Time  1104    PT Time Calculation (min)  44 min    Activity Tolerance  Patient tolerated treatment well    Behavior During Therapy  [facility] for tasks assessed/performed       Past Medical History:  Diagnosis Date  . Arthritis   . Hypertension     Past Surgical History:  Procedure Laterality Date  . ANKLE SURGERY Right    right fusion  . BACK SURGERY     5 surgeries at [facility]; has screws and a "cage"  . CARPAL TUNNEL RELEASE Bilateral   . COLONOSCOPY    . IR IMAGING GUIDED PORT INSERTION  10/01/2018  . KNEE SURGERY Left   . LAPAROSCOPIC CHOLECYSTECTOMY    . LUMBAR LAMINECTOMY/DECOMPRESSION MICRODISCECTOMY Left 08/06/2013   Procedure: LUMBAR LAMINECTOMY/DECOMPRESSION MICRODISCECTOMY 1 LEVEL;  Surgeon: Ophelia Charter, MD;  Location: [place] [facility] [facility];  Service: Neurosurgery;  Laterality: Left;  redo LEFT Lumbar Five-Sacral One diskectomy  . PLACEMENT OF LUMBAR DRAIN N/A 08/07/2013   Procedure: PLACEMENT OF LUMBAR DRAIN;  Surgeon: Ophelia Charter, MD;  Location: [facility];  Service: Neurosurgery;  Laterality: N/A;  . TONSILLECTOMY    . TUBAL LIGATION  1982    There were no vitals filed for this visit.  Subjective Assessment - 11/26/18 1028    Subjective  I've been doing the HEP but I have hda to change positions of some. Like the sitting ones I'm doing in standing bc my nerve pain was getting worse. I'm walking more around the house every time I go to the bathroom.  Overall I'd say the bil leg and LBP is better, it's just not as good yet as it was before cancer.     Pertinent History  Endometrial cancer diagnosed September 01, 2018  First  chemotherapy Dec. 20  and will have 3 treatments , one every 3 weeks, then retesting and possibly surgery and then 3 more chemos. but that schedule may change  past history includes 6 back surgeries from L1 down starting in 1974 due to degenerative disc disease from the neck down She had an MRI in May of 2018 and was told there were so many areas of compression that it was not able to tell where leg pain was coming from.  She is followed by Dr. Koleen Nimrod at [facility] and she has epidurals but is not able have them anymore because there is no space for  the needle     Currently in Pain?  Yes    Pain Score  6     Pain Location  Leg    Pain Orientation  Left    Pain Descriptors / Indicators  Shooting;Sharp    Pain Type  Neuropathic pain    Pain Onset  1 to 4 weeks ago    Aggravating Factors   prolonged sitting    Pain Relieving Factors  exercising and  just trying to stay active in general                       [facility] Adult PT Treatment/Exercise - 11/26/18 0001      Neuro Re-ed    Neuro Re-ed Details   Issued HEP for following having pt return brief demo , but her standing tolerance is limited: tandem stance, varying positions of weight shifting with arm swings and how she can lessen HHA on counter top; heel raises, and discussed ways to practice SLS. She did not do this today as her Lt leg was beginning to bother her. She reports unable to FWB for SLS on Rt, so instead showed her how she can shift as much weight to Rt as possible while keeping Lt foot on florr holding on counter.       Knee/Hip Exercises: Aerobic   Nustep  Level 5, x 10:30 mins monitoring pt throughout and discussing HEP and ways she has been changing positions when her nerve pain limits her, like in sitting. Required brief seated rest  break after before ambulating back to Cancer Rehab side, but other than that tolerated this very well.              PT Education - 11/26/18 1102    Education Details  Reviewed current HEP answering pts questions and suggesting other ways to progress and encourage that changing positions is good as she is looking for ways to make exs work. Progressed HEP to add balance exs.    Methods  Explanation;Demonstration;Handout    Comprehension  Verbalized understanding;Returned demonstration;Need further instruction       PT Short Term Goals - 10/16/18 1549      PT SHORT TERM GOAL #1   Title  Pt will report she is able to do a home exercise program and document her progress at home     Time  4    Period  Weeks    Status  New        PT Long Term Goals - 10/16/18 1550      PT LONG TERM GOAL #1   Title  Pt will decrease her time for Normal TUG to < 10 seconds indicating an improvment in balance and gait    Baseline  12.76    Time  8    Period  Weeks    Status  New      PT LONG TERM GOAL #2   Title  Pt with increase # of reps of sit to stand in 30 seconds to < 13 indicating and increase in general strength    Baseline  11    Time  8    Period  Weeks    Status  New      PT LONG TERM GOAL #3   Title  Pt will report she is able to climb steps with 25% less difficulty so that she can get in and out of the pool for water exercise     Time  8    Period  Weeks    Status  New            Plan - 11/26/18 1213    Clinical Impression Statement  Continued with NuStep today and progressed resistance which overall pt tolerated well. During htis time discussed her current HEP and ways she has been working on modifying positions to work around her Lt LE nerve pain. Pt is working very hard to be compliant  with her exercises and walking more around the house. Incoporated balance exs to her HEP today and she was very encouraged by furhter discussion of ways to modify current and new HEP prn.  Pt is progressing very well towards goal of independent with HEP at this time and overall reports noticing some improvement in her bil LE and LBP.    Rehab Potential  Good    Clinical Impairments Affecting Rehab Potential  limited back mobility and diffuse degernative disc disease , pt will not be able to come the week of chemo     PT Frequency  1x / week    PT Duration  8 weeks    PT Treatment/Interventions  ADLs/Self Care Home Management;DME Instruction;Gait training;Stair training;Functional mobility training;Cognitive remediation;Neuromuscular re-education;Therapeutic exercise;Therapeutic activities;Patient/family education;Energy conservation    PT Next Visit Plan  Retest TUG and 30 sec sit to stand for goal assess next. Cont with home exercise program for balance and general strength that she can follow through at home and review current prn assessing technique    Consulted and Agree with Plan of Care  Patient       Patient will benefit from skilled therapeutic intervention in order to improve the following deficits and impairments:  Abnormal gait, Decreased knowledge of use of DME, Pain, Postural dysfunction, Increased muscle spasms, Decreased scar mobility, Decreased mobility, Decreased activity tolerance, Decreased endurance, Decreased strength, Impaired perceived functional ability, Obesity, Difficulty walking, Decreased balance, Decreased knowledge of precautions  Visit Diagnosis: Difficulty in walking  Muscle weakness (generalized)  Chronic bilateral low back pain with bilateral sciatica     Problem List Patient Active Problem List   Diagnosis Date Noted  . Steroid-induced hyperglycemia 11/14/2018  . Chemotherapy-induced nausea 10/24/2018  . Other constipation 10/24/2018  . Insomnia due to medical condition 10/07/2018  . Chronic back pain greater than 3 months duration 09/26/2018  . Neuropathy due to medical condition ([place]) 09/26/2018  . Physical debility 09/26/2018  .  Morbid (severe) obesity due to excess calories ([place]) 09/26/2018  . Endometrial cancer ([place]) 09/24/2018  . Adenopathy 09/24/2018    Otelia Limes, PTA 11/26/2018, 12:19 PM  [place], [place], [address] Phone: [phone]   Fax:  [phone]  Name: Kathryn [name] MRN: [id number] Date of Birth: [birth year]

## 2018-11-27 ENCOUNTER — Ambulatory Visit (HOSPITAL_COMMUNITY)
Admission: RE | Admit: 2018-11-27 | Discharge: 2018-11-27 | Disposition: A | Discharge status: Home / Self Care | Payer: Medicare Other | Source: Ambulatory Visit | Attending: Hematology and Oncology | Admitting: Hematology and Oncology

## 2018-11-27 DIAGNOSIS — C541 Malignant neoplasm of endometrium: Secondary | ICD-10-CM | POA: Insufficient documentation

## 2018-11-27 MED ORDER — SODIUM CHLORIDE (PF) 0.9 % IJ SOLN
INTRAMUSCULAR | Status: AC
  Filled 2018-11-27: qty 50

## 2018-11-27 MED ORDER — IOHEXOL 300 MG/ML  SOLN
100.0000 mL | Freq: Once | INTRAMUSCULAR | Status: AC | PRN
  Administered 2018-11-27: 100 mL via INTRAVENOUS

## 2018-11-27 MED ORDER — HEPARIN SOD (PORK) LOCK FLUSH 100 UNIT/ML IV SOLN
INTRAVENOUS | Status: AC
  Filled 2018-11-27: qty 5

## 2018-11-27 MED ORDER — HEPARIN SOD (PORK) LOCK FLUSH 100 UNIT/ML IV SOLN
500.0000 [IU] | Freq: Once | INTRAVENOUS | Status: AC
  Administered 2018-11-27: 500 [IU] via INTRAVENOUS

## 2018-11-28 ENCOUNTER — Inpatient Hospital Stay: Payer: Medicare Other | Attending: Hematology and Oncology | Admitting: Gynecologic Oncology

## 2018-11-28 ENCOUNTER — Encounter: Payer: Self-pay | Admitting: Gynecologic Oncology

## 2018-11-28 VITALS — BP 163/82 | HR 72 | Temp 98.3°F | Resp 20 | Ht 63.0 in | Wt 303.2 lb

## 2018-11-28 DIAGNOSIS — C541 Malignant neoplasm of endometrium: Secondary | ICD-10-CM | POA: Diagnosis not present

## 2018-11-28 DIAGNOSIS — C778 Secondary and unspecified malignant neoplasm of lymph nodes of multiple regions: Secondary | ICD-10-CM | POA: Diagnosis not present

## 2018-11-28 NOTE — Progress Notes (Signed)
Follow-up   Consult was requested by Dr. Paula Compton for Endometrial Cancer   Chief Complaint  Patient presents with  . Endometrial cancer ([place])     Assessment  Stage IIIC2 endometrial cancer s/p 3 cycles neoadjuvant chemotherapy with 3 cycles of carboplatin and paclitaxel. Good, though incomplete response.  Deconditioning and weakness.   Plan   Continue 3 additional cycles of chemotherapy to total of 6.  Continue physical therapy for deconditioning,  I will see her back after 6cycles. CT scan abd/pelvis and chest to evaluate for response. If there has been complete response at distant sites, would recommend minimally invasive hysterectomy, BSO to reduce risk of local recurrence and help with pelvic control. Would then consider if there is a role for radiation (unlikely given that distant relapse is likely to be the most significant risk for her).   HPI: Ms. Kathryn [name]  is a very nice 68 y.o.  P2  She noted postmenopausal bleeding for the first time in October 2019. This then was heavy in November and at that time she followed up with her PCP. A Pap was done 09/02/18 showign ASC-H. She was referred onto GYN and seen by Dr. Paula Compton who performed a colposcopy, ECC, and EMB.  She had early menopause at age 68. She has not been sexually active since her 45's. She claims to have been regular with her Pap smears and states prior to the current workup her last Pap was 2 years ago. She states she has never before had an abnormal Pap.  The EMB and ECC returned with adenocarcinoma, favoring endometrial primary however + (strongly) for p16 and ER (patchy).  09/22/18 CT scan as noted in detail below in summary -- Diffuse endometrial thickening, consistent with primary endometrial carcinoma. Pelvic and abdominal retroperitoneal and retrocrural lymphadenopathy, consistent with metastatic disease.  CT chest on 10/03/18 negative for pulmonary mets/  Interval Hx:  She  received neoadjuvant chemotherapy with Dr. Rosey Bath such commencing on October 03, 2018.  She received carboplatin and paclitaxel.  At the time of diagnosis Ca1 25 was elevated to 578.  After cycle 2 this had reduced to 81.  She underwent tumor testing which revealed an MSI stable MMR normal tumor.  After 3 cycles of chemotherapy she received CT scan of the abdomen and pelvis on February13th 2020.  This revealed significant decrease in abdominal retroperitoneal and bilateral iliac lymphadenopathy since the previous study.  The index lymph node in the aortocaval space now measured 1 cm compared with 1.7 cm previously.  Similarly bilateral iliac lymphadenopathy had decreased in size from 2.3 to 1 cm.  There were no new lesions identified or progressive disease seen.  There is a significant decrease in the abnormal endometrial soft tissue seen.  She has been receiving physical therapy and patient and her family report that this was associated with improved performance status.  She is no longer experiencing vaginal bleeding. Imported EPIC Oncologic History:  Oncology History   MSI - Stable MMR - Normal Endometrioid     Endometrial cancer ([place])   07/15/2018 Initial Diagnosis    She noted postmenopausal bleeding for the first time in October 2019. A Pap was done 09/02/18 showign ASC-H. She was referred onto GYN and seen by Dr. Paula Compton     09/09/2018 Procedure    She underwent endometrial biopsy    09/12/2018 Pathology Results    Endometrial biopsy positive for adenocarcinoma    09/22/2018 Imaging    Ct abdomen and pelvis  showed: Diffuse endometrial thickening, consistent with primary endometrial carcinoma.  Pelvic and abdominal retroperitoneal and retrocrural lymphadenopathy, consistent with metastatic disease.  Moderate hepatic steatosis.    10/01/2018 Procedure    Successful placement of a right IJ approach Power Port with ultrasound and fluoroscopic guidance. The catheter is  ready for use.    10/02/2018 Tumor Marker    Patient's tumor was tested for the following markers: CA-125 Results of the tumor marker test revealed 578    10/03/2018 -  Chemotherapy    The patient had carboplatin and Taxol with dose adjustment due to neuropathy     10/24/2018 Tumor Marker    Patient's tumor was tested for the following markers: CA-125 Results of the tumor marker test revealed 296     Genetic Testing    Patient has genetic testing done for MSI/MMR. Results revealed MSI stable and MMR normal on Accession [id number].    11/14/2018 Tumor Marker    Patient's tumor was tested for the following markers: CA-125 Results of the tumor marker test revealed 81    11/27/2018 Imaging    1. Significant decrease in abdominal retroperitoneal and bilateral iliac lymphadenopathy since previous study. 2. Significant decrease in abnormal endometrial soft tissue density since prior study. 3. No new or progressive metastatic disease identified.     Measurement of disease: . TBD  Radiology: Ct Chest W Contrast  Result Date: 10/03/2018 CLINICAL DATA:  Endometrial cancer. Retroperitoneal lymphadenopathy. Staging. EXAM: CT CHEST WITH CONTRAST TECHNIQUE: Multidetector CT imaging of the chest was performed during intravenous contrast administration. CONTRAST:  20m OMNIPAQUE IOHEXOL 300 MG/ML  SOLN COMPARISON:  Abdominopelvic CT 09/20/2018. FINDINGS: Cardiovascular: Mild atherosclerosis of the aorta, great vessels and coronary arteries. No acute vascular findings. Right IJ Port-A-Cath extends to the superior cavoatrial junction. The heart size is normal. There is no pericardial effusion. Mediastinum/Nodes: There are no enlarged mediastinal, hilar or axillary lymph nodes.There are small mediastinal lymph nodes, including an 8 mm precarinal node on image 52/2 and a 9 mm distal paraesophageal node on image 68/2. The thyroid gland, trachea and esophagus demonstrate no significant findings.  Lungs/Pleura: There is no pleural effusion. The lungs are essentially clear without suspicious pulmonary nodules. Upper abdomen: Stable cysts within the left hepatic lobe. Multiple mildly enlarged retroperitoneal and retrocrural lymph nodes are again noted. Largest is a posterior aortocaval node measuring 14 mm on image 107/2. Musculoskeletal/Chest wall: There is no chest wall mass or suspicious osseous finding. Mild thoracic spondylosis. IMPRESSION: 1. No evidence of thoracic metastatic disease. 2. There are small mediastinal lymph nodes which are not pathologically enlarged. Upper abdominal lymphadenopathy again noted, similar to recent abdominal CT. 3.  Aortic Atherosclerosis (ICD10-I70.0). Electronically Signed   By: WRichardean SaleM.D.   On: 10/03/2018 10:04   Ct Abdomen Pelvis W Contrast  Result Date: 11/27/2018 CLINICAL DATA:  Followup endometrial carcinoma. Undergoing chemotherapy. EXAM: CT ABDOMEN AND PELVIS WITH CONTRAST TECHNIQUE: Multidetector CT imaging of the abdomen and pelvis was performed using the standard protocol following bolus administration of intravenous contrast. CONTRAST:  1074mOMNIPAQUE IOHEXOL 300 MG/ML  SOLN COMPARISON:  09/20/2018 FINDINGS: Lower Chest: No acute findings. Hepatobiliary: No hepatic masses identified. Decreased hepatic steatosis noted. A few small left hepatic lobe cysts are stable. No liver masses identified. Prior cholecystectomy. No evidence of biliary obstruction. Pancreas:  No mass or inflammatory changes. Spleen: Within normal limits in size and appearance. Adrenals/Urinary Tract: No masses identified. No evidence of hydronephrosis. Stomach/Bowel: No evidence of obstruction, inflammatory process or abnormal  fluid collections. Diverticulosis is seen mainly involving the sigmoid colon, however there is no evidence of diverticulitis. Vascular/Lymphatic: Abdominal retroperitoneal lymphadenopathy is significantly decreased since previous study. Index lymph node in  aortocaval space currently measures 10 mm on image 36/2 compared to 17 mm previously. Decreased bilateral iliac lymphadenopathy is also demonstrated, with index lymph node in left common iliac chain measuring 10 mm on image 55/2 compared to 2.3 cm previously. No new or increased lymphadenopathy identified. No evidence of abdominal aortic aneurysm. Aortic atherosclerosis. Reproductive: Uterus is unremarkable in appearance with significant decrease in abnormal endometrial soft tissue density since prior study. Adnexal regions are unremarkable. Other:  None. Musculoskeletal:  No suspicious bone lesions identified. IMPRESSION: 1. Significant decrease in abdominal retroperitoneal and bilateral iliac lymphadenopathy since previous study. 2. Significant decrease in abnormal endometrial soft tissue density since prior study. 3. No new or progressive metastatic disease identified. Electronically Signed   By: Earle Gell M.D.   On: 11/27/2018 10:10   Ct Abdomen Pelvis W Contrast  Result Date: 09/22/2018 CLINICAL DATA:  Newly diagnosed endometrial carcinoma.  Staging. Creatinine was obtained on site at [place] at [address] Results: Creatinine 0.7 mg/dL. EXAM: CT ABDOMEN AND PELVIS WITH CONTRAST TECHNIQUE: Multidetector CT imaging of the abdomen and pelvis was performed using the standard protocol following bolus administration of intravenous contrast. CONTRAST:  140m ISOVUE-300 IOPAMIDOL (ISOVUE-300) INJECTION 61% COMPARISON:  None. FINDINGS: Lower Chest: No acute findings. Hepatobiliary: No hepatic masses identified. Two small cysts are seen in the left hepatic lobe. Moderate diffuse hepatic steatosis noted. Prior cholecystectomy. No evidence of biliary obstruction. Pancreas:  No mass or inflammatory changes. Spleen: Within normal limits in size and appearance. Adrenals/Urinary Tract: No masses identified. No evidence of hydronephrosis. Unremarkable unopacified urinary bladder. Stomach/Bowel: No  evidence of obstruction, inflammatory process or abnormal fluid collections. Mild diverticulosis is seen involving the descending and sigmoid colon, without evidence of diverticulitis. Vascular/Lymphatic: Pelvic lymphadenopathy is seen in the left common and external iliac chains, with largest lymph node in the left common iliac region measuring 2.5 cm on image 57/2. Mild right external iliac lymphadenopathy is seen with largest lymph node measuring 1.8 cm on image 62/2. Retroperitoneal lymphadenopathy is seen throughout the aortocaval and left paraaortic spaces, with largest lymph node measuring 1.7 cm on image 39/2. A 10 mm right retrocaval lymph node is also seen. No abdominal aortic aneurysm. Reproductive: Diffuse endometrial thickening is seen measuring approximately 25 mm, consistent with known endometrial carcinoma. No evidence of extra uterine extension. Adnexal regions are unremarkable. Other:  None. Musculoskeletal:  No suspicious bone lesions identified. IMPRESSION: Diffuse endometrial thickening, consistent with primary endometrial carcinoma. Pelvic and abdominal retroperitoneal and retrocrural lymphadenopathy, consistent with metastatic disease. Moderate hepatic steatosis. Electronically Signed   By: JEarle GellM.D.   On: 09/22/2018 08:48   [employer] Uni Right Inc Axilla  Result Date: 09/22/2018 CLINICAL DATA:  Screening recall for possible right breast mass. EXAM: DIGITAL DIAGNOSTIC UNILATERAL RIGHT MAMMOGRAM WITH CAD AND TOMO RIGHT BREAST ULTRASOUND COMPARISON:  Previous exam(s). ACR Breast Density Category b: There are scattered areas of fibroglandular density. FINDINGS: Additional tomograms were performed of the right breast. The initially questioned possible right breast mass is less apparent appears low density with possible internal fat suggestive of a benign intramammary lymph node. Mammographic images were processed with CAD. Targeted ultrasound of the entire outer right breast was  performed. There is an intramammary lymph node in the right at 9 o'clock 4 cm from  nipple measuring 0.7 x 0.3 x 0.6 cm. This is felt to correspond well with the mass seen in the right breast at mammography. No suspicious abnormality seen in the outer right breast. IMPRESSION: No findings of malignancy in the right breast. RECOMMENDATION: Recommend annual routine screening mammography, due November 2020. I have discussed the findings and recommendations with the patient. Results were also provided in writing at the conclusion of the visit. If applicable, a reminder letter will be sent to the patient regarding the next appointment. BI-RADS CATEGORY  2: Benign. Electronically Signed   By: Everlean Alstrom M.D.   On: 09/22/2018 10:11   Mm Diag Breast Tomo Uni Right  Result Date: 09/22/2018 CLINICAL DATA:  Screening recall for possible right breast mass. EXAM: DIGITAL DIAGNOSTIC UNILATERAL RIGHT MAMMOGRAM WITH CAD AND TOMO RIGHT BREAST ULTRASOUND COMPARISON:  Previous exam(s). ACR Breast Density Category b: There are scattered areas of fibroglandular density. FINDINGS: Additional tomograms were performed of the right breast. The initially questioned possible right breast mass is less apparent appears low density with possible internal fat suggestive of a benign intramammary lymph node. Mammographic images were processed with CAD. Targeted ultrasound of the entire outer right breast was performed. There is an intramammary lymph node in the right at 9 o'clock 4 cm from nipple measuring 0.7 x 0.3 x 0.6 cm. This is felt to correspond well with the mass seen in the right breast at mammography. No suspicious abnormality seen in the outer right breast. IMPRESSION: No findings of malignancy in the right breast. RECOMMENDATION: Recommend annual routine screening mammography, due November 2020. I have discussed the findings and recommendations with the patient. Results were also provided in writing at the conclusion of the  visit. If applicable, a reminder letter will be sent to the patient regarding the next appointment. BI-RADS CATEGORY  2: Benign. Electronically Signed   By: Everlean Alstrom M.D.   On: 09/22/2018 10:11   Ir Imaging Guided Port Insertion  Result Date: 10/01/2018 INDICATION: 68 year old female with uterine cancer. She presents for placement of a port catheter for chemotherapy. EXAM: IMPLANTED PORT A CATH PLACEMENT WITH ULTRASOUND AND FLUOROSCOPIC GUIDANCE MEDICATIONS: Ancef 3 gm IV; The antibiotic was administered within an appropriate time interval prior to skin puncture. ANESTHESIA/SEDATION: Versed 2 mg IV; Fentanyl 75 mcg IV; Moderate Sedation Time:  19 minutes The patient was continuously monitored during the procedure by the interventional radiology nurse under my direct supervision. FLUOROSCOPY TIME:  0 minutes, 18 seconds (4 mGy) COMPLICATIONS: None immediate. PROCEDURE: The right neck and chest was prepped with chlorhexidine, and draped in the usual sterile fashion using maximum barrier technique (cap and mask, sterile gown, sterile gloves, large sterile sheet, hand hygiene and cutaneous antiseptic). Antibiotic prophylaxis was provided with 3g Ancef administered IV one hour prior to skin incision. Local anesthesia was attained by infiltration with 1% lidocaine with epinephrine. Ultrasound demonstrated patency of the right internal jugular vein, and this was documented with an image. Under real-time ultrasound guidance, this vein was accessed with a 21 gauge micropuncture needle and image documentation was performed. A small dermatotomy was made at the access site with an 11 scalpel. A 0.018" wire was advanced into the SVC and the access needle exchanged for a 62F micropuncture vascular sheath. The 0.018" wire was then removed and a 0.035" wire advanced into the IVC. An appropriate location for the subcutaneous reservoir was selected below the clavicle and an incision was made through the skin and  underlying soft tissues. The subcutaneous  tissues were then dissected using a combination of blunt and sharp surgical technique and a pocket was formed. A single lumen power injectable portacatheter was then tunneled through the subcutaneous tissues from the pocket to the dermatotomy and the port reservoir placed within the subcutaneous pocket. The venous access site was then serially dilated and a peel away vascular sheath placed over the wire. The wire was removed and the port catheter advanced into position under fluoroscopic guidance. The catheter tip is positioned in the superior cavoatrial junction. This was documented with a spot image. The portacatheter was then tested and found to flush and aspirate well. The port was flushed with saline followed by 100 units/mL heparinized saline. The pocket was then closed in two layers using first subdermal inverted interrupted absorbable sutures followed by a running subcuticular suture. The epidermis was then sealed with Dermabond. The dermatotomy at the venous access site was also closed with a single inverted subdermal suture and the epidermis sealed with Dermabond. IMPRESSION: Successful placement of a right IJ approach Power Port with ultrasound and fluoroscopic guidance. The catheter is ready for use. Electronically Signed   By: Jacqulynn Cadet M.D.   On: 10/01/2018 11:17   .   Outpatient Encounter Medications as of 11/28/2018  Medication Sig  . dexamethasone (DECADRON) 4 MG tablet Take 3 tabs at the night before and 3 tab the morning of chemotherapy, every 3 weeks, by mouth  . diphenhydrAMINE (SOMINEX) 25 MG tablet Take 100 mg by mouth 2 (two) times daily.   Marland Kitchen gabapentin (NEURONTIN) 400 MG capsule Take 800 mg by mouth 3 (three) times daily.   . hydrochlorothiazide (MICROZIDE) 12.5 MG capsule Take 12.5 mg by mouth daily.  Marland Kitchen lidocaine-prilocaine (EMLA) cream Apply to affected area once  . loratadine (CLARITIN) 10 MG tablet Take 10 mg by mouth daily as  needed for allergies.  Marland Kitchen losartan (COZAAR) 50 MG tablet Take 50 mg by mouth daily.  . ondansetron (ZOFRAN) 8 MG tablet Take 1 tablet (8 mg total) by mouth every 8 (eight) hours as needed for refractory nausea / vomiting. Start on day 3 after chemo.  Marland Kitchen oxyCODONE-acetaminophen (PERCOCET) 10-325 MG per tablet Take 2 tablets by mouth every 8 (eight) hours as needed for pain.   Marland Kitchen prochlorperazine (COMPAZINE) 10 MG tablet Take 1 tablet (10 mg total) by mouth every 6 (six) hours as needed (Nausea or vomiting).  . zolpidem (AMBIEN) 5 MG tablet Take 1 tablet (5 mg total) by mouth at bedtime as needed for sleep.   No facility-administered encounter medications on file as of 11/28/2018.    No Known Allergies  Past Medical History:  Diagnosis Date  . Arthritis   . Hypertension    Past Surgical History:  Procedure Laterality Date  . ANKLE SURGERY Right    right fusion  . BACK SURGERY     5 surgeries [facility]; has screws and a "cage"  . CARPAL TUNNEL RELEASE Bilateral   . COLONOSCOPY    . IR IMAGING GUIDED PORT INSERTION  10/01/2018  . KNEE SURGERY Left   . LAPAROSCOPIC CHOLECYSTECTOMY    . LUMBAR LAMINECTOMY/DECOMPRESSION MICRODISCECTOMY Left 08/06/2013   Procedure: LUMBAR LAMINECTOMY/DECOMPRESSION MICRODISCECTOMY 1 LEVEL;  Surgeon: Ophelia Charter, MD;  Location: [facility];  Service: Neurosurgery;  Laterality: Left;  redo LEFT Lumbar Five-Sacral One diskectomy  . PLACEMENT OF LUMBAR DRAIN N/A 08/07/2013   Procedure: PLACEMENT OF LUMBAR DRAIN;  Surgeon: Ophelia Charter, MD;  Location: [facility];  Service: Neurosurgery;  Laterality: N/A;  . TONSILLECTOMY    . TUBAL LIGATION  1982        Past Gynecological History:   GYNECOLOGIC HISTORY:  . No LMP recorded. Patient is postmenopausal. 68 yo . Menarche: 68 years old . P 2 . Contraceptive none . HRT none  . Last Pap referral and then states piror to this was 2 years ago; always normal Family Hx:  Family History  Problem Relation Age of  Onset  . Alzheimer's disease Mother   . Colon cancer Father 4  . Heart Problems Father   . Diabetes Maternal Aunt    Social Hx:  Marland Kitchen Tobacco use: none . Alcohol use: holidays . Illicit Drug use: none . Illicit IV Drug use: none    Review of Systems: Review of Systems  Musculoskeletal: Positive for arthralgias and back pain.  All other systems reviewed and are negative.   Vitals:  Vitals:   11/28/18 1120 11/28/18 1122  BP: (!) 163/82   Pulse: 72   Resp:  20  Temp: 98.3 F (36.8 C)   SpO2: 98%    Vitals:   11/28/18 1120  Weight: (!) 303 lb 3.2 oz (137.5 kg)  Height: '5\' 3"'  (1.6 m)   Body mass index is 53.71 kg/m.  Physical Exam: General :  Obese, Well developed, 68 y.o., female in no apparent distress HEENT:  Normocephalic/atraumatic, symmetric, EOMI, eyelids normal Neck:   Supple, no masses.  Lymphatics:  No cervical/ submandibular/ supraclavicular/ infraclavicular/ inguinal adenopathy Respiratory:  Respirations unlabored, no use of accessory muscles CV:   Deferred Breast:  Deferred Musculoskeletal: No CVA tenderness, normal muscle strength. Abdomen:  Obese, Soft, non-tender and nondistended. No evidence of hernia. No masses. Extremities:  Obese. No lymphedema, no erythema, non-tender. Skin:   Normal inspection Neuro/Psych:  No focal motor deficit, no abnormal mental status. Normal gait. Normal affect. Alert and oriented to person, place, and time  Genito Urinary: Vulva: Normal external female genitalia.  Bladder/urethra: Urethral meatus normal in size and location. No lesions or   masses, well supported bladder Speculum exam: Vagina: No lesion, no discharge, no bleeding.  Cervix: grossly normal, smooth, not enlarged. Uterus mobile.   Adnexal region: No masses. Rectovaginal:  Good tone, no masses, no cul de sac nodularity, no parametrial involvement or nodularity.   Cc: Paula Compton, MD (Referring Ob/Gyn) Ronita Hipps, MD  (PCP)

## 2018-11-28 NOTE — Patient Instructions (Signed)
Please follow-up with Dr Alvy Bimler for your chemotherapy next week as scheduled.  Dr Denman George will see you back after your sixth dose (in April, 2020) to discuss surgery which will be scheduled for the end of April or first part of May.

## 2018-12-03 ENCOUNTER — Telehealth: Payer: Self-pay | Admitting: Oncology

## 2018-12-03 ENCOUNTER — Ambulatory Visit: Payer: Medicare Other

## 2018-12-03 DIAGNOSIS — M5442 Lumbago with sciatica, left side: Secondary | ICD-10-CM

## 2018-12-03 DIAGNOSIS — M5441 Lumbago with sciatica, right side: Secondary | ICD-10-CM | POA: Diagnosis not present

## 2018-12-03 DIAGNOSIS — M6281 Muscle weakness (generalized): Secondary | ICD-10-CM

## 2018-12-03 DIAGNOSIS — R262 Difficulty in walking, not elsewhere classified: Secondary | ICD-10-CM | POA: Diagnosis not present

## 2018-12-03 DIAGNOSIS — G8929 Other chronic pain: Secondary | ICD-10-CM

## 2018-12-03 NOTE — Therapy (Signed)
[place], [place], [address] Phone: [phone]   Fax:  [phone]  Physical Therapy Treatment  Patient Details  Name: Kathryn [name] MRN: [id number] Date of Birth: 29-Jan-1951 Referring Provider (PT): Dr. Alvy Bimler    Encounter Date: 12/03/2018  PT End of Session - 12/03/18 1238    Visit Number  5    Number of Visits  16   pt won't have regular visits due to chemo   Date for PT Re-Evaluation  12/15/18    PT Start Time  1022    PT Stop Time  1107    PT Time Calculation (min)  45 min    Activity Tolerance  Patient tolerated treatment well    Behavior During Therapy  [facility] for tasks assessed/performed       Past Medical History:  Diagnosis Date  . Arthritis   . Hypertension     Past Surgical History:  Procedure Laterality Date  . ANKLE SURGERY Right    right fusion  . BACK SURGERY     5 surgeries at [facility]; has screws and a "cage"  . CARPAL TUNNEL RELEASE Bilateral   . COLONOSCOPY    . IR IMAGING GUIDED PORT INSERTION  10/01/2018  . KNEE SURGERY Left   . LAPAROSCOPIC CHOLECYSTECTOMY    . LUMBAR LAMINECTOMY/DECOMPRESSION MICRODISCECTOMY Left 08/06/2013   Procedure: LUMBAR LAMINECTOMY/DECOMPRESSION MICRODISCECTOMY 1 LEVEL;  Surgeon: Ophelia Charter, MD;  Location: [place] [facility];  Service: Neurosurgery;  Laterality: Left;  redo LEFT Lumbar Five-Sacral One diskectomy  . PLACEMENT OF LUMBAR DRAIN N/A 08/07/2013   Procedure: PLACEMENT OF LUMBAR DRAIN;  Surgeon: Ophelia Charter, MD;  Location: [facility];  Service: Neurosurgery;  Laterality: N/A;  . TONSILLECTOMY    . TUBAL LIGATION  1982    There were no vitals filed for this visit.  Subjective Assessment - 12/03/18 1028    Subjective  My cancer markers are going down! I saw Dr. Denman George on 11/28/18 and we decided that since the chemo is working to finish that (4 of 6 is Friday) and then we'll schedule my hysterectomy probably end of April/beginning of  May. I have new nerve pain now that's running down the back of my leg, and still have the lateral leg pain. My pain management (Dr. Phillips)just says for me to try not to sit too long and don't do anything that aggravates it.     Pertinent History  Endometrial cancer diagnosed September 01, 2018  First  chemotherapy Dec. 20  and will have 3 treatments , one every 3 weeks, then retesting and possibly surgery and then 3 more chemos. but that schedule may change  past history includes 6 back surgeries from L1 down starting in 1974 due to degenerative disc disease from the neck down She had an MRI in May of 2018 and was told there were so many areas of compression that it was not able to tell where leg pain was coming from.  She is followed by Dr. Koleen Nimrod at [facility] and she has epidurals but is not able have them anymore because there is no space for  the needle     Patient Stated Goals  She is hoping that she can get more strength in her legs. Pt lives in [place] and is hoping to learn what she needs to do at home     Currently in Pain?  Yes    Pain Score  6  Pain Location  Leg    Pain Orientation  Left;Lateral;Posterior    Pain Descriptors / Indicators  Constant;Shooting    Pain Type  Neuropathic pain    Pain Onset  1 to 4 weeks ago    Pain Frequency  Constant    Aggravating Factors   prolonged sitting    Pain Relieving Factors  fetal position S/L and pain meds         [facility] PT Assessment - 12/03/18 0001      Timed Up and Go Test   Normal TUG (seconds)  12.25   no cane, end of session so pt starting to feel fatigued                  [facility] Adult PT Treatment/Exercise - 12/03/18 0001      Self-Care   Other Self-Care Comments   Answered pts questions about curent HEP and ways she can progress what she is able to do and to avoid what increases her pain. Also tried encouraging her to get back into water aerobics as this was something she really enjoyed and to bring  daugher with her her first few times back. Pt liked this idea and is considering returning to pool at [facility] sooner now. Also demonstrated wall push ups (pt fatigued at end of session and unable to return demo today) so she can further strenthen her arms for pulling herself out of the pool.      Knee/Hip Exercises: Aerobic   Nustep  Level 4 x11 mins monitoring      Knee/Hip Exercises: Standing   Other Standing Knee Exercises  In hall with +2 hands on wall for bil sidestepping with squat to Rt and Lt 45ft each way      Knee/Hip Exercises: Seated   Other Seated Knee/Hip Exercises  Nerve flossing for Lt LE; rocking upper body forward and back to point of tolerance, then ankle DF/PF x8 each to tolerance    Other Seated Knee/Hip Exercises  --    Sit to Sand  without UE support   x11 in 30 sec              PT Short Term Goals - 10/16/18 1549      PT SHORT TERM GOAL #1   Title  Pt will report she is able to do a home exercise program and document her progress at home     Time  4    Period  Weeks    Status  New        PT Long Term Goals - 12/03/18 1052      PT LONG TERM GOAL #1   Title  Pt will decrease her time for Normal TUG to < 10 seconds indicating an improvment in balance and gait    Baseline  12.76; 12.25-2/19/20    Status  On-going      PT LONG TERM GOAL #2   Title  Pt with increase # of reps of sit to stand in 30 seconds to < 13 indicating and increase in general strength    Baseline  11; 11 - 12/03/18    Status  On-going      PT LONG TERM GOAL #3   Title  Pt will report she is able to climb steps with 25% less difficulty so that she can get in and out of the pool for water exercise     Baseline  Pt still struggling with stairs so hasn't been back to pool-12/03/18  Status  On-going            Plan - 12/03/18 1239    Clinical Impression Statement  Pt is showing some progress towards goals. Her sit to stand was 11 in 30 seconds and she was able to do this  without UE support and fairly steady pace, also this was after NuStep when pt was some fatigued. Her TUG slightly improved to 12.25 sec and she also does this well, without AD and very steady gait. She reports her family members noticing her doing better around house and community ambulation. She would like to cont coming as able throughout chemo as she can tell this is helping her to become stronger, but alos she is enjoing becoming more educated on how she can best exercise at home with her comorbidities.     Rehab Potential  Good    Clinical Impairments Affecting Rehab Potential  limited back mobility and diffuse degernative disc disease , pt will not be able to come the week of chemo     PT Frequency  1x / week    PT Duration  8 weeks    PT Treatment/Interventions  ADLs/Self Care Home Management;DME Instruction;Gait training;Stair training;Functional mobility training;Cognitive remediation;Neuromuscular re-education;Therapeutic exercise;Therapeutic activities;Patient/family education;Energy conservation    PT Next Visit Plan  Cont with home exercise program for balance and general strength that she can follow through at home and review current prn assessing technique; add wall push ups to HEP    Consulted and Agree with Plan of Care  Patient       Patient will benefit from skilled therapeutic intervention in order to improve the following deficits and impairments:  Abnormal gait, Decreased knowledge of use of DME, Pain, Postural dysfunction, Increased muscle spasms, Decreased scar mobility, Decreased mobility, Decreased activity tolerance, Decreased endurance, Decreased strength, Impaired perceived functional ability, Obesity, Difficulty walking, Decreased balance, Decreased knowledge of precautions  Visit Diagnosis: Difficulty in walking  Muscle weakness (generalized)  Chronic bilateral low back pain with bilateral sciatica     Problem List Patient Active Problem List   Diagnosis Date  Noted  . Steroid-induced hyperglycemia 11/14/2018  . Chemotherapy-induced nausea 10/24/2018  . Other constipation 10/24/2018  . Insomnia due to medical condition 10/07/2018  . Chronic back pain greater than 3 months duration 09/26/2018  . Neuropathy due to medical condition ([place]) 09/26/2018  . Physical debility 09/26/2018  . Morbid (severe) obesity due to excess calories ([place]) 09/26/2018  . Endometrial cancer ([place]) 09/24/2018  . Adenopathy 09/24/2018    Otelia Limes, PTA 12/03/2018, 12:42 PM  [place], [place], [address] Phone: [phone]   Fax:  [phone]  Name: ADRIAN SPECHT MRN: [id number] Date of Birth: 02/18/1951

## 2018-12-03 NOTE — Telephone Encounter (Signed)
Kathryn [name] called and asked what to do if the roads are slippery on Friday morning before her appointments.  Advised her to call [phone] and ask for Dr. Calton Dach nurse if she is running late.

## 2018-12-05 ENCOUNTER — Encounter: Payer: Self-pay | Admitting: Hematology and Oncology

## 2018-12-05 ENCOUNTER — Inpatient Hospital Stay: Payer: Medicare Other

## 2018-12-05 ENCOUNTER — Inpatient Hospital Stay (HOSPITAL_BASED_OUTPATIENT_CLINIC_OR_DEPARTMENT_OTHER): Payer: Medicare Other | Admitting: Hematology and Oncology

## 2018-12-05 ENCOUNTER — Other Ambulatory Visit: Payer: Self-pay | Admitting: Hematology and Oncology

## 2018-12-05 ENCOUNTER — Telehealth: Payer: Self-pay | Admitting: Hematology and Oncology

## 2018-12-05 DIAGNOSIS — C541 Malignant neoplasm of endometrium: Secondary | ICD-10-CM

## 2018-12-05 DIAGNOSIS — R5381 Other malaise: Secondary | ICD-10-CM | POA: Diagnosis not present

## 2018-12-05 DIAGNOSIS — G63 Polyneuropathy in diseases classified elsewhere: Secondary | ICD-10-CM

## 2018-12-05 DIAGNOSIS — R739 Hyperglycemia, unspecified: Secondary | ICD-10-CM | POA: Diagnosis not present

## 2018-12-05 DIAGNOSIS — T380X5A Adverse effect of glucocorticoids and synthetic analogues, initial encounter: Secondary | ICD-10-CM

## 2018-12-05 DIAGNOSIS — C778 Secondary and unspecified malignant neoplasm of lymph nodes of multiple regions: Secondary | ICD-10-CM | POA: Diagnosis not present

## 2018-12-05 LAB — CMP (CANCER CENTER ONLY)
ALK PHOS: 114 U/L (ref 38–126)
ALT: 15 U/L (ref 0–44)
AST: 19 U/L (ref 15–41)
Albumin: 3.9 g/dL (ref 3.5–5.0)
Anion gap: 14 (ref 5–15)
BUN: 13 mg/dL (ref 8–23)
CO2: 24 mmol/L (ref 22–32)
Calcium: 9.6 mg/dL (ref 8.9–10.3)
Chloride: 101 mmol/L (ref 98–111)
Creatinine: 0.78 mg/dL (ref 0.44–1.00)
GFR, Est AFR Am: >60 mL/min (ref >60)
GFR, Estimated: >60 mL/min (ref >60)
Glucose, Bld: 170 mg/dL — ABNORMAL HIGH (ref 70–99)
Potassium: 3.7 mmol/L (ref 3.5–5.1)
SODIUM: 139 mmol/L (ref 135–145)
Total Bilirubin: 0.3 mg/dL (ref 0.3–1.2)
Total Protein: 7.9 g/dL (ref 6.5–8.1)

## 2018-12-05 LAB — CBC WITH DIFFERENTIAL (CANCER CENTER ONLY)
Abs Immature Granulocytes: 0.07 10*3/uL (ref 0.00–0.07)
BASOS ABS: 0 10*3/uL (ref 0.0–0.1)
Basophils Relative: 0 %
Eosinophils Absolute: 0 10*3/uL (ref 0.0–0.5)
Eosinophils Relative: 0 %
HCT: 39.5 % (ref 36.0–46.0)
Hemoglobin: 13.2 g/dL (ref 12.0–15.0)
Immature Granulocytes: 1 %
LYMPHS ABS: 1.2 10*3/uL (ref 0.7–4.0)
Lymphocytes Relative: 16 %
MCH: 31.2 pg (ref 26.0–34.0)
MCHC: 33.4 g/dL (ref 30.0–36.0)
MCV: 93.4 fL (ref 80.0–100.0)
Monocytes Absolute: 0.2 10*3/uL (ref 0.1–1.0)
Monocytes Relative: 3 %
Neutro Abs: 5.9 10*3/uL (ref 1.7–7.7)
Neutrophils Relative %: 80 %
Platelet Count: 200 10*3/uL (ref 150–400)
RBC: 4.23 MIL/uL (ref 3.87–5.11)
RDW: 18.3 % — ABNORMAL HIGH (ref 11.5–15.5)
WBC Count: 7.4 10*3/uL (ref 4.0–10.5)
nRBC: 0.3 % — ABNORMAL HIGH (ref 0.0–0.2)

## 2018-12-05 MED ORDER — HEPARIN SOD (PORK) LOCK FLUSH 100 UNIT/ML IV SOLN
500.0000 [IU] | Freq: Once | INTRAVENOUS | Status: AC | PRN
  Administered 2018-12-05: 500 [IU]
  Filled 2018-12-05: qty 5

## 2018-12-05 MED ORDER — PALONOSETRON HCL INJECTION 0.25 MG/5ML
0.2500 mg | Freq: Once | INTRAVENOUS | Status: AC
  Administered 2018-12-05: 0.25 mg via INTRAVENOUS

## 2018-12-05 MED ORDER — SODIUM CHLORIDE 0.9 % IV SOLN
Freq: Once | INTRAVENOUS | Status: AC
  Administered 2018-12-05: 10:00:00 via INTRAVENOUS
  Filled 2018-12-05: qty 250

## 2018-12-05 MED ORDER — SODIUM CHLORIDE 0.9% FLUSH
10.0000 mL | INTRAVENOUS | Status: DC | PRN
  Administered 2018-12-05: 10 mL
  Filled 2018-12-05: qty 10

## 2018-12-05 MED ORDER — SODIUM CHLORIDE 0.9 % IV SOLN
105.0000 mg/m2 | Freq: Once | INTRAVENOUS | Status: AC
  Administered 2018-12-05: 258 mg via INTRAVENOUS
  Filled 2018-12-05: qty 43

## 2018-12-05 MED ORDER — SODIUM CHLORIDE 0.9 % IV SOLN
750.0000 mg | Freq: Once | INTRAVENOUS | Status: AC
  Administered 2018-12-05: 750 mg via INTRAVENOUS
  Filled 2018-12-05: qty 75

## 2018-12-05 MED ORDER — PALONOSETRON HCL INJECTION 0.25 MG/5ML
INTRAVENOUS | Status: AC
  Filled 2018-12-05: qty 5

## 2018-12-05 MED ORDER — SODIUM CHLORIDE 0.9% FLUSH
10.0000 mL | Freq: Once | INTRAVENOUS | Status: AC
  Administered 2018-12-05: 10 mL
  Filled 2018-12-05: qty 10

## 2018-12-05 MED ORDER — SODIUM CHLORIDE 0.9 % IV SOLN
20.0000 mg | Freq: Once | INTRAVENOUS | Status: AC
  Administered 2018-12-05: 20 mg via INTRAVENOUS
  Filled 2018-12-05: qty 2

## 2018-12-05 MED ORDER — SODIUM CHLORIDE 0.9 % IV SOLN
Freq: Once | INTRAVENOUS | Status: AC
  Administered 2018-12-05: 10:00:00 via INTRAVENOUS
  Filled 2018-12-05: qty 5

## 2018-12-05 MED ORDER — FAMOTIDINE IN NACL 20-0.9 MG/50ML-% IV SOLN: 20.0000 mg | Freq: Once | INTRAVENOUS | Status: DC

## 2018-12-05 NOTE — Telephone Encounter (Signed)
Gave avs and calendar ° °

## 2018-12-05 NOTE — Assessment & Plan Note (Signed)
She is scheduled for physical therapy  She finds physical therapy beneficial and she will continue the same

## 2018-12-05 NOTE — Assessment & Plan Note (Signed)
She denies worsening peripheral neuropathy since recent dose reduction We will continue with prior reduced dose Taxol 

## 2018-12-05 NOTE — Assessment & Plan Note (Signed)
I have reviewed her recent blood work and CT imaging with both the patient and her son She had excellent response to neoadjuvant chemotherapy The plan will be to complete total of 6 cycles of chemotherapy before surgery She agreed with the plan of care

## 2018-12-05 NOTE — Assessment & Plan Note (Signed)
She has mild steroid-induced hyperglycemia I recommend reducing oral premedication dexamethasone in the future

## 2018-12-05 NOTE — Progress Notes (Signed)
[facility] OFFICE PROGRESS NOTE  Patient Care Team: Holt, Lynley S, MD as PCP - General (Family Medicine) Phillips, Mark, MD (Anesthesiology)  ASSESSMENT & PLAN:  Endometrial cancer (HCC) I have reviewed her recent blood work and CT imaging with both the patient and her son She had excellent response to neoadjuvant chemotherapy The plan will be to complete total of 6 cycles of chemotherapy before surgery She agreed with the plan of care  Steroid-induced hyperglycemia She has mild steroid-induced hyperglycemia I recommend reducing oral premedication dexamethasone in the future  Physical debility She is scheduled for physical therapy  She finds physical therapy beneficial and she will continue the same  Neuropathy due to medical condition (HCC) She denies worsening peripheral neuropathy since recent dose reduction We will continue with prior reduced dose Taxol   No orders of the defined types were placed in this encounter.   INTERVAL HISTORY: Please see below for problem oriented charting. She returns for appointment with her son for cycle 4 of chemotherapy She has met with surgical oncologist for planned surgery after cycle 6 of treatment She tolerated last cycle of therapy well Denies worsening neuropathy She continues to get physical therapy and that was helpful No significant changes in bowel habits or nausea  SUMMARY OF ONCOLOGIC HISTORY: Oncology History   MSI - Stable MMR - Normal Endometrioid     Endometrial cancer (HCC)   07/15/2018 Initial Diagnosis    She noted postmenopausal bleeding for the first time in October 2019. A Pap was done 09/02/18 showign ASC-H. She was referred onto GYN and seen by Dr. Kathy Richardson     09/09/2018 Procedure    She underwent endometrial biopsy    09/12/2018 Pathology Results    Endometrial biopsy positive for adenocarcinoma    09/22/2018 Imaging    Ct abdomen and pelvis showed: Diffuse endometrial  thickening, consistent with primary endometrial carcinoma.  Pelvic and abdominal retroperitoneal and retrocrural lymphadenopathy, consistent with metastatic disease.  Moderate hepatic steatosis.    10/01/2018 Procedure    Successful placement of a right IJ approach Power Port with ultrasound and fluoroscopic guidance. The catheter is ready for use.    10/02/2018 Tumor Marker    Patient's tumor was tested for the following markers: CA-125 Results of the tumor marker test revealed 578    10/03/2018 -  Chemotherapy    The patient had carboplatin and Taxol with dose adjustment due to neuropathy     10/24/2018 Tumor Marker    Patient's tumor was tested for the following markers: CA-125 Results of the tumor marker test revealed 296     Genetic Testing    Patient has genetic testing done for MSI/MMR. Results revealed MSI stable and MMR normal on Accession [id number].    11/14/2018 Tumor Marker    Patient's tumor was tested for the following markers: CA-125 Results of the tumor marker test revealed 81    11/27/2018 Imaging    1. Significant decrease in abdominal retroperitoneal and bilateral iliac lymphadenopathy since previous study. 2. Significant decrease in abnormal endometrial soft tissue density since prior study. 3. No new or progressive metastatic disease identified.     REVIEW OF SYSTEMS:   Constitutional: Denies fevers, chills or abnormal weight loss Eyes: Denies blurriness of vision Ears, nose, mouth, throat, and face: Denies mucositis or sore throat Respiratory: Denies cough, dyspnea or wheezes Cardiovascular: Denies palpitation, chest discomfort or lower extremity swelling Gastrointestinal:  Denies nausea, heartburn or change in bowel habits Skin: Denies abnormal   skin rashes Lymphatics: Denies new lymphadenopathy or easy bruising Neurological:Denies numbness, tingling or new weaknesses Behavioral/Psych: Mood is stable, no new changes  All other systems were  reviewed with the patient and are negative.  I have reviewed the past medical history, past surgical history, social history and family history with the patient and they are unchanged from previous note.  ALLERGIES:  has No Known Allergies.  MEDICATIONS:  Current Outpatient Medications  Medication Sig Dispense Refill  . dexamethasone (DECADRON) 4 MG tablet Take 3 tabs at the night before and 3 tab the morning of chemotherapy, every 3 weeks, by mouth 60 tablet 0  . diphenhydrAMINE (SOMINEX) 25 MG tablet Take 100 mg by mouth 2 (two) times daily.     Marland Kitchen gabapentin (NEURONTIN) 400 MG capsule Take 800 mg by mouth 3 (three) times daily.   1  . hydrochlorothiazide (MICROZIDE) 12.5 MG capsule Take 12.5 mg by mouth daily.  0  . lidocaine-prilocaine (EMLA) cream Apply to affected area once 30 g 3  . loratadine (CLARITIN) 10 MG tablet Take 10 mg by mouth daily as needed for allergies.    Marland Kitchen losartan (COZAAR) 50 MG tablet Take 50 mg by mouth daily.  2  . ondansetron (ZOFRAN) 8 MG tablet Take 1 tablet (8 mg total) by mouth every 8 (eight) hours as needed for refractory nausea / vomiting. Start on day 3 after chemo. 30 tablet 1  . oxyCODONE-acetaminophen (PERCOCET) 10-325 MG per tablet Take 2 tablets by mouth every 8 (eight) hours as needed for pain.     Marland Kitchen prochlorperazine (COMPAZINE) 10 MG tablet Take 1 tablet (10 mg total) by mouth every 6 (six) hours as needed (Nausea or vomiting). 30 tablet 1  . zolpidem (AMBIEN) 5 MG tablet Take 1 tablet (5 mg total) by mouth at bedtime as needed for sleep. 30 tablet 0   No current facility-administered medications for this visit.    Facility-Administered Medications Ordered in Other Visits  Medication Dose Route Frequency Provider Last Rate Last Dose  . CARBOplatin (PARAPLATIN) 750 mg in sodium chloride 0.9 % 250 mL chemo infusion  750 mg Intravenous Once Alvy Bimler, Tyah Acord, MD      . fosaprepitant (EMEND) 150 mg, dexamethasone (DECADRON) 12 mg in sodium chloride 0.9 % 145  mL IVPB   Intravenous Once Alvy Bimler, Snyder Colavito, MD      . heparin lock flush 100 unit/mL  500 Units Intracatheter Once PRN Alvy Bimler, Jamile Sivils, MD      . PACLitaxel (TAXOL) 258 mg in sodium chloride 0.9 % 250 mL chemo infusion (> 10m/m2)  105 mg/m2 (Treatment Plan Recorded) Intravenous Once Kymiah Araiza, MD      . sodium chloride flush (NS) 0.9 % injection 10 mL  10 mL Intracatheter PRN GAlvy Bimler Jaquis Picklesimer, MD        PHYSICAL EXAMINATION: ECOG PERFORMANCE STATUS: 1 - Symptomatic but completely ambulatory  Vitals:   12/05/18 0854  BP: (!) 154/92  Pulse: (!) 103  Resp: 17  Temp: 98.2 F (36.8 C)  SpO2: 99%   Filed Weights   12/05/18 0854  Weight: (!) 301 lb 6.4 oz (136.7 kg)    GENERAL:alert, no distress and comfortable SKIN: skin color, texture, turgor are normal, no rashes or significant lesions EYES: normal, Conjunctiva are pink and non-injected, sclera clear OROPHARYNX:no exudate, no erythema and lips, buccal mucosa, and tongue normal  NECK: supple, thyroid normal size, non-tender, without nodularity LYMPH:  no palpable lymphadenopathy in the cervical, axillary or inguinal LUNGS: clear to auscultation and  percussion with normal breathing effort HEART: regular rate & rhythm and no murmurs and no lower extremity edema ABDOMEN:abdomen soft, non-tender and normal bowel sounds Musculoskeletal:no cyanosis of digits and no clubbing  NEURO: alert & oriented x 3 with fluent speech, no focal motor/sensory deficits  LABORATORY DATA:  I have reviewed the data as listed    Component Value Date/Time   NA 139 12/05/2018 0836   K 3.7 12/05/2018 0836   CL 101 12/05/2018 0836   CO2 24 12/05/2018 0836   GLUCOSE 170 (H) 12/05/2018 0836   BUN 13 12/05/2018 0836   CREATININE 0.78 12/05/2018 0836   CALCIUM 9.6 12/05/2018 0836   PROT 7.9 12/05/2018 0836   ALBUMIN 3.9 12/05/2018 0836   AST 19 12/05/2018 0836   ALT 15 12/05/2018 0836   ALKPHOS 114 12/05/2018 0836   BILITOT 0.3 12/05/2018 0836   GFRNONAA >60  12/05/2018 0836   GFRAA >60 12/05/2018 0836    No results found for: SPEP, UPEP  Lab Results  Component Value Date   WBC 7.4 12/05/2018   NEUTROABS 5.9 12/05/2018   HGB 13.2 12/05/2018   HCT 39.5 12/05/2018   MCV 93.4 12/05/2018   PLT 200 12/05/2018      Chemistry      Component Value Date/Time   NA 139 12/05/2018 0836   K 3.7 12/05/2018 0836   CL 101 12/05/2018 0836   CO2 24 12/05/2018 0836   BUN 13 12/05/2018 0836   CREATININE 0.78 12/05/2018 0836      Component Value Date/Time   CALCIUM 9.6 12/05/2018 0836   ALKPHOS 114 12/05/2018 0836   AST 19 12/05/2018 0836   ALT 15 12/05/2018 0836   BILITOT 0.3 12/05/2018 0836       RADIOGRAPHIC STUDIES: I have reviewed multiple imaging studies with the patient and her son  I have personally reviewed the radiological images as listed and agreed with the findings in the report. Ct Abdomen Pelvis W Contrast  Result Date: 11/27/2018 CLINICAL DATA:  Followup endometrial carcinoma. Undergoing chemotherapy. EXAM: CT ABDOMEN AND PELVIS WITH CONTRAST TECHNIQUE: Multidetector CT imaging of the abdomen and pelvis was performed using the standard protocol following bolus administration of intravenous contrast. CONTRAST:  100mL OMNIPAQUE IOHEXOL 300 MG/ML  SOLN COMPARISON:  09/20/2018 FINDINGS: Lower Chest: No acute findings. Hepatobiliary: No hepatic masses identified. Decreased hepatic steatosis noted. A few small left hepatic lobe cysts are stable. No liver masses identified. Prior cholecystectomy. No evidence of biliary obstruction. Pancreas:  No mass or inflammatory changes. Spleen: Within normal limits in size and appearance. Adrenals/Urinary Tract: No masses identified. No evidence of hydronephrosis. Stomach/Bowel: No evidence of obstruction, inflammatory process or abnormal fluid collections. Diverticulosis is seen mainly involving the sigmoid colon, however there is no evidence of diverticulitis. Vascular/Lymphatic: Abdominal  retroperitoneal lymphadenopathy is significantly decreased since previous study. Index lymph node in aortocaval space currently measures 10 mm on image 36/2 compared to 17 mm previously. Decreased bilateral iliac lymphadenopathy is also demonstrated, with index lymph node in left common iliac chain measuring 10 mm on image 55/2 compared to 2.3 cm previously. No new or increased lymphadenopathy identified. No evidence of abdominal aortic aneurysm. Aortic atherosclerosis. Reproductive: Uterus is unremarkable in appearance with significant decrease in abnormal endometrial soft tissue density since prior study. Adnexal regions are unremarkable. Other:  None. Musculoskeletal:  No suspicious bone lesions identified. IMPRESSION: 1. Significant decrease in abdominal retroperitoneal and bilateral iliac lymphadenopathy since previous study. 2. Significant decrease in abnormal endometrial soft tissue density   since prior study. 3. No new or progressive metastatic disease identified. Electronically Signed   By: Earle Gell M.D.   On: 11/27/2018 10:10    All questions were answered. The patient knows to call the clinic with any problems, questions or concerns. No barriers to learning was detected.  I spent 15 minutes counseling the patient face to face. The total time spent in the appointment was 20 minutes and more than 50% was on counseling and review of test results  Heath Lark, MD 12/05/2018 10:16 AM

## 2018-12-05 NOTE — Patient Instructions (Signed)
   [facility] Discharge Instructions for Patients Receiving Chemotherapy  Today you received the following chemotherapy agents Taxol and Carboplatin   To help prevent nausea and vomiting after your treatment, we encourage you to take your nausea medication as directed.    If you develop nausea and vomiting that is not controlled by your nausea medication, call the clinic.   BELOW ARE SYMPTOMS THAT SHOULD BE REPORTED IMMEDIATELY:  *FEVER GREATER THAN 100.5 F  *CHILLS WITH OR WITHOUT FEVER  NAUSEA AND VOMITING THAT IS NOT CONTROLLED WITH YOUR NAUSEA MEDICATION  *UNUSUAL SHORTNESS OF BREATH  *UNUSUAL BRUISING OR BLEEDING  TENDERNESS IN MOUTH AND THROAT WITH OR WITHOUT PRESENCE OF ULCERS  *URINARY PROBLEMS  *BOWEL PROBLEMS  UNUSUAL RASH Items with * indicate a potential emergency and should be followed up as soon as possible.  Feel free to call the clinic should you have any questions or concerns. The clinic phone number is [phone].  Please show the CHEMO ALERT CARD at check-in to the Emergency Department and triage nurse.   

## 2018-12-06 LAB — CA 125: Cancer Antigen (CA) 125: 46.3 U/mL — ABNORMAL HIGH (ref 0.0–38.1)

## 2018-12-08 ENCOUNTER — Telehealth: Payer: Self-pay

## 2018-12-08 NOTE — Telephone Encounter (Signed)
Spoke with pt by phone and gave CA 125 results.

## 2018-12-17 ENCOUNTER — Ambulatory Visit: Payer: Medicare Other | Attending: Hematology and Oncology

## 2018-12-17 DIAGNOSIS — M5416 Radiculopathy, lumbar region: Secondary | ICD-10-CM | POA: Diagnosis not present

## 2018-12-17 DIAGNOSIS — M961 Postlaminectomy syndrome, not elsewhere classified: Secondary | ICD-10-CM | POA: Diagnosis not present

## 2018-12-17 DIAGNOSIS — M5441 Lumbago with sciatica, right side: Secondary | ICD-10-CM | POA: Insufficient documentation

## 2018-12-17 DIAGNOSIS — M6281 Muscle weakness (generalized): Secondary | ICD-10-CM

## 2018-12-17 DIAGNOSIS — G894 Chronic pain syndrome: Secondary | ICD-10-CM | POA: Diagnosis not present

## 2018-12-17 DIAGNOSIS — Z79891 Long term (current) use of opiate analgesic: Secondary | ICD-10-CM | POA: Diagnosis not present

## 2018-12-17 DIAGNOSIS — R262 Difficulty in walking, not elsewhere classified: Secondary | ICD-10-CM | POA: Diagnosis not present

## 2018-12-17 DIAGNOSIS — M5442 Lumbago with sciatica, left side: Secondary | ICD-10-CM | POA: Insufficient documentation

## 2018-12-17 DIAGNOSIS — G8929 Other chronic pain: Secondary | ICD-10-CM | POA: Diagnosis not present

## 2018-12-17 NOTE — Addendum Note (Signed)
Addended by: Stark Bray on: 12/17/2018 09:32 PM   Modules accepted: Orders

## 2018-12-17 NOTE — Therapy (Signed)
[place], [place], [address] Phone: [phone]   Fax:  [phone]  Physical Therapy Treatment  Patient Details  Name: Kathryn [name] MRN: [id number] Date of Birth: May 30, 1951 Referring Provider (PT): Dr. Alvy Bimler    Encounter Date: 12/17/2018  PT End of Session - 12/17/18 1036    Visit Number  6    Number of Visits  16   pt does not need incr visit count, just extended time as pt unable to come consistently due to chemo   Date for PT Re-Evaluation  02/11/19    PT Start Time  0945    PT Stop Time  1032    PT Time Calculation (min)  47 min    Activity Tolerance  Patient tolerated treatment well    Behavior During Therapy  [facility] for tasks assessed/performed       Past Medical History:  Diagnosis Date  . Arthritis   . Hypertension     Past Surgical History:  Procedure Laterality Date  . ANKLE SURGERY Right    right fusion  . BACK SURGERY     5 surgeries at [facility]; has screws and a "cage"  . CARPAL TUNNEL RELEASE Bilateral   . COLONOSCOPY    . IR IMAGING GUIDED PORT INSERTION  10/01/2018  . KNEE SURGERY Left   . LAPAROSCOPIC CHOLECYSTECTOMY    . LUMBAR LAMINECTOMY/DECOMPRESSION MICRODISCECTOMY Left 08/06/2013   Procedure: LUMBAR LAMINECTOMY/DECOMPRESSION MICRODISCECTOMY 1 LEVEL;  Surgeon: Ophelia Charter, MD;  Location: [place] [facility];  Service: Neurosurgery;  Laterality: Left;  redo LEFT Lumbar Five-Sacral One diskectomy  . PLACEMENT OF LUMBAR DRAIN N/A 08/07/2013   Procedure: PLACEMENT OF LUMBAR DRAIN;  Surgeon: Ophelia Charter, MD;  Location: [place];  Service: Neurosurgery;  Laterality: N/A;  . TONSILLECTOMY    . TUBAL LIGATION  1982    There were no vitals filed for this visit.  Subjective Assessment - 12/17/18 0952    Subjective  I just left my pain doctor and he is going to change my med but hasn't as of yet. I'm having an inner ear thing today so I'm a little off balance today.      Pertinent History  Endometrial cancer diagnosed September 01, 2018  First  chemotherapy Dec. 20  and will have 3 treatments , one every 3 weeks, then retesting and possibly surgery and then 3 more chemos. but that schedule may change  past history includes 6 back surgeries from L1 down starting in 1974 due to degenerative disc disease from the neck down She had an MRI in May of 2018 and was told there were so many areas of compression that it was not able to tell where leg pain was coming from.  She is followed by Dr. Koleen Nimrod at [facility] and she has epidurals but is not able have them anymore because there is no space for  the needle     Patient Stated Goals  She is hoping that she can get more strength in her legs. Pt lives in [place] and is hoping to learn what she needs to do at home     Currently in Pain?  Yes    Pain Score  4     Pain Location  Leg    Pain Orientation  Left;Lateral    Pain Descriptors / Indicators  Constant;Dull    Pain Type  Neuropathic pain    Pain Onset  1 to 4 weeks ago  Pain Frequency  Constant    Aggravating Factors   prolonged sitting    Pain Relieving Factors  pain meds and laying on side                       [facility] Adult PT Treatment/Exercise - 12/17/18 0001      Neuro Re-ed    Neuro Re-ed Details   In // bars: Slow, high knee walking 2x; toe walking 4x with demo and VCs for correct techniqure and muscle engagment;       Knee/Hip Exercises: Aerobic   Nustep  Level 5, x 10 mins monitoring pts pain throughout      Knee/Hip Exercises: Standing   Hip Flexion  AROM;Right;Left;10 reps    Hip Flexion Limitations  in // bars with VCs for slow, controlled pattern    Hip Abduction  AROM;Right;Left;10 reps    Abduction Limitations  in // bars with VCs and demo to decr er    Hip Extension  AROM;Right;Left;10 reps    Extension Limitations  in // bars and VCs to decr forward trunk lean      Shoulder Exercises: Seated   Other Seated  Exercises  Rt, then Lt OH press; bil abduction and flexion; bil bicep curls all 2 lbs, 10x each returning therapist demo      Shoulder Exercises: Standing   Other Standing Exercises  Push Ups on outside on // bars 10x returning therapist demo               PT Short Term Goals - 10/16/18 1549      PT SHORT TERM GOAL #1   Title  Pt will report she is able to do a home exercise program and document her progress at home     Time  4    Period  Weeks    Status  New        PT Long Term Goals - 12/17/18 1039      PT LONG TERM GOAL #1   Title  Pt will decrease her time for Normal TUG to < 10 seconds indicating an improvment in balance and gait    Baseline  12.76; 12.25-2/19/20; did not retest today but pt reports sit-stand easier now as her legs are feeling stronger-12/17/18    Status  On-going      PT LONG TERM GOAL #2   Title  Pt with increase # of reps of sit to stand in 30 seconds to < 13 indicating and increase in general strength    Baseline  11; 11 - 12/03/18; did not retest otday but pt is demonstrating greater ease with sit-stand multiple reps during session-12/17/18    Status  On-going      PT LONG TERM GOAL #3   Title  Pt will report she is able to climb steps with 25% less difficulty so that she can get in and out of the pool for water exercise     Baseline  Pt still struggling with stairs so hasn't been back to pool-12/03/18; pt reports though she hasn't been back to pool yet her arms and legs are feeling stronger with all ADLs-3/4/2    Status  On-going            Plan - 12/17/18 1043    Clinical Impression Statement  Pt continues to come as able between chemo treatments. These leave her feeling very fatigued and she can not keep food down for up to 4-5 days  after session. Today though pt was c/o inner ear issue and feeling of balance, she did very well with prolonged standing activities in // bars, taking seated rest breaks prn for LE fatigue. She reports overall  noting improvements with her endurance as she doesn't have to stop to rest as frequently with community ambulation (like walking down long hallways at hospital when she goes for chemo, and at grocery store). Pt also reports great benefit from continued encouragement that she receives when at PT in regards to her HEP and learning ways to modify exercises due to her co-morbidities. Pt will benefit from being able to cont to come to focus on continuing to improve her LE and UE strength, overall endurance and progressing HEP as needed during chemo. Pt is showing progress towards her goals, though slowly, which is to be expected due to pts co-morbidities.     Rehab Potential  Good    Clinical Impairments Affecting Rehab Potential  limited back mobility and diffuse degernative disc disease , pt will not be able to come the week of chemo     PT Frequency  1x / week    PT Duration  8 weeks    PT Treatment/Interventions  ADLs/Self Care Home Management;DME Instruction;Gait training;Stair training;Functional mobility training;Cognitive remediation;Neuromuscular re-education;Therapeutic exercise;Therapeutic activities;Patient/family education;Energy conservation    PT Next Visit Plan  Renewal today for pt to cont coming 1x/wk (may be q other due to chemo). Cont with balance and endurance activities, including functional UE and LE strength; cont to discuss HEP with pt modifying and progressing as pt tolerates    Consulted and Agree with Plan of Care  Patient       Patient will benefit from skilled therapeutic intervention in order to improve the following deficits and impairments:  Abnormal gait, Decreased knowledge of use of DME, Pain, Postural dysfunction, Increased muscle spasms, Decreased scar mobility, Decreased mobility, Decreased activity tolerance, Decreased endurance, Decreased strength, Impaired perceived functional ability, Obesity, Difficulty walking, Decreased balance, Decreased knowledge of  precautions  Visit Diagnosis: Difficulty in walking  Muscle weakness (generalized)  Chronic bilateral low back pain with bilateral sciatica     Problem List Patient Active Problem List   Diagnosis Date Noted  . Steroid-induced hyperglycemia 11/14/2018  . Chemotherapy-induced nausea 10/24/2018  . Other constipation 10/24/2018  . Insomnia due to medical condition 10/07/2018  . Chronic back pain greater than 3 months duration 09/26/2018  . Neuropathy due to medical condition ([place]) 09/26/2018  . Physical debility 09/26/2018  . Morbid (severe) obesity due to excess calories ([place]) 09/26/2018  . Endometrial cancer ([place]) 09/24/2018  . Adenopathy 09/24/2018    Otelia Limes, PTA 12/17/2018, 10:55 AM  [place], [place], [address] Phone: [phone]   Fax:  [phone]  Name: JUNI GLAAB MRN: [id number] Date of Birth: [birth year]

## 2018-12-24 ENCOUNTER — Other Ambulatory Visit: Payer: Self-pay

## 2018-12-24 ENCOUNTER — Ambulatory Visit: Payer: Medicare Other

## 2018-12-24 DIAGNOSIS — M5442 Lumbago with sciatica, left side: Secondary | ICD-10-CM | POA: Diagnosis not present

## 2018-12-24 DIAGNOSIS — M6281 Muscle weakness (generalized): Secondary | ICD-10-CM

## 2018-12-24 DIAGNOSIS — R262 Difficulty in walking, not elsewhere classified: Secondary | ICD-10-CM

## 2018-12-24 DIAGNOSIS — M5441 Lumbago with sciatica, right side: Secondary | ICD-10-CM | POA: Diagnosis not present

## 2018-12-24 DIAGNOSIS — G8929 Other chronic pain: Secondary | ICD-10-CM

## 2018-12-24 NOTE — Therapy (Signed)
[place], [place], [address] Phone: [phone]   Fax:  [phone]  Physical Therapy Treatment  Patient Details  Name: Kathryn [name] MRN: [id number] Date of Birth: 1951/09/13 Referring Provider (PT): Dr. Alvy Bimler    Encounter Date: 12/24/2018  PT End of Session - 12/24/18 1041    Visit Number  7    Number of Visits  16    Date for PT Re-Evaluation  02/11/19    PT Start Time  1018    PT Stop Time  1053   ended session early due to pts request due to increased fatigue   PT Time Calculation (min)  35 min    Activity Tolerance  Patient limited by fatigue    Behavior During Therapy  [facility] for tasks assessed/performed       Past Medical History:  Diagnosis Date  . Arthritis   . Hypertension     Past Surgical History:  Procedure Laterality Date  . ANKLE SURGERY Right    right fusion  . BACK SURGERY     5 surgeries at [facility]; has screws and a "cage"  . CARPAL TUNNEL RELEASE Bilateral   . COLONOSCOPY    . IR IMAGING GUIDED PORT INSERTION  10/01/2018  . KNEE SURGERY Left   . LAPAROSCOPIC CHOLECYSTECTOMY    . LUMBAR LAMINECTOMY/DECOMPRESSION MICRODISCECTOMY Left 08/06/2013   Procedure: LUMBAR LAMINECTOMY/DECOMPRESSION MICRODISCECTOMY 1 LEVEL;  Surgeon: Ophelia Charter, MD;  Location: [place] [facility];  Service: Neurosurgery;  Laterality: Left;  redo LEFT Lumbar Five-Sacral One diskectomy  . PLACEMENT OF LUMBAR DRAIN N/A 08/07/2013   Procedure: PLACEMENT OF LUMBAR DRAIN;  Surgeon: Ophelia Charter, MD;  Location: [place];  Service: Neurosurgery;  Laterality: N/A;  . TONSILLECTOMY    . TUBAL LIGATION  1982    There were no vitals filed for this visit.  Subjective Assessment - 12/24/18 1025    Subjective  I've started taking a different oxycodone (without the acetametaphine) but my pain is worse so it might not last (could not add this to list but per pt report it's oxycodone HCL 20 mg).  Bc of this I  just feel more fatigued than usual so might need to make today a shorter visit.     Pertinent History  Endometrial cancer diagnosed September 01, 2018  First  chemotherapy Dec. 20  and will have 3 treatments , one every 3 weeks, then retesting and possibly surgery and then 3 more chemos. but that schedule may change  past history includes 6 back surgeries from L1 down starting in 1974 due to degenerative disc disease from the neck down She had an MRI in May of 2018 and was told there were so many areas of compression that it was not able to tell where leg pain was coming from.  She is followed by Dr. Koleen Nimrod at [facility] and she has epidurals but is not able have them anymore because there is no space for  the needle     Patient Stated Goals  She is hoping that she can get more strength in her legs. Pt lives in [place] and is hoping to learn what she needs to do at home     Currently in Pain?  Yes    Pain Score  8     Pain Location  Leg    Pain Orientation  Left    Pain Descriptors / Indicators  Aching;Dull    Pain Type  Neuropathic  pain    Pain Onset  1 to 4 weeks ago    Pain Frequency  Constant    Aggravating Factors   new meds    Pain Relieving Factors  trying to move around                       [facility] Adult PT Treatment/Exercise - 12/24/18 0001      Neuro Re-ed    Neuro Re-ed Details   In // bars: Slow, controlled marching; then toe walking x2 each; then bil sidestepping with squats 1x each direction then seated rest after due to LE faitgue and increased pain in Lt. Also tried Lt calf stretch in bars but felt more pain, than stretch so stopped      Knee/Hip Exercises: Aerobic   Nustep  Level 5, x 11 mins monitoring pts pain throughout               PT Short Term Goals - 10/16/18 1549      PT SHORT TERM GOAL #1   Title  Pt will report she is able to do a home exercise program and document her progress at home     Time  4    Period  Weeks    Status   New        PT Long Term Goals - 12/17/18 1039      PT LONG TERM GOAL #1   Title  Pt will decrease her time for Normal TUG to < 10 seconds indicating an improvment in balance and gait    Baseline  12.76; 12.25-2/19/20; did not retest today but pt reports sit-stand easier now as her legs are feeling stronger-12/17/18    Status  On-going      PT LONG TERM GOAL #2   Title  Pt with increase # of reps of sit to stand in 30 seconds to < 13 indicating and increase in general strength    Baseline  11; 11 - 12/03/18; did not retest otday but pt is demonstrating greater ease with sit-stand multiple reps during session-12/17/18    Status  On-going      PT LONG TERM GOAL #3   Title  Pt will report she is able to climb steps with 25% less difficulty so that she can get in and out of the pool for water exercise     Baseline  Pt still struggling with stairs so hasn't been back to pool-12/03/18; pt reports though she hasn't been back to pool yet her arms and legs are feeling stronger with all ADLs-3/4/2    Status  On-going            Plan - 12/24/18 1042    Clinical Impression Statement  Focused on what pt was able to tolerate today. Briefly in // bars for dynamic balance as pt was able to tolerate. Then NuStep as pt reports this helps loosens her leg and decrease her Lt leg pain some. Ended early due to pt request as she reports feeling more fatigued than usual with increased Lt leg pain in general. Next round of chemo is Friday so pt will not be here for treatment next week.     Rehab Potential  Good    Clinical Impairments Affecting Rehab Potential  limited back mobility and diffuse degernative disc disease , pt will not be able to come the week of chemo     PT Frequency  1x / week    PT  Duration  8 weeks    PT Treatment/Interventions  ADLs/Self Care Home Management;DME Instruction;Gait training;Stair training;Functional mobility training;Cognitive remediation;Neuromuscular re-education;Therapeutic  exercise;Therapeutic activities;Patient/family education;Energy conservation    PT Next Visit Plan  Cont with balance and endurance activities, including functional UE and LE strength; work on progressing HEP next session, maybe instruct pt in circuit training type exercises.    Consulted and Agree with Plan of Care  Patient       Patient will benefit from skilled therapeutic intervention in order to improve the following deficits and impairments:  Abnormal gait, Decreased knowledge of use of DME, Pain, Postural dysfunction, Increased muscle spasms, Decreased scar mobility, Decreased mobility, Decreased activity tolerance, Decreased endurance, Decreased strength, Impaired perceived functional ability, Obesity, Difficulty walking, Decreased balance, Decreased knowledge of precautions  Visit Diagnosis: Difficulty in walking  Muscle weakness (generalized)  Chronic bilateral low back pain with bilateral sciatica     Problem List Patient Active Problem List   Diagnosis Date Noted  . Steroid-induced hyperglycemia 11/14/2018  . Chemotherapy-induced nausea 10/24/2018  . Other constipation 10/24/2018  . Insomnia due to medical condition 10/07/2018  . Chronic back pain greater than 3 months duration 09/26/2018  . Neuropathy due to medical condition ([place]) 09/26/2018  . Physical debility 09/26/2018  . Morbid (severe) obesity due to excess calories ([place]) 09/26/2018  . Endometrial cancer ([place]) 09/24/2018  . Adenopathy 09/24/2018    Otelia Limes, PTA 12/24/2018, 10:59 AM  [place], [place], [address] Phone: [phone]   Fax:  [phone]  Name: Kathryn [name] MRN: [id number] Date of Birth: [birth year]

## 2018-12-26 ENCOUNTER — Inpatient Hospital Stay: Payer: Medicare Other | Admitting: Hematology and Oncology

## 2018-12-26 ENCOUNTER — Inpatient Hospital Stay: Payer: Medicare Other

## 2018-12-26 ENCOUNTER — Other Ambulatory Visit: Payer: Self-pay

## 2018-12-26 ENCOUNTER — Encounter: Payer: Self-pay | Admitting: Hematology and Oncology

## 2018-12-26 ENCOUNTER — Inpatient Hospital Stay: Payer: Medicare Other | Attending: Hematology and Oncology

## 2018-12-26 DIAGNOSIS — G63 Polyneuropathy in diseases classified elsewhere: Secondary | ICD-10-CM | POA: Diagnosis not present

## 2018-12-26 DIAGNOSIS — G620 Drug-induced polyneuropathy: Secondary | ICD-10-CM | POA: Diagnosis not present

## 2018-12-26 DIAGNOSIS — M549 Dorsalgia, unspecified: Secondary | ICD-10-CM | POA: Insufficient documentation

## 2018-12-26 DIAGNOSIS — R739 Hyperglycemia, unspecified: Secondary | ICD-10-CM | POA: Insufficient documentation

## 2018-12-26 DIAGNOSIS — G8929 Other chronic pain: Secondary | ICD-10-CM | POA: Diagnosis not present

## 2018-12-26 DIAGNOSIS — T380X5A Adverse effect of glucocorticoids and synthetic analogues, initial encounter: Secondary | ICD-10-CM

## 2018-12-26 DIAGNOSIS — Z5111 Encounter for antineoplastic chemotherapy: Secondary | ICD-10-CM | POA: Diagnosis not present

## 2018-12-26 DIAGNOSIS — C541 Malignant neoplasm of endometrium: Secondary | ICD-10-CM | POA: Insufficient documentation

## 2018-12-26 LAB — CBC WITH DIFFERENTIAL (CANCER CENTER ONLY)
ABS IMMATURE GRANULOCYTES: 0.03 10*3/uL (ref 0.00–0.07)
Basophils Absolute: 0 10*3/uL (ref 0.0–0.1)
Basophils Relative: 0 %
Eosinophils Absolute: 0 10*3/uL (ref 0.0–0.5)
Eosinophils Relative: 0 %
HCT: 37 % (ref 36.0–46.0)
Hemoglobin: 12.2 g/dL (ref 12.0–15.0)
Immature Granulocytes: 1 %
Lymphocytes Relative: 14 %
Lymphs Abs: 0.9 10*3/uL (ref 0.7–4.0)
MCH: 32.3 pg (ref 26.0–34.0)
MCHC: 33 g/dL (ref 30.0–36.0)
MCV: 97.9 fL (ref 80.0–100.0)
Monocytes Absolute: 0.2 10*3/uL (ref 0.1–1.0)
Monocytes Relative: 4 %
Neutro Abs: 5.4 10*3/uL (ref 1.7–7.7)
Neutrophils Relative %: 81 %
PLATELETS: 160 10*3/uL (ref 150–400)
RBC: 3.78 MIL/uL — ABNORMAL LOW (ref 3.87–5.11)
RDW: 19.3 % — ABNORMAL HIGH (ref 11.5–15.5)
WBC Count: 6.6 10*3/uL (ref 4.0–10.5)
nRBC: 0 % (ref 0.0–0.2)

## 2018-12-26 LAB — CMP (CANCER CENTER ONLY)
ALBUMIN: 3.6 g/dL (ref 3.5–5.0)
ALT: 15 U/L (ref 0–44)
AST: 13 U/L — ABNORMAL LOW (ref 15–41)
Alkaline Phosphatase: 120 U/L (ref 38–126)
Anion gap: 15 (ref 5–15)
BUN: 17 mg/dL (ref 8–23)
CO2: 25 mmol/L (ref 22–32)
Calcium: 9 mg/dL (ref 8.9–10.3)
Chloride: 101 mmol/L (ref 98–111)
Creatinine: 0.8 mg/dL (ref 0.44–1.00)
GFR, Est AFR Am: >60 mL/min (ref >60)
GFR, Estimated: >60 mL/min (ref >60)
GLUCOSE: 150 mg/dL — AB (ref 70–99)
Potassium: 3.6 mmol/L (ref 3.5–5.1)
Sodium: 141 mmol/L (ref 135–145)
Total Bilirubin: 0.5 mg/dL (ref 0.3–1.2)
Total Protein: 7.7 g/dL (ref 6.5–8.1)

## 2018-12-26 MED ORDER — SODIUM CHLORIDE 0.9 % IV SOLN
Freq: Once | INTRAVENOUS | Status: AC
  Administered 2018-12-26: 09:00:00 via INTRAVENOUS
  Filled 2018-12-26: qty 250

## 2018-12-26 MED ORDER — SODIUM CHLORIDE 0.9% FLUSH
10.0000 mL | Freq: Once | INTRAVENOUS | Status: AC
  Administered 2018-12-26: 10 mL
  Filled 2018-12-26: qty 10

## 2018-12-26 MED ORDER — SODIUM CHLORIDE 0.9 % IV SOLN
750.0000 mg | Freq: Once | INTRAVENOUS | Status: AC
  Administered 2018-12-26: 750 mg via INTRAVENOUS
  Filled 2018-12-26: qty 75

## 2018-12-26 MED ORDER — SODIUM CHLORIDE 0.9 % IV SOLN
20.0000 mg | Freq: Once | INTRAVENOUS | Status: AC
  Administered 2018-12-26: 20 mg via INTRAVENOUS
  Filled 2018-12-26: qty 2

## 2018-12-26 MED ORDER — SODIUM CHLORIDE 0.9% FLUSH
10.0000 mL | INTRAVENOUS | Status: DC | PRN
  Administered 2018-12-26: 10 mL
  Filled 2018-12-26: qty 10

## 2018-12-26 MED ORDER — HEPARIN SOD (PORK) LOCK FLUSH 100 UNIT/ML IV SOLN
500.0000 [IU] | Freq: Once | INTRAVENOUS | Status: AC | PRN
  Administered 2018-12-26: 500 [IU]
  Filled 2018-12-26: qty 5

## 2018-12-26 MED ORDER — PALONOSETRON HCL INJECTION 0.25 MG/5ML
0.2500 mg | Freq: Once | INTRAVENOUS | Status: AC
  Administered 2018-12-26: 0.25 mg via INTRAVENOUS

## 2018-12-26 MED ORDER — FAMOTIDINE IN NACL 20-0.9 MG/50ML-% IV SOLN: 20.0000 mg | Freq: Once | INTRAVENOUS | Status: DC

## 2018-12-26 MED ORDER — PALONOSETRON HCL INJECTION 0.25 MG/5ML
INTRAVENOUS | Status: AC
  Filled 2018-12-26: qty 5

## 2018-12-26 MED ORDER — SODIUM CHLORIDE 0.9 % IV SOLN
105.0000 mg/m2 | Freq: Once | INTRAVENOUS | Status: AC
  Administered 2018-12-26: 258 mg via INTRAVENOUS
  Filled 2018-12-26: qty 43

## 2018-12-26 MED ORDER — SODIUM CHLORIDE 0.9 % IV SOLN
Freq: Once | INTRAVENOUS | Status: AC
  Administered 2018-12-26: 11:00:00 via INTRAVENOUS
  Filled 2018-12-26: qty 5

## 2018-12-26 NOTE — Assessment & Plan Note (Signed)
Her back pain is stable.  She felt that she is benefiting from physical therapy Her pain medicine has been adjusted by her pain specialist recently

## 2018-12-26 NOTE — Assessment & Plan Note (Signed)
She had excellent response to neoadjuvant chemotherapy The plan will be to complete total of 6 cycles of chemotherapy before surgery She agreed with the plan of care I will coordinate future appointment with GYN oncologist after her cycle 6 of treatment.  If CT scan is indicated, I will order the scan after cycle 6

## 2018-12-26 NOTE — Assessment & Plan Note (Signed)
She has mild steroid-induced hyperglycemia She will continue on reduced oral premedication dexamethasone in the future

## 2018-12-26 NOTE — Progress Notes (Signed)
[place] OFFICE PROGRESS NOTE  Patient Care Team: Ronita Hipps, MD as PCP - General (Family Medicine) Nicholaus Bloom, MD (Anesthesiology)  ASSESSMENT & PLAN:  Endometrial cancer [facility]) She had excellent response to neoadjuvant chemotherapy The plan will be to complete total of 6 cycles of chemotherapy before surgery She agreed with the plan of care I will coordinate future appointment with GYN oncologist after her cycle 6 of treatment.  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  She felt that she is benefiting from physical therapy Her pain medicine has been adjusted by her pain specialist recently  Steroid-induced hyperglycemia She has mild steroid-induced hyperglycemia She will continue on reduced oral premedication dexamethasone in the future   No orders of the defined types were placed in this encounter.   INTERVAL HISTORY: Please see below for problem oriented charting. She returns with her son to receive cycle 5 of chemotherapy Since last time I saw her, she tolerated treatment well She continues to have intermittent joint pain and back pain.  Her pain medicine doctor has adjusted her medicine recently She felt great while undergoing physical therapy treatment She denies worsening neuropathy She has mild constipation that resolved with laxative  SUMMARY OF ONCOLOGIC HISTORY: Oncology History   MSI - Stable MMR - Normal Endometrioid     Endometrial cancer ([place])   07/15/2018 Initial Diagnosis    She noted postmenopausal bleeding for the first time in October 2019. A Pap was done 09/02/18 showign ASC-H. She was referred onto GYN and seen by Dr. Paula Compton     09/09/2018 Procedure    She  underwent endometrial biopsy    09/12/2018 Pathology Results    Endometrial biopsy positive for adenocarcinoma    09/22/2018 Imaging    Ct abdomen and pelvis showed: Diffuse endometrial thickening, consistent with primary endometrial carcinoma.  Pelvic and abdominal retroperitoneal and retrocrural lymphadenopathy, consistent with metastatic disease.  Moderate hepatic steatosis.    10/01/2018 Procedure    Successful placement of a right IJ approach Power Port with ultrasound and fluoroscopic guidance. The catheter is ready for use.    10/02/2018 Tumor Marker    Patient's tumor was tested for the following markers: CA-125 Results of the tumor marker test revealed 578    10/03/2018 -  Chemotherapy    The patient had carboplatin and Taxol with dose adjustment due to neuropathy     10/24/2018 Tumor Marker    Patient's tumor was tested for the following markers: CA-125 Results of the tumor marker test revealed 296     Genetic Testing    Patient has genetic testing done for MSI/MMR. Results revealed MSI stable and MMR normal on Accession [id number].    11/14/2018 Tumor Marker    Patient's tumor was tested for the following markers: CA-125 Results of the tumor marker test revealed 81    11/27/2018 Imaging    1. Significant decrease in abdominal retroperitoneal and bilateral iliac lymphadenopathy since previous study. 2. Significant decrease in abnormal endometrial soft tissue density since prior study. 3. No new or progressive metastatic disease identified.    12/05/2018 Tumor Marker    Patient's tumor was tested for the following markers: CA-125 Results of the tumor marker test revealed 46.3  REVIEW OF SYSTEMS:   Constitutional: Denies fevers, chills or abnormal weight loss Eyes: Denies blurriness of vision Ears, nose, mouth, throat, and face: Denies mucositis or sore throat Respiratory: Denies cough, dyspnea or wheezes  Skin: Denies abnormal skin rashes Lymphatics:  Denies new lymphadenopathy or easy bruising Neurological:Denies numbness, tingling or new weaknesses Behavioral/Psych: Mood is stable, no new changes  All other systems were reviewed with the patient and are negative.  I have reviewed the past medical history, past surgical history, social history and family history with the patient and they are unchanged from previous note.  ALLERGIES:  has No Known Allergies.  MEDICATIONS:  Current Outpatient Medications  Medication Sig Dispense Refill  . OXYCODONE HCL PO Take 20 mg by mouth every 4 (four) hours as needed.    Marland Kitchen dexamethasone (DECADRON) 4 MG tablet Take 3 tabs at the night before and 3 tab the morning of chemotherapy, every 3 weeks, by mouth 60 tablet 0  . diphenhydrAMINE (SOMINEX) 25 MG tablet Take 100 mg by mouth 2 (two) times daily.     Marland Kitchen gabapentin (NEURONTIN) 400 MG capsule Take 800 mg by mouth 3 (three) times daily.   1  . hydrochlorothiazide (MICROZIDE) 12.5 MG capsule Take 12.5 mg by mouth daily.  0  . lidocaine-prilocaine (EMLA) cream Apply to affected area once 30 g 3  . loratadine (CLARITIN) 10 MG tablet Take 10 mg by mouth daily as needed for allergies.    Marland Kitchen losartan (COZAAR) 50 MG tablet Take 50 mg by mouth daily.  2  . ondansetron (ZOFRAN) 8 MG tablet Take 1 tablet (8 mg total) by mouth every 8 (eight) hours as needed for refractory nausea / vomiting. Start on day 3 after chemo. 30 tablet 1  . prochlorperazine (COMPAZINE) 10 MG tablet Take 1 tablet (10 mg total) by mouth every 6 (six) hours as needed (Nausea or vomiting). 30 tablet 1  . zolpidem (AMBIEN) 5 MG tablet Take 1 tablet (5 mg total) by mouth at bedtime as needed for sleep. 30 tablet 0   No current facility-administered medications for this visit.    Facility-Administered Medications Ordered in Other Visits  Medication Dose Route Frequency Provider Last Rate Last Dose  . sodium chloride flush (NS) 0.9 % injection 10 mL  10 mL Intracatheter PRN Alvy Bimler, Mannie Ohlin, MD   10  mL at 12/26/18 1517    PHYSICAL EXAMINATION: ECOG PERFORMANCE STATUS: 1 - Symptomatic but completely ambulatory  Vitals:   12/26/18 0832  BP: (!) 160/87  Pulse: 95  Resp: 18  Temp: 98 F (36.7 C)  SpO2: 98%   Filed Weights   12/26/18 0832  Weight: (!) 302 lb 6.4 oz (137.2 kg)    GENERAL:alert, no distress and comfortable SKIN: skin color, texture, turgor are normal, no rashes or significant lesions EYES: normal, Conjunctiva are pink and non-injected, sclera clear OROPHARYNX:no exudate, no erythema and lips, buccal mucosa, and tongue normal  NECK: supple, thyroid normal size, non-tender, without nodularity LYMPH:  no palpable lymphadenopathy in the cervical, axillary or inguinal LUNGS: clear to auscultation and percussion with normal breathing effort HEART: regular rate & rhythm and no murmurs and no lower extremity edema ABDOMEN:abdomen soft, non-tender and normal bowel sounds Musculoskeletal:no cyanosis of digits and no clubbing  NEURO: alert & oriented x 3 with fluent speech, no focal motor/sensory deficits  LABORATORY DATA:  I have reviewed the data as listed    Component Value Date/Time   NA 141 12/26/2018 0756   K 3.6  12/26/2018 0756   CL 101 12/26/2018 0756   CO2 25 12/26/2018 0756   GLUCOSE 150 (H) 12/26/2018 0756   BUN 17 12/26/2018 0756   CREATININE 0.80 12/26/2018 0756   CALCIUM 9.0 12/26/2018 0756   PROT 7.7 12/26/2018 0756   ALBUMIN 3.6 12/26/2018 0756   AST 13 (L) 12/26/2018 0756   ALT 15 12/26/2018 0756   ALKPHOS 120 12/26/2018 0756   BILITOT 0.5 12/26/2018 0756   GFRNONAA >60 12/26/2018 0756   GFRAA >60 12/26/2018 0756    No results found for: SPEP, UPEP  Lab Results  Component Value Date   WBC 6.6 12/26/2018   NEUTROABS 5.4 12/26/2018   HGB 12.2 12/26/2018   HCT 37.0 12/26/2018   MCV 97.9 12/26/2018   PLT 160 12/26/2018      Chemistry      Component Value Date/Time   NA 141 12/26/2018 0756   K 3.6 12/26/2018 0756   CL 101  12/26/2018 0756   CO2 25 12/26/2018 0756   BUN 17 12/26/2018 0756   CREATININE 0.80 12/26/2018 0756      Component Value Date/Time   CALCIUM 9.0 12/26/2018 0756   ALKPHOS 120 12/26/2018 0756   AST 13 (L) 12/26/2018 0756   ALT 15 12/26/2018 0756   BILITOT 0.5 12/26/2018 0756       RADIOGRAPHIC STUDIES: I have personally reviewed the radiological images as listed and agreed with the findings in the report. Ct Abdomen Pelvis W Contrast  Result Date: 11/27/2018 CLINICAL DATA:  Followup endometrial carcinoma. Undergoing chemotherapy. EXAM: CT ABDOMEN AND PELVIS WITH CONTRAST TECHNIQUE: Multidetector CT imaging of the abdomen and pelvis was performed using the standard protocol following bolus administration of intravenous contrast. CONTRAST:  131m OMNIPAQUE IOHEXOL 300 MG/ML  SOLN COMPARISON:  09/20/2018 FINDINGS: Lower Chest: No acute findings. Hepatobiliary: No hepatic masses identified. Decreased hepatic steatosis noted. A few small left hepatic lobe cysts are stable. No liver masses identified. Prior cholecystectomy. No evidence of biliary obstruction. Pancreas:  No mass or inflammatory changes. Spleen: Within normal limits in size and appearance. Adrenals/Urinary Tract: No masses identified. No evidence of hydronephrosis. Stomach/Bowel: No evidence of obstruction, inflammatory process or abnormal fluid collections. Diverticulosis is seen mainly involving the sigmoid colon, however there is no evidence of diverticulitis. Vascular/Lymphatic: Abdominal retroperitoneal lymphadenopathy is significantly decreased since previous study. Index lymph node in aortocaval space currently measures 10 mm on image 36/2 compared to 17 mm previously. Decreased bilateral iliac lymphadenopathy is also demonstrated, with index lymph node in left common iliac chain measuring 10 mm on image 55/2 compared to 2.3 cm previously. No new or increased lymphadenopathy identified. No evidence of abdominal aortic aneurysm.  Aortic atherosclerosis. Reproductive: Uterus is unremarkable in appearance with significant decrease in abnormal endometrial soft tissue density since prior study. Adnexal regions are unremarkable. Other:  None. Musculoskeletal:  No suspicious bone lesions identified. IMPRESSION: 1. Significant decrease in abdominal retroperitoneal and bilateral iliac lymphadenopathy since previous study. 2. Significant decrease in abnormal endometrial soft tissue density since prior study. 3. No new or progressive metastatic disease identified. Electronically Signed   By: JEarle GellM.D.   On: 11/27/2018 10:10    All questions were answered. The patient knows to call the clinic with any problems, questions or concerns. No barriers to learning was detected.  I spent 15 minutes counseling the patient face to face. The total time spent in the appointment was 20 minutes and more than 50% was on counseling and review of test results  Heath Lark, MD 12/26/2018 4:14 PM

## 2018-12-26 NOTE — Assessment & Plan Note (Signed)
She denies worsening peripheral neuropathy since recent dose reduction We will continue with prior reduced dose Taxol 

## 2018-12-26 NOTE — Patient Instructions (Signed)
   [facility] Discharge Instructions for Patients Receiving Chemotherapy  Today you received the following chemotherapy agents Taxol and Carboplatin   To help prevent nausea and vomiting after your treatment, we encourage you to take your nausea medication as directed.    If you develop nausea and vomiting that is not controlled by your nausea medication, call the clinic.   BELOW ARE SYMPTOMS THAT SHOULD BE REPORTED IMMEDIATELY:  *FEVER GREATER THAN 100.5 F  *CHILLS WITH OR WITHOUT FEVER  NAUSEA AND VOMITING THAT IS NOT CONTROLLED WITH YOUR NAUSEA MEDICATION  *UNUSUAL SHORTNESS OF BREATH  *UNUSUAL BRUISING OR BLEEDING  TENDERNESS IN MOUTH AND THROAT WITH OR WITHOUT PRESENCE OF ULCERS  *URINARY PROBLEMS  *BOWEL PROBLEMS  UNUSUAL RASH Items with * indicate a potential emergency and should be followed up as soon as possible.  Feel free to call the clinic should you have any questions or concerns. The clinic phone number is [phone].  Please show the CHEMO ALERT CARD at check-in to the Emergency Department and triage nurse.   

## 2018-12-29 ENCOUNTER — Telehealth: Payer: Self-pay

## 2018-12-29 ENCOUNTER — Other Ambulatory Visit: Payer: Self-pay | Admitting: Hematology and Oncology

## 2018-12-29 ENCOUNTER — Ambulatory Visit: Payer: Medicare Other | Admitting: Obstetrics

## 2018-12-29 DIAGNOSIS — C541 Malignant neoplasm of endometrium: Secondary | ICD-10-CM

## 2018-12-29 LAB — CA 125: CANCER ANTIGEN (CA) 125: 38.2 U/mL — AB (ref 0.0–38.1)

## 2018-12-29 NOTE — Telephone Encounter (Signed)
Called and given below message. She verbalized understanding. 

## 2018-12-29 NOTE — Telephone Encounter (Signed)
-----   Message from Heath Lark, MD sent at 12/29/2018 12:50 PM EDT ----- Regarding: CA-125 Pls let her know result is good ----- Message ----- From: Interface, Lab In [facility] Sent: 12/26/2018   8:29 AM EDT To: Heath Lark, MD

## 2019-01-06 ENCOUNTER — Telehealth: Payer: Self-pay | Admitting: Oncology

## 2019-01-06 NOTE — Telephone Encounter (Signed)
Kathryn [name] with CT appointment on 02/02/19 at 1 pm (npo p MN, drink contrast at 11 and 12) and appointment with Dr. Denman George on 02/04/19 at 12:15.  She verbalized understanding and agreement.

## 2019-01-13 ENCOUNTER — Ambulatory Visit: Payer: Medicare Other

## 2019-01-16 ENCOUNTER — Inpatient Hospital Stay: Payer: Medicare Other

## 2019-01-16 ENCOUNTER — Encounter: Payer: Self-pay | Admitting: Hematology and Oncology

## 2019-01-16 ENCOUNTER — Inpatient Hospital Stay: Payer: Medicare Other | Attending: Hematology and Oncology

## 2019-01-16 ENCOUNTER — Inpatient Hospital Stay (HOSPITAL_BASED_OUTPATIENT_CLINIC_OR_DEPARTMENT_OTHER): Payer: Medicare Other | Admitting: Hematology and Oncology

## 2019-01-16 ENCOUNTER — Other Ambulatory Visit: Payer: Self-pay

## 2019-01-16 DIAGNOSIS — R739 Hyperglycemia, unspecified: Secondary | ICD-10-CM | POA: Diagnosis not present

## 2019-01-16 DIAGNOSIS — T380X5A Adverse effect of glucocorticoids and synthetic analogues, initial encounter: Secondary | ICD-10-CM

## 2019-01-16 DIAGNOSIS — C541 Malignant neoplasm of endometrium: Secondary | ICD-10-CM | POA: Insufficient documentation

## 2019-01-16 DIAGNOSIS — E876 Hypokalemia: Secondary | ICD-10-CM | POA: Insufficient documentation

## 2019-01-16 DIAGNOSIS — G629 Polyneuropathy, unspecified: Secondary | ICD-10-CM | POA: Diagnosis not present

## 2019-01-16 DIAGNOSIS — Z5111 Encounter for antineoplastic chemotherapy: Secondary | ICD-10-CM | POA: Diagnosis not present

## 2019-01-16 DIAGNOSIS — G63 Polyneuropathy in diseases classified elsewhere: Secondary | ICD-10-CM | POA: Insufficient documentation

## 2019-01-16 DIAGNOSIS — Z79899 Other long term (current) drug therapy: Secondary | ICD-10-CM | POA: Diagnosis not present

## 2019-01-16 LAB — CMP (CANCER CENTER ONLY)
ALT: 19 U/L (ref 0–44)
AST: 16 U/L (ref 15–41)
Albumin: 3.8 g/dL (ref 3.5–5.0)
Alkaline Phosphatase: 121 U/L (ref 38–126)
Anion gap: 12 (ref 5–15)
BUN: 15 mg/dL (ref 8–23)
CO2: 26 mmol/L (ref 22–32)
Calcium: 8.9 mg/dL (ref 8.9–10.3)
Chloride: 101 mmol/L (ref 98–111)
Creatinine: 0.82 mg/dL (ref 0.44–1.00)
GFR, Est AFR Am: >60 mL/min (ref >60)
GFR, Estimated: >60 mL/min (ref >60)
Glucose, Bld: 198 mg/dL — ABNORMAL HIGH (ref 70–99)
Potassium: 3.3 mmol/L — ABNORMAL LOW (ref 3.5–5.1)
Sodium: 139 mmol/L (ref 135–145)
Total Bilirubin: 0.4 mg/dL (ref 0.3–1.2)
Total Protein: 7.6 g/dL (ref 6.5–8.1)

## 2019-01-16 LAB — CBC WITH DIFFERENTIAL (CANCER CENTER ONLY)
Abs Immature Granulocytes: 0.04 10*3/uL (ref 0.00–0.07)
Basophils Absolute: 0 10*3/uL (ref 0.0–0.1)
Basophils Relative: 0 %
Eosinophils Absolute: 0 10*3/uL (ref 0.0–0.5)
Eosinophils Relative: 0 %
HCT: 34.1 % — ABNORMAL LOW (ref 36.0–46.0)
Hemoglobin: 11.2 g/dL — ABNORMAL LOW (ref 12.0–15.0)
Immature Granulocytes: 1 %
Lymphocytes Relative: 13 %
Lymphs Abs: 1 10*3/uL (ref 0.7–4.0)
MCH: 34 pg (ref 26.0–34.0)
MCHC: 32.8 g/dL (ref 30.0–36.0)
MCV: 103.6 fL — ABNORMAL HIGH (ref 80.0–100.0)
Monocytes Absolute: 0.2 10*3/uL (ref 0.1–1.0)
Monocytes Relative: 3 %
Neutro Abs: 6.1 10*3/uL (ref 1.7–7.7)
Neutrophils Relative %: 83 %
Platelet Count: 137 10*3/uL — ABNORMAL LOW (ref 150–400)
RBC: 3.29 MIL/uL — ABNORMAL LOW (ref 3.87–5.11)
RDW: 19.9 % — ABNORMAL HIGH (ref 11.5–15.5)
WBC Count: 7.3 10*3/uL (ref 4.0–10.5)
nRBC: 0 % (ref 0.0–0.2)

## 2019-01-16 MED ORDER — SODIUM CHLORIDE 0.9 % IV SOLN
750.0000 mg | Freq: Once | INTRAVENOUS | Status: AC
  Administered 2019-01-16: 750 mg via INTRAVENOUS
  Filled 2019-01-16: qty 75

## 2019-01-16 MED ORDER — PALONOSETRON HCL INJECTION 0.25 MG/5ML
0.2500 mg | Freq: Once | INTRAVENOUS | Status: AC
  Administered 2019-01-16: 0.25 mg via INTRAVENOUS

## 2019-01-16 MED ORDER — HEPARIN SOD (PORK) LOCK FLUSH 100 UNIT/ML IV SOLN
500.0000 [IU] | Freq: Once | INTRAVENOUS | Status: AC | PRN
  Administered 2019-01-16: 16:00:00 500 [IU]
  Filled 2019-01-16: qty 5

## 2019-01-16 MED ORDER — SODIUM CHLORIDE 0.9 % IV SOLN
20.0000 mg | Freq: Once | INTRAVENOUS | Status: AC
  Administered 2019-01-16: 20 mg via INTRAVENOUS
  Filled 2019-01-16: qty 2

## 2019-01-16 MED ORDER — SODIUM CHLORIDE 0.9 % IV SOLN
Freq: Once | INTRAVENOUS | Status: AC
  Administered 2019-01-16: 11:00:00 via INTRAVENOUS
  Filled 2019-01-16: qty 250

## 2019-01-16 MED ORDER — SODIUM CHLORIDE 0.9 % IV SOLN
105.0000 mg/m2 | Freq: Once | INTRAVENOUS | Status: AC
  Administered 2019-01-16: 258 mg via INTRAVENOUS
  Filled 2019-01-16: qty 43

## 2019-01-16 MED ORDER — SODIUM CHLORIDE 0.9% FLUSH
10.0000 mL | Freq: Once | INTRAVENOUS | Status: AC
  Administered 2019-01-16: 10 mL
  Filled 2019-01-16: qty 10

## 2019-01-16 MED ORDER — SODIUM CHLORIDE 0.9 % IV SOLN
Freq: Once | INTRAVENOUS | Status: AC
  Administered 2019-01-16: 11:00:00 via INTRAVENOUS
  Filled 2019-01-16: qty 5

## 2019-01-16 MED ORDER — SODIUM CHLORIDE 0.9% FLUSH
10.0000 mL | INTRAVENOUS | Status: DC | PRN
  Administered 2019-01-16: 16:00:00 10 mL
  Filled 2019-01-16: qty 10

## 2019-01-16 MED ORDER — FAMOTIDINE IN NACL 20-0.9 MG/50ML-% IV SOLN: 20.0000 mg | Freq: Once | INTRAVENOUS | Status: DC

## 2019-01-16 MED ORDER — PALONOSETRON HCL INJECTION 0.25 MG/5ML
INTRAVENOUS | Status: AC
  Filled 2019-01-16: qty 5

## 2019-01-16 MED FILL — PROCHLORPERAZINE 10 MG TAB: 10 | 7 days supply | Qty: 30 | Fill #1

## 2019-01-16 NOTE — Progress Notes (Signed)
01/16/19  Confirmed diphenhydramine taken at home at 0700 before arrival for chemotherapy.  Wendall Mola RN/Miklos Bidinger Ronnald Ramp, PharmD

## 2019-01-16 NOTE — Patient Instructions (Signed)
   [facility] Discharge Instructions for Patients Receiving Chemotherapy  Today you received the following chemotherapy agents Taxol and Carboplatin   To help prevent nausea and vomiting after your treatment, we encourage you to take your nausea medication as directed.    If you develop nausea and vomiting that is not controlled by your nausea medication, call the clinic.   BELOW ARE SYMPTOMS THAT SHOULD BE REPORTED IMMEDIATELY:  *FEVER GREATER THAN 100.5 F  *CHILLS WITH OR WITHOUT FEVER  NAUSEA AND VOMITING THAT IS NOT CONTROLLED WITH YOUR NAUSEA MEDICATION  *UNUSUAL SHORTNESS OF BREATH  *UNUSUAL BRUISING OR BLEEDING  TENDERNESS IN MOUTH AND THROAT WITH OR WITHOUT PRESENCE OF ULCERS  *URINARY PROBLEMS  *BOWEL PROBLEMS  UNUSUAL RASH Items with * indicate a potential emergency and should be followed up as soon as possible.  Feel free to call the clinic should you have any questions or concerns. The clinic phone number is [phone].  Please show the CHEMO ALERT CARD at check-in to the Emergency Department and triage nurse.   

## 2019-01-16 NOTE — Assessment & Plan Note (Signed)
She had excellent response to neoadjuvant chemotherapy with near normalization of tumor marker The plan will be to complete total of 6 cycles of chemotherapy before surgery She agreed with the plan of care She has appointment scheduled for CT scan in 2 weeks and surgical appointment at the end of the month

## 2019-01-16 NOTE — Assessment & Plan Note (Signed)
She denies worsening peripheral neuropathy since recent dose reduction We will continue with prior reduced dose Taxol

## 2019-01-16 NOTE — Assessment & Plan Note (Signed)
She has mild steroid-induced hyperglycemia We will observe only.  We discussed dietary modification

## 2019-01-16 NOTE — Progress Notes (Signed)
[place] OFFICE PROGRESS NOTE  Patient Care Team: Ronita Hipps, MD as PCP - General (Family Medicine) Nicholaus Bloom, MD (Anesthesiology)  ASSESSMENT & PLAN:  Endometrial cancer [facility]) She had excellent response to neoadjuvant chemotherapy with near normalization of tumor marker The plan will be to complete total of 6 cycles of chemotherapy before surgery She agreed with the plan of care She has appointment scheduled for CT scan in 2 weeks and surgical appointment at the end of the month  Steroid-induced hyperglycemia She has mild steroid-induced hyperglycemia We will observe only.  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   INTERVAL HISTORY: Please see below for problem oriented charting. She returns for further follow-up and for cycle 6 of chemotherapy  she denies significant exacerbation of peripheral neuropathy It comes and goes Her chronic back pain is stable She denies recent falls No significant nausea, vomiting or changes in bowel habits She denies recent infection, fever or chills She has no recent vaginal bleeding  SUMMARY OF ONCOLOGIC HISTORY: Oncology History   MSI - Stable MMR - Normal Endometrioid     Endometrial cancer ([place])   07/15/2018 Initial Diagnosis    She noted postmenopausal bleeding for the first time in October 2019. A Pap was done 09/02/18 showign ASC-H. She was referred onto GYN and seen by Dr. Paula Compton     09/09/2018 Procedure    She underwent endometrial biopsy    09/12/2018 Pathology Results    Endometrial biopsy  positive for adenocarcinoma    09/22/2018 Imaging    Ct abdomen and pelvis showed: Diffuse endometrial thickening, consistent with primary endometrial carcinoma.  Pelvic and abdominal retroperitoneal and retrocrural lymphadenopathy, consistent with metastatic disease.  Moderate hepatic steatosis.    10/01/2018 Procedure    Successful placement of a right IJ approach Power Port with ultrasound and fluoroscopic guidance. The catheter is ready for use.    10/02/2018 Tumor Marker    Patient's tumor was tested for the following markers: CA-125 Results of the tumor marker test revealed 578    10/03/2018 -  Chemotherapy    The patient had carboplatin and Taxol with dose adjustment due to neuropathy     10/24/2018 Tumor Marker    Patient's tumor was tested for the following markers: CA-125 Results of the tumor marker test revealed 296     Genetic Testing    Patient has genetic testing done for MSI/MMR. Results revealed MSI stable and MMR normal on Accession [id number].    11/14/2018 Tumor Marker    Patient's tumor was tested for the following markers: CA-125 Results of the tumor marker test revealed 81    11/27/2018 Imaging    1. Significant decrease in abdominal retroperitoneal and bilateral iliac lymphadenopathy since previous study. 2. Significant decrease in abnormal endometrial soft tissue density since prior study. 3. No new or progressive metastatic disease identified.    12/05/2018 Tumor Marker    Patient's tumor was tested for the following markers: CA-125 Results of the tumor marker test revealed 46.3    12/26/2018 Tumor Marker    Patient's tumor was tested for the following markers: CA-125  Results of the tumor marker test revealed 38.2     REVIEW OF SYSTEMS:   Constitutional: Denies fevers, chills or abnormal weight loss Eyes: Denies blurriness of vision Ears, nose, mouth, throat, and face: Denies mucositis or sore throat Respiratory: Denies cough, dyspnea or  wheezes Cardiovascular: Denies palpitation, chest discomfort or lower extremity swelling Gastrointestinal:  Denies nausea, heartburn or change in bowel habits Skin: Denies abnormal skin rashes Lymphatics: Denies new lymphadenopathy or easy bruising Behavioral/Psych: Mood is stable, no new changes  All other systems were reviewed with the patient and are negative.  I have reviewed the past medical history, past surgical history, social history and family history with the patient and they are unchanged from previous note.  ALLERGIES:  has No Known Allergies.  MEDICATIONS:  Current Outpatient Medications  Medication Sig Dispense Refill  . dexamethasone (DECADRON) 4 MG tablet Take 3 tabs at the night before and 3 tab the morning of chemotherapy, every 3 weeks, by mouth 60 tablet 0  . diphenhydrAMINE (SOMINEX) 25 MG tablet Take 100 mg by mouth 2 (two) times daily.     Marland Kitchen gabapentin (NEURONTIN) 400 MG capsule Take 800 mg by mouth 3 (three) times daily.   1  . hydrochlorothiazide (MICROZIDE) 12.5 MG capsule Take 12.5 mg by mouth daily.  0  . lidocaine-prilocaine (EMLA) cream Apply to affected area once 30 g 3  . loratadine (CLARITIN) 10 MG tablet Take 10 mg by mouth daily as needed for allergies.    Marland Kitchen losartan (COZAAR) 50 MG tablet Take 50 mg by mouth daily.  2  . ondansetron (ZOFRAN) 8 MG tablet Take 1 tablet (8 mg total) by mouth every 8 (eight) hours as needed for refractory nausea / vomiting. Start on day 3 after chemo. 30 tablet 1  . OXYCODONE HCL PO Take 20 mg by mouth every 4 (four) hours as needed.    . prochlorperazine (COMPAZINE) 10 MG tablet Take 1 tablet (10 mg total) by mouth every 6 (six) hours as needed (Nausea or vomiting). 30 tablet 1  . zolpidem (AMBIEN) 5 MG tablet Take 1 tablet (5 mg total) by mouth at bedtime as needed for sleep. 30 tablet 0   No current facility-administered medications for this visit.    Facility-Administered Medications Ordered in Other Visits   Medication Dose Route Frequency Provider Last Rate Last Dose  . CARBOplatin (PARAPLATIN) 750 mg in sodium chloride 0.9 % 250 mL chemo infusion  750 mg Intravenous Once Alvy Bimler, Tranae Laramie, MD      . famotidine (PEPCID) IVPB 20 mg premix  20 mg Intravenous Once Alvy Bimler, Jennings Corado, MD      . fosaprepitant (EMEND) 150 mg, dexamethasone (DECADRON) 12 mg in sodium chloride 0.9 % 145 mL IVPB   Intravenous Once Alvy Bimler, Nita Whitmire, MD      . heparin lock flush 100 unit/mL  500 Units Intracatheter Once PRN Alvy Bimler, Tatiana Courter, MD      . PACLitaxel (TAXOL) 258 mg in sodium chloride 0.9 % 250 mL chemo infusion (> 13m/m2)  105 mg/m2 (Treatment Plan Recorded) Intravenous Once GAlvy Bimler Alquan Morrish, MD      . palonosetron (ALOXI) injection 0.25 mg  0.25 mg Intravenous Once Quinntin Malter, MD      . sodium chloride flush (NS) 0.9 % injection 10 mL  10 mL Intracatheter PRN GAlvy Bimler Elaina Cara, MD        PHYSICAL EXAMINATION: ECOG PERFORMANCE STATUS: 2 - Symptomatic, <50% confined to bed  Vitals:   01/16/19 1004  BP: (!) 147/78  Pulse: 99  Resp: 18  Temp: 98.4 F (36.9 C)  SpO2: 99%   Filed Weights   01/16/19 1004  Weight: (!) 302 lb 6.4 oz (137.2 kg)    GENERAL:alert, no distress and comfortable SKIN: skin color, texture, turgor are normal, no rashes or significant lesions EYES: normal, Conjunctiva are pink and non-injected, sclera clear OROPHARYNX:no exudate, no erythema and lips, buccal mucosa, and tongue normal  NECK: supple, thyroid normal size, non-tender, without nodularity LYMPH:  no palpable lymphadenopathy in the cervical, axillary or inguinal LUNGS: clear to auscultation and percussion with normal breathing effort HEART: regular rate & rhythm and no murmurs and no lower extremity edema ABDOMEN:abdomen soft, non-tender and normal bowel sounds Musculoskeletal:no cyanosis of digits and no clubbing  NEURO: alert & oriented x 3 with fluent speech, no focal motor/sensory deficits  LABORATORY DATA:  I have reviewed the data as listed     Component Value Date/Time   NA 139 01/16/2019 0840   K 3.3 (L) 01/16/2019 0840   CL 101 01/16/2019 0840   CO2 26 01/16/2019 0840   GLUCOSE 198 (H) 01/16/2019 0840   BUN 15 01/16/2019 0840   CREATININE 0.82 01/16/2019 0840   CALCIUM 8.9 01/16/2019 0840   PROT 7.6 01/16/2019 0840   ALBUMIN 3.8 01/16/2019 0840   AST 16 01/16/2019 0840   ALT 19 01/16/2019 0840   ALKPHOS 121 01/16/2019 0840   BILITOT 0.4 01/16/2019 0840   GFRNONAA >60 01/16/2019 0840   GFRAA >60 01/16/2019 0840    No results found for: SPEP, UPEP  Lab Results  Component Value Date   WBC 7.3 01/16/2019   NEUTROABS 6.1 01/16/2019   HGB 11.2 (L) 01/16/2019   HCT 34.1 (L) 01/16/2019   MCV 103.6 (H) 01/16/2019   PLT 137 (L) 01/16/2019      Chemistry      Component Value Date/Time   NA 139 01/16/2019 0840   K 3.3 (L) 01/16/2019 0840   CL 101 01/16/2019 0840   CO2 26 01/16/2019 0840   BUN 15 01/16/2019 0840   CREATININE 0.82 01/16/2019 0840      Component Value Date/Time   CALCIUM 8.9 01/16/2019 0840   ALKPHOS 121 01/16/2019 0840   AST 16 01/16/2019 0840   ALT 19 01/16/2019 0840   BILITOT 0.4 01/16/2019 0840      All questions were answered. The patient knows to call the clinic with any problems, questions or concerns. No barriers to learning was detected.  I spent 15 minutes counseling the patient face to face. The total time spent in the appointment was 20 minutes and more than 50% was on counseling and review of test results  Heath Lark, MD 01/16/2019 10:35 AM

## 2019-01-16 NOTE — Assessment & Plan Note (Signed)
She has mild hypokalemia secondary to diuretic therapy She is not symptomatic We will proceed with treatment We discussed potassium rich diet

## 2019-01-17 LAB — CA 125: Cancer Antigen (CA) 125: 30.8 U/mL (ref 0.0–38.1)

## 2019-01-19 ENCOUNTER — Telehealth: Payer: Self-pay

## 2019-01-19 NOTE — Telephone Encounter (Signed)
Called pt to see how she is faring during [place] pandemic. She just finished last chemo on 01/16/19 and reports still experiencing typical fatigue with little appetite. Sh has been progressing herself with HEP as able between chemos and plans to get back to HEP in next few days as effect from last chemo begin to ease off and she is able to eat again. Unable to walk outside at this time due to pollen so she has been walking more around house. Does not require more HEP or a Telehealth visit at this time but she is hopeful to have hysterectomy (as was original plan) beginning of May and to resume PT after.

## 2019-01-21 ENCOUNTER — Telehealth: Payer: Self-pay | Admitting: Oncology

## 2019-01-21 ENCOUNTER — Telehealth: Payer: Self-pay | Admitting: *Deleted

## 2019-01-21 NOTE — Telephone Encounter (Signed)
Called and spoke with the patient regarding her appt for 4/22. Per hospital policy due to [id number]; all surgeries to be postpones until June. Called and moved her appt from 4/22 to 5/20.

## 2019-01-21 NOTE — Telephone Encounter (Signed)
Kathryn [name] called and asked what her CA 125 results were.  Informed her that they are 30.8.  She verbalized agreement.

## 2019-01-27 ENCOUNTER — Telehealth: Payer: Self-pay | Admitting: Oncology

## 2019-01-27 NOTE — Telephone Encounter (Signed)
Ronan called back and was notified of the CT scan at [facility].  She verbalized understanding and agreement.

## 2019-01-27 NOTE — Telephone Encounter (Signed)
Left a message for Naylani advising her of CT appointment at [facility] on 02/02/19.  Requested a return call.

## 2019-02-02 ENCOUNTER — Ambulatory Visit (HOSPITAL_COMMUNITY)
Admission: RE | Admit: 2019-02-02 | Discharge: 2019-02-02 | Disposition: A | Discharge status: Home / Self Care | Payer: Medicare Other | Source: Ambulatory Visit | Attending: Hematology and Oncology | Admitting: Hematology and Oncology

## 2019-02-02 ENCOUNTER — Ambulatory Visit (HOSPITAL_COMMUNITY): Admission: RE | Admit: 2019-02-02 | Payer: Medicare Other | Source: Ambulatory Visit

## 2019-02-02 ENCOUNTER — Other Ambulatory Visit: Payer: Self-pay

## 2019-02-02 DIAGNOSIS — C541 Malignant neoplasm of endometrium: Secondary | ICD-10-CM | POA: Diagnosis not present

## 2019-02-02 MED ORDER — IOHEXOL 300 MG/ML  SOLN
100.0000 mL | Freq: Once | INTRAMUSCULAR | Status: AC | PRN
  Administered 2019-02-02: 16:00:00 100 mL via INTRAVENOUS

## 2019-02-02 MED ORDER — HEPARIN SOD (PORK) LOCK FLUSH 100 UNIT/ML IV SOLN
500.0000 [IU] | INTRAVENOUS | Status: AC | PRN
  Administered 2019-02-02: 17:00:00 500 [IU]

## 2019-02-03 ENCOUNTER — Telehealth: Payer: Self-pay | Admitting: Oncology

## 2019-02-03 NOTE — Telephone Encounter (Signed)
Called Kathryn [name] and advised her of good CT results per Dr. Alvy Bimler.  She verbalized agreement and understanding.

## 2019-02-04 ENCOUNTER — Ambulatory Visit: Payer: Medicare Other | Admitting: Gynecologic Oncology

## 2019-02-05 ENCOUNTER — Other Ambulatory Visit: Payer: Self-pay | Admitting: Gynecologic Oncology

## 2019-02-05 DIAGNOSIS — C541 Malignant neoplasm of endometrium: Secondary | ICD-10-CM

## 2019-02-06 ENCOUNTER — Encounter: Payer: Self-pay | Admitting: General Practice

## 2019-02-06 NOTE — Progress Notes (Signed)
[place] Team contacted patient to assess for food insecurity and other psychosocial needs during current COVID19 pandemic.  Family helps support her w any needs and check on her frequently.  Can visit w family who remain outside.    Patient/family expressed no needs at this time.  Support Team member encouraged patient to call if changes occur or they have any other questions/concerns.   Beverely Pace, [place]

## 2019-02-09 NOTE — Progress Notes (Signed)
SPOKE W/  _self     SCREENING SYMPTOMS OF COVID 19:   COUGH--no  RUNNY NOSE---no   SORE THROAT---no  NASAL CONGESTION----no  SNEEZING----no  SHORTNESS OF BREATH---no  DIFFICULTY BREATHING---no  TEMP >100.4---no--  UNEXPLAINED BODY ACHES----no--   HAVE YOU OR ANY FAMILY MEMBER TRAVELLED PAST 14 DAYS OUT OF THE   COUNTY---no STATE----no COUNTRY---no-  HAVE YOU OR ANY FAMILY MEMBER BEEN EXPOSED TO ANYONE WITH COVID 19? no

## 2019-02-09 NOTE — Patient Instructions (Addendum)
[place]    Your procedure is scheduled on:02/12/19    Report to [facility] go straight to short stay at 5:30 am     Call this number if you have problems the morning of surgery [phone]   Eat a light diet the day before surgery.  Examples including soups, broths, toast, yogurt, mashed potatoes.  Things to avoid include carbonated beverages (fizzy beverages), raw fruits and raw vegetables, or beans.   If your bowels are filled with gas, your surgeon will have difficulty visualizing your pelvic organs which increases your surgical risks.   Remember: Do not eat food  :After Midnight.                        BRUSH YOUR TEETH MORNING OF SURGERY AND RINSE YOUR MOUTH OUT, NO CHEWING GUM CANDY OR MINTS.     Take these medicines the morning of surgery with A SIP OF WATER:  Zyrtec or Claritin,Benadryl, gabpentan                                You may not have any metal on your body including hair pins and              piercings  Do not wear jewelry, make-up, lotions, powders or perfumes, deodorant             Do not wear nail polish.  Do not shave  48 hours prior to surgery.               Do not bring valuables to the hospital. [place].  Contacts, dentures or bridgework may not be worn into surgery. .   Name and phone number of your driver: Kathryn [name] [phone].                                              [place] - Preparing for Surgery Before surgery, you can play an important role.  Because skin is not sterile, your skin needs to be as free of germs as possible.  You can reduce the number of germs on your skin by washing with CHG (chlorahexidine gluconate) soap before surgery.  CHG is an antiseptic cleaner which kills germs and bonds with the skin to continue killing germs even after washing. Please DO NOT use if you have an allergy to CHG or antibacterial soaps.  If your skin becomes  reddened/irritated stop using the CHG and inform your nurse when you arrive at Short Stay. Do not shave (including legs and underarms) for at least 48 hours prior to the first CHG shower.  You may shave your face/neck. Please follow these instructions carefully:  1.  Shower with CHG Soap the night before surgery and the  morning of Surgery.  2.  If you choose to wash your hair, wash your hair first as usual with your  normal  shampoo.  3.  After you shampoo, rinse your hair and body thoroughly to remove the  shampoo.  4.  Use CHG as you would any other liquid soap.  You can apply chg directly  to the skin and wash                       Gently with a scrungie or clean washcloth.  5.  Apply the CHG Soap to your body ONLY FROM THE NECK DOWN.   Do not use on face/ open                           Wound or open sores. Avoid contact with eyes, ears mouth and genitals (private parts).                       Wash face,  Genitals (private parts) with your normal soap.             6.  Wash thoroughly, paying special attention to the area where your surgery  will be performed.  7.  Thoroughly rinse your body with warm water from the neck down.  8.  DO NOT shower/wash with your normal soap after using and rinsing off  the CHG Soap.                9.  Pat yourself dry with a clean towel.            10.  Wear clean pajamas.            11.  Place clean sheets on your bed the night of your first shower and do not  sleep with pets.  Day of Surgery : Do not apply any lotions/deodorants the morning of surgery.  Please wear clean clothes to the hospital/surgery center.  FAILURE TO FOLLOW THESE INSTRUCTIONS MAY RESULT IN THE CANCELLATION OF YOUR SURGERY PATIENT SIGNATURE_________________________________  NURSE SIGNATURE__________________________________  ________________________________________________________________________   Kathryn [name]  An incentive spirometer  is a tool that can help keep your lungs clear and active. This tool measures how well you are filling your lungs with each breath. Taking long deep breaths may help reverse or decrease the chance of developing breathing (pulmonary) problems (especially infection) following:  A long period of time when you are unable to move or be active. BEFORE THE PROCEDURE   If the spirometer includes an indicator to show your best effort, your nurse or respiratory therapist will set it to a desired goal.  If possible, sit up straight or lean slightly forward. Try not to slouch.  Hold the incentive spirometer in an upright position. INSTRUCTIONS FOR USE  1. Sit on the edge of your bed if possible, or sit up as far as you can in bed or on a chair. 2. Hold the incentive spirometer in an upright position. 3. Breathe out normally. 4. Place the mouthpiece in your mouth and seal your lips tightly around it. 5. Breathe in slowly and as deeply as possible, raising the piston or the ball toward the top of the column. 6. Hold your breath for 3-5 seconds or for as long as possible. Allow the piston or ball to fall to the bottom of the column. 7. Remove the mouthpiece from your mouth and breathe out normally. 8. Rest for a few seconds and repeat Steps 1 through 7 at least 10 times every 1-2 hours when you are awake. Take your time and take a few normal breaths between deep breaths. 9. The spirometer may include an indicator to  show your best effort. Use the indicator as a goal to work toward during each repetition. 10. After each set of 10 deep breaths, practice coughing to be sure your lungs are clear. If you have an incision (the cut made at the time of surgery), support your incision when coughing by placing a pillow or rolled up towels firmly against it. Once you are able to get out of bed, walk around indoors and cough well. You may stop using the incentive spirometer when instructed by your caregiver.  RISKS AND  COMPLICATIONS  Take your time so you do not get dizzy or light-headed.  If you are in pain, you may need to take or ask for pain medication before doing incentive spirometry. It is harder to take a deep breath if you are having pain. AFTER USE  Rest and breathe slowly and easily.  It can be helpful to keep track of a log of your progress. Your caregiver can provide you with a simple table to help with this. If you are using the spirometer at home, follow these instructions: [place] IF:   You are having difficultly using the spirometer.  You have trouble using the spirometer as often as instructed.  Your pain medication is not giving enough relief while using the spirometer.  You develop fever of 100.5 F (38.1 C) or higher. SEEK IMMEDIATE MEDICAL CARE IF:   You cough up bloody sputum that had not been present before.  You develop fever of 102 F (38.9 C) or greater.  You develop worsening pain at or near the incision site. MAKE SURE YOU:   Understand these instructions.  Will watch your condition.  Will get help right away if you are not doing well or get worse. Document Released: 02/11/2007 Document Revised: 12/24/2011 Document Reviewed: 04/14/2007 Exit[employer] Patient Information 2014 [employer], [place].   ________________________________________________________________________  WHAT IS A BLOOD TRANSFUSION? Blood Transfusion Information  A transfusion is the replacement of blood or some of its parts. Blood is made up of multiple cells which provide different functions.  Red blood cells carry oxygen and are used for blood loss replacement.  White blood cells fight against infection.  Platelets control bleeding.  Plasma helps clot blood.  Other blood products are available for specialized needs, such as hemophilia or other clotting disorders. BEFORE THE TRANSFUSION  Who gives blood for transfusions?   Healthy volunteers who are fully evaluated to make sure  their blood is safe. This is blood bank blood. Transfusion therapy is the safest it has ever been in the practice of medicine. Before blood is taken from a donor, a complete history is taken to make sure that person has no history of diseases nor engages in risky social behavior (examples are intravenous drug use or sexual activity with multiple partners). The donor's travel history is screened to minimize risk of transmitting infections, such as malaria. The donated blood is tested for signs of infectious diseases, such as HIV and hepatitis. The blood is then tested to be sure it is compatible with you in order to minimize the chance of a transfusion reaction. If you or a relative donates blood, this is often done in anticipation of surgery and is not appropriate for emergency situations. It takes many days to process the donated blood. RISKS AND COMPLICATIONS Although transfusion therapy is very safe and saves many lives, the main dangers of transfusion include:   Getting an infectious disease.  Developing a transfusion reaction. This is an allergic reaction  to something in the blood you were given. Every precaution is taken to prevent this. The decision to have a blood transfusion has been considered carefully by your caregiver before blood is given. Blood is not given unless the benefits outweigh the risks. AFTER THE TRANSFUSION  Right after receiving a blood transfusion, you will usually feel much better and more energetic. This is especially true if your red blood cells have gotten low (anemic). The transfusion raises the level of the red blood cells which carry oxygen, and this usually causes an energy increase.  The nurse administering the transfusion will monitor you carefully for complications. HOME CARE INSTRUCTIONS  No special instructions are needed after a transfusion. You may find your energy is better. Speak with your caregiver about any limitations on activity for underlying diseases  you may have. SEEK MEDICAL CARE IF:   Your condition is not improving after your transfusion.  You develop redness or irritation at the intravenous (IV) site. SEEK IMMEDIATE MEDICAL CARE IF:  Any of the following symptoms occur over the next 12 hours:  Shaking chills.  You have a temperature by mouth above 102 F (38.9 C), not controlled by medicine.  Chest, back, or muscle pain.  People around you feel you are not acting correctly or are confused.  Shortness of breath or difficulty breathing.  Dizziness and fainting.  You get a rash or develop hives.  You have a decrease in urine output.  Your urine turns a dark color or changes to pink, red, or brown. Any of the following symptoms occur over the next 10 days:  You have a temperature by mouth above 102 F (38.9 C), not controlled by medicine.  Shortness of breath.  Weakness after normal activity.  The white part of the eye turns yellow (jaundice).  You have a decrease in the amount of urine or are urinating less often.  Your urine turns a dark color or changes to pink, red, or brown. Document Released: 09/28/2000 Document Revised: 12/24/2011 Document Reviewed: 05/17/2008 [facility] Patient Information 2014 [place], [place].  _______________________________________________________________________

## 2019-02-10 ENCOUNTER — Other Ambulatory Visit: Payer: Self-pay

## 2019-02-10 ENCOUNTER — Telehealth: Payer: Self-pay | Admitting: Oncology

## 2019-02-10 ENCOUNTER — Inpatient Hospital Stay (HOSPITAL_BASED_OUTPATIENT_CLINIC_OR_DEPARTMENT_OTHER): Payer: Medicare Other | Admitting: Gynecologic Oncology

## 2019-02-10 ENCOUNTER — Encounter: Payer: Self-pay | Admitting: Gynecologic Oncology

## 2019-02-10 ENCOUNTER — Encounter (HOSPITAL_COMMUNITY): Payer: Self-pay

## 2019-02-10 ENCOUNTER — Encounter (HOSPITAL_COMMUNITY)
Admission: RE | Admit: 2019-02-10 | Discharge: 2019-02-10 | Disposition: A | Discharge status: Home / Self Care | Payer: Medicare Other | Source: Ambulatory Visit | Attending: Gynecologic Oncology | Admitting: Gynecologic Oncology

## 2019-02-10 VITALS — BP 145/81 | HR 85 | Temp 98.7°F | Resp 18 | Ht 63.0 in | Wt 300.0 lb

## 2019-02-10 DIAGNOSIS — C541 Malignant neoplasm of endometrium: Secondary | ICD-10-CM

## 2019-02-10 DIAGNOSIS — Z01818 Encounter for other preprocedural examination: Secondary | ICD-10-CM | POA: Insufficient documentation

## 2019-02-10 DIAGNOSIS — R739 Hyperglycemia, unspecified: Secondary | ICD-10-CM | POA: Diagnosis not present

## 2019-02-10 DIAGNOSIS — E876 Hypokalemia: Secondary | ICD-10-CM | POA: Diagnosis not present

## 2019-02-10 DIAGNOSIS — G629 Polyneuropathy, unspecified: Secondary | ICD-10-CM | POA: Diagnosis not present

## 2019-02-10 DIAGNOSIS — Z6841 Body Mass Index (BMI) 40.0 and over, adult: Secondary | ICD-10-CM | POA: Diagnosis not present

## 2019-02-10 DIAGNOSIS — I10 Essential (primary) hypertension: Secondary | ICD-10-CM | POA: Diagnosis not present

## 2019-02-10 DIAGNOSIS — G709 Myoneural disorder, unspecified: Secondary | ICD-10-CM | POA: Diagnosis not present

## 2019-02-10 DIAGNOSIS — M1990 Unspecified osteoarthritis, unspecified site: Secondary | ICD-10-CM | POA: Diagnosis not present

## 2019-02-10 DIAGNOSIS — G63 Polyneuropathy in diseases classified elsewhere: Secondary | ICD-10-CM | POA: Diagnosis not present

## 2019-02-10 DIAGNOSIS — Z79899 Other long term (current) drug therapy: Secondary | ICD-10-CM | POA: Diagnosis not present

## 2019-02-10 DIAGNOSIS — C775 Secondary and unspecified malignant neoplasm of intrapelvic lymph nodes: Secondary | ICD-10-CM | POA: Diagnosis not present

## 2019-02-10 DIAGNOSIS — Z5111 Encounter for antineoplastic chemotherapy: Secondary | ICD-10-CM | POA: Diagnosis not present

## 2019-02-10 HISTORY — DX: Myoneural disorder, unspecified: G70.9

## 2019-02-10 HISTORY — DX: Dyspnea, unspecified: R06.00

## 2019-02-10 LAB — COMPREHENSIVE METABOLIC PANEL
ALT: 18 U/L (ref 0–44)
AST: 21 U/L (ref 15–41)
Albumin: 4.2 g/dL (ref 3.5–5.0)
Alkaline Phosphatase: 111 U/L (ref 38–126)
Anion gap: 9 (ref 5–15)
BUN: 15 mg/dL (ref 8–23)
CO2: 28 mmol/L (ref 22–32)
Calcium: 9.1 mg/dL (ref 8.9–10.3)
Chloride: 103 mmol/L (ref 98–111)
Creatinine, Ser: 0.72 mg/dL (ref 0.44–1.00)
GFR calc Af Amer: >60 mL/min (ref >60)
GFR calc non Af Amer: >60 mL/min (ref >60)
Glucose, Bld: 130 mg/dL — ABNORMAL HIGH (ref 70–99)
Potassium: 3.7 mmol/L (ref 3.5–5.1)
Sodium: 140 mmol/L (ref 135–145)
Total Bilirubin: 0.3 mg/dL (ref 0.3–1.2)
Total Protein: 7.7 g/dL (ref 6.5–8.1)

## 2019-02-10 LAB — CBC
HCT: 31.9 % — ABNORMAL LOW (ref 36.0–46.0)
Hemoglobin: 10.3 g/dL — ABNORMAL LOW (ref 12.0–15.0)
MCH: 36.4 pg — ABNORMAL HIGH (ref 26.0–34.0)
MCHC: 32.3 g/dL (ref 30.0–36.0)
MCV: 112.7 fL — ABNORMAL HIGH (ref 80.0–100.0)
Platelets: 127 10*3/uL — ABNORMAL LOW (ref 150–400)
RBC: 2.83 MIL/uL — ABNORMAL LOW (ref 3.87–5.11)
RDW: 19 % — ABNORMAL HIGH (ref 11.5–15.5)
WBC: 4.3 10*3/uL (ref 4.0–10.5)
nRBC: 0 % (ref 0.0–0.2)

## 2019-02-10 LAB — URINALYSIS, ROUTINE W REFLEX MICROSCOPIC
Bilirubin Urine: NEGATIVE
Glucose, UA: NEGATIVE mg/dL
Hgb urine dipstick: NEGATIVE
Ketones, ur: NEGATIVE mg/dL
Leukocytes,Ua: NEGATIVE
Nitrite: NEGATIVE
Protein, ur: 30 mg/dL — AB
Specific Gravity, Urine: 1.023 (ref 1.005–1.030)
pH: 6 (ref 5.0–8.0)

## 2019-02-10 LAB — ABO/RH: ABO/RH(D): A POS

## 2019-02-10 MED ORDER — IBUPROFEN 600 MG PO TABS: 600.0000 mg | ORAL_TABLET | Freq: Four times a day (QID) | ORAL | 0 refills | Status: DC | PRN

## 2019-02-10 MED ORDER — SENNOSIDES-DOCUSATE SODIUM 8.6-50 MG PO TABS: 2.0000 | ORAL_TABLET | Freq: Every day | ORAL | 1 refills | Status: DC

## 2019-02-10 NOTE — Progress Notes (Signed)
Follow-up   Consult was initially requested by Dr. Paula Compton for Endometrial Cancer   Chief Complaint  Patient presents with  . 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 Good response.  Plan   There appears to have been a complete response at distant sites, and therefore I recommend minimally invasive hysterectomy, BSO to reduce risk of local recurrence and help with pelvic control. I will attempt to sample lymph nodes to confirm whether there has been a complete histologic response to therapy.   Based on final pathology we will then consider if there is a role for radiation (unlikely given that distant relapse is likely to be the most significant risk for her) or additional chemotherapy vs hormonal therapy at this time.  I discussed the surgery, its risks including  bleeding, infection, damage to internal organs (such as bladder,ureters, bowels), blood clot, reoperation and rehospitalization. I discussed that hysterectomy is not curative for stage IIIC2 endometrial cancer but instead has a role for local control.    HPI: Ms. TANNIE KOSKELA  is a very nice 68 y.o.  P2  She noted postmenopausal bleeding for the first time in October 2019. This then was heavy in November and at that time she followed up with her PCP. A Pap was done 09/02/18 showign ASC-H. She was referred onto GYN and seen by Dr. Paula Compton who performed a colposcopy, ECC, and EMB.  She had early menopause at age 15. She has not been sexually active since her 36's. She claims to have been regular with her Pap smears and states prior to the current workup her last Pap was 2 years ago. She states she has never before had an abnormal Pap.  The EMB and ECC returned with adenocarcinoma, favoring endometrial primary however + (strongly) for p16 and ER (patchy).  09/22/18 CT scan as noted in detail  below in summary -- Diffuse endometrial thickening, consistent with primary endometrial carcinoma. Pelvic and abdominal retroperitoneal and retrocrural lymphadenopathy, consistent with metastatic disease.  CT chest on 10/03/18 negative for pulmonary mets/  Interval Hx:  She was seen by my colleague, Dr Gerarda Fraction, and determined that treatment should first involve a course of therapy with neoadjuvant chemotherapy with Dr. Alvy Bimler  commencing on October 03, 2018.  She received carboplatin and paclitaxel for 6 cycles completing this on 01/16/19.   At the time of diagnosis Ca1 25 was elevated to 578.  After cycle 2 this had reduced to 81.  She underwent tumor testing which revealed an MSI stable MMR normal tumor.  After 3 cycles of chemotherapy she received CT scan of the abdomen and pelvis on February13th 2020.  This revealed significant decrease in abdominal retroperitoneal and bilateral iliac lymphadenopathy since the previous study.  The index lymph node in the aortocaval space now measured 1 cm compared with 1.7 cm previously.  Similarly bilateral iliac lymphadenopathy had decreased in size from 2.3 to 1 cm.  There were no new lesions identified or progressive disease seen.  There is a significant decrease in the abnormal endometrial soft tissue seen.  After 6 cycles of chemotherapy her CA 125 had normalized to 30 and post-treatment CT showed reduction in all nodal sites, no new disease.   She has been receiving physical therapy and patient and her family report that this was associated with improved performance status.  She is no longer experiencing vaginal bleeding.  Imported EPIC Oncologic History:  Oncology History   MSI - Stable MMR - Normal Endometrioid     Endometrial cancer ([place])   07/15/2018 Initial Diagnosis    She noted postmenopausal bleeding for the first time in October 2019. A Pap was done 09/02/18 showign ASC-H. She was referred onto GYN and seen by Dr. Paula Compton      09/09/2018 Procedure    She underwent endometrial biopsy    09/12/2018 Pathology Results    Endometrial biopsy positive for adenocarcinoma    09/22/2018 Imaging    Ct abdomen and pelvis showed: Diffuse endometrial thickening, consistent with primary endometrial carcinoma.  Pelvic and abdominal retroperitoneal and retrocrural lymphadenopathy, consistent with metastatic disease.  Moderate hepatic steatosis.    10/01/2018 Procedure    Successful placement of a right IJ approach Power Port with ultrasound and fluoroscopic guidance. The catheter is ready for use.    10/02/2018 Tumor Marker    Patient's tumor was tested for the following markers: CA-125 Results of the tumor marker test revealed 578    10/03/2018 - 01/16/2019 Chemotherapy    The patient had carboplatin and Taxol with dose adjustment due to neuropathy x 6 cycles    10/24/2018 Tumor Marker    Patient's tumor was tested for the following markers: CA-125 Results of the tumor marker test revealed 296     Genetic Testing    Patient has genetic testing done for MSI/MMR. Results revealed MSI stable and MMR normal on Accession [id number].    11/14/2018 Tumor Marker    Patient's tumor was tested for the following markers: CA-125 Results of the tumor marker test revealed 81    11/27/2018 Imaging    1. Significant decrease in abdominal retroperitoneal and bilateral iliac lymphadenopathy since previous study. 2. Significant decrease in abnormal endometrial soft tissue density since prior study. 3. No new or progressive metastatic disease identified.    12/05/2018 Tumor Marker    Patient's tumor was tested for the following markers: CA-125 Results of the tumor marker test revealed 46.3    12/26/2018 Tumor Marker    Patient's tumor was tested for the following markers: CA-125 Results of the tumor marker test revealed 38.2    01/16/2019 Tumor Marker    Patient's tumor was tested for the following markers: CA-125 Results of the  tumor marker test revealed 30.8    02/03/2019 Imaging    1. Numerous retroperitoneal, iliac, and pelvic lymph nodes are stable or slightly decreased in size compared to immediate prior examination dated 11/27/2018 and significantly decreased in size compared to exam dated 09/20/2018.  2.  Unchanged thickening of the endometrium.  3. Other chronic, incidental, and postoperative findings as detailed above.      Radiology: Ct Abdomen Pelvis W Contrast  Result Date: 02/03/2019 CLINICAL DATA:  Follow-up endometrial malignancy EXAM: CT ABDOMEN AND PELVIS WITH CONTRAST TECHNIQUE: Multidetector CT imaging of the abdomen and pelvis was performed using the standard protocol following bolus administration of intravenous contrast. CONTRAST:  118m OMNIPAQUE IOHEXOL 300 MG/ML SOLN, additional oral enteric contrast COMPARISON:  11/27/2018, 09/20/2018 FINDINGS: Lower chest: No acute abnormality. Hepatobiliary: No focal liver abnormality is seen. Status post cholecystectomy. No biliary dilatation. Pancreas: Unremarkable. No pancreatic ductal dilatation or surrounding inflammatory changes. Spleen: Normal in size without focal abnormality. Adrenals/Urinary Tract: Adrenal glands are unremarkable. Kidneys are normal, without renal calculi, focal lesion, or hydronephrosis. Bladder is unremarkable. Stomach/Bowel: Stomach is within normal limits. Appendix appears normal. No evidence of bowel wall thickening, distention, or inflammatory changes.  Sigmoid diverticula. Vascular/Lymphatic: Scattered atherosclerosis. Numerous retroperitoneal, iliac, and pelvic lymph nodes are stable or slightly decreased in size compared to immediate prior examination dated 11/27/2018 and significantly decreased in size compared to exam dated 09/20/2018. Reproductive: Unchanged thickening of the endometrium (series 7, image 57). Other: No abdominal wall hernia or abnormality. No abdominopelvic ascites. Musculoskeletal: No acute or significant  osseous findings. IMPRESSION: 1. Numerous retroperitoneal, iliac, and pelvic lymph nodes are stable or slightly decreased in size compared to immediate prior examination dated 11/27/2018 and significantly decreased in size compared to exam dated 09/20/2018. 2.  Unchanged thickening of the endometrium. 3. Other chronic, incidental, and postoperative findings as detailed above. Electronically Signed   By: Eddie Candle M.D.   On: 02/03/2019 09:18   Ct Abdomen Pelvis W Contrast  Result Date: 11/27/2018 CLINICAL DATA:  Followup endometrial carcinoma. Undergoing chemotherapy. EXAM: CT ABDOMEN AND PELVIS WITH CONTRAST TECHNIQUE: Multidetector CT imaging of the abdomen and pelvis was performed using the standard protocol following bolus administration of intravenous contrast. CONTRAST:  165m OMNIPAQUE IOHEXOL 300 MG/ML  SOLN COMPARISON:  09/20/2018 FINDINGS: Lower Chest: No acute findings. Hepatobiliary: No hepatic masses identified. Decreased hepatic steatosis noted. A few small left hepatic lobe cysts are stable. No liver masses identified. Prior cholecystectomy. No evidence of biliary obstruction. Pancreas:  No mass or inflammatory changes. Spleen: Within normal limits in size and appearance. Adrenals/Urinary Tract: No masses identified. No evidence of hydronephrosis. Stomach/Bowel: No evidence of obstruction, inflammatory process or abnormal fluid collections. Diverticulosis is seen mainly involving the sigmoid colon, however there is no evidence of diverticulitis. Vascular/Lymphatic: Abdominal retroperitoneal lymphadenopathy is significantly decreased since previous study. Index lymph node in aortocaval space currently measures 10 mm on image 36/2 compared to 17 mm previously. Decreased bilateral iliac lymphadenopathy is also demonstrated, with index lymph node in left common iliac chain measuring 10 mm on image 55/2 compared to 2.3 cm previously. No new or increased lymphadenopathy identified. No evidence of  abdominal aortic aneurysm. Aortic atherosclerosis. Reproductive: Uterus is unremarkable in appearance with significant decrease in abnormal endometrial soft tissue density since prior study. Adnexal regions are unremarkable. Other:  None. Musculoskeletal:  No suspicious bone lesions identified. IMPRESSION: 1. Significant decrease in abdominal retroperitoneal and bilateral iliac lymphadenopathy since previous study. 2. Significant decrease in abnormal endometrial soft tissue density since prior study. 3. No new or progressive metastatic disease identified. Electronically Signed   By: JEarle GellM.D.   On: 11/27/2018 10:10  Numerous retroperitoneal, iliac, and pelvic lymph nodes are stable or slightly decreased in size compared to immediate prior examination dated 11/27/2018 and significantly decreased in size compared to exam dated 09/20/2018.  Unchanged thickening of the endometrium. Other chronic, incidental, and postoperative findings as detailed above.  Outpatient Encounter Medications as of 02/10/2019  Medication Sig  . cetirizine (KLS ALLER-TEC) 10 MG tablet Take 10 mg by mouth every other day.  . diphenhydrAMINE (BENADRYL) 25 mg capsule Take 100 mg by mouth 2 (two) times a day.  . gabapentin (NEURONTIN) 400 MG capsule Take 800 mg by mouth 3 (three) times daily.   . hydrochlorothiazide (MICROZIDE) 12.5 MG capsule Take 12.5 mg by mouth daily.  .Marland Kitchenlidocaine-prilocaine (EMLA) cream Apply to affected area once (Patient taking differently: Apply 1 application topically daily as needed (prior to port being accessed.). Apply to affected area once)  . loratadine (CLARITIN) 10 MG tablet Take 10 mg by mouth every other day.   . losartan (COZAAR) 50 MG tablet Take 50 mg by mouth  daily.  . oxyCODONE-acetaminophen (PERCOCET) 10-325 MG tablet Take 1-2 tablets by mouth every 6 (six) hours as needed for pain.  Marland Kitchen zolpidem (AMBIEN) 5 MG tablet Take 1 tablet (5 mg total) by mouth at bedtime as needed for sleep.  (Patient not taking: Reported on 02/06/2019)  . [DISCONTINUED] dexamethasone (DECADRON) 4 MG tablet Take 3 tabs at the night before and 3 tab the morning of chemotherapy, every 3 weeks, by mouth (Patient not taking: Reported on 02/06/2019)  . [DISCONTINUED] ondansetron (ZOFRAN) 8 MG tablet Take 1 tablet (8 mg total) by mouth every 8 (eight) hours as needed for refractory nausea / vomiting. Start on day 3 after chemo. (Patient not taking: Reported on 02/06/2019)  . [DISCONTINUED] prochlorperazine (COMPAZINE) 10 MG tablet Take 1 tablet (10 mg total) by mouth every 6 (six) hours as needed (Nausea or vomiting). (Patient not taking: Reported on 02/06/2019)   No facility-administered encounter medications on file as of 02/10/2019.    No Known Allergies  Past Medical History:  Diagnosis Date  . Arthritis    Knees and ankles fussed rt ankle  . Dyspnea    with excertion  . Hypertension   . Neuromuscular disorder ([place]) 1992   pinched nerve after back surgery numbness Lt leg lateral and last tow toes.   Past Surgical History:  Procedure Laterality Date  . ANKLE SURGERY Right    right fusion  . BACK SURGERY     5 surgeries at [facility]; has screws and a "cage"  . CARPAL TUNNEL RELEASE Bilateral   . COLONOSCOPY    . IR IMAGING GUIDED PORT INSERTION  10/01/2018  . KNEE SURGERY Left   . LAPAROSCOPIC CHOLECYSTECTOMY    . LUMBAR LAMINECTOMY/DECOMPRESSION MICRODISCECTOMY Left 08/06/2013   Procedure: LUMBAR LAMINECTOMY/DECOMPRESSION MICRODISCECTOMY 1 LEVEL;  Surgeon: Ophelia Charter, MD;  Location: [facility];  Service: Neurosurgery;  Laterality: Left;  redo LEFT Lumbar Five-Sacral One diskectomy  . PLACEMENT OF LUMBAR DRAIN N/A 08/07/2013   Procedure: PLACEMENT OF LUMBAR DRAIN;  Surgeon: Ophelia Charter, MD;  Location: [facility];  Service: Neurosurgery;  Laterality: N/A;  . TONSILLECTOMY    . TUBAL LIGATION  1982        Past Gynecological History:   GYNECOLOGIC HISTORY:  . No LMP recorded. Patient is  postmenopausal. 68 yo . Menarche: 68 years old . P 2 . Contraceptive none . HRT none  . Last Pap referral and then states piror to this was 2 years ago; always normal Family Hx:  Family History  Problem Relation Age of Onset  . Alzheimer's disease Mother   . Colon cancer Father 25  . Heart Problems Father   . Diabetes Maternal Aunt    Social Hx:  Marland Kitchen Tobacco use: none . Alcohol use: holidays . Illicit Drug use: none . Illicit IV Drug use: none    Review of Systems: Review of Systems  Musculoskeletal: Positive for arthralgias and back pain.  All other systems reviewed and are negative.   Vitals:  Vitals:   02/10/19 1400  BP: (!) 145/81  Pulse: 85  Resp: 18  Temp: 98.7 F (37.1 C)  SpO2: 98%   Vitals:   02/10/19 1400  Weight: 300 lb (136.1 kg)  Height: _0  (1.6 m)   Body mass index is 53.14 kg/m.  Physical Exam: General :  Obese, Well developed, 68 y.o., female in no apparent distress HEENT:  Normocephalic/atraumatic, symmetric, EOMI, eyelids normal Neck:   Supple, no masses.  Lymphatics:  No cervical/  submandibular/ supraclavicular/ infraclavicular/ inguinal adenopathy Respiratory:  Respirations unlabored, no use of accessory muscles CV:   Deferred Breast:  Deferred Musculoskeletal: No CVA tenderness, normal muscle strength. Abdomen:  Obese, Soft, non-tender and nondistended. No evidence of hernia. No masses. Extremities:  Obese. No lymphedema, no erythema, non-tender. Skin:   Normal inspection Neuro/Psych:  No focal motor deficit, no abnormal mental status. Normal gait. Normal affect. Alert and oriented to person, place, and time  Genito Urinary: Vulva: Normal external female genitalia.  Bladder/urethra: Urethral meatus normal in size and location. No lesions or   masses, well supported bladder Speculum exam: Vagina: No lesion, no discharge, no bleeding.  Cervix: grossly normal, smooth, not enlarged. Uterus mobile.   Adnexal region: No  masses. Rectovaginal:  Good tone, no masses, no cul de sac nodularity, no parametrial involvement or nodularity.   Cc: Paula Compton, MD (Referring Ob/Gyn) Ronita Hipps, MD  (PCP)

## 2019-02-10 NOTE — Telephone Encounter (Signed)
Called Dr. Hardin Negus office with [facility] and left a message regarding pain management post op.  Requested a return call.

## 2019-02-10 NOTE — Patient Instructions (Addendum)
Preparing for your Surgery  Plan for surgery on February 12, 2019 with Dr. Everitt Amber at [facility] will be scheduled for a robotic assisted total hysterectomy, bilateral salpingo-oophorectomy, lymph node biopsy.   Pre-operative Testing (Already complete) -You will receive a phone call from presurgical testing at [facility] if you have not received a call already to arrange for a pre-operative testing appointment before your surgery.  This appointment normally occurs one to two weeks before your scheduled surgery.   -Bring your insurance card, copy of an advanced directive if applicable, medication list  -At that visit, you will be asked to sign a consent for a possible blood transfusion in case a transfusion becomes necessary during surgery.  The need for a blood transfusion is rare but having consent is a necessary part of your care.     -You should not be taking blood thinners or aspirin at least ten days prior to surgery unless instructed by your surgeon.  -Do not take supplements such as fish oil (omega 3), red yeast rice, tumeric before your surgery.  Day Before Surgery at [facility] will be asked to take in a light diet the day before surgery.  Avoid carbonated beverages.  You will be advised to have nothing solid to eat after midnight the evening before your surgery and NOTHING BY MOUTH EXCEPT CLEAR LIQUIDS UNTIL 3 HOURS PRIOR TO [place].    Eat a light diet the day before surgery.  Examples including soups, broths, toast, yogurt, mashed potatoes.  Things to avoid include carbonated beverages (fizzy beverages), raw fruits and raw vegetables, or beans.   If your bowels are filled with gas, your surgeon will have difficulty visualizing your pelvic organs which increases your surgical risks.  Your role in recovery Your role is to become active as soon as directed by your doctor, while still giving yourself time to heal.  Rest when you feel tired. You will be  asked to do the following in order to speed your recovery:  - Cough and breathe deeply. This helps toclear and expand your lungs and can prevent pneumonia. You may be given a spirometer to practice deep breathing. A staff member will show you how to use the spirometer. - Do mild physical activity. Walking or moving your legs help your circulation and body functions return to normal. A staff member will help you when you try to walk and will provide you with simple exercises. Do not try to get up or walk alone the first time. - Actively manage your pain. Managing your pain lets you move in comfort. We will ask you to rate your pain on a scale of zero to 10. It is your responsibility to tell your doctor or nurse where and how much you hurt so your pain can be treated.  Special Considerations -If you are diabetic, you may be placed on insulin after surgery to have closer control over your blood sugars to promote healing and recovery.  This does not mean that you will be discharged on insulin.  If applicable, your oral antidiabetics will be resumed when you are tolerating a solid diet.  -Your final pathology results from surgery should be available around one week after surgery and the results will be relayed to you when available.  -Dr. Lahoma Crocker is the surgeon that assists your GYN Oncologist with surgery.  If you end up staying the night, the next day after your surgery you will either see Dr. Denman George  or Dr. Lahoma Crocker.  -FMLA forms can be faxed to [phone] and please allow 5-7 business days for completion.  Pain Management After Surgery -You have been prescribed ibuprofen and bowel regimen medications before surgery so that you can have these available when you are discharged from the hospital. The pain medication is for use ONLY AFTER surgery and a new prescription will not be given.   -Make sure that you have Tylenol and Ibuprofen at home to use on a regular basis after surgery  for pain control. We recommend alternating the medications every hour to six hours since they work differently and are processed in the body differently for pain relief. Monitor your tylenol use.  -Review the attached handout on narcotic use and their risks and side effects.   Bowel Regimen -You have been prescribed Sennakot-S to take nightly to prevent constipation especially if you are taking the narcotic pain medication intermittently.  It is important to prevent constipation and drink adequate amounts of liquids.   Blood Transfusion Information WHAT IS A BLOOD TRANSFUSION? A transfusion is the replacement of blood or some of its parts. Blood is made up of multiple cells which provide different functions.  Red blood cells carry oxygen and are used for blood loss replacement.  White blood cells fight against infection.  Platelets control bleeding.  Plasma helps clot blood.  Other blood products are available for specialized needs, such as hemophilia or other clotting disorders. BEFORE THE TRANSFUSION  Who gives blood for transfusions?   You may be able to donate blood to be used at a later date on yourself (autologous donation).  Relatives can be asked to donate blood. This is generally not any safer than if you have received blood from a stranger. The same precautions are taken to ensure safety when a relative's blood is donated.  Healthy volunteers who are fully evaluated to make sure their blood is safe. This is blood bank blood. Transfusion therapy is the safest it has ever been in the practice of medicine. Before blood is taken from a donor, a complete history is taken to make sure that person has no history of diseases nor engages in risky social behavior (examples are intravenous drug use or sexual activity with multiple partners). The donor's travel history is screened to minimize risk of transmitting infections, such as malaria. The donated blood is tested for signs of  infectious diseases, such as HIV and hepatitis. The blood is then tested to be sure it is compatible with you in order to minimize the chance of a transfusion reaction. If you or a relative donates blood, this is often done in anticipation of surgery and is not appropriate for emergency situations. It takes many days to process the donated blood. RISKS AND COMPLICATIONS Although transfusion therapy is very safe and saves many lives, the main dangers of transfusion include:   Getting an infectious disease.  Developing a transfusion reaction. This is an allergic reaction to something in the blood you were given. Every precaution is taken to prevent this. The decision to have a blood transfusion has been considered carefully by your caregiver before blood is given. Blood is not given unless the benefits outweigh the risks.

## 2019-02-10 NOTE — Progress Notes (Signed)
Chart to [facility].   Konrad Felix PA for review. Pending EKG.

## 2019-02-10 NOTE — H&P (View-Only) (Signed)
Follow-up   Consult was initially requested by Dr. Paula Compton for Endometrial Cancer   Chief Complaint  Patient presents with  . 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 Good response.  Plan   There appears to have been a complete response at distant sites, and therefore I recommend minimally invasive hysterectomy, BSO to reduce risk of local recurrence and help with pelvic control. I will attempt to sample lymph nodes to confirm whether there has been a complete histologic response to therapy.   Based on final pathology we will then consider if there is a role for radiation (unlikely given that distant relapse is likely to be the most significant risk for her) or additional chemotherapy vs hormonal therapy at this time.  I discussed the surgery, its risks including  bleeding, infection, damage to internal organs (such as bladder,ureters, bowels), blood clot, reoperation and rehospitalization. I discussed that hysterectomy is not curative for stage IIIC2 endometrial cancer but instead has a role for local control.    HPI: Ms. Kathryn [name]  is a very nice 68 y.o.  P2  She noted postmenopausal bleeding for the first time in October 2019. This then was heavy in November and at that time she followed up with her PCP. A Pap was done 09/02/18 showign ASC-H. She was referred onto GYN and seen by Dr. Paula Compton who performed a colposcopy, ECC, and EMB.  She had early menopause at age 15. She has not been sexually active since her 36's. She claims to have been regular with her Pap smears and states prior to the current workup her last Pap was 2 years ago. She states she has never before had an abnormal Pap.  The EMB and ECC returned with adenocarcinoma, favoring endometrial primary however + (strongly) for p16 and ER (patchy).  09/22/18 CT scan as noted in detail  below in summary -- Diffuse endometrial thickening, consistent with primary endometrial carcinoma. Pelvic and abdominal retroperitoneal and retrocrural lymphadenopathy, consistent with metastatic disease.  CT chest on 10/03/18 negative for pulmonary mets/  Interval Hx:  She was seen by my colleague, Dr Gerarda Fraction, and determined that treatment should first involve a course of therapy with neoadjuvant chemotherapy with Dr. Alvy Bimler  commencing on October 03, 2018.  She received carboplatin and paclitaxel for 6 cycles completing this on 01/16/19.   At the time of diagnosis Ca1 25 was elevated to 578.  After cycle 2 this had reduced to 81.  She underwent tumor testing which revealed an MSI stable MMR normal tumor.  After 3 cycles of chemotherapy she received CT scan of the abdomen and pelvis on February13th 2020.  This revealed significant decrease in abdominal retroperitoneal and bilateral iliac lymphadenopathy since the previous study.  The index lymph node in the aortocaval space now measured 1 cm compared with 1.7 cm previously.  Similarly bilateral iliac lymphadenopathy had decreased in size from 2.3 to 1 cm.  There were no new lesions identified or progressive disease seen.  There is a significant decrease in the abnormal endometrial soft tissue seen.  After 6 cycles of chemotherapy her CA 125 had normalized to 30 and post-treatment CT showed reduction in all nodal sites, no new disease.   She has been receiving physical therapy and patient and her family report that this was associated with improved performance status.  She is no longer experiencing vaginal bleeding.  Imported EPIC Oncologic History:  Oncology History   MSI - Stable MMR - Normal Endometrioid     Endometrial cancer ([place])   07/15/2018 Initial Diagnosis    She noted postmenopausal bleeding for the first time in October 2019. A Pap was done 09/02/18 showign ASC-H. She was referred onto GYN and seen by Dr. Paula Compton      09/09/2018 Procedure    She underwent endometrial biopsy    09/12/2018 Pathology Results    Endometrial biopsy positive for adenocarcinoma    09/22/2018 Imaging    Ct abdomen and pelvis showed: Diffuse endometrial thickening, consistent with primary endometrial carcinoma.  Pelvic and abdominal retroperitoneal and retrocrural lymphadenopathy, consistent with metastatic disease.  Moderate hepatic steatosis.    10/01/2018 Procedure    Successful placement of a right IJ approach Power Port with ultrasound and fluoroscopic guidance. The catheter is ready for use.    10/02/2018 Tumor Marker    Patient's tumor was tested for the following markers: CA-125 Results of the tumor marker test revealed 578    10/03/2018 - 01/16/2019 Chemotherapy    The patient had carboplatin and Taxol with dose adjustment due to neuropathy x 6 cycles    10/24/2018 Tumor Marker    Patient's tumor was tested for the following markers: CA-125 Results of the tumor marker test revealed 296     Genetic Testing    Patient has genetic testing done for MSI/MMR. Results revealed MSI stable and MMR normal on Accession [id number].    11/14/2018 Tumor Marker    Patient's tumor was tested for the following markers: CA-125 Results of the tumor marker test revealed 81    11/27/2018 Imaging    1. Significant decrease in abdominal retroperitoneal and bilateral iliac lymphadenopathy since previous study. 2. Significant decrease in abnormal endometrial soft tissue density since prior study. 3. No new or progressive metastatic disease identified.    12/05/2018 Tumor Marker    Patient's tumor was tested for the following markers: CA-125 Results of the tumor marker test revealed 46.3    12/26/2018 Tumor Marker    Patient's tumor was tested for the following markers: CA-125 Results of the tumor marker test revealed 38.2    01/16/2019 Tumor Marker    Patient's tumor was tested for the following markers: CA-125 Results of the  tumor marker test revealed 30.8    02/03/2019 Imaging    1. Numerous retroperitoneal, iliac, and pelvic lymph nodes are stable or slightly decreased in size compared to immediate prior examination dated 11/27/2018 and significantly decreased in size compared to exam dated 09/20/2018.  2.  Unchanged thickening of the endometrium.  3. Other chronic, incidental, and postoperative findings as detailed above.      Radiology: Ct Abdomen Pelvis W Contrast  Result Date: 02/03/2019 CLINICAL DATA:  Follow-up endometrial malignancy EXAM: CT ABDOMEN AND PELVIS WITH CONTRAST TECHNIQUE: Multidetector CT imaging of the abdomen and pelvis was performed using the standard protocol following bolus administration of intravenous contrast. CONTRAST:  118m OMNIPAQUE IOHEXOL 300 MG/ML SOLN, additional oral enteric contrast COMPARISON:  11/27/2018, 09/20/2018 FINDINGS: Lower chest: No acute abnormality. Hepatobiliary: No focal liver abnormality is seen. Status post cholecystectomy. No biliary dilatation. Pancreas: Unremarkable. No pancreatic ductal dilatation or surrounding inflammatory changes. Spleen: Normal in size without focal abnormality. Adrenals/Urinary Tract: Adrenal glands are unremarkable. Kidneys are normal, without renal calculi, focal lesion, or hydronephrosis. Bladder is unremarkable. Stomach/Bowel: Stomach is within normal limits. Appendix appears normal. No evidence of bowel wall thickening, distention, or inflammatory changes.  Sigmoid diverticula. Vascular/Lymphatic: Scattered atherosclerosis. Numerous retroperitoneal, iliac, and pelvic lymph nodes are stable or slightly decreased in size compared to immediate prior examination dated 11/27/2018 and significantly decreased in size compared to exam dated 09/20/2018. Reproductive: Unchanged thickening of the endometrium (series 7, image 57). Other: No abdominal wall hernia or abnormality. No abdominopelvic ascites. Musculoskeletal: No acute or significant  osseous findings. IMPRESSION: 1. Numerous retroperitoneal, iliac, and pelvic lymph nodes are stable or slightly decreased in size compared to immediate prior examination dated 11/27/2018 and significantly decreased in size compared to exam dated 09/20/2018. 2.  Unchanged thickening of the endometrium. 3. Other chronic, incidental, and postoperative findings as detailed above. Electronically Signed   By: Eddie Candle M.D.   On: 02/03/2019 09:18   Ct Abdomen Pelvis W Contrast  Result Date: 11/27/2018 CLINICAL DATA:  Followup endometrial carcinoma. Undergoing chemotherapy. EXAM: CT ABDOMEN AND PELVIS WITH CONTRAST TECHNIQUE: Multidetector CT imaging of the abdomen and pelvis was performed using the standard protocol following bolus administration of intravenous contrast. CONTRAST:  165m OMNIPAQUE IOHEXOL 300 MG/ML  SOLN COMPARISON:  09/20/2018 FINDINGS: Lower Chest: No acute findings. Hepatobiliary: No hepatic masses identified. Decreased hepatic steatosis noted. A few small left hepatic lobe cysts are stable. No liver masses identified. Prior cholecystectomy. No evidence of biliary obstruction. Pancreas:  No mass or inflammatory changes. Spleen: Within normal limits in size and appearance. Adrenals/Urinary Tract: No masses identified. No evidence of hydronephrosis. Stomach/Bowel: No evidence of obstruction, inflammatory process or abnormal fluid collections. Diverticulosis is seen mainly involving the sigmoid colon, however there is no evidence of diverticulitis. Vascular/Lymphatic: Abdominal retroperitoneal lymphadenopathy is significantly decreased since previous study. Index lymph node in aortocaval space currently measures 10 mm on image 36/2 compared to 17 mm previously. Decreased bilateral iliac lymphadenopathy is also demonstrated, with index lymph node in left common iliac chain measuring 10 mm on image 55/2 compared to 2.3 cm previously. No new or increased lymphadenopathy identified. No evidence of  abdominal aortic aneurysm. Aortic atherosclerosis. Reproductive: Uterus is unremarkable in appearance with significant decrease in abnormal endometrial soft tissue density since prior study. Adnexal regions are unremarkable. Other:  None. Musculoskeletal:  No suspicious bone lesions identified. IMPRESSION: 1. Significant decrease in abdominal retroperitoneal and bilateral iliac lymphadenopathy since previous study. 2. Significant decrease in abnormal endometrial soft tissue density since prior study. 3. No new or progressive metastatic disease identified. Electronically Signed   By: JEarle GellM.D.   On: 11/27/2018 10:10  Numerous retroperitoneal, iliac, and pelvic lymph nodes are stable or slightly decreased in size compared to immediate prior examination dated 11/27/2018 and significantly decreased in size compared to exam dated 09/20/2018.  Unchanged thickening of the endometrium. Other chronic, incidental, and postoperative findings as detailed above.  Outpatient Encounter Medications as of 02/10/2019  Medication Sig  . cetirizine (KLS ALLER-TEC) 10 MG tablet Take 10 mg by mouth every other day.  . diphenhydrAMINE (BENADRYL) 25 mg capsule Take 100 mg by mouth 2 (two) times a day.  . gabapentin (NEURONTIN) 400 MG capsule Take 800 mg by mouth 3 (three) times daily.   . hydrochlorothiazide (MICROZIDE) 12.5 MG capsule Take 12.5 mg by mouth daily.  .Marland Kitchenlidocaine-prilocaine (EMLA) cream Apply to affected area once (Patient taking differently: Apply 1 application topically daily as needed (prior to port being accessed.). Apply to affected area once)  . loratadine (CLARITIN) 10 MG tablet Take 10 mg by mouth every other day.   . losartan (COZAAR) 50 MG tablet Take 50 mg by mouth  daily.  . oxyCODONE-acetaminophen (PERCOCET) 10-325 MG tablet Take 1-2 tablets by mouth every 6 (six) hours as needed for pain.  Marland Kitchen zolpidem (AMBIEN) 5 MG tablet Take 1 tablet (5 mg total) by mouth at bedtime as needed for sleep.  (Patient not taking: Reported on 02/06/2019)  . [DISCONTINUED] dexamethasone (DECADRON) 4 MG tablet Take 3 tabs at the night before and 3 tab the morning of chemotherapy, every 3 weeks, by mouth (Patient not taking: Reported on 02/06/2019)  . [DISCONTINUED] ondansetron (ZOFRAN) 8 MG tablet Take 1 tablet (8 mg total) by mouth every 8 (eight) hours as needed for refractory nausea / vomiting. Start on day 3 after chemo. (Patient not taking: Reported on 02/06/2019)  . [DISCONTINUED] prochlorperazine (COMPAZINE) 10 MG tablet Take 1 tablet (10 mg total) by mouth every 6 (six) hours as needed (Nausea or vomiting). (Patient not taking: Reported on 02/06/2019)   No facility-administered encounter medications on file as of 02/10/2019.    No Known Allergies  Past Medical History:  Diagnosis Date  . Arthritis    Knees and ankles fussed rt ankle  . Dyspnea    with excertion  . Hypertension   . Neuromuscular disorder ([place]) 1992   pinched nerve after back surgery numbness Lt leg lateral and last tow toes.   Past Surgical History:  Procedure Laterality Date  . ANKLE SURGERY Right    right fusion  . BACK SURGERY     5 surgeries at [facility]; has screws and a "cage"  . CARPAL TUNNEL RELEASE Bilateral   . COLONOSCOPY    . IR IMAGING GUIDED PORT INSERTION  10/01/2018  . KNEE SURGERY Left   . LAPAROSCOPIC CHOLECYSTECTOMY    . LUMBAR LAMINECTOMY/DECOMPRESSION MICRODISCECTOMY Left 08/06/2013   Procedure: LUMBAR LAMINECTOMY/DECOMPRESSION MICRODISCECTOMY 1 LEVEL;  Surgeon: Ophelia Charter, MD;  Location: [place] [facility];  Service: Neurosurgery;  Laterality: Left;  redo LEFT Lumbar Five-Sacral One diskectomy  . PLACEMENT OF LUMBAR DRAIN N/A 08/07/2013   Procedure: PLACEMENT OF LUMBAR DRAIN;  Surgeon: Ophelia Charter, MD;  Location: [place];  Service: Neurosurgery;  Laterality: N/A;  . TONSILLECTOMY    . TUBAL LIGATION  1982        Past Gynecological History:   GYNECOLOGIC HISTORY:  . No LMP recorded. Patient is  postmenopausal. 68 yo . Menarche: 68 years old . P 2 . Contraceptive none . HRT none  . Last Pap referral and then states piror to this was 2 years ago; always normal Family Hx:  Family History  Problem Relation Age of Onset  . Alzheimer's disease Mother   . Colon cancer Father 25  . Heart Problems Father   . Diabetes Maternal Aunt    Social Hx:  Marland Kitchen Tobacco use: none . Alcohol use: holidays . Illicit Drug use: none . Illicit IV Drug use: none    Review of Systems: Review of Systems  Musculoskeletal: Positive for arthralgias and back pain.  All other systems reviewed and are negative.   Vitals:  Vitals:   02/10/19 1400  BP: (!) 145/81  Pulse: 85  Resp: 18  Temp: 98.7 F (37.1 C)  SpO2: 98%   Vitals:   02/10/19 1400  Weight: 300 lb (136.1 kg)  Height: _0  (1.6 m)   Body mass index is 53.14 kg/m.  Physical Exam: General :  Obese, Well developed, 68 y.o., female in no apparent distress HEENT:  Normocephalic/atraumatic, symmetric, EOMI, eyelids normal Neck:   Supple, no masses.  Lymphatics:  No cervical/  submandibular/ supraclavicular/ infraclavicular/ inguinal adenopathy Respiratory:  Respirations unlabored, no use of accessory muscles CV:   Deferred Breast:  Deferred Musculoskeletal: No CVA tenderness, normal muscle strength. Abdomen:  Obese, Soft, non-tender and nondistended. No evidence of hernia. No masses. Extremities:  Obese. No lymphedema, no erythema, non-tender. Skin:   Normal inspection Neuro/Psych:  No focal motor deficit, no abnormal mental status. Normal gait. Normal affect. Alert and oriented to person, place, and time  Genito Urinary: Vulva: Normal external female genitalia.  Bladder/urethra: Urethral meatus normal in size and location. No lesions or   masses, well supported bladder Speculum exam: Vagina: No lesion, no discharge, no bleeding.  Cervix: grossly normal, smooth, not enlarged. Uterus mobile.   Adnexal region: No  masses. Rectovaginal:  Good tone, no masses, no cul de sac nodularity, no parametrial involvement or nodularity.   Cc: Paula Compton, MD (Referring Ob/Gyn) Ronita Hipps, MD  (PCP)

## 2019-02-11 ENCOUNTER — Other Ambulatory Visit: Payer: Self-pay | Admitting: Gynecologic Oncology

## 2019-02-11 DIAGNOSIS — G8918 Other acute postprocedural pain: Secondary | ICD-10-CM

## 2019-02-11 MED ORDER — DEXTROSE 5 % IV SOLN
3.0000 g | INTRAVENOUS | Status: AC
  Administered 2019-02-12: 3 g via INTRAVENOUS
  Filled 2019-02-11: qty 3

## 2019-02-11 MED ORDER — OXYCODONE HCL 5 MG PO TABS: 5.0000 mg | ORAL_TABLET | ORAL | 0 refills | Status: DC | PRN

## 2019-02-11 NOTE — Progress Notes (Signed)
Anesthesia Chart Review   Case:  [id number] Date/Time:  02/12/19 0700   Procedures:      XI ROBOTIC ASSISTED TOTAL HYSTERECTOMY WITH BILATERAL SALPINGO OOPHORECTOMY (Bilateral )     LYMPH NODE BIOPSY (N/A )   Anesthesia type:  General   Pre-op diagnosis:  ENDOMETRIAL CANCER   Location:  [place] [facility] / [facility]   Surgeon:  Everitt Amber, MD      DISCUSSION:68 yo never smoker with h/o HTN, h/o back surgery with chronic left leg numbness, anemia, endometrial cancer (neoadjuvant chemo) scheduled for above procedure 02/12/19 with Dr. Everitt Amber.   Pt can proceed with planned procedure barring acute status change.   VS: BP (!) 160/88   Pulse 91   Temp (!) 36.1 C (Oral)   Resp 18   Ht 5\' 3"  (1.6 m)   Wt (!) 136.1 kg   SpO2 98%   BMI 53.16 kg/m   PROVIDERS: Ronita Hipps, MD is PCP   Heath Lark, MD is Oncologist  LABS: Labs reviewed: Acceptable for surgery. (all labs ordered are listed, but only abnormal results are displayed)  Labs Reviewed  CBC - Abnormal; Notable for the following components:      Result Value   RBC 2.83 (*)    Hemoglobin 10.3 (*)    HCT 31.9 (*)    MCV 112.7 (*)    MCH 36.4 (*)    RDW 19.0 (*)    Platelets 127 (*)    All other components within normal limits  COMPREHENSIVE METABOLIC PANEL - Abnormal; Notable for the following components:   Glucose, Bld 130 (*)    All other components within normal limits  URINALYSIS, ROUTINE W REFLEX MICROSCOPIC - Abnormal; Notable for the following components:   APPearance HAZY (*)    Protein, ur 30 (*)    Bacteria, UA RARE (*)    All other components within normal limits  TYPE AND SCREEN  ABO/RH     IMAGES: CT Abdomen Pelvis 02/02/2019 IMPRESSION: 1. Numerous retroperitoneal, iliac, and pelvic lymph nodes are stable or slightly decreased in size compared to immediate prior examination dated 11/27/2018 and significantly decreased in size compared to exam dated 09/20/2018.  2.  Unchanged thickening of the  endometrium.  3. Other chronic, incidental, and postoperative findings as detailed above.  EKG: 02/10/2019 Rate 81 bpm Normal sinus rhythm  Normal ECG   CV:  Past Medical History:  Diagnosis Date  . Arthritis    Knees and ankles fussed rt ankle  . Dyspnea    with excertion  . Hypertension   . Neuromuscular disorder ([place]) 1992   pinched nerve after back surgery numbness Lt leg lateral and last tow toes.    Past Surgical History:  Procedure Laterality Date  . ANKLE SURGERY Right    right fusion  . BACK SURGERY     5 surgeries at [facility]; has screws and a "cage"  . CARPAL TUNNEL RELEASE Bilateral   . COLONOSCOPY    . IR IMAGING GUIDED PORT INSERTION  10/01/2018  . KNEE SURGERY Left   . LAPAROSCOPIC CHOLECYSTECTOMY    . LUMBAR LAMINECTOMY/DECOMPRESSION MICRODISCECTOMY Left 08/06/2013   Procedure: LUMBAR LAMINECTOMY/DECOMPRESSION MICRODISCECTOMY 1 LEVEL;  Surgeon: Ophelia Charter, MD;  Location: [place] [facility];  Service: Neurosurgery;  Laterality: Left;  redo LEFT Lumbar Five-Sacral One diskectomy  . PLACEMENT OF LUMBAR DRAIN N/A 08/07/2013   Procedure: PLACEMENT OF LUMBAR DRAIN;  Surgeon: Ophelia Charter, MD;  Location: [place];  Service:  Neurosurgery;  Laterality: N/A;  . TONSILLECTOMY    . TUBAL LIGATION  1982    MEDICATIONS: . cetirizine (KLS ALLER-TEC) 10 MG tablet  . diphenhydrAMINE (BENADRYL) 25 mg capsule  . gabapentin (NEURONTIN) 400 MG capsule  . hydrochlorothiazide (MICROZIDE) 12.5 MG capsule  . ibuprofen (ADVIL) 600 MG tablet  . lidocaine-prilocaine (EMLA) cream  . loratadine (CLARITIN) 10 MG tablet  . losartan (COZAAR) 50 MG tablet  . oxyCODONE-acetaminophen (PERCOCET) 10-325 MG tablet  . senna-docusate (SENOKOT-S) 8.6-50 MG tablet  . zolpidem (AMBIEN) 5 MG tablet   No current facility-administered medications for this encounter.    Derrill Memo ON 02/12/2019] ceFAZolin (ANCEF) 3 g in dextrose 5 % 50 mL IVPB    Maia Plan  [facility] Pre-Surgical  Testing [phone] 02/11/19 3:21 PM

## 2019-02-11 NOTE — Telephone Encounter (Signed)
Called [facility] and spoke to [place].  He will send a note to Dr. Hardin Negus and will call us back.

## 2019-02-11 NOTE — Telephone Encounter (Addendum)
Ryan from [facility] called back and said the Dr. Hardin Negus would like [place] to manage post op pain management and then forward him a note to let him know what was given/done.  He said to fax the note to [phone].

## 2019-02-11 NOTE — Progress Notes (Signed)
Called patient to notify her that we are prescribing a prescription of oxycodone #20 to take as needed. Advised she can continue taking her normal chronic pain medication regimen but she now has additional oxycodone 5 mg tablets to take if she needs it for her post-operative pain. Advised that normally we advise patients post-op that they can use the pain medication (oxycodone) every 4 hours AS NEEDED for severe pain so advised that if she needed to take an oxycodone she could but she needed to use caution to avoid oversedation and to not take and drive.  Medications to prevent constipation discussed at her pre-op visit. Our office will reach out to her after discharge to assess her post-op status and pain.  Her pain management MD, Dr. Nicholaus Bloom, is aware of her surgery and the additional prescription.

## 2019-02-11 NOTE — Anesthesia Preprocedure Evaluation (Addendum)
Anesthesia Evaluation  Patient identified by MRN, date of birth, ID band Patient awake    Reviewed: Allergy & Precautions, NPO status , Patient's Chart, lab work & pertinent test results  Airway Mallampati: III  TM Distance: >3 FB     Dental  (+) Dental Advisory Given   Pulmonary neg pulmonary ROS,    breath sounds clear to auscultation       Cardiovascular hypertension, Pt. on medications  Rhythm:Regular Rate:Normal     Neuro/Psych  Neuromuscular disease    GI/Hepatic negative GI ROS, Neg liver ROS,   Endo/Other  Morbid obesity  Renal/GU negative Renal ROS     Musculoskeletal  (+) Arthritis ,   Abdominal   Peds  Hematology  (+) anemia ,   Anesthesia Other Findings   Reproductive/Obstetrics                           Lab Results  Component Value Date   WBC 4.3 02/10/2019   HGB 10.3 (L) 02/10/2019   HCT 31.9 (L) 02/10/2019   MCV 112.7 (H) 02/10/2019   PLT 127 (L) 02/10/2019   Lab Results  Component Value Date   CREATININE 0.72 02/10/2019   BUN 15 02/10/2019   NA 140 02/10/2019   K 3.7 02/10/2019   CL 103 02/10/2019   CO2 28 02/10/2019    Anesthesia Physical Anesthesia Plan  ASA: III  Anesthesia Plan: General   Post-op Pain Management:    Induction: Intravenous  PONV Risk Score and Plan: 3 and 4 or greater and Dexamethasone, Ondansetron, Scopolamine patch - Pre-op, Treatment may vary due to age or medical condition and Midazolam  Airway Management Planned: Oral ETT  Additional Equipment:   Intra-op Plan:   Post-operative Plan: Extubation in OR  Informed Consent: I have reviewed the patients History and Physical, chart, labs and discussed the procedure including the risks, benefits and alternatives for the proposed anesthesia with the patient or authorized representative who has indicated his/her understanding and acceptance.     Dental advisory given  Plan  Discussed with: CRNA  Anesthesia Plan Comments:       Anesthesia Quick Evaluation

## 2019-02-11 NOTE — Progress Notes (Signed)
Final EKG dated 02-10-2019 in epic.

## 2019-02-12 ENCOUNTER — Encounter (HOSPITAL_COMMUNITY): Payer: Self-pay

## 2019-02-12 ENCOUNTER — Ambulatory Visit (HOSPITAL_COMMUNITY): Payer: Medicare Other | Admitting: Anesthesiology

## 2019-02-12 ENCOUNTER — Encounter: Payer: Self-pay | Admitting: Oncology

## 2019-02-12 ENCOUNTER — Encounter (HOSPITAL_COMMUNITY)
Admission: RE | Disposition: A | Discharge status: Home / Self Care | Payer: Self-pay | Source: Home / Self Care | Attending: Gynecologic Oncology

## 2019-02-12 ENCOUNTER — Telehealth: Payer: Self-pay | Admitting: *Deleted

## 2019-02-12 ENCOUNTER — Ambulatory Visit (HOSPITAL_COMMUNITY)
Admission: RE | Admit: 2019-02-12 | Discharge: 2019-02-12 | Disposition: A | Discharge status: Home / Self Care | Payer: Medicare Other | Attending: Gynecologic Oncology | Admitting: Gynecologic Oncology

## 2019-02-12 DIAGNOSIS — I10 Essential (primary) hypertension: Secondary | ICD-10-CM | POA: Diagnosis not present

## 2019-02-12 DIAGNOSIS — Z79899 Other long term (current) drug therapy: Secondary | ICD-10-CM | POA: Diagnosis not present

## 2019-02-12 DIAGNOSIS — G709 Myoneural disorder, unspecified: Secondary | ICD-10-CM | POA: Insufficient documentation

## 2019-02-12 DIAGNOSIS — Z6841 Body Mass Index (BMI) 40.0 and over, adult: Secondary | ICD-10-CM | POA: Insufficient documentation

## 2019-02-12 DIAGNOSIS — C775 Secondary and unspecified malignant neoplasm of intrapelvic lymph nodes: Secondary | ICD-10-CM | POA: Diagnosis not present

## 2019-02-12 DIAGNOSIS — R5381 Other malaise: Secondary | ICD-10-CM | POA: Diagnosis present

## 2019-02-12 DIAGNOSIS — M1990 Unspecified osteoarthritis, unspecified site: Secondary | ICD-10-CM | POA: Insufficient documentation

## 2019-02-12 DIAGNOSIS — R599 Enlarged lymph nodes, unspecified: Secondary | ICD-10-CM | POA: Diagnosis not present

## 2019-02-12 DIAGNOSIS — G4701 Insomnia due to medical condition: Secondary | ICD-10-CM | POA: Diagnosis not present

## 2019-02-12 DIAGNOSIS — C541 Malignant neoplasm of endometrium: Secondary | ICD-10-CM | POA: Diagnosis not present

## 2019-02-12 HISTORY — PX: ROBOTIC ASSISTED TOTAL HYSTERECTOMY WITH BILATERAL SALPINGO OOPHERECTOMY: SHX6086

## 2019-02-12 HISTORY — PX: LYMPH NODE BIOPSY: SHX201

## 2019-02-12 LAB — TYPE AND SCREEN
ABO/RH(D): A POS
Antibody Screen: NEGATIVE

## 2019-02-12 SURGERY — HYSTERECTOMY, TOTAL, ROBOT-ASSISTED, LAPAROSCOPIC, WITH BILATERAL SALPINGO-OOPHORECTOMY
Anesthesia: General

## 2019-02-12 MED ORDER — FENTANYL CITRATE (PF) 100 MCG/2ML IJ SOLN
INTRAMUSCULAR | Status: AC
  Filled 2019-02-12: qty 2

## 2019-02-12 MED ORDER — LACTATED RINGERS IR SOLN
Status: DC | PRN
  Administered 2019-02-12: 1

## 2019-02-12 MED ORDER — LIDOCAINE 2% (20 MG/ML) 5 ML SYRINGE
INTRAMUSCULAR | Status: DC | PRN
  Administered 2019-02-12: 80 mg via INTRAVENOUS

## 2019-02-12 MED ORDER — BUPIVACAINE HCL 0.25 % IJ SOLN
INTRAMUSCULAR | Status: DC | PRN
  Administered 2019-02-12: 20 mL

## 2019-02-12 MED ORDER — ROCURONIUM BROMIDE 10 MG/ML (PF) SYRINGE
PREFILLED_SYRINGE | INTRAVENOUS | Status: AC
  Filled 2019-02-12: qty 10

## 2019-02-12 MED ORDER — SCOPOLAMINE 1 MG/3DAYS TD PT72
1.0000 | MEDICATED_PATCH | TRANSDERMAL | Status: DC
  Administered 2019-02-12: 1.5 mg via TRANSDERMAL
  Filled 2019-02-12: qty 1

## 2019-02-12 MED ORDER — ACETAMINOPHEN 650 MG RE SUPP
650.0000 mg | RECTAL | Status: DC | PRN
  Filled 2019-02-12: qty 1

## 2019-02-12 MED ORDER — LIDOCAINE HCL 2 % IJ SOLN
INTRAMUSCULAR | Status: AC
  Filled 2019-02-12: qty 20

## 2019-02-12 MED ORDER — MIDAZOLAM HCL 5 MG/5ML IJ SOLN
INTRAMUSCULAR | Status: DC | PRN
  Administered 2019-02-12: 2 mg via INTRAVENOUS

## 2019-02-12 MED ORDER — FENTANYL CITRATE (PF) 250 MCG/5ML IJ SOLN
INTRAMUSCULAR | Status: AC
  Filled 2019-02-12: qty 5

## 2019-02-12 MED ORDER — MIDAZOLAM HCL 2 MG/2ML IJ SOLN
INTRAMUSCULAR | Status: AC
  Filled 2019-02-12: qty 2

## 2019-02-12 MED ORDER — FENTANYL CITRATE (PF) 100 MCG/2ML IJ SOLN
INTRAMUSCULAR | Status: DC | PRN
  Administered 2019-02-12: 50 ug via INTRAVENOUS
  Administered 2019-02-12: 100 ug via INTRAVENOUS
  Administered 2019-02-12 (×2): 50 ug via INTRAVENOUS

## 2019-02-12 MED ORDER — DEXAMETHASONE SODIUM PHOSPHATE 4 MG/ML IJ SOLN
4.0000 mg | INTRAMUSCULAR | Status: AC
  Administered 2019-02-12: 4 mg via INTRAVENOUS

## 2019-02-12 MED ORDER — ACETAMINOPHEN 325 MG PO TABS: 650.0000 mg | ORAL_TABLET | ORAL | Status: DC | PRN

## 2019-02-12 MED ORDER — FENTANYL CITRATE (PF) 100 MCG/2ML IJ SOLN
25.0000 ug | INTRAMUSCULAR | Status: DC | PRN
  Administered 2019-02-12 (×2): 50 ug via INTRAVENOUS

## 2019-02-12 MED ORDER — KETAMINE HCL 10 MG/ML IJ SOLN
INTRAMUSCULAR | Status: AC
  Filled 2019-02-12: qty 1

## 2019-02-12 MED ORDER — STERILE WATER FOR IRRIGATION IR SOLN
Status: DC | PRN
  Administered 2019-02-12: 1000 mL

## 2019-02-12 MED ORDER — SODIUM CHLORIDE 0.9 % IV SOLN: 250.0000 mL | INTRAVENOUS | Status: DC | PRN

## 2019-02-12 MED ORDER — SUGAMMADEX SODIUM 500 MG/5ML IV SOLN
INTRAVENOUS | Status: DC | PRN
  Administered 2019-02-12: 300 mg via INTRAVENOUS

## 2019-02-12 MED ORDER — EPHEDRINE 5 MG/ML INJ
INTRAVENOUS | Status: AC
  Filled 2019-02-12: qty 10

## 2019-02-12 MED ORDER — MORPHINE SULFATE (PF) 4 MG/ML IV SOLN: 2.0000 mg | INTRAVENOUS | Status: DC | PRN

## 2019-02-12 MED ORDER — LIDOCAINE 2% (20 MG/ML) 5 ML SYRINGE
INTRAMUSCULAR | Status: AC
  Filled 2019-02-12: qty 5

## 2019-02-12 MED ORDER — GABAPENTIN 300 MG PO CAPS
300.0000 mg | ORAL_CAPSULE | ORAL | Status: DC
  Filled 2019-02-12: qty 1

## 2019-02-12 MED ORDER — ROCURONIUM BROMIDE 50 MG/5ML IV SOSY
PREFILLED_SYRINGE | INTRAVENOUS | Status: DC | PRN
  Administered 2019-02-12: 10 mg via INTRAVENOUS
  Administered 2019-02-12: 60 mg via INTRAVENOUS

## 2019-02-12 MED ORDER — PROPOFOL 10 MG/ML IV BOLUS
INTRAVENOUS | Status: AC
  Filled 2019-02-12: qty 20

## 2019-02-12 MED ORDER — SUCCINYLCHOLINE CHLORIDE 200 MG/10ML IV SOSY
PREFILLED_SYRINGE | INTRAVENOUS | Status: DC | PRN
  Administered 2019-02-12: 140 mg via INTRAVENOUS

## 2019-02-12 MED ORDER — ONDANSETRON HCL 4 MG/2ML IJ SOLN
INTRAMUSCULAR | Status: AC
  Filled 2019-02-12: qty 2

## 2019-02-12 MED ORDER — LACTATED RINGERS IV SOLN
INTRAVENOUS | Status: DC | PRN
  Administered 2019-02-12: 08:00:00 via INTRAVENOUS

## 2019-02-12 MED ORDER — SUCCINYLCHOLINE CHLORIDE 200 MG/10ML IV SOSY
PREFILLED_SYRINGE | INTRAVENOUS | Status: AC
  Filled 2019-02-12: qty 10

## 2019-02-12 MED ORDER — BUPIVACAINE HCL (PF) 0.25 % IJ SOLN
INTRAMUSCULAR | Status: AC
  Filled 2019-02-12: qty 30

## 2019-02-12 MED ORDER — SUGAMMADEX SODIUM 500 MG/5ML IV SOLN
INTRAVENOUS | Status: AC
  Filled 2019-02-12: qty 5

## 2019-02-12 MED ORDER — ONDANSETRON HCL 4 MG/2ML IJ SOLN: 4.0000 mg | Freq: Once | INTRAMUSCULAR | Status: DC | PRN

## 2019-02-12 MED ORDER — ACETAMINOPHEN 500 MG PO TABS
1000.0000 mg | ORAL_TABLET | ORAL | Status: AC
  Administered 2019-02-12: 06:00:00 1000 mg via ORAL
  Filled 2019-02-12: qty 2

## 2019-02-12 MED ORDER — ENOXAPARIN SODIUM 40 MG/0.4ML ~~LOC~~ SOLN
40.0000 mg | SUBCUTANEOUS | Status: AC
  Administered 2019-02-12: 06:00:00 40 mg via SUBCUTANEOUS
  Filled 2019-02-12: qty 0.4

## 2019-02-12 MED ORDER — KETAMINE HCL 10 MG/ML IJ SOLN
INTRAMUSCULAR | Status: DC | PRN
  Administered 2019-02-12: 30 mg via INTRAVENOUS

## 2019-02-12 MED ORDER — LACTATED RINGERS IV SOLN
INTRAVENOUS | Status: DC
  Administered 2019-02-12 (×2): via INTRAVENOUS

## 2019-02-12 MED ORDER — BUPIVACAINE-EPINEPHRINE (PF) 0.25% -1:200000 IJ SOLN
INTRAMUSCULAR | Status: AC
  Filled 2019-02-12: qty 30

## 2019-02-12 MED ORDER — PROPOFOL 10 MG/ML IV BOLUS
INTRAVENOUS | Status: DC | PRN
  Administered 2019-02-12: 200 mg via INTRAVENOUS

## 2019-02-12 MED ORDER — SODIUM CHLORIDE 0.9% FLUSH: 3.0000 mL | Freq: Two times a day (BID) | INTRAVENOUS | Status: DC

## 2019-02-12 MED ORDER — OXYCODONE HCL 5 MG PO TABS: 5.0000 mg | ORAL_TABLET | ORAL | Status: DC | PRN

## 2019-02-12 MED ORDER — DEXAMETHASONE SODIUM PHOSPHATE 10 MG/ML IJ SOLN
INTRAMUSCULAR | Status: AC
  Filled 2019-02-12: qty 1

## 2019-02-12 MED ORDER — SODIUM CHLORIDE 0.9% FLUSH: 3.0000 mL | INTRAVENOUS | Status: DC | PRN

## 2019-02-12 MED ORDER — LIDOCAINE 20MG/ML (2%) 15 ML SYRINGE OPTIME
INTRAMUSCULAR | Status: DC | PRN
  Administered 2019-02-12: 1.5 mg/kg/h via INTRAVENOUS

## 2019-02-12 MED ORDER — EPHEDRINE SULFATE-NACL 50-0.9 MG/10ML-% IV SOSY
PREFILLED_SYRINGE | INTRAVENOUS | Status: DC | PRN
  Administered 2019-02-12: 5 mg via INTRAVENOUS

## 2019-02-12 MED ORDER — ONDANSETRON HCL 4 MG/2ML IJ SOLN
INTRAMUSCULAR | Status: DC | PRN
  Administered 2019-02-12: 4 mg via INTRAVENOUS

## 2019-02-12 SURGICAL SUPPLY — 56 items
ADH SKN CLS APL DERMABOND .7 (GAUZE/BANDAGES/DRESSINGS) ×2
AGENT HMST KT MTR STRL THRMB (HEMOSTASIS)
APL ESCP 34 STRL LF DISP (HEMOSTASIS)
APPLICATOR SURGIFLO ENDO (HEMOSTASIS) IMPLANT
BAG LAPAROSCOPIC 12 15 PORT 16 (BASKET) IMPLANT
BAG RETRIEVAL 12/15 (BASKET)
BAG SPEC RTRVL LRG 6X4 10 (ENDOMECHANICALS)
COVER BACK TABLE 60X90IN (DRAPES) ×3 IMPLANT
COVER TIP SHEARS 8 DVNC (MISCELLANEOUS) ×2 IMPLANT
COVER TIP SHEARS 8MM DA VINCI (MISCELLANEOUS) ×1
COVER WAND RF STERILE (DRAPES) IMPLANT
DECANTER SPIKE VIAL GLASS SM (MISCELLANEOUS) IMPLANT
DERMABOND ADVANCED (GAUZE/BANDAGES/DRESSINGS) ×1
DERMABOND ADVANCED .7 DNX12 (GAUZE/BANDAGES/DRESSINGS) ×2 IMPLANT
DRAPE ARM DVNC X/XI (DISPOSABLE) ×8 IMPLANT
DRAPE COLUMN DVNC XI (DISPOSABLE) ×2 IMPLANT
DRAPE DA VINCI XI ARM (DISPOSABLE) ×4
DRAPE DA VINCI XI COLUMN (DISPOSABLE) ×1
DRAPE SHEET LG 3/4 BI-LAMINATE (DRAPES) ×3 IMPLANT
DRAPE SURG IRRIG POUCH 19X23 (DRAPES) ×3 IMPLANT
ELECT REM PT RETURN 15FT ADLT (MISCELLANEOUS) ×3 IMPLANT
GLOVE BIO SURGEON STRL SZ 6 (GLOVE) ×12 IMPLANT
GLOVE BIO SURGEON STRL SZ 6.5 (GLOVE) IMPLANT
GOWN STRL REUS W/ TWL LRG LVL3 (GOWN DISPOSABLE) ×4 IMPLANT
GOWN STRL REUS W/TWL LRG LVL3 (GOWN DISPOSABLE) ×6
HOLDER FOLEY CATH W/STRAP (MISCELLANEOUS) ×3 IMPLANT
IRRIG SUCT STRYKERFLOW 2 WTIP (MISCELLANEOUS) ×3
IRRIGATION SUCT STRKRFLW 2 WTP (MISCELLANEOUS) ×2 IMPLANT
KIT PROCEDURE DA VINCI SI (MISCELLANEOUS)
KIT PROCEDURE DVNC SI (MISCELLANEOUS) IMPLANT
KIT TURNOVER KIT A (KITS) IMPLANT
MANIPULATOR UTERINE 4.5 ZUMI (MISCELLANEOUS) ×3 IMPLANT
NDL SPNL 18GX3.5 QUINCKE PK (NEEDLE) IMPLANT
NEEDLE HYPO 22GX1.5 SAFETY (NEEDLE) IMPLANT
NEEDLE SPNL 18GX3.5 QUINCKE PK (NEEDLE) IMPLANT
OBTURATOR OPTICAL STANDARD 8MM (TROCAR) ×1
OBTURATOR OPTICAL STND 8 DVNC (TROCAR) ×2
OBTURATOR OPTICALSTD 8 DVNC (TROCAR) ×2 IMPLANT
PACK ROBOT GYN CUSTOM WL (TRAY / TRAY PROCEDURE) ×3 IMPLANT
PAD POSITIONING PINK XL (MISCELLANEOUS) ×3 IMPLANT
PORT ACCESS TROCAR AIRSEAL 12 (TROCAR) ×2 IMPLANT
PORT ACCESS TROCAR AIRSEAL 5M (TROCAR) ×1
POUCH SPECIMEN RETRIEVAL 10MM (ENDOMECHANICALS) IMPLANT
SEAL CANN UNIV 5-8 DVNC XI (MISCELLANEOUS) ×6 IMPLANT
SEAL XI 5MM-8MM UNIVERSAL (MISCELLANEOUS) ×3
SET TRI-LUMEN FLTR TB AIRSEAL (TUBING) ×3 IMPLANT
SURGIFLO W/THROMBIN 8M KIT (HEMOSTASIS) IMPLANT
SUT VIC AB 0 CT1 27 (SUTURE) ×3
SUT VIC AB 0 CT1 27XBRD ANTBC (SUTURE) IMPLANT
SUT VIC AB 4-0 PS2 18 (SUTURE) ×6 IMPLANT
SYR 10ML LL (SYRINGE) IMPLANT
TOWEL OR NON WOVEN STRL DISP B (DISPOSABLE) ×3 IMPLANT
TRAP SPECIMEN MUCOUS 40CC (MISCELLANEOUS) IMPLANT
TRAY FOLEY MTR SLVR 16FR STAT (SET/KITS/TRAYS/PACK) ×3 IMPLANT
UNDERPAD 30X30 (UNDERPADS AND DIAPERS) ×3 IMPLANT
WATER STERILE IRR 1000ML POUR (IV SOLUTION) ×3 IMPLANT

## 2019-02-12 NOTE — Anesthesia Postprocedure Evaluation (Signed)
Anesthesia Post Note  Patient: Kathryn [name]  Procedure(s) Performed: XI ROBOTIC ASSISTED TOTAL HYSTERECTOMY WITH BILATERAL SALPINGO OOPHORECTOMY (Bilateral ) RIGHT PELVIC LYMPH NODE DISSECTION (N/A )     Patient location during evaluation: PACU Anesthesia Type: General Level of consciousness: awake and alert Pain management: pain level controlled Vital Signs Assessment: post-procedure vital signs reviewed and stable Respiratory status: spontaneous breathing, nonlabored ventilation, respiratory function stable and patient connected to nasal cannula oxygen Cardiovascular status: blood pressure returned to baseline and stable Postop Assessment: no apparent nausea or vomiting Anesthetic complications: no    Last Vitals:  Vitals:   02/12/19 1045 02/12/19 1115  BP: (!) 158/81 (!) 155/86  Pulse: 69 75  Resp: 12 16  Temp: (!) 36.3 C (!) 36.3 C  SpO2: 96% 98%    Last Pain:  Vitals:   02/12/19 1115  TempSrc:   PainSc: 4                  Tiajuana Amass

## 2019-02-12 NOTE — Op Note (Signed)
OPERATIVE NOTE 02/12/19  Surgeon: Donaciano Eva   Assistants: Dr Lahoma Crocker (an MD assistant was necessary for tissue manipulation, management of robotic instrumentation, retraction and positioning due to the complexity of the case and hospital policies).   Anesthesia: General endotracheal anesthesia  ASA Class: 3   Pre-operative Diagnosis: endometrial cancer stage IIIC2, s/p neoadjuvant chemotherapy, extreme obesity (BMI 53kg/m2)   Post-operative Diagnosis: same,   Operation: Robotic-assisted laparoscopic total hysterectomy with bilateral salpingoophorectomy, right pelvic lymphadenectomy. 22 modifier for extreme obesity  Surgeon: Donaciano Eva  Assistant Surgeon: Lahoma Crocker MD  Anesthesia: GET  Urine Output: 50cc  Operative Findings:  : extreme obesity, BMI 53kg/m2, with significant retroperitoneal and intraperitoneal adiposity. Obesity necessitated an additional hour of operating time to create safe exposure. Obesity required additional personnel in the operating room for positioning and retraction. Severe obesity substantially increased the complexity of the procedure.  8cm uterus, grossly normal, normal appearing cervix. Retroperitoneal fibrosis consistent with nodal positivity. Clinically suspicious right pelvic lymph node. Unable to completely visualize retroperitoneal nodes due to extreme obesity.   Estimated Blood Loss:  less than 50 mL      Total IV Fluids: 1,000 ml         Specimens: uterus, cervix, bilateral tubes and ovaries, right pelvic lymph nodes         Complications:  None; patient tolerated the procedure well.         Disposition: PACU - hemodynamically stable.  Procedure Details  The patient was seen in the Holding Room. The risks, benefits, complications, treatment options, and expected outcomes were discussed with the patient.  The patient concurred with the proposed plan, giving informed consent.  The site of surgery properly  noted/marked. The patient was identified as Kathryn [name] and the procedure verified as a Robotic-assisted hysterectomy with bilateral salpingo oophorectomy with SLN biopsy. A Time Out was held and the above information confirmed.  After induction of anesthesia, the patient was draped and prepped in the usual sterile manner. Pt was placed in supine position after anesthesia and draped and prepped in the usual sterile manner. The abdominal drape was placed after the CholoraPrep had been allowed to dry for 3 minutes.  Her arms were tucked to her side with all appropriate precautions.  The shoulders were stabilized with padded shoulder blocks applied to the acromium processes.  The patient was placed in the semi-lithotomy position in [place]. For 30 minutes careful attention was made to adequately padding and protecting and positioning. Additional IV access was necessary due to obesity.  Additional equipment was necessary (sleds) and foam to safely position.   The perineum was prepped with Betadine. The patient was then prepped. Foley catheter was placed.  A sterile speculum was placed in the vagina.  The cervix was grasped with a single-tooth tenaculum. The cervix was dilated with Kennon Rounds dilators.  The ZUMI uterine manipulator with a medium colpotomizer ring was placed without difficulty.  A pneum occluder balloon was placed over the manipulator.  OG tube placement was confirmed and to suction.   Next, a 5 mm skin incision was made 1 cm below the subcostal margin in the midclavicular line.  The 5 mm Optiview port and scope was used for direct entry.  Opening pressure was under 10 mm CO2.  The abdomen was insufflated and the findings were noted as above.    At this point and all points during the procedure, the patient's intra-abdominal pressure did not exceed 15 mmHg. Next, a  10 mm skin incision was made in the umbilicus and a right and left port was placed about 10 cm lateral to the robot port on the  right and left side.  A fourth arm was placed in the left lower quadrant 2 cm above and superior and medial to the anterior superior iliac spine.  All ports were placed under direct visualization.  The patient was placed in steep Trendelenburg.  Bowel was folded away into the upper abdomen.  The robot was docked in the normal manner.   There was extreme abdominal adiposity which meant that additional instrumentation was necessary for mobilization of bowel and exposure.   The hysterectomy was started after the round ligament on the right side was incised and the retroperitoneum was entered and the pararectal space was developed.  The ureter was noted to be on the medial leaf of the broad ligament.  The peritoneum above the ureter was incised and stretched and the infundibulopelvic ligament was skeletonized, cauterized and cut.  The posterior peritoneum was taken down to the level of the KOH ring.  The anterior peritoneum was also taken down.  The bladder flap was created to the level of the KOH ring.  The uterine artery on the right side was skeletonized, cauterized and cut in the normal manner.  A similar procedure was performed on the left.  The colpotomy was made and the uterus, cervix, bilateral ovaries and tubes were amputated and delivered through the vagina.  Pedicles were inspected and excellent hemostasis was achieved.    The paravesical space was developed with monopolar and sharp dissection. It was held open with tension on the median umbilical ligament with the forth arm. The paravesical space was opened with blunt and sharp dissection to mobilize the ureter off of the medial surface of the internal iliac artery. The medial leaf of the broad ligament containing the ureter was held medially (opening the pararectal space) by the assistant's grasper. The right pelvic lymphadenectomy was performed by skeletonizing the internal iliac artery at the bifurcation with the external iliac artery. The obturator  nerve was identified in the base of lateral paravesical space. The ureter was mobilized medially off of the dissection by developing the pararectal space. The genitofemoral nerve was identified, skeletonized and mobilized laterally off of the external iliac artery. An enbloc resection of lymph nodes was performed within the following boundaries: the mid portion of the common iliac proximally, the circumflex iliac vein distally, the obturator nerve posteriorally, the genitofemoral nerve laterally. The nodal basin (including obturator space) were confirmed to be empty of nodes and hemostatic. The nodes were placed in an endocatch bag and retrieved from the left upper quadrant port.   The colpotomy at the vaginal cuff was closed with Vicryl on a CT1 needle in a running manner.  Irrigation was used and excellent hemostasis was achieved.  At this point in the procedure was completed.  Robotic instruments were removed under direct visulaization.  The robot was undocked. The 10 mm ports were closed with Vicryl on a UR-5 needle and the fascia was closed with 0 Vicryl on a UR-5 needle.  The skin was closed with 4-0 Vicryl in a subcuticular manner.  Dermabond was applied.  Sponge, lap and needle counts correct x 2.  The patient was taken to the recovery room in stable condition.  The vagina was swabbed with  minimal bleeding noted.   All instrument and needle counts were correct x  3.    The patient was  transferred to the recovery room in a stable condition.  Donaciano Eva, MD

## 2019-02-12 NOTE — Discharge Instructions (Signed)
Return to work: 4 weeks (2 weeks with physical restrictions).  Activity: 1. Be up and out of the bed during the day.  Take a nap if needed.  You may walk up steps but be careful and use the hand rail.  Stair climbing will tire you more than you think, you may need to stop part way and rest.   2. No lifting or straining for 4 weeks.  3. No driving for 1 weeks.  Do Not drive if you are taking narcotic pain medicine.  4. Shower daily.  Use soap and water on your incision and pat dry; don't rub.   5. No sexual activity and nothing in the vagina for 8 weeks.  Medications:  - Take ibuprofen and tylenol first line for pain control. Take these regularly (every 6 hours) to decrease the build up of pain.  - If necessary, for severe pain not relieved by ibuprofen, contact Dr Serita Grit office and you will be prescribed percocet.  - While taking percocet you should take sennakot every night to reduce the likelihood of constipation. If this causes diarrhea, stop its use.  Diet: 1. Low sodium Heart Healthy Diet is recommended.  2. It is safe to use a laxative if you have difficulty moving your bowels.   Wound Care: 1. Keep clean and dry.  Shower daily.  Reasons to call the Doctor:   Fever - Oral temperature greater than 100.4 degrees Fahrenheit  Foul-smelling vaginal discharge  Difficulty urinating  Nausea and vomiting  Increased pain at the site of the incision that is unrelieved with pain medicine.  Difficulty breathing with or without chest pain  New calf pain especially if only on one side  Sudden, continuing increased vaginal bleeding with or without clots.   Follow-up: 1. See Kathryn [name] in 4 weeks.  Contacts: For questions or concerns you should contact:  Dr. Everitt [name] at [phone] After hours and on week-ends call [phone] and ask to speak to the physician on call for Gynecologic Oncology   Total Laparoscopic Hysterectomy, Care After This sheet gives you  information about how to care for yourself after your procedure. Your health care provider may also give you more specific instructions. If you have problems or questions, contact your health care provider. What can I expect after the procedure? After the procedure, it is common to have:  Pain and bruising around your incisions.  A sore throat, if a breathing tube was used during surgery.  Fatigue.  Poor appetite.  Less interest in sex. If your ovaries were also removed, it is also common to have symptoms of menopause such as hot flashes, night sweats, and lack of sleep (insomnia). Follow these instructions at home: Bathing  Do not take baths, swim, or use a hot tub until your health care provider approves. You may need to only take showers for 2-3 weeks.  Keep your bandage (dressing) dry until your health care provider says it can be removed. Incision care   Follow instructions from your health care provider about how to take care of your incisions. Make sure you: ? Wash your hands with soap and water before you change your dressing. If soap and water are not available, use hand sanitizer. ? Change your dressing as told by your health care provider. ? Leave stitches (sutures), skin glue, or adhesive strips in place. These skin closures may need to stay in place for 2 weeks or longer. If adhesive strip edges start to loosen and curl  up, you may trim the loose edges. Do not remove adhesive strips completely unless your health care provider tells you to do that.  Check your incision area every day for signs of infection. Check for: ? Redness, swelling, or pain. ? Fluid or blood. ? Warmth. ? Pus or a bad smell. Activity  Get plenty of rest and sleep.  Do not lift anything that is heavier than 10 lbs (4.5 kg) for one month after surgery, or as long as told by your health care provider.  Do not drive or use heavy machinery while taking prescription pain medicine.  Do not drive for  24 hours if you were given a medicine to help you relax (sedative).  Return to your normal activities as told by your health care provider. Ask your health care provider what activities are safe for you. Lifestyle   Do not use any products that contain nicotine or tobacco, such as cigarettes and e-cigarettes. These can delay healing. If you need help quitting, ask your health care provider.  Do not drink alcohol until your health care provider approves. General instructions  Do not douche, use tampons, or have sex for at least 6 weeks, or as told by your health care provider.  Take over-the-counter and prescription medicines only as told by your health care provider.  To monitor yourself for a fever, take your temperature at least once a day during recovery.  If you struggle with physical or emotional changes after your procedure, speak with your health care provider or a therapist.  To prevent or treat constipation while you are taking prescription pain medicine, your health care provider may recommend that you: ? Drink enough fluid to keep your urine clear or pale yellow. ? Take over-the-counter or prescription medicines. ? Eat foods that are high in fiber, such as fresh fruits and vegetables, whole grains, and beans. ? Limit foods that are high in fat and processed sugars, such as fried and sweet foods.  Keep all follow-up visits as told by your health care provider. This is important. Contact a health care provider if:  You have chills or a fever.  You have redness, swelling, or pain around an incision.  You have fluid or blood coming from an incision.  Your incision feels warm to the touch.  You have pus or a bad smell coming from an incision.  An incision breaks open.  You feel dizzy or light-headed.  You have pain or bleeding when you urinate.  You have diarrhea, nausea, or vomiting that does not go away.  You have abnormal vaginal discharge.  You have a  rash.  You have pain that does not get better with medicine. Get help right away if:  You have a fever and your symptoms suddenly get worse.  You have severe abdominal pain.  You have chest pain.  You have shortness of breath.  You faint.  You have pain, swelling, or redness on your leg.  You have heavy vaginal bleeding with blood clots. Summary  After the procedure it is common to have abdominal pain. Your provider will give you medication for this.  Do not take baths, swim, or use a hot tub until your health care provider approves.  Do not lift anything that is heavier than 10 lbs (4.5 kg) for one month after surgery, or as long as told by your health care provider.  Notify your provider if you have any signs or symptoms of infection after the procedure. This information is  not intended to replace advice given to you by your health care provider. Make sure you discuss any questions you have with your health care provider. Document Released: 07/22/2013 Document Revised: 12/12/2016 Document Reviewed: 12/12/2016 Elsevier Interactive Patient Education  2019 [employer].

## 2019-02-12 NOTE — Interval H&P Note (Signed)
History and Physical Interval Note:  02/12/2019 7:10 AM  [place]  has presented today for surgery, with the diagnosis of ENDOMETRIAL CANCER.  The various methods of treatment have been discussed with the patient and family. After consideration of risks, benefits and other options for treatment, the patient has consented to  Procedure(s): XI ROBOTIC ASSISTED TOTAL HYSTERECTOMY WITH BILATERAL SALPINGO OOPHORECTOMY (Bilateral) LYMPH NODE BIOPSY (N/A) as a surgical intervention.  The patient's history has been reviewed, patient examined, no change in status, stable for surgery.  I have reviewed the patient's chart and labs.  Questions were answered to the patient's satisfaction.     Thereasa Solo

## 2019-02-12 NOTE — Transfer of Care (Signed)
Immediate Anesthesia Transfer of Care Note  Patient: Kathryn [name]  Procedure(s) Performed: XI ROBOTIC ASSISTED TOTAL HYSTERECTOMY WITH BILATERAL SALPINGO OOPHORECTOMY (Bilateral ) RIGHT PELVIC LYMPH NODE DISSECTION (N/A )  Patient Location: PACU  Anesthesia Type:General  Level of Consciousness: drowsy and patient cooperative  Airway & Oxygen Therapy: Patient Spontanous Breathing and Patient connected to face mask oxygen  Post-op Assessment: Report given to RN and Post -op Vital signs reviewed and stable  Post vital signs: Reviewed and stable  Last Vitals:  Vitals Value Taken Time  BP 138/76 02/12/2019 10:03 AM  Temp 36.3 C 02/12/2019 10:03 AM  Pulse 65 02/12/2019 10:06 AM  Resp 12 02/12/2019 10:06 AM  SpO2 100 % 02/12/2019 10:06 AM  Vitals shown include unvalidated device data.  Last Pain:  Vitals:   02/12/19 0629  TempSrc:   PainSc: 0-No pain         Complications: No apparent anesthesia complications

## 2019-02-12 NOTE — Telephone Encounter (Signed)
Per Santiago Glad RN navigator faxed letter and Lenna Sciara APP note to Dr. Hardin Negus

## 2019-02-12 NOTE — Anesthesia Procedure Notes (Addendum)
Procedure Name: Intubation Date/Time: 02/12/2019 7:40 AM Performed by: Montel Clock, CRNA Pre-anesthesia Checklist: Patient identified, Emergency Drugs available, Suction available, Patient being monitored and Timeout performed Patient Re-evaluated:Patient Re-evaluated prior to induction Oxygen Delivery Method: Circle system utilized Preoxygenation: Pre-oxygenation with 100% oxygen Induction Type: IV induction and Rapid sequence Laryngoscope Size: Mac and 3 Grade View: Grade II Tube type: Oral Tube size: 7.0 mm Number of attempts: 1 Airway Equipment and Method: Stylet Placement Confirmation: ETT inserted through vocal cords under direct vision,  positive ETCO2 and breath sounds checked- equal and bilateral Secured at: 21 cm Tube secured with: Tape Dental Injury: Teeth and Oropharynx as per pre-operative assessment  Comments: Downward laryngeal pressure required for optimal view.

## 2019-02-12 NOTE — Progress Notes (Signed)
Spoke with [name], pt's son, and confirmed that he is providing ride home for pt.  He will be sitting in his car in the parking lot here at the hospital.  Gave him an estimate on surgery end time and time in recovery.  Informed him he would be called once pt is in recovery. He voiced understanding.

## 2019-02-13 ENCOUNTER — Encounter (HOSPITAL_COMMUNITY): Payer: Self-pay | Admitting: Gynecologic Oncology

## 2019-02-17 ENCOUNTER — Encounter: Payer: Self-pay | Admitting: Oncology

## 2019-02-17 ENCOUNTER — Telehealth: Payer: Self-pay | Admitting: Gynecologic Oncology

## 2019-02-17 NOTE — Telephone Encounter (Signed)
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 Recommended either:  1/ no further therapy at this time and wait to initiate therapy with recurrence 2/ recommence systemic therapy (either additional chemotherapy, or hormonal therapy or targeted therapy with trastuzumab). We will obtain ER/PR testing and Her2 neu testing of tumor. I explained we would not know the definitive course of therapy given the lack of measurable disease. Effectively this would be maintenance. 3/ radiation to the pelvic and aortic nodes.  She will consider these options further and we will discuss further when she returns for postop.   Thereasa Solo, MD

## 2019-02-17 NOTE — Progress Notes (Signed)
Requested ER/PR/Her2Neu testing on Accession: [id number] with Suanne Marker in [facility].

## 2019-03-02 ENCOUNTER — Telehealth: Payer: Self-pay | Admitting: Oncology

## 2019-03-02 ENCOUNTER — Encounter: Payer: Self-pay | Admitting: Oncology

## 2019-03-02 NOTE — Telephone Encounter (Signed)
Called Kathryn [name] and advised her of appointment with Dr. Alvy Bimler on 03/13/19 at 10:15.  She verbalized understanding and agreement.

## 2019-03-02 NOTE — Progress Notes (Signed)
Gynecologic Oncology Multi-Disciplinary Disposition Conference Note  Date of the Conference: 03/02/2019  Patient Name: Kathryn [name]  Referring Provider: Dr. Marvel Plan Primary GYN Oncologist: Dr. Denman George  Stage/Disposition:  Stage IIIC2 endometrial cancer. Disposition is to chemotherapy with carboplatin/taxol/herceptin for 3 cycles and vaginal brachytherapy followed by repeat imaging.  This Multidisciplinary conference took place involving physicians from [place], [place], Radiation Oncology, Pathology, Radiology along with the Gynecologic Oncology Nurse Practitioner and RN.  Comprehensive assessment of the patient's malignancy, staging, need for surgery, chemotherapy, radiation therapy, and need for further testing were reviewed. Supportive measures, both inpatient and following discharge were also discussed. The recommended plan of care is documented. Greater than 35 minutes were spent correlating and coordinating this patient's care.

## 2019-03-04 ENCOUNTER — Ambulatory Visit: Payer: Medicare Other | Admitting: Gynecologic Oncology

## 2019-03-11 ENCOUNTER — Other Ambulatory Visit: Payer: Self-pay

## 2019-03-11 ENCOUNTER — Encounter: Payer: Self-pay | Admitting: Gynecologic Oncology

## 2019-03-11 ENCOUNTER — Ambulatory Visit: Payer: Medicare Other | Admitting: Gynecologic Oncology

## 2019-03-11 ENCOUNTER — Inpatient Hospital Stay: Payer: Medicare Other | Attending: Hematology and Oncology | Admitting: Hematology and Oncology

## 2019-03-11 ENCOUNTER — Telehealth: Payer: Self-pay

## 2019-03-11 ENCOUNTER — Inpatient Hospital Stay: Payer: Medicare Other | Admitting: Gynecologic Oncology

## 2019-03-11 VITALS — BP 142/92 | HR 83 | Temp 98.7°F | Ht 63.0 in | Wt 298.6 lb

## 2019-03-11 VITALS — BP 142/92 | HR 99 | Temp 98.7°F | Resp 18 | Ht 63.0 in | Wt 298.0 lb

## 2019-03-11 DIAGNOSIS — C541 Malignant neoplasm of endometrium: Secondary | ICD-10-CM

## 2019-03-11 DIAGNOSIS — R5381 Other malaise: Secondary | ICD-10-CM

## 2019-03-11 DIAGNOSIS — C775 Secondary and unspecified malignant neoplasm of intrapelvic lymph nodes: Secondary | ICD-10-CM | POA: Diagnosis not present

## 2019-03-11 DIAGNOSIS — Z5111 Encounter for antineoplastic chemotherapy: Secondary | ICD-10-CM

## 2019-03-11 DIAGNOSIS — Z90722 Acquired absence of ovaries, bilateral: Secondary | ICD-10-CM

## 2019-03-11 DIAGNOSIS — Z7189 Other specified counseling: Secondary | ICD-10-CM

## 2019-03-11 DIAGNOSIS — Z9221 Personal history of antineoplastic chemotherapy: Secondary | ICD-10-CM | POA: Insufficient documentation

## 2019-03-11 DIAGNOSIS — Z90710 Acquired absence of both cervix and uterus: Secondary | ICD-10-CM

## 2019-03-11 DIAGNOSIS — G63 Polyneuropathy in diseases classified elsewhere: Secondary | ICD-10-CM

## 2019-03-11 MED ORDER — DEXAMETHASONE 4 MG PO TABS: ORAL_TABLET | ORAL | 0 refills | Status: DC

## 2019-03-11 NOTE — Progress Notes (Signed)
ON PATHWAY REGIMEN - Uterine  No Change  Continue With Treatment as Ordered.     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

## 2019-03-11 NOTE — Patient Instructions (Signed)
Dr Denman George is recommending treatment with Herceptin (traztuzumab). Dr Alvy Bimler may recommend additional cycles of carboplatin and paclitaxel chemotherapy as well.  Your cancer expresses Her-2 and estrogen and progesterone receptors. There may be a role for hormonal treatment of your cancer in the future.   Dr Denman George will see you back for pelvic examinations periodically as guided by Dr Alvy Bimler.

## 2019-03-11 NOTE — Progress Notes (Signed)
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

## 2019-03-11 NOTE — Progress Notes (Signed)
Follow-up   Consult was initially requested by Dr. Paula Compton for Endometrial Cancer   Chief Complaint  Patient presents with  . Endometrial cancer ([place])     Assessment  Stage IIIC2 endometrial cancer MS - stable, MMR normal Her-2 receptor positive ER - positive (strong) PR - positive (strong)   s/p 6 cycles neoadjuvant chemotherapy with carboplatin and paclitaxel and hysterectomy in April, 2020. Residual cancer in uterine specimen and nodes.  Plan   I spent 30 minutes with Kathryn [name] and her children discussing the pathology results from her surgery. We discussed that residual carcinoma was present in the uterus and nodes. We discussed that her malignancy expresses receptors for Her-2 and ER/PR. Therefore there are targeted therapies to offer which are associated with improved PFS (including Herceptin).  I am recommending Herceptin maintenance therapy (+/- additional carboplatin/paclitaxel). Consideration could be made for [facility] progestin therapy after completing Herceptin or if she does not tolerate this. Additionally, she could consider therapeutic estrogent receptor suppression (including megace or aromatase inhibitors or SERMs) should she experience recurrence.    HPI: Kathryn [name]  is a very nice 68 y.o.  P2  She noted postmenopausal bleeding for the first time in October 2019. This then was heavy in November and at that time she followed up with her PCP. A Pap was done 09/02/18 showign ASC-H. She was referred onto GYN and seen by Dr. Paula Compton who performed a colposcopy, ECC, and EMB.  She had early menopause at age 83. She has not been sexually active since her 62's. She claims to have been regular with her Pap smears and states prior to the current workup her last Pap was 2 years ago. She states she has never before had an abnormal Pap.  The EMB and ECC returned with adenocarcinoma, favoring endometrial primary however + (strongly) for p16 and  ER (patchy).  09/22/18 CT scan as noted in detail below in summary -- Diffuse endometrial thickening, consistent with primary endometrial carcinoma. Pelvic and abdominal retroperitoneal and retrocrural lymphadenopathy, consistent with metastatic disease.  CT chest on 10/03/18 negative for pulmonary mets/  She was seen by my colleague, Dr Gerarda Fraction, and determined that treatment should first involve a course of therapy with neoadjuvant chemotherapy with Dr. Alvy Bimler  commencing on October 03, 2018.  She received carboplatin and paclitaxel for 6 cycles completing this on 01/16/19.   At the time of diagnosis Ca1 25 was elevated to 578.  After cycle 2 this had reduced to 81.  She underwent tumor testing which revealed an MSI stable MMR normal tumor.  After 3 cycles of chemotherapy she received CT scan of the abdomen and pelvis on February13th 2020.  This revealed significant decrease in abdominal retroperitoneal and bilateral iliac lymphadenopathy since the previous study.  The index lymph node in the aortocaval space now measured 1 cm compared with 1.7 cm previously.  Similarly bilateral iliac lymphadenopathy had decreased in size from 2.3 to 1 cm.  There were no new lesions identified or progressive disease seen.  There is a significant decrease in the abnormal endometrial soft tissue seen.  After 6 cycles of chemotherapy her CA 125 had normalized to 30 and post-treatment CT showed reduction in all nodal sites, no new disease.   She had been receiving physical therapy and patient and her family report that this was associated with improved performance status.  Interval Hx:  On 02/02/19 She underwent a robotic assisted total hysterectomy with BSO and pelvic lymph node sampling.  There was no gross visible disease intraoperatively although there was retroperitoneal fibrosis consistent with node positivity.  There was also clinically suspicious right pelvic lymph node which was removed.  The uterus itself  appeared grossly normal.  Final pathology revealed a serous endometrial carcinoma with 19 of 20 mm myometrial invasion, no LVSI.  The cervical stroma was involved.  The clinically suspicious right pelvic lymph node that was removed was also positive for metastatic carcinoma (micrometastasis less than 2 mm).  Immunohistochemistry studies revealed that the tumor was MSI stable, MMR normal, HER-2 positive, ER PR strongly positive.  Postoperatively she did well with no complaints.   Imported EPIC Oncologic History:  Oncology History   MSI - Stable MMR - Normal Mixed high grade endometrioid and serous ER/PR 70%, Her 2/neu 3+     Endometrial cancer ([place])   07/15/2018 Initial Diagnosis    She noted postmenopausal bleeding for the first time in October 2019. A Pap was done 09/02/18 showign ASC-H. She was referred onto GYN and seen by Dr. Paula Compton     09/09/2018 Procedure    She underwent endometrial biopsy    09/12/2018 Pathology Results    Endometrial biopsy positive for adenocarcinoma    09/22/2018 Imaging    Ct abdomen and pelvis showed: Diffuse endometrial thickening, consistent with primary endometrial carcinoma.  Pelvic and abdominal retroperitoneal and retrocrural lymphadenopathy, consistent with metastatic disease.  Moderate hepatic steatosis.    09/25/2018 Cancer Staging    Staging form: Corpus Uteri - Carcinoma and Carcinosarcoma, AJCC 8th Edition - Pathologic: Stage IIIC2 (pT2, pN2, cM0) - Signed by Heath Lark, MD on 02/23/2019    10/01/2018 Procedure    Successful placement of a right IJ approach Power Port with ultrasound and fluoroscopic guidance. The catheter is ready for use.    10/02/2018 Tumor Marker    Patient's tumor was tested for the following markers: CA-125 Results of the tumor marker test revealed 578    10/03/2018 - 01/16/2019 Chemotherapy    The patient had carboplatin and Taxol with dose adjustment due to neuropathy x 6 cycles    10/24/2018 Tumor  Marker    Patient's tumor was tested for the following markers: CA-125 Results of the tumor marker test revealed 296     Genetic Testing    Patient has genetic testing done for MSI/MMR. Results revealed MSI stable and MMR normal on Accession [id number].    11/14/2018 Tumor Marker    Patient's tumor was tested for the following markers: CA-125 Results of the tumor marker test revealed 81    11/27/2018 Imaging    1. Significant decrease in abdominal retroperitoneal and bilateral iliac lymphadenopathy since previous study. 2. Significant decrease in abnormal endometrial soft tissue density since prior study. 3. No new or progressive metastatic disease identified.    12/05/2018 Tumor Marker    Patient's tumor was tested for the following markers: CA-125 Results of the tumor marker test revealed 46.3    12/26/2018 Tumor Marker    Patient's tumor was tested for the following markers: CA-125 Results of the tumor marker test revealed 38.2    01/16/2019 Tumor Marker    Patient's tumor was tested for the following markers: CA-125 Results of the tumor marker test revealed 30.8    02/03/2019 Imaging    1. Numerous retroperitoneal, iliac, and pelvic lymph nodes are stable or slightly decreased in size compared to immediate prior examination dated 11/27/2018 and significantly decreased in size compared to exam dated 09/20/2018.  2.  Unchanged  thickening of the endometrium.  3. Other chronic, incidental, and postoperative findings as detailed above.    02/12/2019 Imaging    1. Uterus +/- tubes/ovaries, neoplastic, cervix, bilateral fallopian tubes and ovaries - MIXED HIGH-GRADE SEROUS AND ENDOMETRIAL ADENOCARCINOMA, 6.5 CM. SEE NOTE - CARCINOMA INVOLVES ENTIRE ENDOMETRIUM, ANTERIOR AND POSTERIOR LOWER UTERINE SEGMENTS AND ANTERIOR AND POSTERIOR CERVIX. - CARCINOMA INVADES FOR A DEPTH OF 1.9 CM WHERE MYOMETRIAL THICKNESS IS 2.0 CM (GREATER THAN 50% MYOMETRIAL INVASION) - CARCINOMA IS 1 MM FROM  THE CERVICAL MARGIN - NEGATIVE FOR LYMPHOVASCULAR OR PERINEURAL INVASION - BILATERAL UNREMARKABLE FALLOPIAN TUBES AND OVARIES, NEGATIVE FOR CARCINOMA - SEE ONCOLOGY TABLE 2. Lymph node, biopsy, right pelvic - METASTATIC CARCINOMA TO ONE OF TWO LYMPH NODES (1/2) Microscopic Comment 1. (v4.1.0.0) UTERUS, CARCINOMA OR CARCINOSARCOMA Procedure: Total hysterectomy with bilateral salpingo-oophorectomy Histologic type: Mixed high-grade serous and endometrioid adenocarcinoma Histologic Grade: NA Myometrial invasion: Present Depth of invasion: 19 mm Myometrial thickness: 20 mm Uterine Serosa Involvement: Not identified Cervical stromal involvement: Present Extent of involvement of other organs: NA Lymphovascular invasion: Not identified Regional Lymph Nodes: Examined: 0 Sentinel 2 Non-sentinel 2 Total Lymph nodes with metastasis: 1 Isolated tumor cells (< 0.2 mm): 0 Micrometastasis: (> 0.2 mm and < 2.0 mm): 1 Macrometastasis: (> 2.0 mm): 0 Extracapsular extension: Not identified Tumor block for ancillary studies: 23F MMR / MSI testing: Pending Pathologic Stage Classification (pTNM, AJCC 8th edition): pT2, pN80m FIGO Stage: IIIC1 Representative Tumor Block: 1A, 1C (v4.1.0.0) Diagnosis Note 1. Immunohistochemical stains for p16, p53, ER and Ki-67 show a pattern of staining consistent with mixed high-grade serous and endometrial adenocarcinoma. Dr. KLyndon Codehas reviewed this case and concurs with the above interpretation. A molecular study for microsatellite instability (MSI) and immunostains for MMR-related proteins are pending and will be reported in an addendum.    02/12/2019 Surgery    Pre-operative Diagnosis: endometrial cancer stage IIIC2, s/p neoadjuvant chemotherapy, extreme obesity (BMI 53kg/m2)  Operation: Robotic-assisted laparoscopic total hysterectomy with bilateral salpingoophorectomy, right pelvic lymphadenectomy. 22 modifier for extreme obesity  Surgeon: RDonaciano Eva Assistant Surgeon: LLahoma CrockerMD  Operative Findings:  : extreme obesity, BMI 53kg/m2, with significant retroperitoneal and intraperitoneal adiposity. Obesity necessitated an additional hour of operating time to create safe exposure. Obesity required additional personnel in the operating room for positioning and retraction. Severe obesity substantially increased the complexity of the procedure. 8cm uterus, grossly normal, normal appearing cervix. Retroperitoneal fibrosis consistent with nodal positivity. Clinically suspicious right pelvic lymph node. Unable to completely visualize retroperitoneal nodes due to extreme obesity.     02/12/2019 Pathology Results    IMMUNOHISTOCHEMICAL AND MORPHOMETRIC ANALYSIS PERFORMED MANUALLY The tumor cells are POSITIVE for Her2 (3+). Estrogen Receptor: 70%, POSITIVE, STRONG STAINING INTENSITY Progesterone Receptor: 70%, POSITIVE, STRONG STAINING INTENSITY Proliferation Marker Ki67: 20% REFERENCE RANGE ESTROGEN RECEPTOR NEGATIVE 0% POSITIVE =>1% REFERENCE RANGE PROGESTERONE RECEPTOR NEGATIVE 0% POSITIVE =>1% All controls stained appropriately  1. Uterus +/- tubes/ovaries, neoplastic, cervix, bilateral fallopian tubes and ovaries - MIXED HIGH-GRADE SEROUS AND ENDOMETRIAL ADENOCARCINOMA, 6.5 CM. SEE NOTE - CARCINOMA INVOLVES ENTIRE ENDOMETRIUM, ANTERIOR AND POSTERIOR LOWER UTERINE SEGMENTS AND ANTERIOR AND POSTERIOR CERVIX. - CARCINOMA INVADES FOR A DEPTH OF 1.9 CM WHERE MYOMETRIAL THICKNESS IS 2.0 CM (GREATER THAN 50% MYOMETRIAL INVASION) - CARCINOMA IS 1 MM FROM THE CERVICAL MARGIN - NEGATIVE FOR LYMPHOVASCULAR OR PERINEURAL INVASION - BILATERAL UNREMARKABLE FALLOPIAN TUBES AND OVARIES, NEGATIVE FOR CARCINOMA - SEE ONCOLOGY TABLE 2. Lymph node, biopsy, right pelvic - METASTATIC CARCINOMA TO  ONE OF TWO LYMPH NODES (1/2) Microscopic Comment 1. (v4.1.0.0) UTERUS, CARCINOMA OR CARCINOSARCOMA Procedure: Total hysterectomy with bilateral  salpingo-oophorectomy Histologic type: Mixed high-grade serous and endometrioid adenocarcinoma Histologic Grade: NA Myometrial invasion: Present Depth of invasion: 19 mm Myometrial thickness: 20 mm Uterine Serosa Involvement: Not identified Cervical stromal involvement: Present Extent of involvement of other organs: NA Lymphovascular invasion: Not identified Regional Lymph Nodes: Examined: 0 Sentinel 2 Non-sentinel 2 Total Lymph nodes with metastasis: 1 Isolated tumor cells (< 0.2 mm): 0 Micrometastasis: (> 0.2 mm and < 2.0 mm): 1 Macrometastasis: (> 2.0 mm): 0 Extracapsular extension: Not identified Tumor block for ancillary studies: 67F MMR / MSI testing: Pending Pathologic Stage Classification (pTNM, AJCC 8th edition): pT2, pN45m FIGO Stage: IIIC1 Representative Tumor Block: 1A, 1C      Radiology: Ct Abdomen Pelvis W Contrast  Result Date: 02/03/2019 CLINICAL DATA:  Follow-up endometrial malignancy EXAM: CT ABDOMEN AND PELVIS WITH CONTRAST TECHNIQUE: Multidetector CT imaging of the abdomen and pelvis was performed using the standard protocol following bolus administration of intravenous contrast. CONTRAST:  1070mOMNIPAQUE IOHEXOL 300 MG/ML SOLN, additional oral enteric contrast COMPARISON:  11/27/2018, 09/20/2018 FINDINGS: Lower chest: No acute abnormality. Hepatobiliary: No focal liver abnormality is seen. Status post cholecystectomy. No biliary dilatation. Pancreas: Unremarkable. No pancreatic ductal dilatation or surrounding inflammatory changes. Spleen: Normal in size without focal abnormality. Adrenals/Urinary Tract: Adrenal glands are unremarkable. Kidneys are normal, without renal calculi, focal lesion, or hydronephrosis. Bladder is unremarkable. Stomach/Bowel: Stomach is within normal limits. Appendix appears normal. No evidence of bowel wall thickening, distention, or inflammatory changes. Sigmoid diverticula. Vascular/Lymphatic: Scattered atherosclerosis. Numerous  retroperitoneal, iliac, and pelvic lymph nodes are stable or slightly decreased in size compared to immediate prior examination dated 11/27/2018 and significantly decreased in size compared to exam dated 09/20/2018. Reproductive: Unchanged thickening of the endometrium (series 7, image 57). Other: No abdominal wall hernia or abnormality. No abdominopelvic ascites. Musculoskeletal: No acute or significant osseous findings. IMPRESSION: 1. Numerous retroperitoneal, iliac, and pelvic lymph nodes are stable or slightly decreased in size compared to immediate prior examination dated 11/27/2018 and significantly decreased in size compared to exam dated 09/20/2018. 2.  Unchanged thickening of the endometrium. 3. Other chronic, incidental, and postoperative findings as detailed above. Electronically Signed   By: AlEddie Candle.D.   On: 02/03/2019 09:18  Numerous retroperitoneal, iliac, and pelvic lymph nodes are stable or slightly decreased in size compared to immediate prior examination dated 11/27/2018 and significantly decreased in size compared to exam dated 09/20/2018.  Unchanged thickening of the endometrium. Other chronic, incidental, and postoperative findings as detailed above.  Outpatient Encounter Medications as of 03/11/2019  Medication Sig  . diphenhydrAMINE (BENADRYL) 25 mg capsule Take 100 mg by mouth 2 (two) times a day.  . gabapentin (NEURONTIN) 400 MG capsule Take 800 mg by mouth 3 (three) times daily.   . hydrochlorothiazide (MICROZIDE) 12.5 MG capsule Take 12.5 mg by mouth daily.  . Marland Kitchenidocaine-prilocaine (EMLA) cream Apply to affected area once (Patient taking differently: Apply 1 application topically daily as needed (prior to port being accessed.). Apply to affected area once)  . losartan (COZAAR) 50 MG tablet Take 50 mg by mouth daily.  . Marland KitchenxyCODONE-acetaminophen (PERCOCET) 10-325 MG tablet Take 1-2 tablets by mouth every 6 (six) hours as needed for pain.  . [DISCONTINUED] cetirizine (KLS  ALLER-TEC) 10 MG tablet Take 10 mg by mouth every other day.  . [DISCONTINUED] ibuprofen (ADVIL) 600 MG tablet Take 1 tablet (600 mg total) by mouth  every 6 (six) hours as needed. For AFTER surgery  . [DISCONTINUED] loratadine (CLARITIN) 10 MG tablet Take 10 mg by mouth every other day.   . [DISCONTINUED] oxyCODONE (OXY IR/ROXICODONE) 5 MG immediate release tablet Take 1 tablet (5 mg total) by mouth every 4 (four) hours as needed for severe pain or breakthrough pain. For after surgery, do not take and drive  . [DISCONTINUED] senna-docusate (SENOKOT-S) 8.6-50 MG tablet Take 2 tablets by mouth at bedtime. For AFTER surgery, hold for loose stools   No facility-administered encounter medications on file as of 03/11/2019.    No Known Allergies  Past Medical History:  Diagnosis Date  . Arthritis    Knees and ankles fussed rt ankle  . Dyspnea    with excertion  . Hypertension   . Neuromuscular disorder ([place]) 1992   pinched nerve after back surgery numbness Lt leg lateral and last tow toes.   Past Surgical History:  Procedure Laterality Date  . ANKLE SURGERY Right    right fusion  . BACK SURGERY     5 surgeries at [facility]; has screws and a "cage"  . CARPAL TUNNEL RELEASE Bilateral   . COLONOSCOPY    . IR IMAGING GUIDED PORT INSERTION  10/01/2018  . KNEE SURGERY Left   . LAPAROSCOPIC CHOLECYSTECTOMY    . LUMBAR LAMINECTOMY/DECOMPRESSION MICRODISCECTOMY Left 08/06/2013   Procedure: LUMBAR LAMINECTOMY/DECOMPRESSION MICRODISCECTOMY 1 LEVEL;  Surgeon: Ophelia Charter, MD;  Location: [facility];  Service: Neurosurgery;  Laterality: Left;  redo LEFT Lumbar Five-Sacral One diskectomy  . LYMPH NODE BIOPSY N/A 02/12/2019   Procedure: RIGHT PELVIC LYMPH NODE DISSECTION;  Surgeon: Everitt Amber, MD;  Location: [facility];  Service: Gynecology;  Laterality: N/A;  . PLACEMENT OF LUMBAR DRAIN N/A 08/07/2013   Procedure: PLACEMENT OF LUMBAR DRAIN;  Surgeon: Ophelia Charter, MD;  Location: [facility];  Service:  Neurosurgery;  Laterality: N/A;  . ROBOTIC ASSISTED TOTAL HYSTERECTOMY WITH BILATERAL SALPINGO OOPHERECTOMY Bilateral 02/12/2019   Procedure: XI ROBOTIC ASSISTED TOTAL HYSTERECTOMY WITH BILATERAL SALPINGO OOPHORECTOMY;  Surgeon: Everitt Amber, MD;  Location: [facility];  Service: Gynecology;  Laterality: Bilateral;  . TONSILLECTOMY    . TUBAL LIGATION  1982        Past Gynecological History:   GYNECOLOGIC HISTORY:  . No LMP recorded. Patient is postmenopausal. 68 yo . Menarche: 68 years old . P 2 . Contraceptive none . HRT none  . Last Pap referral and then states piror to this was 2 years ago; always normal Family Hx:  Family History  Problem Relation Age of Onset  . Alzheimer's disease Mother   . Colon cancer Father 69  . Heart Problems Father   . Diabetes Maternal Aunt    Social Hx:  Marland Kitchen Tobacco use: none . Alcohol use: holidays . Illicit Drug use: none . Illicit IV Drug use: none    Review of Systems: Review of Systems  Musculoskeletal: Positive for arthralgias and back pain.  All other systems reviewed and are negative.   Vitals:  Vitals:   03/11/19 1036  BP: (!) 142/92  Pulse: 83  Temp: 98.7 F (37.1 C)  SpO2: 99%   Vitals:   03/11/19 1036  Weight: 298 lb 9.6 oz (135.4 kg)  Height: '5\' 3"'  (1.6 m)   Body mass index is 52.89 kg/m.  Physical Exam: General :  Obese, Well developed, 68 y.o., female in no apparent distress HEENT:  Normocephalic/atraumatic, symmetric, EOMI, eyelids normal Neck:   Supple, no masses.  Lymphatics:  No cervical/ submandibular/ supraclavicular/ infraclavicular/ inguinal adenopathy Respiratory:  Respirations unlabored, no use of accessory muscles CV:   Deferred Breast:  Deferred Musculoskeletal: No CVA tenderness, normal muscle strength. Abdomen:  Obese, Soft, non-tender and nondistended. No evidence of hernia. No masses. Incisions well healed.  Extremities:  Obese. No lymphedema, no erythema, non-tender. Skin:   Normal  inspection Neuro/Psych:  No focal motor deficit, no abnormal mental status. Normal gait. Normal affect. Alert and oriented to person, place, and time  Genito Urinary: Vaginal cuff smooth, in tact, no blood, no lesions.  Rectovaginal:  Good tone, no masses, no cul de sac nodularity, no parametrial involvement or nodularity.    30 minutes of direct face to face counseling time was spent with the patient. This included discussion about prognosis, therapy recommendations and postoperative side effects and are beyond the scope of routine postoperative care.  Cc: Paula Compton, MD (Referring Ob/Gyn) Ronita Hipps, MD  (PCP)

## 2019-03-11 NOTE — Telephone Encounter (Signed)
Called and given 6/2 appt for echo at [facility]. Instructed to arrive a 1045 at admitting for 1100 appt. She verbalized understanding.

## 2019-03-12 ENCOUNTER — Telehealth: Payer: Self-pay | Admitting: Hematology and Oncology

## 2019-03-12 ENCOUNTER — Encounter: Payer: Self-pay | Admitting: Hematology and Oncology

## 2019-03-12 NOTE — Assessment & Plan Note (Signed)
Due to morbid obesity, I plan to reduce the dose of Herceptin upfront.

## 2019-03-12 NOTE — Assessment & Plan Note (Signed)
She is debilitated since surgery I recommend referral back to cancer rehab for physical therapy and she agreed

## 2019-03-12 NOTE — Progress Notes (Signed)
[place] OFFICE PROGRESS NOTE  Patient Care Team: Ronita Hipps, MD as PCP - General (Family Medicine) Nicholaus Bloom, MD (Anesthesiology)  ASSESSMENT & PLAN:  Endometrial cancer [facility]) I have reviewed the surgical report and her case was recently discussed at the GYN oncology tumor board She has residual disease after treatment but did not progress overall We discussed a targeted approach She tested strongly positive for estrogen and progesterone receptor, along with HER-2 We discussed the importance of weight loss strategy to target the estrogen and progesterone receptors From the HER-2 perspective, we discussed the plan to add trastuzumab in addition to carboplatin and Taxol  We reviewed the NCCN guidelines The treatment is based on publication as follows: We discussed the role of chemotherapy. The intent is of cureative intent.  Randomized Phase II Trial of Carboplatin-Paclitaxel Versus Carboplatin-Paclitaxel-Trastuzumab in Uterine Serous Carcinomas That Overexpress Human Epidermal Growth Factor Receptor 2/neu  Yvonne Kendall. Mickel Duhamel. Roque, Eric [place], Natalia [place], [place], [place], [place] K. Josiah Lobo, [address] Mickel Baas [place]. [place], Floor Backes, [place], Babak [place], Dirk [place], Sharmon Leyden, Alyssa [place], [address], Stefania [place], Masoud [place], Babak [place], [place], Dan-Arin Silasi, Jarvis Morgan, and Heloise Beecham. Santin  Purpose Uterine serous carcinoma is a rare, aggressive variant of endometrial cancer. Trastuzumab is a humanized monoclonal antibody that targets human epidermal growth factor receptor 2 (HER2)/neu, a receptor overexpressed in 30%of uterine serous carcinoma. This multicenter, randomized phase II trial compared carboplatin-paclitaxel with and without trastuzumab in patients with advanced or recurrent uterine serous carcinoma who overexpress HER2/neu.  Methods Eligible  patients had primary stage III or IV or recurrent HER2/neu-positive disease. Participants were randomly assigned to receive carboplatin-paclitaxel (control arm) for six cycles with or without intravenous trastuzumab (experimental arm) until progression or unacceptable toxicity. The primary end point was progression-free survival, which was assessed for differences between treatment arms via one-sided log-rank tests.  Results From August 2011 to March 2017, 61 patients were randomly assigned. Forty progression-free survival-related events occurred among 58 evaluable participants. Among all patients, median progression-free survivalwas 8.56month (control) versus 12.660month(experimental; P = .005; hazard ratio [HR], 0.44; 90% CI, 0.26 to 0.76). Similarly, median progression-free survival was 9.3 (control) versus 17.9 (experimental)months among 41 patients with stage III or IV disease undergoing primary treatment (P = .013; HR, 0.40; 90% CI, 0.20 to 0.80) and 6.0 (control) versus 9.2 months (experimental), respectively, among 17 patients with recurrent disease (P = .003; HR, 0.14; 90% CI, 0.04 to 0.53). Toxicitywas not different between treatment arms, and no unexpected safety signals emerged.   Conclusion Addition of trastuzumab to carboplatin-paclitaxel was well tolerated and increased progression-free survival. These encouraging results deserve further investigation to determine their impact on overall survival in patients with advanced or recurrent uterine serous carcinoma who overexpress HER2/neu.  J Clin OnVWUJW [id number] 2018 by American Society of Clinical Oncology   We discussed some of the risks, benefits, side-effects of carboplatin, Taxol and Herceptin. Treatment is intravenous, every 3 weeks x 3 cycles, followed by Herceptin indefinitely if she has no signs of cancer progression  Some of the short term side-effects included, though not limited to, including weight loss, life threatening  infections, risk of allergic reactions, need for transfusions of blood products, nausea, vomiting, change in bowel habits, congestive heart failure, loss of hair, admission to hospital for various reasons, and risks of death.   Long term side-effects are also discussed including risks of infertility, permanent  damage to nerve function, hearing loss, chronic fatigue, kidney damage with possibility needing hemodialysis, and rare secondary malignancy including bone marrow disorders.  The patient is aware that the response rates discussed earlier is not guaranteed.  After a long discussion, patient made an informed decision to proceed with the prescribed plan of care.   Patient education material was dispensed. We discussed premedication with dexamethasone before chemotherapy. She does not need prophylactic GCSF support We discussed the importance of pretreatment echocardiogram After 3 cycles of treatment, I plan to repeat CT imaging  Morbid (severe) obesity due to excess calories ([place]) Due to morbid obesity, I plan to reduce the dose of Herceptin upfront.  Neuropathy due to medical condition [facility]) Her previous neuropathy has improved I will continue on similar dose adjustment as before  Physical debility She is debilitated since surgery I recommend referral back to cancer rehab for physical therapy and she agreed   Orders Placed This Encounter  Procedures  . Ambulatory referral to Physical Therapy    Referral Priority:   Routine    Referral Type:   Physical Medicine    Referral Reason:   Specialty Services Required    Requested Specialty:   Physical Therapy    Number of Visits Requested:   1  . ECHOCARDIOGRAM COMPLETE    Standing Status:   Future    Standing Expiration Date:   06/10/2020    Order Specific Question:   Where should this test be performed    Answer:   [facility]    Order Specific Question:   Perflutren DEFINITY (image enhancing agent) should be administered unless  hypersensitivity or allergy exist    Answer:   Administer Perflutren    Order Specific Question:   Reason for exam-Echo    Answer:   Chemo  V67.2 / Z09    INTERVAL HISTORY: Please see below for problem oriented charting. 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 A Pap was done 09/02/18 showign ASC-H. She was referred onto GYN and seen by Dr. Paula Compton     09/09/2018 Procedure    She underwent endometrial biopsy    09/12/2018 Pathology Results    Endometrial biopsy positive for adenocarcinoma    09/22/2018 Imaging    Ct abdomen and pelvis showed: Diffuse endometrial thickening, consistent with primary endometrial carcinoma.  Pelvic and abdominal retroperitoneal and retrocrural lymphadenopathy, consistent with metastatic disease.  Moderate hepatic steatosis.    09/25/2018 Cancer Staging    Staging form: Corpus Uteri - Carcinoma and Carcinosarcoma, AJCC 8th Edition - Pathologic: Stage IIIC2 (pT2, pN2, cM0) - Signed by Heath Lark, MD on 02/23/2019    10/01/2018 Procedure    Successful placement of a right IJ approach Power Port with ultrasound and fluoroscopic guidance. The catheter is ready for use.    10/02/2018 Tumor Marker    Patient's tumor was tested for the following markers: CA-125 Results of the tumor marker test revealed 578    10/03/2018 - 01/16/2019 Chemotherapy    The patient had carboplatin and Taxol with dose adjustment due to neuropathy x 6  cycles    10/24/2018 Tumor Marker    Patient's tumor was tested for the following markers:  CA-125 Results of the tumor marker test revealed 296     Genetic Testing    Patient has genetic testing done for MSI/MMR. Results revealed MSI stable and MMR normal on [facility] [phone].    11/14/2018 Tumor Marker    Patient's tumor was tested for the following markers: CA-125 Results of the tumor marker test revealed 81    11/27/2018 Imaging    1. Significant decrease in abdominal retroperitoneal and bilateral iliac lymphadenopathy since previous study. 2. Significant decrease in abnormal endometrial soft tissue density since prior study. 3. No new or progressive metastatic disease identified.    12/05/2018 Tumor Marker    Patient's tumor was tested for the following markers: CA-125 Results of the tumor marker test revealed 46.3    12/26/2018 Tumor Marker    Patient's tumor was tested for the following markers: CA-125 Results of the tumor marker test revealed 38.2    01/16/2019 Tumor Marker    Patient's tumor was tested for the following markers: CA-125 Results of the tumor marker test revealed 30.8    02/03/2019 Imaging    1. Numerous retroperitoneal, iliac, and pelvic lymph nodes are stable or slightly decreased in size compared to immediate prior examination dated 11/27/2018 and significantly decreased in size compared to exam dated 09/20/2018.  2.  Unchanged thickening of the endometrium.  3. Other chronic, incidental, and postoperative findings as detailed above.    02/12/2019 Imaging    1. Uterus +/- tubes/ovaries, neoplastic, cervix, bilateral fallopian tubes and ovaries - MIXED HIGH-GRADE SEROUS AND ENDOMETRIAL ADENOCARCINOMA, 6.5 CM. SEE NOTE - CARCINOMA INVOLVES ENTIRE ENDOMETRIUM, ANTERIOR AND POSTERIOR LOWER UTERINE SEGMENTS AND ANTERIOR AND POSTERIOR CERVIX. - CARCINOMA INVADES FOR A DEPTH OF 1.9 CM WHERE MYOMETRIAL THICKNESS IS 2.0 CM (GREATER THAN 50% MYOMETRIAL INVASION) - CARCINOMA IS 1 MM FROM THE CERVICAL MARGIN - NEGATIVE FOR LYMPHOVASCULAR OR PERINEURAL  INVASION - BILATERAL UNREMARKABLE FALLOPIAN TUBES AND OVARIES, NEGATIVE FOR CARCINOMA - SEE ONCOLOGY TABLE 2. Lymph node, biopsy, right pelvic - METASTATIC CARCINOMA TO ONE OF TWO LYMPH NODES (1/2) Microscopic Comment 1. (v4.1.0.0) UTERUS, CARCINOMA OR CARCINOSARCOMA Procedure: Total hysterectomy with bilateral salpingo-oophorectomy Histologic type: Mixed high-grade serous and endometrioid adenocarcinoma Histologic Grade: NA Myometrial invasion: Present Depth of invasion: 19 mm Myometrial thickness: 20 mm Uterine Serosa Involvement: Not identified Cervical stromal involvement: Present Extent of involvement of other organs: NA Lymphovascular invasion: Not identified Regional Lymph Nodes: Examined: 0 Sentinel 2 Non-sentinel 2 Total Lymph nodes with metastasis: 1 Isolated tumor cells (< 0.2 mm): 0 Micrometastasis: (> 0.2 mm and < 2.0 mm): 1 Macrometastasis: (> 2.0 mm): 0 Extracapsular extension: Not identified Tumor block for ancillary studies: 24F MMR / MSI testing: Pending Pathologic Stage Classification (pTNM, AJCC 8th edition): pT2, pN14m FIGO Stage: IIIC1 Representative Tumor Block: 1A, 1C (v4.1.0.0) Diagnosis Note 1. Immunohistochemical stains for p16, p53, ER and Ki-67 show a pattern of staining consistent with mixed high-grade serous and endometrial adenocarcinoma. Dr. KLyndon Codehas reviewed this case and concurs with the above interpretation. A molecular study for microsatellite instability (MSI) and immunostains for MMR-related proteins are pending and will be reported in an addendum.    02/12/2019 Surgery    Pre-operative Diagnosis: endometrial cancer stage IIIC2, s/p neoadjuvant chemotherapy, extreme obesity (BMI 53kg/m2)  Operation: Robotic-assisted laparoscopic total hysterectomy with bilateral salpingoophorectomy, right pelvic lymphadenectomy. 22 modifier for extreme obesity  Surgeon: RDonaciano Eva Assistant Surgeon:  Lahoma Crocker MD  Operative  Findings:  : extreme obesity, BMI 53kg/m2, with significant retroperitoneal and intraperitoneal adiposity. Obesity necessitated an additional hour of operating time to create safe exposure. Obesity required additional personnel in the operating room for positioning and retraction. Severe obesity substantially increased the complexity of the procedure. 8cm uterus, grossly normal, normal appearing cervix. Retroperitoneal fibrosis consistent with nodal positivity. Clinically suspicious right pelvic lymph node. Unable to completely visualize retroperitoneal nodes due to extreme obesity.     02/12/2019 Pathology Results    IMMUNOHISTOCHEMICAL AND MORPHOMETRIC ANALYSIS PERFORMED MANUALLY The tumor cells are POSITIVE for Her2 (3+). Estrogen Receptor: 70%, POSITIVE, STRONG STAINING INTENSITY Progesterone Receptor: 70%, POSITIVE, STRONG STAINING INTENSITY Proliferation Marker Ki67: 20% REFERENCE RANGE ESTROGEN RECEPTOR NEGATIVE 0% POSITIVE =>1% REFERENCE RANGE PROGESTERONE RECEPTOR NEGATIVE 0% POSITIVE =>1% All controls stained appropriately  1. Uterus +/- tubes/ovaries, neoplastic, cervix, bilateral fallopian tubes and ovaries - MIXED HIGH-GRADE SEROUS AND ENDOMETRIAL ADENOCARCINOMA, 6.5 CM. SEE NOTE - CARCINOMA INVOLVES ENTIRE ENDOMETRIUM, ANTERIOR AND POSTERIOR LOWER UTERINE SEGMENTS AND ANTERIOR AND POSTERIOR CERVIX. - CARCINOMA INVADES FOR A DEPTH OF 1.9 CM WHERE MYOMETRIAL THICKNESS IS 2.0 CM (GREATER THAN 50% MYOMETRIAL INVASION) - CARCINOMA IS 1 MM FROM THE CERVICAL MARGIN - NEGATIVE FOR LYMPHOVASCULAR OR PERINEURAL INVASION - BILATERAL UNREMARKABLE FALLOPIAN TUBES AND OVARIES, NEGATIVE FOR CARCINOMA - SEE ONCOLOGY TABLE 2. Lymph node, biopsy, right pelvic - METASTATIC CARCINOMA TO ONE OF TWO LYMPH NODES (1/2) Microscopic Comment 1. (v4.1.0.0) UTERUS, CARCINOMA OR CARCINOSARCOMA Procedure: Total hysterectomy with bilateral salpingo-oophorectomy Histologic type: Mixed high-grade serous and  endometrioid adenocarcinoma Histologic Grade: NA Myometrial invasion: Present Depth of invasion: 19 mm Myometrial thickness: 20 mm Uterine Serosa Involvement: Not identified Cervical stromal involvement: Present Extent of involvement of other organs: NA Lymphovascular invasion: Not identified Regional Lymph Nodes: Examined: 0 Sentinel 2 Non-sentinel 2 Total Lymph nodes with metastasis: 1 Isolated tumor cells (< 0.2 mm): 0 Micrometastasis: (> 0.2 mm and < 2.0 mm): 1 Macrometastasis: (> 2.0 mm): 0 Extracapsular extension: Not identified Tumor block for ancillary studies: 52F MMR / MSI testing: Pending Pathologic Stage Classification (pTNM, AJCC 8th edition): pT2, pN19m FIGO Stage: IIIC1 Representative Tumor Block: 1A, 1C    03/20/2019 -  Chemotherapy    The patient had trastuzumab (HERCEPTIN) 693 mg in sodium chloride 0.9 % 250 mL chemo infusion, 8 mg/kg = 693 mg, Intravenous,  Once, 0 of 5 cycles  for chemotherapy treatment.      REVIEW OF SYSTEMS:   Constitutional: Denies fevers, chills or abnormal weight loss Eyes: Denies blurriness of vision Ears, nose, mouth, throat, and face: Denies mucositis or sore throat Respiratory: Denies cough, dyspnea or wheezes Cardiovascular: Denies palpitation, chest discomfort or lower extremity swelling Gastrointestinal:  Denies nausea, heartburn or change in bowel habits Skin: Denies abnormal skin rashes Lymphatics: Denies new lymphadenopathy or easy bruising Behavioral/Psych: Mood is stable, no new changes  All other systems were reviewed with the patient and are negative.  I have reviewed the past medical history, past surgical history, social history and family history with the patient and they are unchanged from previous note.  ALLERGIES:  has No Known Allergies.  MEDICATIONS:  Current Outpatient Medications  Medication Sig Dispense Refill  . ondansetron (ZOFRAN) 8 MG tablet Take 8 mg by mouth every 8 (eight) hours as needed.    .  prochlorperazine (COMPAZINE) 10 MG tablet Take 10 mg by mouth every 6 (six) hours as needed.    .Marland Kitchendexamethasone (DECADRON) 4 MG tablet Take  3 tabs at the night before and 3 tab the morning of chemotherapy, every 3 weeks, by mouth 20 tablet 0  . diphenhydrAMINE (BENADRYL) 25 mg capsule Take 100 mg by mouth 2 (two) times a day.    . gabapentin (NEURONTIN) 400 MG capsule Take 800 mg by mouth 3 (three) times daily.   1  . hydrochlorothiazide (MICROZIDE) 12.5 MG capsule Take 12.5 mg by mouth daily.  0  . lidocaine-prilocaine (EMLA) cream Apply to affected area once (Patient taking differently: Apply 1 application topically daily as needed (prior to port being accessed.). Apply to affected area once) 30 g 3  . losartan (COZAAR) 50 MG tablet Take 50 mg by mouth daily.  2  . oxyCODONE-acetaminophen (PERCOCET) 10-325 MG tablet Take 1-2 tablets by mouth every 6 (six) hours as needed for pain.     No current facility-administered medications for this visit.     PHYSICAL EXAMINATION: ECOG PERFORMANCE STATUS: 2 - Symptomatic, <50% confined to bed  Vitals:   03/11/19 1143  BP: (!) 142/92  Pulse: 99  Resp: 18  Temp: 98.7 F (37.1 C)  SpO2: 99%   Filed Weights   03/11/19 1143  Weight: 298 lb (135.2 kg)    GENERAL:alert, no distress and comfortable SKIN: skin color, texture, turgor are normal, no rashes or significant lesions EYES: normal, Conjunctiva are pink and non-injected, sclera clear OROPHARYNX:no exudate, no erythema and lips, buccal mucosa, and tongue normal  NECK: supple, thyroid normal size, non-tender, without nodularity LYMPH:  no palpable lymphadenopathy in the cervical, axillary or inguinal LUNGS: clear to auscultation and percussion with normal breathing effort HEART: regular rate & rhythm and no murmurs and no lower extremity edema ABDOMEN:abdomen soft, non-tender and normal bowel sounds.  Noted well-healed surgical scar Musculoskeletal:no cyanosis of digits and no clubbing   NEURO: alert & oriented x 3 with fluent speech, no focal motor/sensory deficits  LABORATORY DATA:  I have reviewed the data as listed    Component Value Date/Time   NA 140 02/10/2019 1218   K 3.7 02/10/2019 1218   CL 103 02/10/2019 1218   CO2 28 02/10/2019 1218   GLUCOSE 130 (H) 02/10/2019 1218   BUN 15 02/10/2019 1218   CREATININE 0.72 02/10/2019 1218   CREATININE 0.82 01/16/2019 0840   CALCIUM 9.1 02/10/2019 1218   PROT 7.7 02/10/2019 1218   ALBUMIN 4.2 02/10/2019 1218   AST 21 02/10/2019 1218   AST 16 01/16/2019 0840   ALT 18 02/10/2019 1218   ALT 19 01/16/2019 0840   ALKPHOS 111 02/10/2019 1218   BILITOT 0.3 02/10/2019 1218   BILITOT 0.4 01/16/2019 0840   GFRNONAA >60 02/10/2019 1218   GFRNONAA >60 01/16/2019 0840   GFRAA >60 02/10/2019 1218   GFRAA >60 01/16/2019 0840    No results found for: SPEP, UPEP  Lab Results  Component Value Date   WBC 4.3 02/10/2019   NEUTROABS 6.1 01/16/2019   HGB 10.3 (L) 02/10/2019   HCT 31.9 (L) 02/10/2019   MCV 112.7 (H) 02/10/2019   PLT 127 (L) 02/10/2019      Chemistry      Component Value Date/Time   NA 140 02/10/2019 1218   K 3.7 02/10/2019 1218   CL 103 02/10/2019 1218   CO2 28 02/10/2019 1218   BUN 15 02/10/2019 1218   CREATININE 0.72 02/10/2019 1218   CREATININE 0.82 01/16/2019 0840      Component Value Date/Time   CALCIUM 9.1 02/10/2019 1218   ALKPHOS 111 02/10/2019  1218   AST 21 02/10/2019 1218   AST 16 01/16/2019 0840   ALT 18 02/10/2019 1218   ALT 19 01/16/2019 0840   BILITOT 0.3 02/10/2019 1218   BILITOT 0.4 01/16/2019 0840       All questions were answered. The patient knows to call the clinic with any problems, questions or concerns. No barriers to learning was detected.  I spent 30 minutes counseling the patient face to face. The total time spent in the appointment was 40 minutes and more than 50% was on counseling and review of test results  Heath Lark, MD 03/12/2019 8:04 AM

## 2019-03-12 NOTE — Assessment & Plan Note (Signed)
I have reviewed the surgical report and her case was recently discussed at the GYN oncology tumor board She has residual disease after treatment but did not progress overall We discussed a targeted approach She tested strongly positive for estrogen and progesterone receptor, along with HER-2 We discussed the importance of weight loss strategy to target the estrogen and progesterone receptors From the HER-2 perspective, we discussed the plan to add trastuzumab in addition to carboplatin and Taxol  We reviewed the NCCN guidelines The treatment is based on publication as follows: We discussed the role of chemotherapy. The intent is of cureative intent.  Randomized Phase II Trial of Carboplatin-Paclitaxel Versus Carboplatin-Paclitaxel-Trastuzumab in Uterine Serous Carcinomas That Overexpress Human Epidermal Growth Factor Receptor 2/neu  Yvonne Kendall. Mickel Duhamel. Roque, Eric [place], Natalia [place], [place], [place], [place] K. Josiah Lobo, [address] Mickel Baas [place]. [place], Floor Backes, [place], Babak [place], Dirk [place], Sharmon Leyden, Alyssa [place], [address] Stefania [place], [place], Babak [place], [place], Dan-Arin Silasi, Jarvis Morgan, and Heloise Beecham. Santin  Purpose Uterine serous carcinoma is a rare, aggressive variant of endometrial cancer. Trastuzumab is a humanized monoclonal antibody that targets human epidermal growth factor receptor 2 (HER2)/neu, a receptor overexpressed in 30%of uterine serous carcinoma. This multicenter, randomized phase II trial compared carboplatin-paclitaxel with and without trastuzumab in patients with advanced or recurrent uterine serous carcinoma who overexpress HER2/neu.  Methods Eligible patients had primary stage III or IV or recurrent HER2/neu-positive disease. Participants were randomly assigned to receive carboplatin-paclitaxel (control arm) for six cycles with or without intravenous  trastuzumab (experimental arm) until progression or unacceptable toxicity. The primary end point was progression-free survival, which was assessed for differences between treatment arms via one-sided log-rank tests.  Results From August 2011 to March 2017, 61 patients were randomly assigned. Forty progression-free survival-related events occurred among 58 evaluable participants. Among all patients, median progression-free survivalwas 8.44month (control) versus 12.674month(experimental; P = .005; hazard ratio [HR], 0.44; 90% CI, 0.26 to 0.76). Similarly, median progression-free survival was 9.3 (control) versus 17.9 (experimental)months among 41 patients with stage III or IV disease undergoing primary treatment (P = .013; HR, 0.40; 90% CI, 0.20 to 0.80) and 6.0 (control) versus 9.2 months (experimental), respectively, among 17 patients with recurrent disease (P = .003; HR, 0.14; 90% CI, 0.04 to 0.53). Toxicitywas not different between treatment arms, and no unexpected safety signals emerged.   Conclusion Addition of trastuzumab to carboplatin-paclitaxel was well tolerated and increased progression-free survival. These encouraging results deserve further investigation to determine their impact on overall survival in patients with advanced or recurrent uterine serous carcinoma who overexpress HER2/neu.  J Clin OnXFGHW 29:[id number] 2018 by American Society of Clinical Oncology   We discussed some of the risks, benefits, side-effects of carboplatin, Taxol and Herceptin. Treatment is intravenous, every 3 weeks x 3 cycles, followed by Herceptin indefinitely if she has no signs of cancer progression  Some of the short term side-effects included, though not limited to, including weight loss, life threatening infections, risk of allergic reactions, need for transfusions of blood products, nausea, vomiting, change in bowel habits, congestive heart failure, loss of hair, admission to hospital for various reasons,  and risks of death.   Long term side-effects are also discussed including risks of infertility, permanent damage to nerve function, hearing loss, chronic fatigue, kidney damage with possibility needing hemodialysis, and rare secondary malignancy including bone marrow disorders.  The patient is aware that the response rates discussed earlier  is not guaranteed.  After a long discussion, patient made an informed decision to proceed with the prescribed plan of care.   Patient education material was dispensed. We discussed premedication with dexamethasone before chemotherapy. She does not need prophylactic GCSF support We discussed the importance of pretreatment echocardiogram After 3 cycles of treatment, I plan to repeat CT imaging

## 2019-03-12 NOTE — Telephone Encounter (Signed)
Tried to reach regarding schedule, not sure if the phone disconnect or not but no voicemail came on

## 2019-03-12 NOTE — Assessment & Plan Note (Signed)
Her previous neuropathy has improved I will continue on similar dose adjustment as before

## 2019-03-13 ENCOUNTER — Ambulatory Visit: Payer: Medicare Other | Admitting: Hematology and Oncology

## 2019-03-13 ENCOUNTER — Other Ambulatory Visit: Payer: Self-pay | Admitting: Hematology and Oncology

## 2019-03-13 MED FILL — PROCHLORPERAZINE 10 MG TAB: 10 | 7 days supply | Qty: 30 | Fill #0

## 2019-03-13 MED FILL — ONDANSETRON HCL 8 MG TABLET: 8 | 10 days supply | Qty: 30 | Fill #1

## 2019-03-16 ENCOUNTER — Other Ambulatory Visit: Payer: Self-pay | Admitting: Hematology and Oncology

## 2019-03-17 ENCOUNTER — Ambulatory Visit (HOSPITAL_COMMUNITY)
Admission: RE | Admit: 2019-03-17 | Discharge: 2019-03-17 | Disposition: A | Discharge status: Home / Self Care | Payer: Medicare Other | Source: Ambulatory Visit | Attending: Hematology and Oncology | Admitting: Hematology and Oncology

## 2019-03-17 ENCOUNTER — Other Ambulatory Visit: Payer: Self-pay | Admitting: Hematology and Oncology

## 2019-03-17 ENCOUNTER — Other Ambulatory Visit: Payer: Self-pay

## 2019-03-17 ENCOUNTER — Inpatient Hospital Stay: Payer: Medicare Other | Attending: Hematology and Oncology

## 2019-03-17 DIAGNOSIS — Z5111 Encounter for antineoplastic chemotherapy: Secondary | ICD-10-CM | POA: Insufficient documentation

## 2019-03-17 DIAGNOSIS — M961 Postlaminectomy syndrome, not elsewhere classified: Secondary | ICD-10-CM | POA: Diagnosis not present

## 2019-03-17 DIAGNOSIS — C541 Malignant neoplasm of endometrium: Secondary | ICD-10-CM | POA: Diagnosis not present

## 2019-03-17 DIAGNOSIS — R5381 Other malaise: Secondary | ICD-10-CM | POA: Diagnosis not present

## 2019-03-17 DIAGNOSIS — Z79891 Long term (current) use of opiate analgesic: Secondary | ICD-10-CM | POA: Diagnosis not present

## 2019-03-17 DIAGNOSIS — Z79899 Other long term (current) drug therapy: Secondary | ICD-10-CM | POA: Diagnosis not present

## 2019-03-17 DIAGNOSIS — Z9221 Personal history of antineoplastic chemotherapy: Secondary | ICD-10-CM | POA: Diagnosis not present

## 2019-03-17 DIAGNOSIS — D61818 Other pancytopenia: Secondary | ICD-10-CM | POA: Diagnosis not present

## 2019-03-17 DIAGNOSIS — Z6841 Body Mass Index (BMI) 40.0 and over, adult: Secondary | ICD-10-CM | POA: Diagnosis not present

## 2019-03-17 DIAGNOSIS — G894 Chronic pain syndrome: Secondary | ICD-10-CM | POA: Diagnosis not present

## 2019-03-17 DIAGNOSIS — Z9181 History of falling: Secondary | ICD-10-CM | POA: Insufficient documentation

## 2019-03-17 DIAGNOSIS — M5416 Radiculopathy, lumbar region: Secondary | ICD-10-CM | POA: Diagnosis not present

## 2019-03-17 DIAGNOSIS — E669 Obesity, unspecified: Secondary | ICD-10-CM | POA: Diagnosis not present

## 2019-03-17 DIAGNOSIS — Z90710 Acquired absence of both cervix and uterus: Secondary | ICD-10-CM | POA: Diagnosis not present

## 2019-03-17 DIAGNOSIS — Z5112 Encounter for antineoplastic immunotherapy: Secondary | ICD-10-CM | POA: Insufficient documentation

## 2019-03-17 DIAGNOSIS — N950 Postmenopausal bleeding: Secondary | ICD-10-CM | POA: Diagnosis not present

## 2019-03-17 LAB — CBC WITH DIFFERENTIAL (CANCER CENTER ONLY)
Abs Immature Granulocytes: 0.01 10*3/uL (ref 0.00–0.07)
Basophils Absolute: 0 10*3/uL (ref 0.0–0.1)
Basophils Relative: 1 %
Eosinophils Absolute: 0.1 10*3/uL (ref 0.0–0.5)
Eosinophils Relative: 2 %
HCT: 37 % (ref 36.0–46.0)
Hemoglobin: 11.7 g/dL — ABNORMAL LOW (ref 12.0–15.0)
Immature Granulocytes: 0 %
Lymphocytes Relative: 38 %
Lymphs Abs: 2.1 10*3/uL (ref 0.7–4.0)
MCH: 34.5 pg — ABNORMAL HIGH (ref 26.0–34.0)
MCHC: 31.6 g/dL (ref 30.0–36.0)
MCV: 109.1 fL — ABNORMAL HIGH (ref 80.0–100.0)
Monocytes Absolute: 0.5 10*3/uL (ref 0.1–1.0)
Monocytes Relative: 8 %
Neutro Abs: 2.8 10*3/uL (ref 1.7–7.7)
Neutrophils Relative %: 51 %
Platelet Count: 190 10*3/uL (ref 150–400)
RBC: 3.39 MIL/uL — ABNORMAL LOW (ref 3.87–5.11)
RDW: 13.9 % (ref 11.5–15.5)
WBC Count: 5.5 10*3/uL (ref 4.0–10.5)
nRBC: 0 % (ref 0.0–0.2)

## 2019-03-17 LAB — CMP (CANCER CENTER ONLY)
ALT: 16 U/L (ref 0–44)
AST: 17 U/L (ref 15–41)
Albumin: 4 g/dL (ref 3.5–5.0)
Alkaline Phosphatase: 101 U/L (ref 38–126)
Anion gap: 10 (ref 5–15)
BUN: 12 mg/dL (ref 8–23)
CO2: 30 mmol/L (ref 22–32)
Calcium: 9.4 mg/dL (ref 8.9–10.3)
Chloride: 102 mmol/L (ref 98–111)
Creatinine: 0.76 mg/dL (ref 0.44–1.00)
GFR, Est AFR Am: >60 mL/min (ref >60)
GFR, Estimated: >60 mL/min (ref >60)
Glucose, Bld: 103 mg/dL — ABNORMAL HIGH (ref 70–99)
Potassium: 3.5 mmol/L (ref 3.5–5.1)
Sodium: 142 mmol/L (ref 135–145)
Total Bilirubin: 0.3 mg/dL (ref 0.3–1.2)
Total Protein: 7.6 g/dL (ref 6.5–8.1)

## 2019-03-17 NOTE — Progress Notes (Signed)
  Echocardiogram 2D Echocardiogram has been performed.  Kathryn [name] 03/17/2019, 12:35 PM

## 2019-03-18 ENCOUNTER — Telehealth: Payer: Self-pay | Admitting: *Deleted

## 2019-03-18 LAB — CA 125: Cancer Antigen (CA) 125: 43 U/mL — ABNORMAL HIGH (ref 0.0–38.1)

## 2019-03-18 NOTE — Telephone Encounter (Signed)
-----   Message from Heath Lark, MD sent at 03/18/2019  7:00 AM EDT ----- Regarding: echo is normal Pls let her know

## 2019-03-18 NOTE — Telephone Encounter (Signed)
Telephone call to patient advised results as directed below. Patient appreciated call.

## 2019-03-19 ENCOUNTER — Ambulatory Visit: Payer: Medicare Other | Attending: Hematology and Oncology | Admitting: Physical Therapy

## 2019-03-19 ENCOUNTER — Other Ambulatory Visit: Payer: Self-pay

## 2019-03-19 ENCOUNTER — Ambulatory Visit: Payer: Medicare Other | Admitting: Physical Therapy

## 2019-03-19 ENCOUNTER — Encounter: Payer: Self-pay | Admitting: Physical Therapy

## 2019-03-19 DIAGNOSIS — G8929 Other chronic pain: Secondary | ICD-10-CM | POA: Diagnosis not present

## 2019-03-19 DIAGNOSIS — M5441 Lumbago with sciatica, right side: Secondary | ICD-10-CM | POA: Insufficient documentation

## 2019-03-19 DIAGNOSIS — R262 Difficulty in walking, not elsewhere classified: Secondary | ICD-10-CM | POA: Insufficient documentation

## 2019-03-19 DIAGNOSIS — M6281 Muscle weakness (generalized): Secondary | ICD-10-CM | POA: Diagnosis not present

## 2019-03-19 DIAGNOSIS — M5442 Lumbago with sciatica, left side: Secondary | ICD-10-CM | POA: Diagnosis not present

## 2019-03-19 NOTE — Therapy (Signed)
[place], [place], [address] Phone: [phone]   Fax:  [phone]  Physical Therapy Treatment  Patient Details  Name: Kathryn [name] MRN: [id number] Date of Birth: [birth year] Referring Provider (PT): Dr. Alvy Bimler    Encounter Date: 03/19/2019  PT End of Session - 03/19/19 1333    Visit Number  8    Number of Visits  16    Date for PT Re-Evaluation  05/14/19    PT Start Time  1127    PT Stop Time  1225    PT Time Calculation (min)  58 min    Activity Tolerance  Patient tolerated treatment well    Behavior During Therapy  [facility] for tasks assessed/performed       Past Medical History:  Diagnosis Date  . Arthritis    Knees and ankles fussed rt ankle  . Dyspnea    with excertion  . Hypertension   . Neuromuscular disorder ([place]) 1992   pinched nerve after back surgery numbness Lt leg lateral and last tow toes.    Past Surgical History:  Procedure Laterality Date  . ANKLE SURGERY Right    right fusion  . BACK SURGERY     5 surgeries at [facility]; has screws and a "cage"  . CARPAL TUNNEL RELEASE Bilateral   . COLONOSCOPY    . IR IMAGING GUIDED PORT INSERTION  10/01/2018  . KNEE SURGERY Left   . LAPAROSCOPIC CHOLECYSTECTOMY    . LUMBAR LAMINECTOMY/DECOMPRESSION MICRODISCECTOMY Left 08/06/2013   Procedure: LUMBAR LAMINECTOMY/DECOMPRESSION MICRODISCECTOMY 1 LEVEL;  Surgeon: Ophelia Charter, MD;  Location: [facility];  Service: Neurosurgery;  Laterality: Left;  redo LEFT Lumbar Five-Sacral One diskectomy  . LYMPH NODE BIOPSY N/A 02/12/2019   Procedure: RIGHT PELVIC LYMPH NODE DISSECTION;  Surgeon: Everitt Amber, MD;  Location: [facility];  Service: Gynecology;  Laterality: N/A;  . PLACEMENT OF LUMBAR DRAIN N/A 08/07/2013   Procedure: PLACEMENT OF LUMBAR DRAIN;  Surgeon: Ophelia Charter, MD;  Location: [facility];  Service: Neurosurgery;  Laterality: N/A;  . ROBOTIC ASSISTED TOTAL HYSTERECTOMY WITH BILATERAL  SALPINGO OOPHERECTOMY Bilateral 02/12/2019   Procedure: XI ROBOTIC ASSISTED TOTAL HYSTERECTOMY WITH BILATERAL SALPINGO OOPHORECTOMY;  Surgeon: Everitt Amber, MD;  Location: [facility];  Service: Gynecology;  Laterality: Bilateral;  . TONSILLECTOMY    . TUBAL LIGATION  1982    There were no vitals filed for this visit.  Subjective Assessment - 03/19/19 1129    Subjective  I had to stop coming for the past four weeks because I had a hysterectomy and I was on restrictions. I have not done a lot other than walking.     Pertinent History  Endometrial cancer diagnosed September 01, 2018  First  chemotherapy Dec. 20  and will have 3 treatments , one every 3 weeks, then retesting and possibly surgery and then 3 more chemos. but that schedule may change  past history includes 6 back surgeries from L1 down starting in 1974 due to degenerative disc disease from the neck down She had an MRI in May of 2018 and was told there were so many areas of compression that it was not able to tell where leg pain was coming from.  She is followed by Dr. Koleen Nimrod at [facility] and she has epidurals but is not able have them anymore because there is no space for  the needle     Limitations  Sitting;Walking    How long can you  sit comfortably?  45 minutes tries to get up every 30 minutes    How long can you stand comfortably?  45 minutes gets pain in left leg     How long can you walk comfortably?  pt states she could probably walk about 20 minutes before stopping, pt has to stop due to shortness of breath    Patient Stated Goals  to get back in the water again and go to the grocery store and not be out of breath    Currently in Pain?  Yes    Pain Score  5     Pain Location  Leg    Pain Orientation  Left    Pain Descriptors / Indicators  Dull;Numbness    Pain Type  Neuropathic pain    Pain Onset  More than a month ago    Pain Frequency  Constant    Aggravating Factors   sitting or standing for long periods    Pain  Relieving Factors  medication and lying in bed on side    Effect of Pain on Daily Activities  difficult to feel floor when walking, hard to walk for long distances         [facility] PT Assessment - 03/19/19 0001      Transfers   Comments  30 sec sit to stand: 9 reps with increased shortness of breath noted      Timed Up and Go Test   Normal TUG (seconds)  16.1   no cane                            PT Short Term Goals - 10/16/18 1549      PT SHORT TERM GOAL #1   Title  Pt will report she is able to do a home exercise program and document her progress at home     Time  4    Period  Weeks    Status  New        PT Long Term Goals - 03/19/19 1149      PT LONG TERM GOAL #1   Title  Pt will decrease her time for Normal TUG to < 10 seconds indicating an improvment in balance and gait    Baseline  12.76; 12.25-2/19/20; did not retest today but pt reports sit-stand easier now as her legs are feeling stronger-12/17/18, 03/19/19- 16.1 seconds      PT LONG TERM GOAL #2   Title  Pt with increase # of reps of sit to stand in 30 seconds to < 13 indicating and increase in general strength    Baseline  11; 11 - 12/03/18; did not retest otday but pt is demonstrating greater ease with sit-stand multiple reps during session-12/17/18, 03/19/19- 9 reps pt very short of breath after this    Time  8    Period  Weeks    Status  On-going      PT LONG TERM GOAL #3   Title  Pt will report she is able to climb steps with 25% less difficulty so that she can get in and out of the pool for water exercise     Baseline  Pt still struggling with stairs so hasn't been back to pool-12/03/18; pt reports though she hasn't been back to pool yet her arms and legs are feeling stronger with all ADLs-3/4/2, 03/19/19- pt reports she is still having a lot of difficulty and has not really used  the steps at all    Time  8    Period  Weeks    Status  On-going            Plan - 03/19/19 1333    Clinical  Impression Statement  Pt had a hysterectomy and was unable to attend therapy due to restrictions for the past 4 weeks. Prior to that she was unable to attend due to [place]. Pt reports she has not been doing anything more than walking since her hysterectomy per doctors orders. She states she can tell she has lost some of her strength. Discussed pt's current chemo regime. Pt states she gets very sick after chemo and will be unable to come to therapy during those weeks. Reassessed goals today and pt has declined since last session due to recent decrease in activity due to surgery. Will keep same goals as they are all applicable. Pt is eager to begin water aerobics again and would like to be able to walk through the grocery store without increased shortness of breath. Pt would benefit from skilled PT services to increase LE strength, decrease fall risk and improve overall mobility.     Rehab Potential  Good    Clinical Impairments Affecting Rehab Potential  limited back mobility and diffuse degernative disc disease , pt will not be able to come the week of chemo     PT Frequency  1x / week   pt will not come the week of chemo   PT Duration  8 weeks    PT Treatment/Interventions  ADLs/Self Care Home Management;DME Instruction;Gait training;Stair training;Functional mobility training;Cognitive remediation;Neuromuscular re-education;Therapeutic exercise;Therapeutic activities;Patient/family education;Energy conservation    PT Next Visit Plan  Cont with balance and endurance activities, including functional UE and LE strength; work on progressing HEP once current one is no longer challenging, maybe instruct pt in circuit training type exercises.    Consulted and Agree with Plan of Care  Patient       Patient will benefit from skilled therapeutic intervention in order to improve the following deficits and impairments:  Abnormal gait, Decreased knowledge of use of DME, Pain, Postural dysfunction, Increased muscle  spasms, Decreased scar mobility, Decreased mobility, Decreased activity tolerance, Decreased endurance, Decreased strength, Impaired perceived functional ability, Obesity, Difficulty walking, Decreased balance, Decreased knowledge of precautions  Visit Diagnosis: Difficulty in walking  Muscle weakness (generalized)  Chronic bilateral low back pain with bilateral sciatica     Problem List Patient Active Problem List   Diagnosis Date Noted  . Hypokalemia due to loss of potassium 01/16/2019  . Steroid-induced hyperglycemia 11/14/2018  . Chemotherapy-induced nausea 10/24/2018  . Other constipation 10/24/2018  . Insomnia due to medical condition 10/07/2018  . Chronic back pain greater than 3 months duration 09/26/2018  . Neuropathy due to medical condition ([place]) 09/26/2018  . Physical debility 09/26/2018  . Morbid (severe) obesity due to excess calories ([place]) 09/26/2018  . Endometrial cancer ([place]) 09/24/2018  . Adenopathy 09/24/2018    Allyson Sabal [facility] 03/19/2019, 1:39 PM  [place], [place], [address] Phone: [phone]   Fax:  [phone]  Name: QUINCY PRISCO MRN: [id number] Date of Birth: Dec 11, 1950  Manus Gunning, PT 03/19/19 1:44 PM

## 2019-03-20 ENCOUNTER — Other Ambulatory Visit: Payer: Self-pay

## 2019-03-20 ENCOUNTER — Inpatient Hospital Stay: Payer: Medicare Other

## 2019-03-20 VITALS — BP 171/85 | HR 81 | Temp 97.6°F | Resp 17

## 2019-03-20 DIAGNOSIS — N950 Postmenopausal bleeding: Secondary | ICD-10-CM | POA: Diagnosis not present

## 2019-03-20 DIAGNOSIS — Z9221 Personal history of antineoplastic chemotherapy: Secondary | ICD-10-CM | POA: Diagnosis not present

## 2019-03-20 DIAGNOSIS — Z5112 Encounter for antineoplastic immunotherapy: Secondary | ICD-10-CM | POA: Diagnosis not present

## 2019-03-20 DIAGNOSIS — Z5111 Encounter for antineoplastic chemotherapy: Secondary | ICD-10-CM | POA: Diagnosis not present

## 2019-03-20 DIAGNOSIS — Z79899 Other long term (current) drug therapy: Secondary | ICD-10-CM | POA: Diagnosis not present

## 2019-03-20 DIAGNOSIS — D61818 Other pancytopenia: Secondary | ICD-10-CM | POA: Diagnosis not present

## 2019-03-20 DIAGNOSIS — C541 Malignant neoplasm of endometrium: Secondary | ICD-10-CM

## 2019-03-20 DIAGNOSIS — Z9181 History of falling: Secondary | ICD-10-CM | POA: Diagnosis not present

## 2019-03-20 DIAGNOSIS — R5381 Other malaise: Secondary | ICD-10-CM | POA: Diagnosis not present

## 2019-03-20 MED ORDER — DIPHENHYDRAMINE HCL 25 MG PO CAPS: 25.0000 mg | ORAL_CAPSULE | Freq: Once | ORAL | Status: DC

## 2019-03-20 MED ORDER — ACETAMINOPHEN 325 MG PO TABS
ORAL_TABLET | ORAL | Status: AC
  Filled 2019-03-20: qty 2

## 2019-03-20 MED ORDER — ACETAMINOPHEN 325 MG PO TABS
650.0000 mg | ORAL_TABLET | Freq: Once | ORAL | Status: AC
  Administered 2019-03-20: 650 mg via ORAL

## 2019-03-20 MED ORDER — SODIUM CHLORIDE 0.9 % IV SOLN
750.0000 mg | Freq: Once | INTRAVENOUS | Status: AC
  Administered 2019-03-20: 750 mg via INTRAVENOUS
  Filled 2019-03-20: qty 75

## 2019-03-20 MED ORDER — SODIUM CHLORIDE 0.9 % IV SOLN
Freq: Once | INTRAVENOUS | Status: AC
  Administered 2019-03-20: 12:00:00 via INTRAVENOUS
  Filled 2019-03-20: qty 5

## 2019-03-20 MED ORDER — FAMOTIDINE IN NACL 20-0.9 MG/50ML-% IV SOLN
20.0000 mg | Freq: Once | INTRAVENOUS | Status: AC
  Administered 2019-03-20: 20 mg via INTRAVENOUS

## 2019-03-20 MED ORDER — SODIUM CHLORIDE 0.9 % IV SOLN
Freq: Once | INTRAVENOUS | Status: AC
  Administered 2019-03-20: 09:00:00 via INTRAVENOUS
  Filled 2019-03-20: qty 250

## 2019-03-20 MED ORDER — HEPARIN SOD (PORK) LOCK FLUSH 100 UNIT/ML IV SOLN
500.0000 [IU] | Freq: Once | INTRAVENOUS | Status: AC | PRN
  Administered 2019-03-20: 500 [IU]
  Filled 2019-03-20: qty 5

## 2019-03-20 MED ORDER — SODIUM CHLORIDE 0.9% FLUSH
10.0000 mL | INTRAVENOUS | Status: DC | PRN
  Administered 2019-03-20: 10 mL
  Filled 2019-03-20: qty 10

## 2019-03-20 MED ORDER — PALONOSETRON HCL INJECTION 0.25 MG/5ML
INTRAVENOUS | Status: AC
  Filled 2019-03-20: qty 5

## 2019-03-20 MED ORDER — PALONOSETRON HCL INJECTION 0.25 MG/5ML
0.2500 mg | Freq: Once | INTRAVENOUS | Status: AC
  Administered 2019-03-20: 0.25 mg via INTRAVENOUS

## 2019-03-20 MED ORDER — TRASTUZUMAB CHEMO 150 MG IV SOLR
8.0000 mg/kg | Freq: Once | INTRAVENOUS | Status: AC
  Administered 2019-03-20: 693 mg via INTRAVENOUS
  Filled 2019-03-20: qty 33

## 2019-03-20 MED ORDER — FAMOTIDINE IN NACL 20-0.9 MG/50ML-% IV SOLN
INTRAVENOUS | Status: AC
  Filled 2019-03-20: qty 50

## 2019-03-20 MED ORDER — LIDOCAINE-PRILOCAINE 2.5-2.5 % EX CREA: TOPICAL_CREAM | CUTANEOUS | 3 refills | Status: DC

## 2019-03-20 MED ORDER — SODIUM CHLORIDE 0.9 % IV SOLN
105.0000 mg/m2 | Freq: Once | INTRAVENOUS | Status: AC
  Administered 2019-03-20: 258 mg via INTRAVENOUS
  Filled 2019-03-20: qty 43

## 2019-03-20 NOTE — Patient Instructions (Signed)
[facility] Discharge Instructions for Patients Receiving Chemotherapy  Today you received the following chemotherapy agents Taxol and Carboplatin and Herceptin  To help prevent nausea and vomiting after your treatment, we encourage you to take your nausea medication as directed.  If you develop nausea and vomiting that is not controlled by your nausea medication, call the clinic.   BELOW ARE SYMPTOMS THAT SHOULD BE REPORTED IMMEDIATELY:  *FEVER GREATER THAN 100.5 F  *CHILLS WITH OR WITHOUT FEVER  NAUSEA AND VOMITING THAT IS NOT CONTROLLED WITH YOUR NAUSEA MEDICATION  *UNUSUAL SHORTNESS OF BREATH  *UNUSUAL BRUISING OR BLEEDING  TENDERNESS IN MOUTH AND THROAT WITH OR WITHOUT PRESENCE OF ULCERS  *URINARY PROBLEMS  *BOWEL PROBLEMS  UNUSUAL RASH Items with * indicate a potential emergency and should be followed up as soon as possible.  Feel free to call the clinic should you have any questions or concerns. The clinic phone number is [phone].  Please show the [place] at check-in to the Emergency Department and triage nurse.   Trastuzumab injection for infusion What is this medicine? TRASTUZUMAB (tras TOO zoo mab) is a monoclonal antibody. It is used to treat breast cancer and stomach cancer. This medicine may be used for other purposes; ask your health care provider or pharmacist if you have questions. COMMON BRAND NAME(S): Herceptin, Calla Kicks, OGIVRI What should I tell my health care provider before I take this medicine? They need to know if you have any of these conditions: -heart disease -heart failure -lung or breathing disease, like asthma -an unusual or allergic reaction to trastuzumab, benzyl alcohol, or other medications, foods, dyes, or preservatives -pregnant or trying to get pregnant -breast-feeding How should I use this medicine? This drug is given as an infusion into a vein. It is administered in a hospital or clinic by a specially  trained health care professional. Talk to your pediatrician regarding the use of this medicine in children. This medicine is not approved for use in children. Overdosage: If you think you have taken too much of this medicine contact a poison control center or emergency room at once. NOTE: This medicine is only for you. Do not share this medicine with others. What if I miss a dose? It is important not to miss a dose. Call your doctor or health care professional if you are unable to keep an appointment. What may interact with this medicine? This medicine may interact with the following medications: -certain types of chemotherapy, such as daunorubicin, doxorubicin, epirubicin, and idarubicin This list may not describe all possible interactions. Give your health care provider a list of all the medicines, herbs, non-prescription drugs, or dietary supplements you use. Also tell them if you smoke, drink alcohol, or use illegal drugs. Some items may interact with your medicine. What should I watch for while using this medicine? Visit your doctor for checks on your progress. Report any side effects. Continue your course of treatment even though you feel ill unless your doctor tells you to stop. Call your doctor or health care professional for advice if you get a fever, chills or sore throat, or other symptoms of a cold or flu. Do not treat yourself. Try to avoid being around people who are sick. You may experience fever, chills and shaking during your first infusion. These effects are usually mild and can be treated with other medicines. Report any side effects during the infusion to your health care professional. Fever and chills usually do not happen with later  infusions. Do not become pregnant while taking this medicine or for 7 months after stopping it. Women should inform their doctor if they wish to become pregnant or think they might be pregnant. Women of child-bearing potential will need to have a  negative pregnancy test before starting this medicine. There is a potential for serious side effects to an unborn child. Talk to your health care professional or pharmacist for more information. Do not breast-feed an infant while taking this medicine or for 7 months after stopping it. Women must use effective birth control with this medicine. What side effects may I notice from receiving this medicine? Side effects that you should report to your doctor or health care professional as soon as possible: -allergic reactions like skin rash, itching or hives, swelling of the face, lips, or tongue -chest pain or palpitations -cough -dizziness -feeling faint or lightheaded, falls -fever -general ill feeling or flu-like symptoms -signs of worsening heart failure like breathing problems; swelling in your legs and feet -unusually weak or tired Side effects that usually do not require medical attention (report to your doctor or health care professional if they continue or are bothersome): -bone pain -changes in taste -diarrhea -joint pain -nausea/vomiting -weight loss This list may not describe all possible side effects. Call your doctor for medical advice about side effects. You may report side effects to FDA at 1-800-FDA-1088. Where should I keep my medicine? This drug is given in a hospital or clinic and will not be stored at home. NOTE: This sheet is a summary. It may not cover all possible information. If you have questions about this medicine, talk to your doctor, pharmacist, or health care provider.  2019 Elsevier/Gold Standard (2016-09-25 14:37:52)

## 2019-04-02 ENCOUNTER — Telehealth: Payer: Self-pay | Admitting: Oncology

## 2019-04-02 NOTE — Telephone Encounter (Signed)
Kathryn [name] called and said her phone has been out for a week and a half and she wants to verify her upcoming appointments and her labs from 03/17/19.  Reviewed appointments and lab results with her.  She verbalized understanding and agreement.

## 2019-04-06 ENCOUNTER — Other Ambulatory Visit: Payer: Self-pay | Admitting: Hematology and Oncology

## 2019-04-09 ENCOUNTER — Inpatient Hospital Stay: Payer: Medicare Other

## 2019-04-09 ENCOUNTER — Inpatient Hospital Stay: Payer: Medicare Other | Admitting: Hematology and Oncology

## 2019-04-09 ENCOUNTER — Other Ambulatory Visit: Payer: Self-pay | Admitting: Hematology and Oncology

## 2019-04-09 ENCOUNTER — Other Ambulatory Visit: Payer: Self-pay

## 2019-04-09 DIAGNOSIS — N950 Postmenopausal bleeding: Secondary | ICD-10-CM | POA: Diagnosis not present

## 2019-04-09 DIAGNOSIS — R5381 Other malaise: Secondary | ICD-10-CM | POA: Diagnosis not present

## 2019-04-09 DIAGNOSIS — Z9181 History of falling: Secondary | ICD-10-CM

## 2019-04-09 DIAGNOSIS — Z5111 Encounter for antineoplastic chemotherapy: Secondary | ICD-10-CM

## 2019-04-09 DIAGNOSIS — Z9221 Personal history of antineoplastic chemotherapy: Secondary | ICD-10-CM

## 2019-04-09 DIAGNOSIS — D61818 Other pancytopenia: Secondary | ICD-10-CM | POA: Diagnosis not present

## 2019-04-09 DIAGNOSIS — C541 Malignant neoplasm of endometrium: Secondary | ICD-10-CM | POA: Diagnosis not present

## 2019-04-09 DIAGNOSIS — Z90710 Acquired absence of both cervix and uterus: Secondary | ICD-10-CM

## 2019-04-09 DIAGNOSIS — Z6841 Body Mass Index (BMI) 40.0 and over, adult: Secondary | ICD-10-CM

## 2019-04-09 DIAGNOSIS — Z79899 Other long term (current) drug therapy: Secondary | ICD-10-CM

## 2019-04-09 DIAGNOSIS — E669 Obesity, unspecified: Secondary | ICD-10-CM

## 2019-04-09 DIAGNOSIS — Z5112 Encounter for antineoplastic immunotherapy: Secondary | ICD-10-CM

## 2019-04-09 LAB — CBC WITH DIFFERENTIAL (CANCER CENTER ONLY)
Abs Immature Granulocytes: 0.02 10*3/uL (ref 0.00–0.07)
Basophils Absolute: 0 10*3/uL (ref 0.0–0.1)
Basophils Relative: 0 %
Eosinophils Absolute: 0 10*3/uL (ref 0.0–0.5)
Eosinophils Relative: 0 %
HCT: 33.7 % — ABNORMAL LOW (ref 36.0–46.0)
Hemoglobin: 10.8 g/dL — ABNORMAL LOW (ref 12.0–15.0)
Immature Granulocytes: 1 %
Lymphocytes Relative: 43 %
Lymphs Abs: 1.7 10*3/uL (ref 0.7–4.0)
MCH: 34.4 pg — ABNORMAL HIGH (ref 26.0–34.0)
MCHC: 32 g/dL (ref 30.0–36.0)
MCV: 107.3 fL — ABNORMAL HIGH (ref 80.0–100.0)
Monocytes Absolute: 0.4 10*3/uL (ref 0.1–1.0)
Monocytes Relative: 11 %
Neutro Abs: 1.8 10*3/uL (ref 1.7–7.7)
Neutrophils Relative %: 45 %
Platelet Count: 99 10*3/uL — ABNORMAL LOW (ref 150–400)
RBC: 3.14 MIL/uL — ABNORMAL LOW (ref 3.87–5.11)
RDW: 14.6 % (ref 11.5–15.5)
WBC Count: 4 10*3/uL (ref 4.0–10.5)
nRBC: 0 % (ref 0.0–0.2)

## 2019-04-09 LAB — CMP (CANCER CENTER ONLY)
ALT: 20 U/L (ref 0–44)
AST: 16 U/L (ref 15–41)
Albumin: 3.7 g/dL (ref 3.5–5.0)
Alkaline Phosphatase: 103 U/L (ref 38–126)
Anion gap: 11 (ref 5–15)
BUN: 13 mg/dL (ref 8–23)
CO2: 25 mmol/L (ref 22–32)
Calcium: 8.8 mg/dL — ABNORMAL LOW (ref 8.9–10.3)
Chloride: 104 mmol/L (ref 98–111)
Creatinine: 0.8 mg/dL (ref 0.44–1.00)
GFR, Est AFR Am: >60 mL/min (ref >60)
GFR, Estimated: >60 mL/min (ref >60)
Glucose, Bld: 146 mg/dL — ABNORMAL HIGH (ref 70–99)
Potassium: 3.7 mmol/L (ref 3.5–5.1)
Sodium: 140 mmol/L (ref 135–145)
Total Bilirubin: 0.3 mg/dL (ref 0.3–1.2)
Total Protein: 7.3 g/dL (ref 6.5–8.1)

## 2019-04-10 ENCOUNTER — Encounter: Payer: Self-pay | Admitting: Hematology and Oncology

## 2019-04-10 ENCOUNTER — Other Ambulatory Visit: Payer: Self-pay

## 2019-04-10 ENCOUNTER — Inpatient Hospital Stay: Payer: Medicare Other

## 2019-04-10 VITALS — BP 144/80 | HR 83 | Temp 98.9°F | Resp 18

## 2019-04-10 DIAGNOSIS — N950 Postmenopausal bleeding: Secondary | ICD-10-CM | POA: Diagnosis not present

## 2019-04-10 DIAGNOSIS — D61818 Other pancytopenia: Secondary | ICD-10-CM | POA: Diagnosis not present

## 2019-04-10 DIAGNOSIS — Z9221 Personal history of antineoplastic chemotherapy: Secondary | ICD-10-CM | POA: Diagnosis not present

## 2019-04-10 DIAGNOSIS — Z9181 History of falling: Secondary | ICD-10-CM | POA: Diagnosis not present

## 2019-04-10 DIAGNOSIS — Z5112 Encounter for antineoplastic immunotherapy: Secondary | ICD-10-CM | POA: Diagnosis not present

## 2019-04-10 DIAGNOSIS — Z79899 Other long term (current) drug therapy: Secondary | ICD-10-CM | POA: Diagnosis not present

## 2019-04-10 DIAGNOSIS — C541 Malignant neoplasm of endometrium: Secondary | ICD-10-CM

## 2019-04-10 DIAGNOSIS — Z5111 Encounter for antineoplastic chemotherapy: Secondary | ICD-10-CM | POA: Diagnosis not present

## 2019-04-10 DIAGNOSIS — R5381 Other malaise: Secondary | ICD-10-CM | POA: Diagnosis not present

## 2019-04-10 MED ORDER — TRASTUZUMAB CHEMO 150 MG IV SOLR: 6.0000 mg/kg | Freq: Once | INTRAVENOUS | Status: DC

## 2019-04-10 MED ORDER — SODIUM CHLORIDE 0.9 % IV SOLN
Freq: Once | INTRAVENOUS | Status: AC
  Administered 2019-04-10: 09:00:00 via INTRAVENOUS
  Filled 2019-04-10: qty 250

## 2019-04-10 MED ORDER — ACETAMINOPHEN 325 MG PO TABS
ORAL_TABLET | ORAL | Status: AC
  Filled 2019-04-10: qty 2

## 2019-04-10 MED ORDER — HEPARIN SOD (PORK) LOCK FLUSH 100 UNIT/ML IV SOLN
500.0000 [IU] | Freq: Once | INTRAVENOUS | Status: AC | PRN
  Administered 2019-04-10: 16:00:00 500 [IU]
  Filled 2019-04-10: qty 5

## 2019-04-10 MED ORDER — SODIUM CHLORIDE 0.9% FLUSH
10.0000 mL | INTRAVENOUS | Status: DC | PRN
  Administered 2019-04-10: 16:00:00 10 mL
  Filled 2019-04-10: qty 10

## 2019-04-10 MED ORDER — DIPHENHYDRAMINE HCL 25 MG PO CAPS: 25.0000 mg | ORAL_CAPSULE | Freq: Once | ORAL | Status: DC

## 2019-04-10 MED ORDER — ACETAMINOPHEN 325 MG PO TABS
650.0000 mg | ORAL_TABLET | Freq: Once | ORAL | Status: AC
  Administered 2019-04-10: 09:00:00 650 mg via ORAL

## 2019-04-10 MED ORDER — SODIUM CHLORIDE 0.9 % IV SOLN
105.0000 mg/m2 | Freq: Once | INTRAVENOUS | Status: AC
  Administered 2019-04-10: 11:00:00 258 mg via INTRAVENOUS
  Filled 2019-04-10: qty 43

## 2019-04-10 MED ORDER — PALONOSETRON HCL INJECTION 0.25 MG/5ML
INTRAVENOUS | Status: AC
  Filled 2019-04-10: qty 5

## 2019-04-10 MED ORDER — TRASTUZUMAB CHEMO 150 MG IV SOLR
6.0000 mg/kg | Freq: Once | INTRAVENOUS | Status: AC
  Administered 2019-04-10: 15:00:00 504 mg via INTRAVENOUS
  Filled 2019-04-10: qty 24

## 2019-04-10 MED ORDER — SODIUM CHLORIDE 0.9 % IV SOLN
Freq: Once | INTRAVENOUS | Status: AC
  Administered 2019-04-10: 10:00:00 via INTRAVENOUS
  Filled 2019-04-10: qty 5

## 2019-04-10 MED ORDER — PALONOSETRON HCL INJECTION 0.25 MG/5ML
0.2500 mg | Freq: Once | INTRAVENOUS | Status: AC
  Administered 2019-04-10: 09:00:00 0.25 mg via INTRAVENOUS

## 2019-04-10 MED ORDER — SODIUM CHLORIDE 0.9 % IV SOLN
700.0000 mg | Freq: Once | INTRAVENOUS | Status: AC
  Administered 2019-04-10: 700 mg via INTRAVENOUS
  Filled 2019-04-10: qty 70

## 2019-04-10 MED ORDER — FAMOTIDINE IN NACL 20-0.9 MG/50ML-% IV SOLN
20.0000 mg | Freq: Once | INTRAVENOUS | Status: AC
  Administered 2019-04-10: 09:00:00 20 mg via INTRAVENOUS

## 2019-04-10 MED ORDER — FAMOTIDINE IN NACL 20-0.9 MG/50ML-% IV SOLN
INTRAVENOUS | Status: AC
  Filled 2019-04-10: qty 50

## 2019-04-10 NOTE — Progress Notes (Signed)
[place] OFFICE PROGRESS NOTE  Patient Care Team: Ronita Hipps, MD as PCP - General (Family Medicine) Nicholaus Bloom, MD (Anesthesiology)  ASSESSMENT & PLAN:  Endometrial cancer [facility]) She tolerated recent treatment well without major side effects Denies neuropathy She had minor fall at home due to accidental tripping of object without major complications I noted that she has mild progressive pancytopenia I plan to proceed with treatment as scheduled with minor dose adjustment of her treatment  Pancytopenia, acquired [facility]) She has mild progressive pancytopenia I plan to proceed with treatment as scheduled with minor dose adjustment She does not need transfusion support  Physical debility She has physical deconditioning and recent fall She will continue physical therapy   No orders of the defined types were placed in this encounter.   INTERVAL HISTORY: Please see below for problem oriented charting. She returns for further chemotherapy She had accidental fall at home recently and because facial bruising The patient denies any recent signs or symptoms of bleeding such as spontaneous epistaxis, hematuria or hematochezia. 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 A Pap was done 09/02/18 showign ASC-H. She was referred onto GYN and seen by Dr. Paula Compton    09/09/2018 Procedure   She underwent endometrial biopsy   09/12/2018 Pathology Results   Endometrial biopsy positive for adenocarcinoma   09/22/2018 Imaging   Ct abdomen and pelvis showed: Diffuse endometrial  thickening, consistent with primary endometrial carcinoma.  Pelvic and abdominal retroperitoneal and retrocrural lymphadenopathy, consistent with metastatic disease.  Moderate hepatic steatosis.   09/25/2018 Cancer Staging   Staging form: Corpus Uteri - Carcinoma and Carcinosarcoma, AJCC 8th Edition - Pathologic: Stage IIIC2 (pT2, pN2, cM0) - Signed by Heath Lark, MD on 02/23/2019   10/01/2018 Procedure   Successful placement of a right IJ approach Power Port with ultrasound and fluoroscopic guidance. The catheter is ready for use.   10/02/2018 Tumor Marker   Patient's tumor was tested for the following markers: CA-125 Results of the tumor marker test revealed 578   10/03/2018 - 01/16/2019 Chemotherapy   The patient had carboplatin and Taxol with dose adjustment due to neuropathy x 6 cycles   10/24/2018 Tumor Marker   Patient's tumor was tested for the following markers: CA-125 Results of the tumor marker test revealed 296    Genetic Testing   Patient has genetic testing done for MSI/MMR. Results revealed MSI stable and MMR normal on Accession [id number].   11/14/2018 Tumor Marker   Patient's tumor was tested for the following markers: CA-125 Results of the tumor marker test revealed 81   11/27/2018 Imaging   1. Significant decrease in abdominal retroperitoneal and bilateral iliac lymphadenopathy since previous study. 2. Significant decrease in abnormal endometrial soft tissue density since prior study. 3. No new or progressive metastatic disease identified.   12/05/2018 Tumor Marker   Patient's tumor was tested for the following markers: CA-125 Results of the tumor marker test revealed 46.3   12/26/2018 Tumor Marker   Patient's tumor was tested for the following markers: CA-125 Results of the tumor marker test revealed 38.2   01/16/2019 Tumor Marker  Patient's tumor was tested for the following markers: CA-125 Results of the tumor marker test revealed 30.8   02/03/2019  Imaging   1. Numerous retroperitoneal, iliac, and pelvic lymph nodes are stable or slightly decreased in size compared to immediate prior examination dated 11/27/2018 and significantly decreased in size compared to exam dated 09/20/2018.  2.  Unchanged thickening of the endometrium.  3. Other chronic, incidental, and postoperative findings as detailed above.   02/12/2019 Imaging   1. Uterus +/- tubes/ovaries, neoplastic, cervix, bilateral fallopian tubes and ovaries - MIXED HIGH-GRADE SEROUS AND ENDOMETRIAL ADENOCARCINOMA, 6.5 CM. SEE NOTE - CARCINOMA INVOLVES ENTIRE ENDOMETRIUM, ANTERIOR AND POSTERIOR LOWER UTERINE SEGMENTS AND ANTERIOR AND POSTERIOR CERVIX. - CARCINOMA INVADES FOR A DEPTH OF 1.9 CM WHERE MYOMETRIAL THICKNESS IS 2.0 CM (GREATER THAN 50% MYOMETRIAL INVASION) - CARCINOMA IS 1 MM FROM THE CERVICAL MARGIN - NEGATIVE FOR LYMPHOVASCULAR OR PERINEURAL INVASION - BILATERAL UNREMARKABLE FALLOPIAN TUBES AND OVARIES, NEGATIVE FOR CARCINOMA - SEE ONCOLOGY TABLE 2. Lymph node, biopsy, right pelvic - METASTATIC CARCINOMA TO ONE OF TWO LYMPH NODES (1/2) Microscopic Comment 1. (v4.1.0.0) UTERUS, CARCINOMA OR CARCINOSARCOMA Procedure: Total hysterectomy with bilateral salpingo-oophorectomy Histologic type: Mixed high-grade serous and endometrioid adenocarcinoma Histologic Grade: NA Myometrial invasion: Present Depth of invasion: 19 mm Myometrial thickness: 20 mm Uterine Serosa Involvement: Not identified Cervical stromal involvement: Present Extent of involvement of other organs: NA Lymphovascular invasion: Not identified Regional Lymph Nodes: Examined: 0 Sentinel 2 Non-sentinel 2 Total Lymph nodes with metastasis: 1 Isolated tumor cells (< 0.2 mm): 0 Micrometastasis: (> 0.2 mm and < 2.0 mm): 1 Macrometastasis: (> 2.0 mm): 0 Extracapsular extension: Not identified Tumor block for ancillary studies: 75F MMR / MSI testing: Pending Pathologic Stage Classification (pTNM, AJCC  8th edition): pT2, pN29m FIGO Stage: IIIC1 Representative Tumor Block: 1A, 1C (v4.1.0.0) Diagnosis Note 1. Immunohistochemical stains for p16, p53, ER and Ki-67 show a pattern of staining consistent with mixed high-grade serous and endometrial adenocarcinoma. Dr. KLyndon Codehas reviewed this case and concurs with the above interpretation. A molecular study for microsatellite instability (MSI) and immunostains for MMR-related proteins are pending and will be reported in an addendum.   02/12/2019 Surgery   Pre-operative Diagnosis: endometrial cancer stage IIIC2, s/p neoadjuvant chemotherapy, extreme obesity (BMI 53kg/m2)  Operation: Robotic-assisted laparoscopic total hysterectomy with bilateral salpingoophorectomy, right pelvic lymphadenectomy. 22 modifier for extreme obesity  Surgeon: RDonaciano Eva Assistant Surgeon: LLahoma CrockerMD  Operative Findings:  : extreme obesity, BMI 53kg/m2, with significant retroperitoneal and intraperitoneal adiposity. Obesity necessitated an additional hour of operating time to create safe exposure. Obesity required additional personnel in the operating room for positioning and retraction. Severe obesity substantially increased the complexity of the procedure. 8cm uterus, grossly normal, normal appearing cervix. Retroperitoneal fibrosis consistent with nodal positivity. Clinically suspicious right pelvic lymph node. Unable to completely visualize retroperitoneal nodes due to extreme obesity.    02/12/2019 Pathology Results   IMMUNOHISTOCHEMICAL AND MORPHOMETRIC ANALYSIS PERFORMED MANUALLY The tumor cells are POSITIVE for Her2 (3+). Estrogen Receptor: 70%, POSITIVE, STRONG STAINING INTENSITY Progesterone Receptor: 70%, POSITIVE, STRONG STAINING INTENSITY Proliferation Marker Ki67: 20% REFERENCE RANGE ESTROGEN RECEPTOR NEGATIVE 0% POSITIVE =>1% REFERENCE RANGE PROGESTERONE RECEPTOR NEGATIVE 0% POSITIVE =>1% All controls stained appropriately  1.  Uterus +/- tubes/ovaries, neoplastic, cervix, bilateral fallopian tubes and ovaries - MIXED HIGH-GRADE SEROUS AND ENDOMETRIAL ADENOCARCINOMA, 6.5 CM. SEE NOTE - CARCINOMA INVOLVES ENTIRE ENDOMETRIUM, ANTERIOR AND POSTERIOR LOWER UTERINE SEGMENTS AND ANTERIOR AND POSTERIOR CERVIX. - CARCINOMA INVADES FOR A DEPTH  OF 1.9 CM WHERE MYOMETRIAL THICKNESS IS 2.0 CM (GREATER THAN 50% MYOMETRIAL INVASION) - CARCINOMA IS 1 MM FROM THE CERVICAL MARGIN - NEGATIVE FOR LYMPHOVASCULAR OR PERINEURAL INVASION - BILATERAL UNREMARKABLE FALLOPIAN TUBES AND OVARIES, NEGATIVE FOR CARCINOMA - SEE ONCOLOGY TABLE 2. Lymph node, biopsy, right pelvic - METASTATIC CARCINOMA TO ONE OF TWO LYMPH NODES (1/2) Microscopic Comment 1. (v4.1.0.0) UTERUS, CARCINOMA OR CARCINOSARCOMA Procedure: Total hysterectomy with bilateral salpingo-oophorectomy Histologic type: Mixed high-grade serous and endometrioid adenocarcinoma Histologic Grade: NA Myometrial invasion: Present Depth of invasion: 19 mm Myometrial thickness: 20 mm Uterine Serosa Involvement: Not identified Cervical stromal involvement: Present Extent of involvement of other organs: NA Lymphovascular invasion: Not identified Regional Lymph Nodes: Examined: 0 Sentinel 2 Non-sentinel 2 Total Lymph nodes with metastasis: 1 Isolated tumor cells (< 0.2 mm): 0 Micrometastasis: (> 0.2 mm and < 2.0 mm): 1 Macrometastasis: (> 2.0 mm): 0 Extracapsular extension: Not identified Tumor block for ancillary studies: 35F MMR / MSI testing: Pending Pathologic Stage Classification (pTNM, AJCC 8th edition): pT2, pN42m FIGO Stage: IIIC1 Representative Tumor Block: 1A, 1C   03/17/2019 Echocardiogram   1. The left ventricle has normal systolic function, with an ejection fraction of 55-60%. The cavity size was normal. There is moderate concentric left ventricular hypertrophy. Left ventricular diastolic Doppler parameters are consistent with impaired relaxation.  2. The right ventricle  has normal systolic function. The cavity was normal. There is no increase in right ventricular wall thickness.  3. GLS: -15.5%, no prior study available for comparison.   03/17/2019 Tumor Marker   Patient's tumor was tested for the following markers: CA-125 Results of the tumor marker test revealed 43   03/20/2019 -  Chemotherapy   The patient had trastuzumab (HERCEPTIN) 693 mg in sodium chloride 0.9 % 250 mL chemo infusion, 8 mg/kg = 693 mg, Intravenous,  Once, 1 of 5 cycles Administration: 693 mg (03/20/2019)  for chemotherapy treatment.      REVIEW OF SYSTEMS:   Constitutional: Denies fevers, chills or abnormal weight loss Eyes: Denies blurriness of vision Ears, nose, mouth, throat, and face: Denies mucositis or sore throat Respiratory: Denies cough, dyspnea or wheezes Cardiovascular: Denies palpitation, chest discomfort or lower extremity swelling Gastrointestinal:  Denies nausea, heartburn or change in bowel habits Skin: Denies abnormal skin rashes Lymphatics: Denies new lymphadenopathy or easy bruising Neurological:Denies numbness, tingling or new weaknesses Behavioral/Psych: Mood is stable, no new changes  All other systems were reviewed with the patient and are negative.  I have reviewed the past medical history, past surgical history, social history and family history with the patient and they are unchanged from previous note.  ALLERGIES:  has No Known Allergies.  MEDICATIONS:  Current Outpatient Medications  Medication Sig Dispense Refill  . dexamethasone (DECADRON) 4 MG tablet Take 3 tabs at the night before and 3 tab the morning of chemotherapy, every 3 weeks, by mouth 20 tablet 0  . diphenhydrAMINE (BENADRYL) 25 mg capsule Take 100 mg by mouth 2 (two) times a day.    . gabapentin (NEURONTIN) 400 MG capsule Take 800 mg by mouth 3 (three) times daily.   1  . hydrochlorothiazide (MICROZIDE) 12.5 MG capsule Take 12.5 mg by mouth daily.  0  . lidocaine-prilocaine (EMLA) cream  Apply to affected area once (Patient taking differently: Apply 1 application topically daily as needed (prior to port being accessed.). Apply to affected area once) 30 g 3  . lidocaine-prilocaine (EMLA) cream Apply to affected area once 30 g 3  . losartan (  COZAAR) 50 MG tablet Take 50 mg by mouth daily.  2  . ondansetron (ZOFRAN) 8 MG tablet Take 8 mg by mouth every 8 (eight) hours as needed.    Marland Kitchen oxyCODONE-acetaminophen (PERCOCET) 10-325 MG tablet Take 1-2 tablets by mouth every 6 (six) hours as needed for pain.    Marland Kitchen prochlorperazine (COMPAZINE) 10 MG tablet TAKE 1 TABLET BY MOUTH EVERY 6 HOURS AS NEEDED FOR NAUSEA OR VOMITING 30 tablet 1   No current facility-administered medications for this visit.     PHYSICAL EXAMINATION: ECOG PERFORMANCE STATUS: 1 - Symptomatic but completely ambulatory  Vitals:   04/09/19 0904  BP: (!) 146/68  Pulse: 85  Resp: 18  Temp: 98.2 F (36.8 C)  SpO2: 97%   Filed Weights   04/09/19 0904  Weight: (!) 303 lb 3.2 oz (137.5 kg)    GENERAL:alert, no distress and comfortable SKIN: Noted facial bruising EYES: normal, Conjunctiva are pink and non-injected, sclera clear OROPHARYNX:no exudate, no erythema and lips, buccal mucosa, and tongue normal  NECK: supple, thyroid normal size, non-tender, without nodularity LYMPH:  no palpable lymphadenopathy in the cervical, axillary or inguinal LUNGS: clear to auscultation and percussion with normal breathing effort HEART: regular rate & rhythm and no murmurs and no lower extremity edema ABDOMEN:abdomen soft, non-tender and normal bowel sounds Musculoskeletal:no cyanosis of digits and no clubbing  NEURO: alert & oriented x 3 with fluent speech, no focal motor/sensory deficits  LABORATORY DATA:  I have reviewed the data as listed    Component Value Date/Time   NA 140 04/09/2019 0846   K 3.7 04/09/2019 0846   CL 104 04/09/2019 0846   CO2 25 04/09/2019 0846   GLUCOSE 146 (H) 04/09/2019 0846   BUN 13  04/09/2019 0846   CREATININE 0.80 04/09/2019 0846   CALCIUM 8.8 (L) 04/09/2019 0846   PROT 7.3 04/09/2019 0846   ALBUMIN 3.7 04/09/2019 0846   AST 16 04/09/2019 0846   ALT 20 04/09/2019 0846   ALKPHOS 103 04/09/2019 0846   BILITOT 0.3 04/09/2019 0846   GFRNONAA >60 04/09/2019 0846   GFRAA >60 04/09/2019 0846    No results found for: SPEP, UPEP  Lab Results  Component Value Date   WBC 4.0 04/09/2019   NEUTROABS 1.8 04/09/2019   HGB 10.8 (L) 04/09/2019   HCT 33.7 (L) 04/09/2019   MCV 107.3 (H) 04/09/2019   PLT 99 (L) 04/09/2019      Chemistry      Component Value Date/Time   NA 140 04/09/2019 0846   K 3.7 04/09/2019 0846   CL 104 04/09/2019 0846   CO2 25 04/09/2019 0846   BUN 13 04/09/2019 0846   CREATININE 0.80 04/09/2019 0846      Component Value Date/Time   CALCIUM 8.8 (L) 04/09/2019 0846   ALKPHOS 103 04/09/2019 0846   AST 16 04/09/2019 0846   ALT 20 04/09/2019 0846   BILITOT 0.3 04/09/2019 0846      All questions were answered. The patient knows to call the clinic with any problems, questions or concerns. No barriers to learning was detected.  I spent 20 minutes counseling the patient face to face. The total time spent in the appointment was 25 minutes and more than 50% was on counseling and review of test results  Heath Lark, MD 04/10/2019 6:47 AM

## 2019-04-10 NOTE — Assessment & Plan Note (Signed)
She has mild progressive pancytopenia I plan to proceed with treatment as scheduled with minor dose adjustment She does not need transfusion support

## 2019-04-10 NOTE — Progress Notes (Signed)
Per Carolynne Edouard, RPH, because the is a delay in mixing the herceptin, it is ok to give taxol and [place] first and herceptin last.

## 2019-04-10 NOTE — Patient Instructions (Signed)
[place] Discharge Instructions for Patients Receiving Chemotherapy  Today you received the following chemotherapy agents Taxol and Carboplatin and Herceptin  To help prevent nausea and vomiting after your treatment, we encourage you to take your nausea medication as directed.  If you develop nausea and vomiting that is not controlled by your nausea medication, call the clinic.   BELOW ARE SYMPTOMS THAT SHOULD BE REPORTED IMMEDIATELY:  *FEVER GREATER THAN 100.5 F  *CHILLS WITH OR WITHOUT FEVER  NAUSEA AND VOMITING THAT IS NOT CONTROLLED WITH YOUR NAUSEA MEDICATION  *UNUSUAL SHORTNESS OF BREATH  *UNUSUAL BRUISING OR BLEEDING  TENDERNESS IN MOUTH AND THROAT WITH OR WITHOUT PRESENCE OF ULCERS  *URINARY PROBLEMS  *BOWEL PROBLEMS  UNUSUAL RASH Items with * indicate a potential emergency and should be followed up as soon as possible.  Feel free to call the clinic should you have any questions or concerns. The clinic phone number is [phone].  Please show the [place] at check-in to the Emergency Department and triage nurse.

## 2019-04-10 NOTE — Assessment & Plan Note (Signed)
She tolerated recent treatment well without major side effects Denies neuropathy She had minor fall at home due to accidental tripping of object without major complications I noted that she has mild progressive pancytopenia I plan to proceed with treatment as scheduled with minor dose adjustment of her treatment

## 2019-04-10 NOTE — Assessment & Plan Note (Signed)
She has physical deconditioning and recent fall She will continue physical therapy

## 2019-04-10 NOTE — Progress Notes (Signed)
Herceptin dose changed based on adjusted body weight per Dr Jacklynn Lewis.  All future herceptin doses changed.

## 2019-04-21 ENCOUNTER — Ambulatory Visit: Payer: Medicare Other | Attending: Hematology and Oncology

## 2019-04-21 ENCOUNTER — Other Ambulatory Visit: Payer: Self-pay

## 2019-04-21 DIAGNOSIS — M5442 Lumbago with sciatica, left side: Secondary | ICD-10-CM | POA: Diagnosis not present

## 2019-04-21 DIAGNOSIS — G8929 Other chronic pain: Secondary | ICD-10-CM | POA: Diagnosis not present

## 2019-04-21 DIAGNOSIS — M5441 Lumbago with sciatica, right side: Secondary | ICD-10-CM | POA: Diagnosis not present

## 2019-04-21 DIAGNOSIS — M6281 Muscle weakness (generalized): Secondary | ICD-10-CM | POA: Diagnosis not present

## 2019-04-21 DIAGNOSIS — R262 Difficulty in walking, not elsewhere classified: Secondary | ICD-10-CM | POA: Diagnosis not present

## 2019-04-21 NOTE — Patient Instructions (Signed)
Heel Raise: Bilateral (Standing)   Cancer Rehab [phone]    Stand near counter for fingertip support if needed. Rise on balls of feet. Repeat __10-20__ times per set. Do _1-2___ sets per session. Do __2__ sessions per day.  SINGLE LIMB STANCE    Stand at counter (corner if you have one) for minimal arm support. Raise leg. Hold _10-20__ seconds. Repeat with other leg. Once this becomes easier, for increased challenge, close eyes with fingertips on counter for safety. _3-5__ reps per set, _2-3__ sets per day.    Tandem Stance    Stand at counter (corner if you have one). Right foot in front of left, heel touching toe both feet "straight ahead". Stand on Foot Triangle of Support with both feet. Balance in this position _10-20__ seconds. Do with left foot in front of right. Once this becomes easier, for increased challenge, close eyes with fingertips on counter for safety.

## 2019-04-21 NOTE — Therapy (Signed)
[place], [place], [address] Phone: [phone]   Fax:  [phone]  Physical Therapy Treatment  Patient Details  Name: Kathryn [name] MRN: [id number] Date of Birth: December 05, 1950 Referring Provider (PT): Dr. Alvy Bimler    Encounter Date: 04/21/2019  PT End of Session - 04/21/19 1156    Visit Number  9    Number of Visits  16    Date for PT Re-Evaluation  05/14/19    PT Start Time  1102    PT Stop Time  1147    PT Time Calculation (min)  45 min    Activity Tolerance  Patient tolerated treatment well    Behavior During Therapy  [facility] for tasks assessed/performed       Past Medical History:  Diagnosis Date  . Arthritis    Knees and ankles fussed rt ankle  . Dyspnea    with excertion  . Hypertension   . Neuromuscular disorder ([place]) 1992   pinched nerve after back surgery numbness Lt leg lateral and last tow toes.    Past Surgical History:  Procedure Laterality Date  . ANKLE SURGERY Right    right fusion  . BACK SURGERY     5 surgeries at [facility]; has screws and a "cage"  . CARPAL TUNNEL RELEASE Bilateral   . COLONOSCOPY    . IR IMAGING GUIDED PORT INSERTION  10/01/2018  . KNEE SURGERY Left   . LAPAROSCOPIC CHOLECYSTECTOMY    . LUMBAR LAMINECTOMY/DECOMPRESSION MICRODISCECTOMY Left 08/06/2013   Procedure: LUMBAR LAMINECTOMY/DECOMPRESSION MICRODISCECTOMY 1 LEVEL;  Surgeon: Ophelia Charter, MD;  Location: [facility];  Service: Neurosurgery;  Laterality: Left;  redo LEFT Lumbar Five-Sacral One diskectomy  . LYMPH NODE BIOPSY N/A 02/12/2019   Procedure: RIGHT PELVIC LYMPH NODE DISSECTION;  Surgeon: Everitt Amber, MD;  Location: [facility];  Service: Gynecology;  Laterality: N/A;  . PLACEMENT OF LUMBAR DRAIN N/A 08/07/2013   Procedure: PLACEMENT OF LUMBAR DRAIN;  Surgeon: Ophelia Charter, MD;  Location: [facility];  Service: Neurosurgery;  Laterality: N/A;  . ROBOTIC ASSISTED TOTAL HYSTERECTOMY WITH BILATERAL  SALPINGO OOPHERECTOMY Bilateral 02/12/2019   Procedure: XI ROBOTIC ASSISTED TOTAL HYSTERECTOMY WITH BILATERAL SALPINGO OOPHORECTOMY;  Surgeon: Everitt Amber, MD;  Location: [facility];  Service: Gynecology;  Laterality: Bilateral;  . TONSILLECTOMY    . TUBAL LIGATION  1982    There were no vitals filed for this visit.  Subjective Assessment - 04/21/19 1104    Subjective  I fell since I was here last. I was walking out onto my back porch and my slipper caught the track of the sliding glass door. The corner of my glasses caught my eye and it was black for awhile. Didnt go to the doctor but I had chemo that Friday (this happened 03/30/19) but I told the doctor about it at chemo that Friday bc they saw my eye. My knees took most of the fall. I had alot of trouble trying to get up bc I couldn't kneel now since they were hurting so bad. I had the hardest time getting up with my sons help even, bc I just couldn't get my Rt foot under me. So I want to learn how to strengthen or whatever I need to do to be able to get off the floor if I fall again. Now my back is hurting since then when I get up from sitting and I feel like I want to fall forward for my first few steps.  Pertinent History  Endometrial cancer diagnosed September 01, 2018  First  chemotherapy Dec. 20  and will have 3 treatments , one every 3 weeks, then retesting and possibly surgery and then 3 more chemos. but that schedule may change  past history includes 6 back surgeries from L1 down starting in 1974 due to degenerative disc disease from the neck down She had an MRI in May of 2018 and was told there were so many areas of compression that it was not able to tell where leg pain was coming from.  She is followed by Dr. Koleen Nimrod at [facility] and she has epidurals but is not able have them anymore because there is no space for  the needle     Patient Stated Goals  to get back in the water again and go to the grocery store and not be out of breath     Currently in Pain?  No/denies   only LBP with sit-stand 6/10                      [facility] Adult PT Treatment/Exercise - 04/21/19 0001      Self-Care   Other Self-Care Comments   Spent session with pt filling therapist in on fall she had 3 weeks ago and sharing her concerns that she was unable to get her Rt foot under her to stand up due to weakness. Then reviewed her current HEP adding 3 balance exs that she can do at the counter and how she can perform current HEP within pain tolerance.              PT Education - 04/21/19 1154    Education Details  Verbally instructed pt in adding standing balance activities at counter of SLS practice, stationary heel raises, and tandem (or as much as able) stance. Also pt having pain in knees with squats in front of chair so instructed her to do this in smaller ROM where she doesn't have pain and add prolonged holds (10 sec) so she is still able to do some form of the exercise and get benefit from this.    Person(s) Educated  Patient    Methods  Explanation;Demonstration;Handout    Comprehension  Verbalized understanding;Need further instruction       PT Short Term Goals - 10/16/18 1549      PT SHORT TERM GOAL #1   Title  Pt will report she is able to do a home exercise program and document her progress at home     Time  4    Period  Weeks    Status  New        PT Long Term Goals - 03/19/19 1149      PT LONG TERM GOAL #1   Title  Pt will decrease her time for Normal TUG to < 10 seconds indicating an improvment in balance and gait    Baseline  12.76; 12.25-2/19/20; did not retest today but pt reports sit-stand easier now as her legs are feeling stronger-12/17/18, 03/19/19- 16.1 seconds      PT LONG TERM GOAL #2   Title  Pt with increase # of reps of sit to stand in 30 seconds to < 13 indicating and increase in general strength    Baseline  11; 11 - 12/03/18; did not retest otday but pt is demonstrating greater ease with  sit-stand multiple reps during session-12/17/18, 03/19/19- 9 reps pt very short of breath after this  Time  8    Period  Weeks    Status  On-going      PT LONG TERM GOAL #3   Title  Pt will report she is able to climb steps with 25% less difficulty so that she can get in and out of the pool for water exercise     Baseline  Pt still struggling with stairs so hasn't been back to pool-12/03/18; pt reports though she hasn't been back to pool yet her arms and legs are feeling stronger with all ADLs-3/4/2, 03/19/19- pt reports she is still having a lot of difficulty and has not really used the steps at all    Time  8    Period  Weeks    Status  On-going            Plan - 04/21/19 1156    Clinical Impression Statement  Spent session with pt informing therapist of fall 3 weeks ago stepping out onto her back porch and her slipper got caught in the track. She Dr.Gorsuch a few days later at chemo who was informed of her fall. Since then pt has been having lightheadedness each time she goes from sit to stand and back pain up to 6/10 also each time. Pt is still ahving some knee pain as well with certain motions (when sitting down). Reports can get back pain to go away if she goes and lays down for awhile. Instructed pt that with her history and since she did hit her head when she fell since she is still having all these symptoms 3 weeks later it would be a good idea to update her PCP to see if furhter follow up is needed (and [facility], PT was present for some of session and agreed). Also reviewed current HEP and issued gentle standing balance activites that pt can safely try at counter at home and mostly to work within her pain tolerance for now. Pt agreeable to all and reports will call PCP before next visit next week and work on HEP as able as well.    Rehab Potential  Good    Clinical Impairments Affecting Rehab Potential  limited back mobility and diffuse degernative disc disease , pt will not  be able to come the week of chemo     PT Frequency  1x / week   pt will not come the week after chemo   PT Duration  8 weeks    PT Treatment/Interventions  ADLs/Self Care Home Management;DME Instruction;Gait training;Stair training;Functional mobility training;Cognitive remediation;Neuromuscular re-education;Therapeutic exercise;Therapeutic activities;Patient/family education;Energy conservation    PT Next Visit Plan  See what doctor said about fall/did she need to be seen? Then as pt is able cont with balance and endurance activities, including functional UE and LE strength being mindful of new back and knee pain since fall; work on progressing HEP once current one is no longer challenging, maybe instruct pt in circuit training type exercises.    Consulted and Agree with Plan of Care  Patient       Patient will benefit from skilled therapeutic intervention in order to improve the following deficits and impairments:  Abnormal gait, Decreased knowledge of use of DME, Pain, Postural dysfunction, Increased muscle spasms, Decreased scar mobility, Decreased mobility, Decreased activity tolerance, Decreased endurance, Decreased strength, Impaired perceived functional ability, Obesity, Difficulty walking, Decreased balance, Decreased knowledge of precautions  Visit Diagnosis: 1. Difficulty in walking   2. Muscle weakness (generalized)   3. Chronic bilateral low back  pain with bilateral sciatica        Problem List Patient Active Problem List   Diagnosis Date Noted  . Pancytopenia, acquired ([place]) 04/10/2019  . Hypokalemia due to loss of potassium 01/16/2019  . Steroid-induced hyperglycemia 11/14/2018  . Chemotherapy-induced nausea 10/24/2018  . Other constipation 10/24/2018  . Insomnia due to medical condition 10/07/2018  . Chronic back pain greater than 3 months duration 09/26/2018  . Neuropathy due to medical condition ([place]) 09/26/2018  . Physical debility 09/26/2018  . Morbid (severe)  obesity due to excess calories ([place]) 09/26/2018  . Endometrial cancer ([place]) 09/24/2018  . Adenopathy 09/24/2018    Otelia Limes, PTA 04/21/2019, 12:03 PM  [place], [place], [address] Phone: [phone]   Fax:  [phone]  Name: Kathryn [name] MRN: [id number] Date of Birth: Feb 29, 1951

## 2019-04-28 ENCOUNTER — Other Ambulatory Visit: Payer: Self-pay

## 2019-04-28 ENCOUNTER — Ambulatory Visit: Payer: Medicare Other

## 2019-04-28 DIAGNOSIS — M5442 Lumbago with sciatica, left side: Secondary | ICD-10-CM | POA: Diagnosis not present

## 2019-04-28 DIAGNOSIS — M6281 Muscle weakness (generalized): Secondary | ICD-10-CM

## 2019-04-28 DIAGNOSIS — R262 Difficulty in walking, not elsewhere classified: Secondary | ICD-10-CM

## 2019-04-28 DIAGNOSIS — G8929 Other chronic pain: Secondary | ICD-10-CM | POA: Diagnosis not present

## 2019-04-28 DIAGNOSIS — M5441 Lumbago with sciatica, right side: Secondary | ICD-10-CM | POA: Diagnosis not present

## 2019-04-28 NOTE — Therapy (Addendum)
Progress Note Reporting Period 10/16/18 to 04/28/19  See note below for Objective Data and Assessment of Progress/Goals.      [place], [place], [address] Phone: [phone]   Fax:  [phone]  Physical Therapy Treatment  Patient Details  Name: Kathryn [name] MRN: [id number] Date of Birth: 1950/12/11 Referring Provider (PT): Dr. Alvy Bimler    Encounter Date: 04/28/2019  PT End of Session - 04/28/19 1049    Visit Number  10    Number of Visits  16    Date for PT Re-Evaluation  05/14/19    PT Start Time  1004    PT Stop Time  1046    PT Time Calculation (min)  42 min    Activity Tolerance  Patient tolerated treatment well    Behavior During Therapy  [facility] for tasks assessed/performed       Past Medical History:  Diagnosis Date  . Arthritis    Knees and ankles fussed rt ankle  . Dyspnea    with excertion  . Hypertension   . Neuromuscular disorder ([place]) 1992   pinched nerve after back surgery numbness Lt leg lateral and last tow toes.    Past Surgical History:  Procedure Laterality Date  . ANKLE SURGERY Right    right fusion  . BACK SURGERY     5 surgeries at [facility]; has screws and a "cage"  . CARPAL TUNNEL RELEASE Bilateral   . COLONOSCOPY    . IR IMAGING GUIDED PORT INSERTION  10/01/2018  . KNEE SURGERY Left   . LAPAROSCOPIC CHOLECYSTECTOMY    . LUMBAR LAMINECTOMY/DECOMPRESSION MICRODISCECTOMY Left 08/06/2013   Procedure: LUMBAR LAMINECTOMY/DECOMPRESSION MICRODISCECTOMY 1 LEVEL;  Surgeon: Ophelia Charter, MD;  Location: [facility];  Service: Neurosurgery;  Laterality: Left;  redo LEFT Lumbar Five-Sacral One diskectomy  . LYMPH NODE BIOPSY N/A 02/12/2019   Procedure: RIGHT PELVIC LYMPH NODE DISSECTION;  Surgeon: Everitt Amber, MD;  [facility];  Service: Gynecology;  Laterality: N/A;  . PLACEMENT OF LUMBAR DRAIN N/A 08/07/2013   Procedure: PLACEMENT OF LUMBAR DRAIN;  Surgeon:  Ophelia Charter, MD;  Location: [facility];  Service: Neurosurgery;  Laterality: N/A;  . ROBOTIC ASSISTED TOTAL HYSTERECTOMY WITH BILATERAL SALPINGO OOPHERECTOMY Bilateral 02/12/2019   Procedure: XI ROBOTIC ASSISTED TOTAL HYSTERECTOMY WITH BILATERAL SALPINGO OOPHORECTOMY;  Surgeon: Everitt Amber, MD;  [facility];  Service: Gynecology;  Laterality: Bilateral;  . TONSILLECTOMY    . TUBAL LIGATION  1982    There were no vitals filed for this visit.  Subjective Assessment - 04/28/19 1009    Subjective  I called my doctors office after our last session and they changed my BP meds, lowering it to see if this would help my dizzy spells. They also said my back and nerve pain would probably take up to 4-6 weeks to calm down from the fall and they didn't think I needed an xray for right now. I can tell it's some improved since I fell. I need to be able to pick my legs up better to get in and out of the car.    Pertinent History  Endometrial cancer diagnosed September 01, 2018  First  chemotherapy Dec. 20  and will have 3 treatments , one every 3 weeks, then retesting and possibly surgery and then 3 more chemos. but that schedule may change  past history includes 6 back surgeries from L1 down starting in 1974 due to degenerative disc disease from  the neck down She had an MRI in May of 2018 and was told there were so many areas of compression that it was not able to tell where leg pain was coming from.  She is followed by Dr. Koleen Nimrod at [facility] and she has epidurals but is not able have them anymore because there is no space for  the needle     Patient Stated Goals  to get back in the water again and go to the grocery store and not be out of breath    Currently in Pain?  No/denies    Pain Score  4     Pain Location  Leg    Pain Orientation  Left    Pain Descriptors / Indicators  Aching;Shooting   shooting is intermittent   Pain Type  Neuropathic pain    Pain Onset  More than a month ago    Pain  Frequency  Constant    Aggravating Factors   sitting or standing for long periods    Pain Relieving Factors  medication                       [facility] Adult PT Treatment/Exercise - 04/28/19 0001      Knee/Hip Exercises: Aerobic   Nustep  Level 4 x 10 mins with a good pace      Knee/Hip Exercises: Standing   Heel Raises  Both;10 reps   and toe raises, +2 HHA at counter   Hip Flexion  Stengthening;Right;Left;10 reps;Knee bent   2 lbs on each ankle, +2 HHA on counter   Hip Abduction  Stengthening;Right;Left;10 reps;Knee straight   2 lbs on each ankle, +2 HHA on counter   Abduction Limitations  VCs and  demo to decrease hip er    Hip Extension  Stengthening;Right;Left;10 reps;Knee straight   2 lbs on each ankle, +2 HHA at counter   Extension Limitations  VCs to decrease forward trunk lean      Knee/Hip Exercises: Seated   Long Arc Quad  Strengthening;Right;Left;10 reps   Rt 15 reps with 3-5 sec holds for both   [employer] Weight  2 lbs.      Shoulder Exercises: Seated   Flexion  Strengthening;Right;Left;10 reps;Weights    Flexion Weight (lbs)  1    Other Seated Exercises  Bil OH press with 1 #, and bil bicep curls 2# , 2 x 10 each done in circuit along with flexion      Shoulder Exercises: Standing   Other Standing Exercises  Wall pushups 2 x 10 done in circuit with seated shoulder exercises               PT Short Term Goals - 10/16/18 1549      PT SHORT TERM GOAL #1   Title  Pt will report she is able to do a home exercise program and document her progress at home     Time  4    Period  Weeks    Status  New        PT Long Term Goals - 03/19/19 1149      PT LONG TERM GOAL #1   Title  Pt will decrease her time for Normal TUG to < 10 seconds indicating an improvment in balance and gait    Baseline  12.76; 12.25-2/19/20; did not retest today but pt reports sit-stand easier now as her legs are feeling stronger-12/17/18, 03/19/19- 16.1 seconds  PT  LONG TERM GOAL #2   Title  Pt with increase # of reps of sit to stand in 30 seconds to < 13 indicating and increase in general strength    Baseline  11; 11 - 12/03/18; did not retest otday but pt is demonstrating greater ease with sit-stand multiple reps during session-12/17/18, 03/19/19- 9 reps pt very short of breath after this    Time  8    Period  Weeks    Status  On-going      PT LONG TERM GOAL #3   Title  Pt will report she is able to climb steps with 25% less difficulty so that she can get in and out of the pool for water exercise     Baseline  Pt still struggling with stairs so hasn't been back to pool-12/03/18; pt reports though she hasn't been back to pool yet her arms and legs are feeling stronger with all ADLs-3/4/2, 03/19/19- pt reports she is still having a lot of difficulty and has not really used the steps at all    Time  8    Period  Weeks    Status  On-going            Plan - 04/28/19 1051    Clinical Impression Statement  Todays was first session really getting back into exercising and she tolerated this very well. Was able to do 10 mins on NuStep and a combination of seated and standing LE and UE exercises. She reported feeling good after session.    Rehab Potential  Good    Clinical Impairments Affecting Rehab Potential  limited back mobility and diffuse degernative disc disease , pt will not be able to come the week of chemo     PT Frequency  1x / week   pt will not come each week after chemo   PT Duration  8 weeks    PT Treatment/Interventions  ADLs/Self Care Home Management;DME Instruction;Gait training;Stair training;Functional mobility training;Cognitive remediation;Neuromuscular re-education;Therapeutic exercise;Therapeutic activities;Patient/family education;Energy conservation    PT Next Visit Plan  Cont with balance and endurance activities, including functional UE and LE strength being mindful of new back and knee pain since fall; work on progressing HEP once  current one is no longer challenging, maybe instruct pt in circuit training type exercises.    Consulted and Agree with Plan of Care  Patient       Patient will benefit from skilled therapeutic intervention in order to improve the following deficits and impairments:  Abnormal gait, Decreased knowledge of use of DME, Pain, Postural dysfunction, Increased muscle spasms, Decreased scar mobility, Decreased mobility, Decreased activity tolerance, Decreased endurance, Decreased strength, Impaired perceived functional ability, Obesity, Difficulty walking, Decreased balance, Decreased knowledge of precautions  Visit Diagnosis: 1. Difficulty in walking   2. Muscle weakness (generalized)   3. Chronic bilateral low back pain with bilateral sciatica        Problem List Patient Active Problem List   Diagnosis Date Noted  . Pancytopenia, acquired ([place]) 04/10/2019  . Hypokalemia due to loss of potassium 01/16/2019  . Steroid-induced hyperglycemia 11/14/2018  . Chemotherapy-induced nausea 10/24/2018  . Other constipation 10/24/2018  . Insomnia due to medical condition 10/07/2018  . Chronic back pain greater than 3 months duration 09/26/2018  . Neuropathy due to medical condition ([place]) 09/26/2018  . Physical debility 09/26/2018  . Morbid (severe) obesity due to excess calories ([place]) 09/26/2018  . Endometrial cancer ([place]) 09/24/2018  . Adenopathy 09/24/2018  Otelia Limes, PTA 04/28/2019, 10:55 AM  [place], [place], [address] Phone: [phone]   Fax:  [phone]  Name: Kathryn [name] MRN: [id number] Date of Birth: 06-11-1951

## 2019-04-30 ENCOUNTER — Other Ambulatory Visit: Payer: Self-pay

## 2019-04-30 ENCOUNTER — Inpatient Hospital Stay: Payer: Medicare Other | Attending: Hematology and Oncology

## 2019-04-30 ENCOUNTER — Telehealth: Payer: Self-pay | Admitting: Hematology and Oncology

## 2019-04-30 ENCOUNTER — Inpatient Hospital Stay: Payer: Medicare Other | Admitting: Hematology and Oncology

## 2019-04-30 ENCOUNTER — Encounter: Payer: Self-pay | Admitting: Hematology and Oncology

## 2019-04-30 VITALS — BP 144/93 | HR 70 | Temp 97.3°F | Resp 18 | Ht 63.0 in | Wt 297.2 lb

## 2019-04-30 DIAGNOSIS — Z9221 Personal history of antineoplastic chemotherapy: Secondary | ICD-10-CM | POA: Insufficient documentation

## 2019-04-30 DIAGNOSIS — G629 Polyneuropathy, unspecified: Secondary | ICD-10-CM

## 2019-04-30 DIAGNOSIS — E669 Obesity, unspecified: Secondary | ICD-10-CM

## 2019-04-30 DIAGNOSIS — Z5112 Encounter for antineoplastic immunotherapy: Secondary | ICD-10-CM | POA: Diagnosis not present

## 2019-04-30 DIAGNOSIS — Z5111 Encounter for antineoplastic chemotherapy: Secondary | ICD-10-CM | POA: Insufficient documentation

## 2019-04-30 DIAGNOSIS — Z79899 Other long term (current) drug therapy: Secondary | ICD-10-CM | POA: Insufficient documentation

## 2019-04-30 DIAGNOSIS — C541 Malignant neoplasm of endometrium: Secondary | ICD-10-CM | POA: Diagnosis not present

## 2019-04-30 DIAGNOSIS — D61818 Other pancytopenia: Secondary | ICD-10-CM | POA: Diagnosis not present

## 2019-04-30 DIAGNOSIS — G63 Polyneuropathy in diseases classified elsewhere: Secondary | ICD-10-CM

## 2019-04-30 LAB — CBC WITH DIFFERENTIAL (CANCER CENTER ONLY)
Abs Immature Granulocytes: 0.02 10*3/uL (ref 0.00–0.07)
Basophils Absolute: 0 10*3/uL (ref 0.0–0.1)
Basophils Relative: 0 %
Eosinophils Absolute: 0 10*3/uL (ref 0.0–0.5)
Eosinophils Relative: 0 %
HCT: 32.5 % — ABNORMAL LOW (ref 36.0–46.0)
Hemoglobin: 10.4 g/dL — ABNORMAL LOW (ref 12.0–15.0)
Immature Granulocytes: 1 %
Lymphocytes Relative: 34 %
Lymphs Abs: 1.3 10*3/uL (ref 0.7–4.0)
MCH: 33.8 pg (ref 26.0–34.0)
MCHC: 32 g/dL (ref 30.0–36.0)
MCV: 105.5 fL — ABNORMAL HIGH (ref 80.0–100.0)
Monocytes Absolute: 0.4 10*3/uL (ref 0.1–1.0)
Monocytes Relative: 11 %
Neutro Abs: 2.1 10*3/uL (ref 1.7–7.7)
Neutrophils Relative %: 54 %
Platelet Count: 121 10*3/uL — ABNORMAL LOW (ref 150–400)
RBC: 3.08 MIL/uL — ABNORMAL LOW (ref 3.87–5.11)
RDW: 15.8 % — ABNORMAL HIGH (ref 11.5–15.5)
WBC Count: 3.9 10*3/uL — ABNORMAL LOW (ref 4.0–10.5)
nRBC: 0.5 % — ABNORMAL HIGH (ref 0.0–0.2)

## 2019-04-30 LAB — CMP (CANCER CENTER ONLY)
ALT: 16 U/L (ref 0–44)
AST: 18 U/L (ref 15–41)
Albumin: 3.6 g/dL (ref 3.5–5.0)
Alkaline Phosphatase: 98 U/L (ref 38–126)
Anion gap: 11 (ref 5–15)
BUN: 16 mg/dL (ref 8–23)
CO2: 26 mmol/L (ref 22–32)
Calcium: 8.8 mg/dL — ABNORMAL LOW (ref 8.9–10.3)
Chloride: 104 mmol/L (ref 98–111)
Creatinine: 0.73 mg/dL (ref 0.44–1.00)
GFR, Est AFR Am: >60 mL/min (ref >60)
GFR, Estimated: >60 mL/min (ref >60)
Glucose, Bld: 131 mg/dL — ABNORMAL HIGH (ref 70–99)
Potassium: 3.6 mmol/L (ref 3.5–5.1)
Sodium: 141 mmol/L (ref 135–145)
Total Bilirubin: 0.2 mg/dL — ABNORMAL LOW (ref 0.3–1.2)
Total Protein: 7.2 g/dL (ref 6.5–8.1)

## 2019-04-30 MED ORDER — GABAPENTIN 400 MG PO CAPS: 1200.0000 mg | ORAL_CAPSULE | Freq: Three times a day (TID) | ORAL | 1 refills | Status: DC

## 2019-04-30 NOTE — Assessment & Plan Note (Signed)
She is getting worsening neuropathy I plan to increase the dose of gabapentin I warned her about risk of sedation and constipation I plan to reduce the dose of Taxol as above

## 2019-04-30 NOTE — Telephone Encounter (Signed)
I talk with patient regarding schedule  

## 2019-04-30 NOTE — Progress Notes (Signed)
[facility] OFFICE PROGRESS NOTE  Patient Care Team: Ronita Hipps, MD as PCP - General (Family Medicine) Nicholaus Bloom, MD (Anesthesiology)  ASSESSMENT & PLAN:  Endometrial cancer [facility]) She is developing grade 2 neuropathy Her calculated chemotherapy dose still come back fairly high I plan minor further reduction of Taxol She will complete carboplatin and Taxol tomorrow but we will continue Herceptin every 3 weeks I plan to repeat CT imaging in August and if that shows no evidence of disease progression, she will go on maintenance Herceptin only She will be due for echocardiogram in September.  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 CT ABDOMEN PELVIS W CONTRAST    Standing Status:   Future    Standing Expiration Date:   04/29/2020    Order Specific Question:   If indicated for the ordered procedure, I authorize the administration of contrast media per Radiology protocol    Answer:   Yes    Order Specific Question:   Preferred imaging location?    Answer:   [facility]    Order Specific Question:   Radiology Contrast Protocol - do NOT remove file path    Answer:   [email or web address]    INTERVAL HISTORY: Please see below for problem oriented charting. 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 A Pap was done 09/02/18 showign ASC-H. She was referred onto GYN and seen by Dr. Paula Compton    09/09/2018 Procedure   She underwent endometrial biopsy   09/12/2018 Pathology Results   Endometrial biopsy positive for adenocarcinoma   09/22/2018 Imaging   Ct abdomen and pelvis showed: Diffuse endometrial thickening, consistent with primary endometrial carcinoma.  Pelvic and abdominal retroperitoneal and retrocrural lymphadenopathy, consistent with metastatic disease.  Moderate hepatic steatosis.   09/25/2018 Cancer Staging   Staging form: Corpus Uteri - Carcinoma and Carcinosarcoma, AJCC 8th Edition - Pathologic: Stage IIIC2 (pT2, pN2, cM0) - Signed by Heath Lark, MD on 02/23/2019   10/01/2018 Procedure   Successful placement of a right IJ approach Power Port with ultrasound and fluoroscopic guidance. The catheter is ready for use.   10/02/2018 Tumor Marker   Patient's tumor was tested for the following markers: CA-125 Results of the tumor marker test revealed 578   10/03/2018 - 01/16/2019 Chemotherapy   The patient had carboplatin and Taxol with dose adjustment due to neuropathy x 6 cycles   10/24/2018 Tumor Marker   Patient's tumor was tested for the following markers: CA-125 Results of the tumor marker test revealed 296    Genetic Testing   Patient has genetic testing done for MSI/MMR.  Results revealed MSI stable and MMR normal on Accession [id number].   11/14/2018 Tumor Marker   Patient's tumor was tested for the following markers: CA-125 Results of the tumor marker test revealed 81   11/27/2018 Imaging   1. Significant decrease in abdominal retroperitoneal and bilateral  iliac lymphadenopathy since previous study. 2. Significant decrease in abnormal endometrial soft tissue density since prior study. 3. No new or progressive metastatic disease identified.   12/05/2018 Tumor Marker   Patient's tumor was tested for the following markers: CA-125 Results of the tumor marker test revealed 46.3   12/26/2018 Tumor Marker   Patient's tumor was tested for the following markers: CA-125 Results of the tumor marker test revealed 38.2   01/16/2019 Tumor Marker   Patient's tumor was tested for the following markers: CA-125 Results of the tumor marker test revealed 30.8   02/03/2019 Imaging   1. Numerous retroperitoneal, iliac, and pelvic lymph nodes are stable or slightly decreased in size compared to immediate prior examination dated 11/27/2018 and significantly decreased in size compared to exam dated 09/20/2018.  2.  Unchanged thickening of the endometrium.  3. Other chronic, incidental, and postoperative findings as detailed above.   02/12/2019 Imaging   1. Uterus +/- tubes/ovaries, neoplastic, cervix, bilateral fallopian tubes and ovaries - MIXED HIGH-GRADE SEROUS AND ENDOMETRIAL ADENOCARCINOMA, 6.5 CM. SEE NOTE - CARCINOMA INVOLVES ENTIRE ENDOMETRIUM, ANTERIOR AND POSTERIOR LOWER UTERINE SEGMENTS AND ANTERIOR AND POSTERIOR CERVIX. - CARCINOMA INVADES FOR A DEPTH OF 1.9 CM WHERE MYOMETRIAL THICKNESS IS 2.0 CM (GREATER THAN 50% MYOMETRIAL INVASION) - CARCINOMA IS 1 MM FROM THE CERVICAL MARGIN - NEGATIVE FOR LYMPHOVASCULAR OR PERINEURAL INVASION - BILATERAL UNREMARKABLE FALLOPIAN TUBES AND OVARIES, NEGATIVE FOR CARCINOMA - SEE ONCOLOGY TABLE 2. Lymph node, biopsy, right pelvic - METASTATIC CARCINOMA TO ONE OF TWO LYMPH NODES (1/2) Microscopic Comment 1. (v4.1.0.0) UTERUS, CARCINOMA OR CARCINOSARCOMA Procedure: Total hysterectomy with bilateral salpingo-oophorectomy Histologic type: Mixed high-grade serous and endometrioid adenocarcinoma Histologic Grade:  NA Myometrial invasion: Present Depth of invasion: 19 mm Myometrial thickness: 20 mm Uterine Serosa Involvement: Not identified Cervical stromal involvement: Present Extent of involvement of other organs: NA Lymphovascular invasion: Not identified Regional Lymph Nodes: Examined: 0 Sentinel 2 Non-sentinel 2 Total Lymph nodes with metastasis: 1 Isolated tumor cells (< 0.2 mm): 0 Micrometastasis: (> 0.2 mm and < 2.0 mm): 1 Macrometastasis: (> 2.0 mm): 0 Extracapsular extension: Not identified Tumor block for ancillary studies: 41F MMR / MSI testing: Pending Pathologic Stage Classification (pTNM, AJCC 8th edition): pT2, pN67m FIGO Stage: IIIC1 Representative Tumor Block: 1A, 1C (v4.1.0.0) Diagnosis Note 1. Immunohistochemical stains for p16, p53, ER and Ki-67 show a pattern of staining consistent with mixed high-grade serous and endometrial adenocarcinoma. Dr. KLyndon Codehas reviewed this case and concurs with the above interpretation. A molecular study for microsatellite instability (MSI) and immunostains for MMR-related proteins are pending and will be reported in an addendum.   02/12/2019 Surgery   Pre-operative Diagnosis: endometrial cancer stage IIIC2, s/p neoadjuvant chemotherapy, extreme obesity (BMI 53kg/m2)  Operation: Robotic-assisted laparoscopic total hysterectomy with bilateral salpingoophorectomy, right pelvic lymphadenectomy. 22 modifier for extreme obesity  Surgeon: RDonaciano Eva Assistant Surgeon: LLahoma CrockerMD  Operative Findings:  : extreme obesity, BMI 53kg/m2, with significant retroperitoneal and intraperitoneal adiposity. Obesity necessitated an additional hour of operating time to create safe exposure. Obesity required additional personnel in the operating room for positioning and retraction. Severe obesity substantially increased the complexity of the procedure. 8cm uterus, grossly normal, normal appearing cervix.  Retroperitoneal fibrosis consistent  with nodal positivity. Clinically suspicious right pelvic lymph node. Unable to completely visualize retroperitoneal nodes due to extreme obesity.    02/12/2019 Pathology Results   IMMUNOHISTOCHEMICAL AND MORPHOMETRIC ANALYSIS PERFORMED MANUALLY The tumor cells are POSITIVE for Her2 (3+). Estrogen Receptor: 70%, POSITIVE, STRONG STAINING INTENSITY Progesterone Receptor: 70%, POSITIVE, STRONG STAINING INTENSITY Proliferation Marker Ki67: 20% REFERENCE RANGE ESTROGEN RECEPTOR NEGATIVE 0% POSITIVE =>1% REFERENCE RANGE PROGESTERONE RECEPTOR NEGATIVE 0% POSITIVE =>1% All controls stained appropriately  1. Uterus +/- tubes/ovaries, neoplastic, cervix, bilateral fallopian tubes and ovaries - MIXED HIGH-GRADE SEROUS AND ENDOMETRIAL ADENOCARCINOMA, 6.5 CM. SEE NOTE - CARCINOMA INVOLVES ENTIRE ENDOMETRIUM, ANTERIOR AND POSTERIOR LOWER UTERINE SEGMENTS AND ANTERIOR AND POSTERIOR CERVIX. - CARCINOMA INVADES FOR A DEPTH OF 1.9 CM WHERE MYOMETRIAL THICKNESS IS 2.0 CM (GREATER THAN 50% MYOMETRIAL INVASION) - CARCINOMA IS 1 MM FROM THE CERVICAL MARGIN - NEGATIVE FOR LYMPHOVASCULAR OR PERINEURAL INVASION - BILATERAL UNREMARKABLE FALLOPIAN TUBES AND OVARIES, NEGATIVE FOR CARCINOMA - SEE ONCOLOGY TABLE 2. Lymph node, biopsy, right pelvic - METASTATIC CARCINOMA TO ONE OF TWO LYMPH NODES (1/2) Microscopic Comment 1. (v4.1.0.0) UTERUS, CARCINOMA OR CARCINOSARCOMA Procedure: Total hysterectomy with bilateral salpingo-oophorectomy Histologic type: Mixed high-grade serous and endometrioid adenocarcinoma Histologic Grade: NA Myometrial invasion: Present Depth of invasion: 19 mm Myometrial thickness: 20 mm Uterine Serosa Involvement: Not identified Cervical stromal involvement: Present Extent of involvement of other organs: NA Lymphovascular invasion: Not identified Regional Lymph Nodes: Examined: 0 Sentinel 2 Non-sentinel 2 Total Lymph nodes with metastasis: 1 Isolated tumor cells (< 0.2 mm):  0 Micrometastasis: (> 0.2 mm and < 2.0 mm): 1 Macrometastasis: (> 2.0 mm): 0 Extracapsular extension: Not identified Tumor block for ancillary studies: 1F MMR / MSI testing: Pending Pathologic Stage Classification (pTNM, AJCC 8th edition): pT2, pN29m FIGO Stage: IIIC1 Representative Tumor Block: 1A, 1C   03/17/2019 Echocardiogram   1. The left ventricle has normal systolic function, with an ejection fraction of 55-60%. The cavity size was normal. There is moderate concentric left ventricular hypertrophy. Left ventricular diastolic Doppler parameters are consistent with impaired relaxation.  2. The right ventricle has normal systolic function. The cavity was normal. There is no increase in right ventricular wall thickness.  3. GLS: -15.5%, no prior study available for comparison.   03/17/2019 Tumor Marker   Patient's tumor was tested for the following markers: CA-125 Results of the tumor marker test revealed 43   03/20/2019 -  Chemotherapy   The patient had trastuzumab (HERCEPTIN) 693 mg in sodium chloride 0.9 % 250 mL chemo infusion, 8 mg/kg = 693 mg, Intravenous,  Once, 2 of 5 cycles Dose modification: 6 mg/kg (original dose 6 mg/kg, Cycle 2, Reason: Provider Judgment) Administration: 693 mg (03/20/2019), 504 mg (04/10/2019)  for chemotherapy treatment.      REVIEW OF SYSTEMS:   Constitutional: Denies fevers, chills or abnormal weight loss Eyes: Denies blurriness of vision Ears, nose, mouth, throat, and face: Denies mucositis or sore throat Respiratory: Denies cough, dyspnea or wheezes Cardiovascular: Denies palpitation, chest discomfort or lower extremity swelling Gastrointestinal:  Denies nausea, heartburn or change in bowel habits Skin: Denies abnormal skin rashes Lymphatics: Denies new lymphadenopathy or easy bruising Neurological:Denies numbness, tingling or new weaknesses Behavioral/Psych: Mood is stable, no new changes  All other systems were reviewed with the patient and are  negative.  I have reviewed the past medical history, past surgical history, social history and family history with the patient and they are unchanged from previous note.  ALLERGIES:  has No  Known Allergies.  MEDICATIONS:  Current Outpatient Medications  Medication Sig Dispense Refill  . dexamethasone (DECADRON) 4 MG tablet Take 3 tabs at the night before and 3 tab the morning of chemotherapy, every 3 weeks, by mouth 20 tablet 0  . diphenhydrAMINE (BENADRYL) 25 mg capsule Take 100 mg by mouth 2 (two) times a day.    . gabapentin (NEURONTIN) 400 MG capsule Take 3 capsules (1,200 mg total) by mouth 3 (three) times daily. 270 capsule 1  . hydrochlorothiazide (MICROZIDE) 12.5 MG capsule Take 12.5 mg by mouth daily.  0  . lidocaine-prilocaine (EMLA) cream Apply to affected area once (Patient taking differently: Apply 1 application topically daily as needed (prior to port being accessed.). Apply to affected area once) 30 g 3  . lidocaine-prilocaine (EMLA) cream Apply to affected area once 30 g 3  . losartan (COZAAR) 50 MG tablet Take 50 mg by mouth daily.  2  . ondansetron (ZOFRAN) 8 MG tablet Take 8 mg by mouth every 8 (eight) hours as needed.    Marland Kitchen oxyCODONE-acetaminophen (PERCOCET) 10-325 MG tablet Take 1-2 tablets by mouth every 6 (six) hours as needed for pain.    Marland Kitchen prochlorperazine (COMPAZINE) 10 MG tablet TAKE 1 TABLET BY MOUTH EVERY 6 HOURS AS NEEDED FOR NAUSEA OR VOMITING 30 tablet 1   No current facility-administered medications for this visit.     PHYSICAL EXAMINATION: ECOG PERFORMANCE STATUS: 1 - Symptomatic but completely ambulatory  Vitals:   04/30/19 0951  BP: (!) 144/93  Pulse: 70  Resp: 18  Temp: (!) 97.3 F (36.3 C)  SpO2: 99%   Filed Weights   04/30/19 0951  Weight: 297 lb 3.2 oz (134.8 kg)    GENERAL:alert, no distress and comfortable SKIN: skin color, texture, turgor are normal, no rashes or significant lesions EYES: normal, Conjunctiva are pink and  non-injected, sclera clear OROPHARYNX:no exudate, no erythema and lips, buccal mucosa, and tongue normal  NECK: supple, thyroid normal size, non-tender, without nodularity LYMPH:  no palpable lymphadenopathy in the cervical, axillary or inguinal LUNGS: clear to auscultation and percussion with normal breathing effort HEART: regular rate & rhythm and no murmurs and no lower extremity edema ABDOMEN:abdomen soft, non-tender and normal bowel sounds Musculoskeletal:no cyanosis of digits and no clubbing  NEURO: alert & oriented x 3 with fluent speech, no focal motor/sensory deficits  LABORATORY DATA:  I have reviewed the data as listed    Component Value Date/Time   NA 141 04/30/2019 0859   K 3.6 04/30/2019 0859   CL 104 04/30/2019 0859   CO2 26 04/30/2019 0859   GLUCOSE 131 (H) 04/30/2019 0859   BUN 16 04/30/2019 0859   CREATININE 0.73 04/30/2019 0859   CALCIUM 8.8 (L) 04/30/2019 0859   PROT 7.2 04/30/2019 0859   ALBUMIN 3.6 04/30/2019 0859   AST 18 04/30/2019 0859   ALT 16 04/30/2019 0859   ALKPHOS 98 04/30/2019 0859   BILITOT 0.2 (L) 04/30/2019 0859   GFRNONAA >60 04/30/2019 0859   GFRAA >60 04/30/2019 0859    No results found for: SPEP, UPEP  Lab Results  Component Value Date   WBC 3.9 (L) 04/30/2019   NEUTROABS 2.1 04/30/2019   HGB 10.4 (L) 04/30/2019   HCT 32.5 (L) 04/30/2019   MCV 105.5 (H) 04/30/2019   PLT 121 (L) 04/30/2019      Chemistry      Component Value Date/Time   NA 141 04/30/2019 0859   K 3.6 04/30/2019 0859   CL 104  04/30/2019 0859   CO2 26 04/30/2019 0859   BUN 16 04/30/2019 0859   CREATININE 0.73 04/30/2019 0859      Component Value Date/Time   CALCIUM 8.8 (L) 04/30/2019 0859   ALKPHOS 98 04/30/2019 0859   AST 18 04/30/2019 0859   ALT 16 04/30/2019 0859   BILITOT 0.2 (L) 04/30/2019 0859      All questions were answered. The patient knows to call the clinic with any problems, questions or concerns. No barriers to learning was detected.  I  spent 30 minutes counseling the patient face to face. The total time spent in the appointment was 40 minutes and more than 50% was on counseling and review of test results  Heath Lark, MD 04/30/2019 2:12 PM

## 2019-04-30 NOTE — Assessment & Plan Note (Signed)
She is developing grade 2 neuropathy Her calculated chemotherapy dose still come back fairly high I plan minor further reduction of Taxol She will complete carboplatin and Taxol tomorrow but we will continue Herceptin every 3 weeks I plan to repeat CT imaging in August and if that shows no evidence of disease progression, she will go on maintenance Herceptin only She will be due for echocardiogram in September.

## 2019-04-30 NOTE — Assessment & Plan Note (Signed)
She has mild persistent pancytopenia, improved since recent dose adjustment We will proceed with treatment without delay

## 2019-05-01 ENCOUNTER — Inpatient Hospital Stay: Payer: Medicare Other

## 2019-05-01 ENCOUNTER — Telehealth: Payer: Self-pay

## 2019-05-01 ENCOUNTER — Other Ambulatory Visit: Payer: Self-pay

## 2019-05-01 VITALS — BP 179/78 | HR 93 | Temp 98.5°F | Resp 20

## 2019-05-01 DIAGNOSIS — Z5112 Encounter for antineoplastic immunotherapy: Secondary | ICD-10-CM | POA: Diagnosis not present

## 2019-05-01 DIAGNOSIS — C541 Malignant neoplasm of endometrium: Secondary | ICD-10-CM

## 2019-05-01 DIAGNOSIS — Z5111 Encounter for antineoplastic chemotherapy: Secondary | ICD-10-CM | POA: Diagnosis not present

## 2019-05-01 DIAGNOSIS — Z9221 Personal history of antineoplastic chemotherapy: Secondary | ICD-10-CM | POA: Diagnosis not present

## 2019-05-01 DIAGNOSIS — Z79899 Other long term (current) drug therapy: Secondary | ICD-10-CM | POA: Diagnosis not present

## 2019-05-01 DIAGNOSIS — G629 Polyneuropathy, unspecified: Secondary | ICD-10-CM | POA: Diagnosis not present

## 2019-05-01 DIAGNOSIS — D61818 Other pancytopenia: Secondary | ICD-10-CM | POA: Diagnosis not present

## 2019-05-01 LAB — CA 125: Cancer Antigen (CA) 125: 29.2 U/mL (ref 0.0–38.1)

## 2019-05-01 MED ORDER — PALONOSETRON HCL INJECTION 0.25 MG/5ML
INTRAVENOUS | Status: AC
  Filled 2019-05-01: qty 5

## 2019-05-01 MED ORDER — SODIUM CHLORIDE 0.9% FLUSH
10.0000 mL | INTRAVENOUS | Status: DC | PRN
  Administered 2019-05-01: 15:00:00 10 mL
  Filled 2019-05-01: qty 10

## 2019-05-01 MED ORDER — SODIUM CHLORIDE 0.9 % IV SOLN
Freq: Once | INTRAVENOUS | Status: AC
  Administered 2019-05-01: 10:00:00 via INTRAVENOUS
  Filled 2019-05-01: qty 250

## 2019-05-01 MED ORDER — FAMOTIDINE IN NACL 20-0.9 MG/50ML-% IV SOLN
INTRAVENOUS | Status: AC
  Filled 2019-05-01: qty 50

## 2019-05-01 MED ORDER — TRASTUZUMAB CHEMO 150 MG IV SOLR
6.0000 mg/kg | Freq: Once | INTRAVENOUS | Status: AC
  Administered 2019-05-01: 504 mg via INTRAVENOUS
  Filled 2019-05-01: qty 24

## 2019-05-01 MED ORDER — PALONOSETRON HCL INJECTION 0.25 MG/5ML
0.2500 mg | Freq: Once | INTRAVENOUS | Status: AC
  Administered 2019-05-01: 0.25 mg via INTRAVENOUS

## 2019-05-01 MED ORDER — SODIUM CHLORIDE 0.9 % IV SOLN
87.5000 mg/m2 | Freq: Once | INTRAVENOUS | Status: AC
  Administered 2019-05-01: 216 mg via INTRAVENOUS
  Filled 2019-05-01: qty 36

## 2019-05-01 MED ORDER — SODIUM CHLORIDE 0.9 % IV SOLN
Freq: Once | INTRAVENOUS | Status: AC
  Administered 2019-05-01: 10:00:00 via INTRAVENOUS
  Filled 2019-05-01: qty 5

## 2019-05-01 MED ORDER — SODIUM CHLORIDE 0.9 % IV SOLN
700.0000 mg | Freq: Once | INTRAVENOUS | Status: AC
  Administered 2019-05-01: 14:00:00 700 mg via INTRAVENOUS
  Filled 2019-05-01: qty 70

## 2019-05-01 MED ORDER — HEPARIN SOD (PORK) LOCK FLUSH 100 UNIT/ML IV SOLN
500.0000 [IU] | Freq: Once | INTRAVENOUS | Status: AC | PRN
  Administered 2019-05-01: 500 [IU]
  Filled 2019-05-01: qty 5

## 2019-05-01 MED ORDER — FAMOTIDINE IN NACL 20-0.9 MG/50ML-% IV SOLN
20.0000 mg | Freq: Once | INTRAVENOUS | Status: AC
  Administered 2019-05-01: 10:00:00 20 mg via INTRAVENOUS

## 2019-05-01 MED ORDER — ACETAMINOPHEN 325 MG PO TABS
ORAL_TABLET | ORAL | Status: AC
  Filled 2019-05-01: qty 2

## 2019-05-01 MED ORDER — ACETAMINOPHEN 325 MG PO TABS
650.0000 mg | ORAL_TABLET | Freq: Once | ORAL | Status: AC
  Administered 2019-05-01: 650 mg via ORAL

## 2019-05-01 NOTE — Telephone Encounter (Signed)
Called and lvm with results

## 2019-05-01 NOTE — Patient Instructions (Signed)
[facility] Discharge Instructions for Patients Receiving Chemotherapy  Today you received the following chemotherapy agents: Herceptin, Taxol, Carboplatin   To help prevent nausea and vomiting after your treatment, we encourage you to take your nausea medication as directed.    If you develop nausea and vomiting that is not controlled by your nausea medication, call the clinic.   BELOW ARE SYMPTOMS THAT SHOULD BE REPORTED IMMEDIATELY:  *FEVER GREATER THAN 100.5 F  *CHILLS WITH OR WITHOUT FEVER  NAUSEA AND VOMITING THAT IS NOT CONTROLLED WITH YOUR NAUSEA MEDICATION  *UNUSUAL SHORTNESS OF BREATH  *UNUSUAL BRUISING OR BLEEDING  TENDERNESS IN MOUTH AND THROAT WITH OR WITHOUT PRESENCE OF ULCERS  *URINARY PROBLEMS  *BOWEL PROBLEMS  UNUSUAL RASH Items with * indicate a potential emergency and should be followed up as soon as possible.  Feel free to call the clinic should you have any questions or concerns. The clinic phone number is [phone].  Please show the [facility] at check-in to the Emergency Department and triage nurse.  Coronavirus (COVID-19) Are you at risk?  Are you at risk for the Coronavirus (COVID-19)?  To be considered HIGH RISK for Coronavirus (COVID-19), you have to meet the following criteria:  . Traveled to [place], [place], [place], [place] or [place]; or in the [place] to [place], [place], [place], or [place]; and have fever, cough, and shortness of breath within the last 2 weeks of travel OR . Been in close contact with a person diagnosed with COVID-19 within the last 2 weeks and have fever, cough, and shortness of breath . IF YOU DO NOT MEET THESE CRITERIA, YOU ARE CONSIDERED LOW RISK FOR COVID-19.  What to do if you are HIGH RISK for COVID-19?  Marland Kitchen If you are having a medical emergency, call 911. . Seek medical care right away. Before you go to a doctor's office, urgent care or emergency department, call ahead and  tell them about your recent travel, contact with someone diagnosed with COVID-19, and your symptoms. You should receive instructions from your physician's office regarding next steps of care.  . When you arrive at healthcare provider, tell the healthcare staff immediately you have returned from visiting [place], [place], [place], [place] or [place]; or traveled in the [place] to [place], [place], [place], or [place]; in the last two weeks or you have been in close contact with a person diagnosed with COVID-19 in the last 2 weeks.   . Tell the health care staff about your symptoms: fever, cough and shortness of breath. . After you have been seen by a medical provider, you will be either: o Tested for (COVID-19) and discharged home on quarantine except to seek medical care if symptoms worsen, and asked to  - Stay home and avoid contact with others until you get your results (4-5 days)  - Avoid travel on public transportation if possible (such as bus, train, or airplane) or o Sent to the Emergency Department by EMS for evaluation, COVID-19 testing, and possible admission depending on your condition and test results.  What to do if you are LOW RISK for COVID-19?  Reduce your risk of any infection by using the same precautions used for avoiding the common cold or flu:  Marland Kitchen Wash your hands often with soap and warm water for at least 20 seconds.  If soap and water are not readily available, use an alcohol-based hand sanitizer with at least 60% alcohol.  Marland Kitchen  If coughing or sneezing, cover your mouth and nose by coughing or sneezing into the elbow areas of your shirt or coat, into a tissue or into your sleeve (not your hands). . Avoid shaking hands with others and consider head nods or verbal greetings only. . Avoid touching your eyes, nose, or mouth with unwashed hands.  . Avoid close contact with people who are sick. . Avoid places or events with large numbers of people in one location, like  concerts or sporting events. . Carefully consider travel plans you have or are making. . If you are planning any travel outside or inside the [place], visit the CDC's Travelers' Health webpage for the latest health notices. . If you have some symptoms but not all symptoms, continue to monitor at home and seek medical attention if your symptoms worsen. . If you are having a medical emergency, call 911.   [facility] / e-Visit: [email or web address]         [facility]: [facility]: [phone]                   [facility]: [phone]

## 2019-05-01 NOTE — Telephone Encounter (Signed)
-----   Message from Heath Lark, MD sent at 05/01/2019  9:02 AM EDT ----- Regarding: CA-125 is better, pls let her know

## 2019-05-11 ENCOUNTER — Telehealth: Payer: Self-pay | Admitting: Oncology

## 2019-05-11 NOTE — Telephone Encounter (Signed)
Kathryn [name] with appointment for CT scan on 05/20/19 at 12 pm (npo 4 hours before, drink contrast at 10 am and 11 am).  Kathryn [name] verbalized understanding and agreement.

## 2019-05-12 ENCOUNTER — Ambulatory Visit: Payer: Medicare Other

## 2019-05-12 ENCOUNTER — Other Ambulatory Visit: Payer: Self-pay

## 2019-05-12 DIAGNOSIS — M6281 Muscle weakness (generalized): Secondary | ICD-10-CM | POA: Diagnosis not present

## 2019-05-12 DIAGNOSIS — M5441 Lumbago with sciatica, right side: Secondary | ICD-10-CM | POA: Diagnosis not present

## 2019-05-12 DIAGNOSIS — R262 Difficulty in walking, not elsewhere classified: Secondary | ICD-10-CM

## 2019-05-12 DIAGNOSIS — G8929 Other chronic pain: Secondary | ICD-10-CM | POA: Diagnosis not present

## 2019-05-12 DIAGNOSIS — M5442 Lumbago with sciatica, left side: Secondary | ICD-10-CM | POA: Diagnosis not present

## 2019-05-12 NOTE — Therapy (Signed)
[place], [place], [address] Phone: [phone]   Fax:  [phone]  Physical Therapy Treatment  Patient Details  Name: Kathryn [name] MRN: [id number] Date of Birth: 25-Apr-1951 Referring Provider (PT): Dr. Alvy Bimler    Encounter Date: 05/12/2019  PT End of Session - 05/12/19 1347    Visit Number  11    Number of Visits  16    Date for PT Re-Evaluation  05/14/19    PT Start Time  8588    PT Stop Time  1043    PT Time Calculation (min)  41 min    Activity Tolerance  Treatment limited secondary to medical complications (Comment)    Behavior During Therapy  [facility] for tasks assessed/performed       Past Medical History:  Diagnosis Date  . Arthritis    Knees and ankles fussed rt ankle  . Dyspnea    with excertion  . Hypertension   . Neuromuscular disorder ([place]) 1992   pinched nerve after back surgery numbness Lt leg lateral and last tow toes.    Past Surgical History:  Procedure Laterality Date  . ANKLE SURGERY Right    right fusion  . BACK SURGERY     5 surgeries at [facility]; has screws and a "cage"  . CARPAL TUNNEL RELEASE Bilateral   . COLONOSCOPY    . IR IMAGING GUIDED PORT INSERTION  10/01/2018  . KNEE SURGERY Left   . LAPAROSCOPIC CHOLECYSTECTOMY    . LUMBAR LAMINECTOMY/DECOMPRESSION MICRODISCECTOMY Left 08/06/2013   Procedure: LUMBAR LAMINECTOMY/DECOMPRESSION MICRODISCECTOMY 1 LEVEL;  Surgeon: Ophelia Charter, MD;  Location: [place] [facility];  Service: Neurosurgery;  Laterality: Left;  redo LEFT Lumbar Five-Sacral One diskectomy  . LYMPH NODE BIOPSY N/A 02/12/2019   Procedure: RIGHT PELVIC LYMPH NODE DISSECTION;  Surgeon: Everitt Amber, MD;  Location: [facility];  Service: Gynecology;  Laterality: N/A;  . PLACEMENT OF LUMBAR DRAIN N/A 08/07/2013   Procedure: PLACEMENT OF LUMBAR DRAIN;  Surgeon: Ophelia Charter, MD;  Location: [place];  Service: Neurosurgery;  Laterality: N/A;  . ROBOTIC ASSISTED  TOTAL HYSTERECTOMY WITH BILATERAL SALPINGO OOPHERECTOMY Bilateral 02/12/2019   Procedure: XI ROBOTIC ASSISTED TOTAL HYSTERECTOMY WITH BILATERAL SALPINGO OOPHORECTOMY;  Surgeon: Everitt Amber, MD;  Location: [facility];  Service: Gynecology;  Laterality: Bilateral;  . TONSILLECTOMY    . TUBAL LIGATION  1982    There were no vitals filed for this visit.  Subjective Assessment - 05/12/19 1006    Subjective  I haven't bounced back as well from this last treatment. I had diarrheaa up until yesterday so now I just feel weak and tired. I only come 1x/week so I really wanted to come. And my dose of Gabapentin was increased and I can tell an improvement in my feet. Tenderness at night is better too, so that's good.    Pertinent History  Endometrial cancer diagnosed September 01, 2018  First  chemotherapy Dec. 20  and will have 3 treatments , one every 3 weeks, then retesting and possibly surgery and then 3 more chemos. but that schedule may change  past history includes 6 back surgeries from L1 down starting in 1974 due to degenerative disc disease from the neck down She had an MRI in May of 2018 and was told there were so many areas of compression that it was not able to tell where leg pain was coming from.  She is followed by Dr. Koleen Nimrod at [facility] and she  has epidurals but is not able have them anymore because there is no space for  the needle.    Patient Stated Goals  to get back in the water again and go to the grocery store and not be out of breath                       [facility] Adult PT Treatment/Exercise - 05/12/19 0001      Self-Care   Other Self-Care Comments   Pt filled therapist in on what HEP she has been able to tolerate since last chemo treatment. And that she has a CT scan next week to determine what her next step of chemo treatment will be. Also ways to incorporate modified versions of current HEP when she feels weaker at these times of feeling weaker when recovering from  chemo. She verbalized understanding all and felt these were achieveable.      Exercises   Exercises  Other Exercises    Other Exercises   Reviewed and had pt perform 2-3 sets of the following leaning against the wall instead of counter as felt safer and more supported when weaker: Wall squats with 5 sec holds, wall push ups, marching, facing wall with hands on wall for hell/toe raises, heel/toe balance and walking and bicep curls then demonstrated 3 way hip raises as pt was becoming fatigued.                PT Short Term Goals - 10/16/18 1549      PT SHORT TERM GOAL #1   Title  Pt will report she is able to do a home exercise program and document her progress at home     Time  4    Period  Weeks    Status  New        PT Long Term Goals - 03/19/19 1149      PT LONG TERM GOAL #1   Title  Pt will decrease her time for Normal TUG to < 10 seconds indicating an improvment in balance and gait    Baseline  12.76; 12.25-2/19/20; did not retest today but pt reports sit-stand easier now as her legs are feeling stronger-12/17/18, 03/19/19- 16.1 seconds      PT LONG TERM GOAL #2   Title  Pt with increase # of reps of sit to stand in 30 seconds to < 13 indicating and increase in general strength    Baseline  11; 11 - 12/03/18; did not retest otday but pt is demonstrating greater ease with sit-stand multiple reps during session-12/17/18, 03/19/19- 9 reps pt very short of breath after this    Time  8    Period  Weeks    Status  On-going      PT LONG TERM GOAL #3   Title  Pt will report she is able to climb steps with 25% less difficulty so that she can get in and out of the pool for water exercise     Baseline  Pt still struggling with stairs so hasn't been back to pool-12/03/18; pt reports though she hasn't been back to pool yet her arms and legs are feeling stronger with all ADLs-3/4/2, 03/19/19- pt reports she is still having a lot of difficulty and has not really used the steps at all    Time  8     Period  Weeks    Status  On-going  Plan - 05/12/19 1348    Clinical Impression Statement  Pt arrives at clinic today reporting still feeling very weak and fatigued as she did recover as well from this last chemo treatment as she has been. She had diarrhea up until last night and that had gone on since Saturday. So we spent some of the session discussing ways she can modify her HEP when she is feeling this weak. Alot of her exercises were able to be done leaning against the wall as she doesn't feel as safe at the counter and the wall offers her more support. She was able to return demo of some of these exercises today but quickly became fatigued.    Rehab Potential  Good    Clinical Impairments Affecting Rehab Potential  limited back mobility and diffuse degernative disc disease , pt will not be able to come the week of chemo     PT Frequency  1x / week   pt will not week after each chemo   PT Duration  8 weeks    PT Treatment/Interventions  ADLs/Self Care Home Management;DME Instruction;Gait training;Stair training;Functional mobility training;Cognitive remediation;Neuromuscular re-education;Therapeutic exercise;Therapeutic activities;Patient/family education;Energy conservation    PT Next Visit Plan  Renewal at next session. Cont with balance and endurance activities, including functional UE and LE strength being mindful of new back and knee pain since fall; work on progressing HEP once current one is no longer challenging, maybe instruct pt in circuit training type exercises.    Consulted and Agree with Plan of Care  Patient       Patient will benefit from skilled therapeutic intervention in order to improve the following deficits and impairments:  Abnormal gait, Decreased knowledge of use of DME, Pain, Postural dysfunction, Increased muscle spasms, Decreased scar mobility, Decreased mobility, Decreased activity tolerance, Decreased endurance, Decreased strength, Impaired  perceived functional ability, Obesity, Difficulty walking, Decreased balance, Decreased knowledge of precautions  Visit Diagnosis: 1. Difficulty in walking   2. Muscle weakness (generalized)   3. Chronic bilateral low back pain with bilateral sciatica        Problem List Patient Active Problem List   Diagnosis Date Noted  . Pancytopenia, acquired ([place]) 04/10/2019  . Hypokalemia due to loss of potassium 01/16/2019  . Steroid-induced hyperglycemia 11/14/2018  . Chemotherapy-induced nausea 10/24/2018  . Other constipation 10/24/2018  . Insomnia due to medical condition 10/07/2018  . Chronic back pain greater than 3 months duration 09/26/2018  . Neuropathy due to medical condition ([place]) 09/26/2018  . Physical debility 09/26/2018  . Morbid (severe) obesity due to excess calories ([place]) 09/26/2018  . Endometrial cancer ([place]) 09/24/2018  . Adenopathy 09/24/2018    Otelia Limes, PTA 05/12/2019, 1:54 PM  [place], [place], [address] Phone: [phone]   Fax:  [phone]  Name: Kathryn [name] MRN: [id number] Date of Birth: Mar 17, 1951

## 2019-05-19 ENCOUNTER — Other Ambulatory Visit: Payer: Self-pay

## 2019-05-19 ENCOUNTER — Ambulatory Visit: Payer: Medicare Other | Attending: Hematology and Oncology

## 2019-05-19 DIAGNOSIS — M5442 Lumbago with sciatica, left side: Secondary | ICD-10-CM | POA: Diagnosis not present

## 2019-05-19 DIAGNOSIS — M6281 Muscle weakness (generalized): Secondary | ICD-10-CM | POA: Insufficient documentation

## 2019-05-19 DIAGNOSIS — M5441 Lumbago with sciatica, right side: Secondary | ICD-10-CM | POA: Diagnosis not present

## 2019-05-19 DIAGNOSIS — G8929 Other chronic pain: Secondary | ICD-10-CM | POA: Diagnosis not present

## 2019-05-19 DIAGNOSIS — R262 Difficulty in walking, not elsewhere classified: Secondary | ICD-10-CM | POA: Diagnosis not present

## 2019-05-19 NOTE — Therapy (Signed)
[place], [place], [address] Phone: [phone]   Fax:  [phone]  Physical Therapy Treatment  Patient Details  Name: Kathryn [name] MRN: [id number] Date of Birth: 1951/01/13 Referring Provider (PT): Dr. Alvy Bimler    Encounter Date: 05/19/2019  PT End of Session - 05/19/19 1014    Visit Number  12    Number of Visits  20   count is less due to needs to skip the week after chemo   Date for PT Re-Evaluation  06/30/19    PT Start Time  1002    PT Stop Time  1053    PT Time Calculation (min)  51 min    Activity Tolerance  Patient tolerated treatment well    Behavior During Therapy  [facility] for tasks assessed/performed       Past Medical History:  Diagnosis Date  . Arthritis    Knees and ankles fussed rt ankle  . Dyspnea    with excertion  . Hypertension   . Neuromuscular disorder ([place]) 1992   pinched nerve after back surgery numbness Lt leg lateral and last tow toes.    Past Surgical History:  Procedure Laterality Date  . ANKLE SURGERY Right    right fusion  . BACK SURGERY     5 surgeries at [facility]; has screws and a "cage"  . CARPAL TUNNEL RELEASE Bilateral   . COLONOSCOPY    . IR IMAGING GUIDED PORT INSERTION  10/01/2018  . KNEE SURGERY Left   . LAPAROSCOPIC CHOLECYSTECTOMY    . LUMBAR LAMINECTOMY/DECOMPRESSION MICRODISCECTOMY Left 08/06/2013   Procedure: LUMBAR LAMINECTOMY/DECOMPRESSION MICRODISCECTOMY 1 LEVEL;  Surgeon: Ophelia Charter, MD;  Location: [place] [facility];  Service: Neurosurgery;  Laterality: Left;  redo LEFT Lumbar Five-Sacral One diskectomy  . LYMPH NODE BIOPSY N/A 02/12/2019   Procedure: RIGHT PELVIC LYMPH NODE DISSECTION;  Surgeon: Everitt Amber, MD;  Location: [facility];  Service: Gynecology;  Laterality: N/A;  . PLACEMENT OF LUMBAR DRAIN N/A 08/07/2013   Procedure: PLACEMENT OF LUMBAR DRAIN;  Surgeon: Ophelia Charter, MD;  Location: [place];  Service: Neurosurgery;  Laterality:  N/A;  . ROBOTIC ASSISTED TOTAL HYSTERECTOMY WITH BILATERAL SALPINGO OOPHERECTOMY Bilateral 02/12/2019   Procedure: XI ROBOTIC ASSISTED TOTAL HYSTERECTOMY WITH BILATERAL SALPINGO OOPHORECTOMY;  Surgeon: Everitt Amber, MD;  Location: [facility];  Service: Gynecology;  Laterality: Bilateral;  . TONSILLECTOMY    . TUBAL LIGATION  1982    There were no vitals filed for this visit.  Subjective Assessment - 05/19/19 1007    Subjective  I'm finally feeling better this week. I ended up having diarrhea a few more days after I saw you last but I'm finally over that. Now just trying to get my energy back.    Pertinent History  Endometrial cancer diagnosed September 01, 2018  First  chemotherapy Dec. 20  and will have 3 treatments , one every 3 weeks, then retesting and possibly surgery and then 3 more chemos. but that schedule may change  past history includes 6 back surgeries from L1 down starting in 1974 due to degenerative disc disease from the neck down She had an MRI in May of 2018 and was told there were so many areas of compression that it was not able to tell where leg pain was coming from.  She is followed by Dr. Koleen Nimrod at [facility] and she has epidurals but is not able have them anymore because there is no space for  the needle.    Patient Stated Goals  to get back in the water again and go to the grocery store and not be out of breath    Currently in Pain?  No/denies         [facility] PT Assessment - 05/19/19 0001      Sit to Stand   Comments  9 reps in 30 secondswith very min SOB after      Timed Up and Go Test   Normal TUG (seconds)  13.76   no cane                  [facility] Adult PT Treatment/Exercise - 05/19/19 0001      Exercises   Other Exercises   Pt participated in increased walking today going to and from ortho gym and practicing up and down stairs. She had mod SOB after and required sitting rest break before TUG and sit to stand tests.       Knee/Hip Exercises:  Aerobic   Nustep  Level 4, x 12 mins      Knee/Hip Exercises: Standing   Heel Raises  Both;20 reps   and toe raises at counter on [employer]   Hip Flexion  Stengthening;Right;Left;10 reps;Knee bent   at counter on [employer] for incr challenge   Hip Abduction  Stengthening;Right;Left;10 reps;Knee straight   at counter on [employer]   Abduction Limitations  excellent technique today    Hip Extension  Stengthening;Right;Left;10 reps;Knee straight   at counter on [employer]   Extension Limitations  VCs not to lean forward               PT Short Term Goals - 10/16/18 1549      PT SHORT TERM GOAL #1   Title  Pt will report she is able to do a home exercise program and document her progress at home     Time  4    Period  Weeks    Status  New        PT Long Term Goals - 05/19/19 1030      PT LONG TERM GOAL #1   Title  Pt will decrease her time for Normal TUG to < 10 seconds indicating an improvment in balance and gait    Baseline  12.76; 12.25-2/19/20; did not retest today but pt reports sit-stand easier now as her legs are feeling stronger-12/17/18, 03/19/19- 16.1 seconds; 13.76 sec with no cane - 05/19/19    Status  On-going      PT LONG TERM GOAL #2   Title  Pt with increase # of reps of sit to stand in 30 seconds to < 13 indicating and increase in general strength    Baseline  11; 11 - 12/03/18; did not retest otday but pt is demonstrating greater ease with sit-stand multiple reps during session-12/17/18, 03/19/19- 9 reps pt very short of breath after this; 11 reps and very minimal SOB after today - 05/19/19    Status  On-going      PT LONG TERM GOAL #3   Title  Pt will report she is able to climb steps with 25% less difficulty so that she can get in and out of the pool for water exercise     Baseline  Pt still struggling with stairs so hasn't been back to pool-12/03/18; pt reports though she hasn't been back to pool yet her arms and legs are feeling stronger with all ADLs-3/4/2, 03/19/19- pt reports she  is still  having a lot of difficulty and has not really used the steps at all; doing steps pt was able to alt steps with +1-2 handrails up and down and reports 50-60% greater ease with this since start of care - 05/19/19    Status  Achieved            Plan - 05/19/19 1055    Clinical Impression Statement  Pt feeling much better today than at last visit and with increased energy as well. She was able to tolerate 12 mins on NuStep and improved her TUG (with no AD) time and her sit to stand in 30 sec as well making good progress towards these goals. She also reports 50-60% greater ease at this time with going up/down stairs and having more confidence meeting that goal. Renewal will be done today and Allyson Sabal, PT is agreeable tot his, as pt is amking good progress towards goals but here progress is inconsistent at times during weeks after each chemo treatment, however she only has a 3 chemo treatments left then will be transitioning to just Herceptin. Pt is agreeable to cont 1x/ week taking off week after chemo due to extreme fatigue.    Rehab Potential  Good    Clinical Impairments Affecting Rehab Potential  limited back mobility and diffuse degernative disc disease , pt will not be able to come the week of chemo     PT Frequency  1x / week   taking each week after chemo off   PT Duration  6 weeks    PT Next Visit Plan  Renewal done this session. Focus on instructing pt circuit type training exs for home. Cont with balance and endurance activities, including functional UE and LE strength being mindful of new back and knee pain since fall; work on progressing HEP once current one is no longer challenging.    Consulted and Agree with Plan of Care  Patient       Patient will benefit from skilled therapeutic intervention in order to improve the following deficits and impairments:  Abnormal gait, Decreased knowledge of use of DME, Pain, Postural dysfunction, Increased muscle spasms, Decreased scar  mobility, Decreased mobility, Decreased activity tolerance, Decreased endurance, Decreased strength, Impaired perceived functional ability, Obesity, Difficulty walking, Decreased balance, Decreased knowledge of precautions  Visit Diagnosis: 1. Difficulty in walking   2. Muscle weakness (generalized)   3. Chronic bilateral low back pain with bilateral sciatica        Problem List Patient Active Problem List   Diagnosis Date Noted  . Pancytopenia, acquired ([place]) 04/10/2019  . Hypokalemia due to loss of potassium 01/16/2019  . Steroid-induced hyperglycemia 11/14/2018  . Chemotherapy-induced nausea 10/24/2018  . Other constipation 10/24/2018  . Insomnia due to medical condition 10/07/2018  . Chronic back pain greater than 3 months duration 09/26/2018  . Neuropathy due to medical condition ([place]) 09/26/2018  . Physical debility 09/26/2018  . Morbid (severe) obesity due to excess calories ([place]) 09/26/2018  . Endometrial cancer ([place]) 09/24/2018  . Adenopathy 09/24/2018    Otelia Limes, PTA 05/19/2019, 11:03 AM  [place], [place], [address] Phone: [phone]   Fax:  [phone]  Name: Kathryn [name] MRN: [id number] Date of Birth: 14-Dec-1950

## 2019-05-20 ENCOUNTER — Ambulatory Visit (HOSPITAL_COMMUNITY)
Admission: RE | Admit: 2019-05-20 | Discharge: 2019-05-20 | Disposition: A | Discharge status: Home / Self Care | Payer: Medicare Other | Source: Ambulatory Visit | Attending: Hematology and Oncology | Admitting: Hematology and Oncology

## 2019-05-20 ENCOUNTER — Other Ambulatory Visit: Payer: Self-pay

## 2019-05-20 ENCOUNTER — Encounter (HOSPITAL_COMMUNITY): Payer: Self-pay

## 2019-05-20 DIAGNOSIS — C541 Malignant neoplasm of endometrium: Secondary | ICD-10-CM | POA: Insufficient documentation

## 2019-05-20 HISTORY — DX: Malignant (primary) neoplasm, unspecified: C80.1

## 2019-05-20 MED ORDER — IOHEXOL 300 MG/ML  SOLN
100.0000 mL | Freq: Once | INTRAMUSCULAR | Status: AC | PRN
  Administered 2019-05-20: 100 mL via INTRAVENOUS

## 2019-05-20 MED ORDER — HEPARIN SOD (PORK) LOCK FLUSH 100 UNIT/ML IV SOLN
INTRAVENOUS | Status: AC
  Filled 2019-05-20: qty 5

## 2019-05-20 MED ORDER — SODIUM CHLORIDE (PF) 0.9 % IJ SOLN
INTRAMUSCULAR | Status: AC
  Filled 2019-05-20: qty 50

## 2019-05-20 MED ORDER — HEPARIN SOD (PORK) LOCK FLUSH 100 UNIT/ML IV SOLN
500.0000 [IU] | Freq: Once | INTRAVENOUS | Status: AC
  Administered 2019-05-20: 500 [IU] via INTRAVENOUS

## 2019-05-21 ENCOUNTER — Inpatient Hospital Stay: Payer: Medicare Other

## 2019-05-21 ENCOUNTER — Inpatient Hospital Stay (HOSPITAL_BASED_OUTPATIENT_CLINIC_OR_DEPARTMENT_OTHER): Payer: Medicare Other | Admitting: Hematology and Oncology

## 2019-05-21 ENCOUNTER — Inpatient Hospital Stay: Payer: Medicare Other | Attending: Hematology and Oncology

## 2019-05-21 ENCOUNTER — Other Ambulatory Visit: Payer: Self-pay

## 2019-05-21 VITALS — BP 170/81 | HR 77 | Temp 98.3°F | Resp 18 | Ht 63.0 in | Wt 296.2 lb

## 2019-05-21 DIAGNOSIS — R5381 Other malaise: Secondary | ICD-10-CM | POA: Diagnosis not present

## 2019-05-21 DIAGNOSIS — C541 Malignant neoplasm of endometrium: Secondary | ICD-10-CM

## 2019-05-21 DIAGNOSIS — Z79899 Other long term (current) drug therapy: Secondary | ICD-10-CM | POA: Diagnosis not present

## 2019-05-21 DIAGNOSIS — Z5112 Encounter for antineoplastic immunotherapy: Secondary | ICD-10-CM | POA: Insufficient documentation

## 2019-05-21 DIAGNOSIS — G629 Polyneuropathy, unspecified: Secondary | ICD-10-CM | POA: Insufficient documentation

## 2019-05-21 DIAGNOSIS — G63 Polyneuropathy in diseases classified elsewhere: Secondary | ICD-10-CM

## 2019-05-21 DIAGNOSIS — Z5111 Encounter for antineoplastic chemotherapy: Secondary | ICD-10-CM | POA: Insufficient documentation

## 2019-05-21 DIAGNOSIS — G8929 Other chronic pain: Secondary | ICD-10-CM | POA: Diagnosis not present

## 2019-05-21 DIAGNOSIS — D61818 Other pancytopenia: Secondary | ICD-10-CM | POA: Insufficient documentation

## 2019-05-21 DIAGNOSIS — M549 Dorsalgia, unspecified: Secondary | ICD-10-CM | POA: Insufficient documentation

## 2019-05-21 DIAGNOSIS — R188 Other ascites: Secondary | ICD-10-CM | POA: Diagnosis not present

## 2019-05-21 LAB — CBC WITH DIFFERENTIAL (CANCER CENTER ONLY)
Abs Immature Granulocytes: 0.02 10*3/uL (ref 0.00–0.07)
Basophils Absolute: 0 10*3/uL (ref 0.0–0.1)
Basophils Relative: 0 %
Eosinophils Absolute: 0 10*3/uL (ref 0.0–0.5)
Eosinophils Relative: 0 %
HCT: 32.4 % — ABNORMAL LOW (ref 36.0–46.0)
Hemoglobin: 10.5 g/dL — ABNORMAL LOW (ref 12.0–15.0)
Immature Granulocytes: 1 %
Lymphocytes Relative: 46 %
Lymphs Abs: 1.8 10*3/uL (ref 0.7–4.0)
MCH: 33.9 pg (ref 26.0–34.0)
MCHC: 32.4 g/dL (ref 30.0–36.0)
MCV: 104.5 fL — ABNORMAL HIGH (ref 80.0–100.0)
Monocytes Absolute: 0.5 10*3/uL (ref 0.1–1.0)
Monocytes Relative: 14 %
Neutro Abs: 1.5 10*3/uL — ABNORMAL LOW (ref 1.7–7.7)
Neutrophils Relative %: 39 %
Platelet Count: 103 10*3/uL — ABNORMAL LOW (ref 150–400)
RBC: 3.1 MIL/uL — ABNORMAL LOW (ref 3.87–5.11)
RDW: 17.9 % — ABNORMAL HIGH (ref 11.5–15.5)
WBC Count: 3.8 10*3/uL — ABNORMAL LOW (ref 4.0–10.5)
nRBC: 0 % (ref 0.0–0.2)

## 2019-05-21 LAB — CMP (CANCER CENTER ONLY)
ALT: 19 U/L (ref 0–44)
AST: 19 U/L (ref 15–41)
Albumin: 4 g/dL (ref 3.5–5.0)
Alkaline Phosphatase: 110 U/L (ref 38–126)
Anion gap: 12 (ref 5–15)
BUN: 13 mg/dL (ref 8–23)
CO2: 27 mmol/L (ref 22–32)
Calcium: 9.2 mg/dL (ref 8.9–10.3)
Chloride: 103 mmol/L (ref 98–111)
Creatinine: 0.78 mg/dL (ref 0.44–1.00)
GFR, Est AFR Am: >60 mL/min (ref >60)
GFR, Estimated: >60 mL/min (ref >60)
Glucose, Bld: 110 mg/dL — ABNORMAL HIGH (ref 70–99)
Potassium: 3.7 mmol/L (ref 3.5–5.1)
Sodium: 142 mmol/L (ref 135–145)
Total Bilirubin: 0.3 mg/dL (ref 0.3–1.2)
Total Protein: 7.4 g/dL (ref 6.5–8.1)

## 2019-05-21 MED ORDER — SODIUM CHLORIDE 0.9% FLUSH
10.0000 mL | Freq: Once | INTRAVENOUS | Status: AC
  Administered 2019-05-21: 10 mL
  Filled 2019-05-21: qty 10

## 2019-05-21 MED ORDER — SODIUM CHLORIDE 0.9 % IV SOLN
Freq: Once | INTRAVENOUS | Status: AC
  Administered 2019-05-21: 12:00:00 via INTRAVENOUS
  Filled 2019-05-21: qty 250

## 2019-05-21 MED ORDER — DIPHENHYDRAMINE HCL 25 MG PO CAPS: 25.0000 mg | ORAL_CAPSULE | Freq: Once | ORAL | Status: DC

## 2019-05-21 MED ORDER — HEPARIN SOD (PORK) LOCK FLUSH 100 UNIT/ML IV SOLN
500.0000 [IU] | Freq: Once | INTRAVENOUS | Status: AC | PRN
  Administered 2019-05-21: 500 [IU]
  Filled 2019-05-21: qty 5

## 2019-05-21 MED ORDER — ACETAMINOPHEN 325 MG PO TABS
ORAL_TABLET | ORAL | Status: AC
  Filled 2019-05-21: qty 2

## 2019-05-21 MED ORDER — ACETAMINOPHEN 325 MG PO TABS
650.0000 mg | ORAL_TABLET | Freq: Once | ORAL | Status: AC
  Administered 2019-05-21: 650 mg via ORAL

## 2019-05-21 MED ORDER — TRASTUZUMAB CHEMO 150 MG IV SOLR
6.0000 mg/kg | Freq: Once | INTRAVENOUS | Status: AC
  Administered 2019-05-21: 504 mg via INTRAVENOUS
  Filled 2019-05-21: qty 24

## 2019-05-21 MED ORDER — SODIUM CHLORIDE 0.9% FLUSH
10.0000 mL | INTRAVENOUS | Status: DC | PRN
  Administered 2019-05-21: 10 mL
  Filled 2019-05-21: qty 10

## 2019-05-21 NOTE — Patient Instructions (Signed)
[facility] Discharge Instructions for Patients Receiving Chemotherapy  Today you received the following chemotherapy agent: Herceptin  To help prevent nausea and vomiting after your treatment, we encourage you to take your nausea medication as directed.   If you develop nausea and vomiting that is not controlled by your nausea medication, call the clinic.   BELOW ARE SYMPTOMS THAT SHOULD BE REPORTED IMMEDIATELY:  *FEVER GREATER THAN 100.5 F  *CHILLS WITH OR WITHOUT FEVER  NAUSEA AND VOMITING THAT IS NOT CONTROLLED WITH YOUR NAUSEA MEDICATION  *UNUSUAL SHORTNESS OF BREATH  *UNUSUAL BRUISING OR BLEEDING  TENDERNESS IN MOUTH AND THROAT WITH OR WITHOUT PRESENCE OF ULCERS  *URINARY PROBLEMS  *BOWEL PROBLEMS  UNUSUAL RASH Items with * indicate a potential emergency and should be followed up as soon as possible.  Feel free to call the clinic should you have any questions or concerns. The clinic phone number is [phone].  Please show the CHEMO ALERT CARD at check-in to the Emergency Department and triage nurse.   

## 2019-05-22 ENCOUNTER — Encounter: Payer: Self-pay | Admitting: Hematology and Oncology

## 2019-05-22 LAB — CA 125: Cancer Antigen (CA) 125: 28.3 U/mL (ref 0.0–38.1)

## 2019-05-22 NOTE — Progress Notes (Signed)
[facility] OFFICE PROGRESS NOTE  Patient Care Team: Ronita Hipps, MD as PCP - General (Family Medicine) Nicholaus Bloom, MD (Anesthesiology)  ASSESSMENT & PLAN:  Endometrial cancer [facility]) I have reviewed multiple imaging studies with the patient and also discussed the test results with her family over the phone She have no signs of cancer recurrence I suspect the mild ascites seen by the radiologist, which I cannot confirm myself, could be due to residual fluid from her surgery We will continue maintaining Herceptin only every 3 weeks, indefinitely I will schedule echocardiogram next week for follow-up   Pancytopenia, acquired ([place]) She has mild persistent pancytopenia, improved since recent dose adjustment We will proceed with treatment without delay  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 ECHOCARDIOGRAM COMPLETE    Standing Status:   Future    Standing Expiration Date:   08/20/2020    Order Specific Question:   Where should this test be performed    Answer:   [place]    Order Specific Question:   Perflutren DEFINITY (image enhancing agent) should be administered unless hypersensitivity or allergy exist    Answer:   Administer Perflutren    Order Specific Question:   Reason for exam-Echo    Answer:   Chemotherapy evaluation  v87.41 / v58.11    INTERVAL HISTORY: Please see below for problem oriented charting. 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 A Pap was done 09/02/18 showign ASC-H. She was referred onto GYN and seen by Dr. Paula Compton    09/09/2018 Procedure   She underwent endometrial biopsy   09/12/2018 Pathology Results   Endometrial biopsy positive for adenocarcinoma   09/22/2018 Imaging   Ct abdomen and pelvis showed: Diffuse endometrial thickening, consistent with primary endometrial carcinoma.  Pelvic and abdominal retroperitoneal and retrocrural lymphadenopathy, consistent with metastatic disease.  Moderate hepatic steatosis.   09/25/2018 Cancer Staging   Staging form: Corpus Uteri - Carcinoma and Carcinosarcoma, AJCC 8th Edition - Pathologic: Stage IIIC2 (pT2, pN2, cM0) - Signed by Heath Lark, MD on 02/23/2019   10/01/2018 Procedure   Successful placement of a right IJ approach Power Port with ultrasound and fluoroscopic guidance. The catheter is ready for use.   10/02/2018 Tumor Marker   Patient's tumor was tested for the following markers: CA-125 Results of the tumor marker test revealed 578   10/03/2018 - 01/16/2019 Chemotherapy   The patient had carboplatin and Taxol with dose adjustment due to neuropathy x 6 cycles   10/24/2018 Tumor Marker   Patient's tumor was tested for the following markers: CA-125 Results of the tumor marker test revealed 296    Genetic Testing   Patient has genetic testing done for MSI/MMR. Results revealed MSI stable and MMR normal on Accession [id number].   11/14/2018  Tumor Marker   Patient's tumor was tested for the following markers: CA-125 Results of the tumor marker test revealed 81   11/27/2018 Imaging   1. Significant decrease in abdominal retroperitoneal and bilateral iliac lymphadenopathy since  previous study. 2. Significant decrease in abnormal endometrial soft tissue density since prior study. 3. No new or progressive metastatic disease identified.   12/05/2018 Tumor Marker   Patient's tumor was tested for the following markers: CA-125 Results of the tumor marker test revealed 46.3   12/26/2018 Tumor Marker   Patient's tumor was tested for the following markers: CA-125 Results of the tumor marker test revealed 38.2   01/16/2019 Tumor Marker   Patient's tumor was tested for the following markers: CA-125 Results of the tumor marker test revealed 30.8   02/03/2019 Imaging   1. Numerous retroperitoneal, iliac, and pelvic lymph nodes are stable or slightly decreased in size compared to immediate prior examination dated 11/27/2018 and significantly decreased in size compared to exam dated 09/20/2018.  2.  Unchanged thickening of the endometrium.  3. Other chronic, incidental, and postoperative findings as detailed above.   02/12/2019 Imaging   1. Uterus +/- tubes/ovaries, neoplastic, cervix, bilateral fallopian tubes and ovaries - MIXED HIGH-GRADE SEROUS AND ENDOMETRIAL ADENOCARCINOMA, 6.5 CM. SEE NOTE - CARCINOMA INVOLVES ENTIRE ENDOMETRIUM, ANTERIOR AND POSTERIOR LOWER UTERINE SEGMENTS AND ANTERIOR AND POSTERIOR CERVIX. - CARCINOMA INVADES FOR A DEPTH OF 1.9 CM WHERE MYOMETRIAL THICKNESS IS 2.0 CM (GREATER THAN 50% MYOMETRIAL INVASION) - CARCINOMA IS 1 MM FROM THE CERVICAL MARGIN - NEGATIVE FOR LYMPHOVASCULAR OR PERINEURAL INVASION - BILATERAL UNREMARKABLE FALLOPIAN TUBES AND OVARIES, NEGATIVE FOR CARCINOMA - SEE ONCOLOGY TABLE 2. Lymph node, biopsy, right pelvic - METASTATIC CARCINOMA TO ONE OF TWO LYMPH NODES (1/2) Microscopic Comment 1. (v4.1.0.0) UTERUS, CARCINOMA OR CARCINOSARCOMA Procedure: Total hysterectomy with bilateral salpingo-oophorectomy Histologic type: Mixed high-grade serous and endometrioid adenocarcinoma Histologic Grade: NA Myometrial invasion:  Present Depth of invasion: 19 mm Myometrial thickness: 20 mm Uterine Serosa Involvement: Not identified Cervical stromal involvement: Present Extent of involvement of other organs: NA Lymphovascular invasion: Not identified Regional Lymph Nodes: Examined: 0 Sentinel 2 Non-sentinel 2 Total Lymph nodes with metastasis: 1 Isolated tumor cells (< 0.2 mm): 0 Micrometastasis: (> 0.2 mm and < 2.0 mm): 1 Macrometastasis: (> 2.0 mm): 0 Extracapsular extension: Not identified Tumor block for ancillary studies: 48F MMR / MSI testing: Pending Pathologic Stage Classification (pTNM, AJCC 8th edition): pT2, pN54m FIGO Stage: IIIC1 Representative Tumor Block: 1A, 1C (v4.1.0.0) Diagnosis Note 1. Immunohistochemical stains for p16, p53, ER and Ki-67 show a pattern of staining consistent with mixed high-grade serous and endometrial adenocarcinoma. Dr. KLyndon Codehas reviewed this case and concurs with the above interpretation. A molecular study for microsatellite instability (MSI) and immunostains for MMR-related proteins are pending and will be reported in an addendum.   02/12/2019 Surgery   Pre-operative Diagnosis: endometrial cancer stage IIIC2, s/p neoadjuvant chemotherapy, extreme obesity (BMI 53kg/m2)  Operation: Robotic-assisted laparoscopic total hysterectomy with bilateral salpingoophorectomy, right pelvic lymphadenectomy. 22 modifier for extreme obesity  Surgeon: RDonaciano Eva Assistant Surgeon: LLahoma CrockerMD  Operative Findings:  : extreme obesity, BMI 53kg/m2, with significant retroperitoneal and intraperitoneal adiposity. Obesity necessitated an additional hour of operating time to create safe exposure. Obesity required additional personnel in the operating room for positioning and retraction. Severe obesity substantially increased the complexity of the procedure. 8cm uterus, grossly normal, normal appearing cervix. Retroperitoneal fibrosis consistent with nodal positivity.  Clinically suspicious right pelvic lymph node.  Unable to completely visualize retroperitoneal nodes due to extreme obesity.    02/12/2019 Pathology Results   IMMUNOHISTOCHEMICAL AND MORPHOMETRIC ANALYSIS PERFORMED MANUALLY The tumor cells are POSITIVE for Her2 (3+). Estrogen Receptor: 70%, POSITIVE, STRONG STAINING INTENSITY Progesterone Receptor: 70%, POSITIVE, STRONG STAINING INTENSITY Proliferation Marker Ki67: 20% REFERENCE RANGE ESTROGEN RECEPTOR NEGATIVE 0% POSITIVE =>1% REFERENCE RANGE PROGESTERONE RECEPTOR NEGATIVE 0% POSITIVE =>1% All controls stained appropriately  1. Uterus +/- tubes/ovaries, neoplastic, cervix, bilateral fallopian tubes and ovaries - MIXED HIGH-GRADE SEROUS AND ENDOMETRIAL ADENOCARCINOMA, 6.5 CM. SEE NOTE - CARCINOMA INVOLVES ENTIRE ENDOMETRIUM, ANTERIOR AND POSTERIOR LOWER UTERINE SEGMENTS AND ANTERIOR AND POSTERIOR CERVIX. - CARCINOMA INVADES FOR A DEPTH OF 1.9 CM WHERE MYOMETRIAL THICKNESS IS 2.0 CM (GREATER THAN 50% MYOMETRIAL INVASION) - CARCINOMA IS 1 MM FROM THE CERVICAL MARGIN - NEGATIVE FOR LYMPHOVASCULAR OR PERINEURAL INVASION - BILATERAL UNREMARKABLE FALLOPIAN TUBES AND OVARIES, NEGATIVE FOR CARCINOMA - SEE ONCOLOGY TABLE 2. Lymph node, biopsy, right pelvic - METASTATIC CARCINOMA TO ONE OF TWO LYMPH NODES (1/2) Microscopic Comment 1. (v4.1.0.0) UTERUS, CARCINOMA OR CARCINOSARCOMA Procedure: Total hysterectomy with bilateral salpingo-oophorectomy Histologic type: Mixed high-grade serous and endometrioid adenocarcinoma Histologic Grade: NA Myometrial invasion: Present Depth of invasion: 19 mm Myometrial thickness: 20 mm Uterine Serosa Involvement: Not identified Cervical stromal involvement: Present Extent of involvement of other organs: NA Lymphovascular invasion: Not identified Regional Lymph Nodes: Examined: 0 Sentinel 2 Non-sentinel 2 Total Lymph nodes with metastasis: 1 Isolated tumor cells (< 0.2 mm): 0 Micrometastasis: (> 0.2 mm  and < 2.0 mm): 1 Macrometastasis: (> 2.0 mm): 0 Extracapsular extension: Not identified Tumor block for ancillary studies: 30F MMR / MSI testing: Pending Pathologic Stage Classification (pTNM, AJCC 8th edition): pT2, pN54m FIGO Stage: IIIC1 Representative Tumor Block: 1A, 1C   03/17/2019 Echocardiogram   1. The left ventricle has normal systolic function, with an ejection fraction of 55-60%. The cavity size was normal. There is moderate concentric left ventricular hypertrophy. Left ventricular diastolic Doppler parameters are consistent with impaired relaxation.  2. The right ventricle has normal systolic function. The cavity was normal. There is no increase in right ventricular wall thickness.  3. GLS: -15.5%, no prior study available for comparison.   03/17/2019 Tumor Marker   Patient's tumor was tested for the following markers: CA-125 Results of the tumor marker test revealed 43   03/20/2019 -  Chemotherapy   The patient had trastuzumab (HERCEPTIN) 693 mg in sodium chloride 0.9 % 250 mL chemo infusion, 8 mg/kg = 693 mg, Intravenous,  Once, 4 of 5 cycles Dose modification: 6 mg/kg (original dose 6 mg/kg, Cycle 2, Reason: Provider Judgment) Administration: 693 mg (03/20/2019), 504 mg (04/10/2019), 504 mg (05/01/2019), 504 mg (05/21/2019)  for chemotherapy treatment.    04/30/2019 Tumor Marker   Patient's tumor was tested for the following markers:CA-125 Results of the tumor marker test revealed 29   05/20/2019 Imaging   1. No evidence of metastatic disease. 2. Small pelvic and left paracolic gutter ascites, new. 3. Aortic atherosclerosis (ICD10-170.0). Coronary artery calcification.       REVIEW OF SYSTEMS:   Constitutional: Denies fevers, chills or abnormal weight loss Eyes: Denies blurriness of vision Ears, nose, mouth, throat, and face: Denies mucositis or sore throat Respiratory: Denies cough, dyspnea or wheezes Cardiovascular: Denies palpitation, chest discomfort or lower extremity  swelling Gastrointestinal:  Denies nausea, heartburn or change in bowel habits Skin: Denies abnormal skin rashes Lymphatics: Denies new lymphadenopathy or easy bruising Neurological:Denies numbness, tingling or new weaknesses Behavioral/Psych: Mood is stable,  no new changes  All other systems were reviewed with the patient and are negative.  I have reviewed the past medical history, past surgical history, social history and family history with the patient and they are unchanged from previous note.  ALLERGIES:  has No Known Allergies.  MEDICATIONS:  Current Outpatient Medications  Medication Sig Dispense Refill  . dexamethasone (DECADRON) 4 MG tablet Take 3 tabs at the night before and 3 tab the morning of chemotherapy, every 3 weeks, by mouth 20 tablet 0  . diphenhydrAMINE (BENADRYL) 25 mg capsule Take 100 mg by mouth 2 (two) times a day.    . gabapentin (NEURONTIN) 400 MG capsule Take 3 capsules (1,200 mg total) by mouth 3 (three) times daily. 270 capsule 1  . hydrochlorothiazide (MICROZIDE) 12.5 MG capsule Take 12.5 mg by mouth daily.  0  . lidocaine-prilocaine (EMLA) cream Apply to affected area once 30 g 3  . losartan (COZAAR) 50 MG tablet Take 50 mg by mouth daily.  2  . ondansetron (ZOFRAN) 8 MG tablet Take 8 mg by mouth every 8 (eight) hours as needed.    Marland Kitchen oxyCODONE-acetaminophen (PERCOCET) 10-325 MG tablet Take 1-2 tablets by mouth every 6 (six) hours as needed for pain.    Marland Kitchen prochlorperazine (COMPAZINE) 10 MG tablet TAKE 1 TABLET BY MOUTH EVERY 6 HOURS AS NEEDED FOR NAUSEA OR VOMITING 30 tablet 1   No current facility-administered medications for this visit.     PHYSICAL EXAMINATION: ECOG PERFORMANCE STATUS: 2 - Symptomatic, <50% confined to bed  Vitals:   05/21/19 1154  BP: (!) 170/81  Pulse: 77  Resp: 18  Temp: 98.3 F (36.8 C)  SpO2: 97%   Filed Weights   05/21/19 1154  Weight: 296 lb 3.2 oz (134.4 kg)    GENERAL:alert, no distress and comfortable SKIN: skin  color, texture, turgor are normal, no rashes or significant lesions EYES: normal, Conjunctiva are pink and non-injected, sclera clear OROPHARYNX:no exudate, no erythema and lips, buccal mucosa, and tongue normal  NECK: supple, thyroid normal size, non-tender, without nodularity LYMPH:  no palpable lymphadenopathy in the cervical, axillary or inguinal LUNGS: clear to auscultation and percussion with normal breathing effort HEART: regular rate & rhythm and no murmurs and no lower extremity edema ABDOMEN:abdomen soft, non-tender and normal bowel sounds Musculoskeletal:no cyanosis of digits and no clubbing  NEURO: alert & oriented x 3 with fluent speech, no focal motor/sensory deficits  LABORATORY DATA:  I have reviewed the data as listed    Component Value Date/Time   NA 142 05/21/2019 1111   K 3.7 05/21/2019 1111   CL 103 05/21/2019 1111   CO2 27 05/21/2019 1111   GLUCOSE 110 (H) 05/21/2019 1111   BUN 13 05/21/2019 1111   CREATININE 0.78 05/21/2019 1111   CALCIUM 9.2 05/21/2019 1111   PROT 7.4 05/21/2019 1111   ALBUMIN 4.0 05/21/2019 1111   AST 19 05/21/2019 1111   ALT 19 05/21/2019 1111   ALKPHOS 110 05/21/2019 1111   BILITOT 0.3 05/21/2019 1111   GFRNONAA >60 05/21/2019 1111   GFRAA >60 05/21/2019 1111    No results found for: SPEP, UPEP  Lab Results  Component Value Date   WBC 3.8 (L) 05/21/2019   NEUTROABS 1.5 (L) 05/21/2019   HGB 10.5 (L) 05/21/2019   HCT 32.4 (L) 05/21/2019   MCV 104.5 (H) 05/21/2019   PLT 103 (L) 05/21/2019      Chemistry      Component Value Date/Time   NA 142  05/21/2019 1111   K 3.7 05/21/2019 1111   CL 103 05/21/2019 1111   CO2 27 05/21/2019 1111   BUN 13 05/21/2019 1111   CREATININE 0.78 05/21/2019 1111      Component Value Date/Time   CALCIUM 9.2 05/21/2019 1111   ALKPHOS 110 05/21/2019 1111   AST 19 05/21/2019 1111   ALT 19 05/21/2019 1111   BILITOT 0.3 05/21/2019 1111       RADIOGRAPHIC STUDIES: I have reviewed CT imaging  with the patient I have personally reviewed the radiological images as listed and agreed with the findings in the report. Ct Abdomen Pelvis W Contrast  Result Date: 05/20/2019 CLINICAL DATA:  Endometrial cancer. Chemotherapy and Herceptin in progress. EXAM: CT ABDOMEN AND PELVIS WITH CONTRAST TECHNIQUE: Multidetector CT imaging of the abdomen and pelvis was performed using the standard protocol following bolus administration of intravenous contrast. CONTRAST:  140m OMNIPAQUE IOHEXOL 300 MG/ML  SOLN COMPARISON:  02/02/2019 FINDINGS: Lower chest: Lung bases are clear. Heart is at the upper limits of normal in size. No pericardial or pleural effusion. Coronary artery calcification. Distal esophagus is grossly unremarkable. Hepatobiliary: Low-attenuation lesions in the left hepatic lobe measure up to 2.2 cm, stable. Cholecystectomy with probable associated biliary ductal dilatation. Pancreas: Negative. Spleen: Negative. Adrenals/Urinary Tract: Adrenal glands and kidneys are unremarkable. Ureters are decompressed. Bladder is low in volume. Stomach/Bowel: Stomach, small bowel and appendix are unremarkable. Apparent cecal wall thickening may be due to underdistention, given a normal appearance on 02/02/2019. Colon is otherwise unremarkable. Vascular/Lymphatic: Atherosclerotic calcification of the aorta without aneurysm. No pathologically enlarged lymph nodes. Reproductive: Hysterectomy.  No adnexal mass. Other: Small pelvic free fluid, new. Probable additional fluid in the left paracolic gutter. Mesenteries and peritoneum are otherwise unremarkable. Musculoskeletal: Degenerative and postoperative changes in the spine. No worrisome lytic or sclerotic lesions. IMPRESSION: 1. No evidence of metastatic disease. 2. Small pelvic and left paracolic gutter ascites, new. 3. Aortic atherosclerosis (ICD10-170.0). Coronary artery calcification. Electronically Signed   By: MLorin PicketM.D.   On: 05/20/2019 14:40    All  questions were answered. The patient knows to call the clinic with any problems, questions or concerns. No barriers to learning was detected.  I spent 25 minutes counseling the patient face to face. The total time spent in the appointment was 30 minutes and more than 50% was on counseling and review of test results  NHeath Lark MD 05/22/2019 3:17 PM

## 2019-05-22 NOTE — Assessment & Plan Note (Signed)
I have reviewed multiple imaging studies with the patient and also discussed the test results with her family over the phone She have no signs of cancer recurrence I suspect the mild ascites seen by the radiologist, which I cannot confirm myself, could be due to residual fluid from her surgery We will continue maintaining Herceptin only every 3 weeks, indefinitely I will schedule echocardiogram next week for follow-up

## 2019-05-22 NOTE — Assessment & Plan Note (Signed)
Her neuropathy has improved with increased dose gabapentin She will continue the same

## 2019-05-22 NOTE — Assessment & Plan Note (Signed)
She has physical deconditioning and history of fall She will continue physical therapy

## 2019-05-22 NOTE — Assessment & Plan Note (Signed)
She has mild persistent pancytopenia, improved since recent dose adjustment We will proceed with treatment without delay

## 2019-05-25 ENCOUNTER — Telehealth: Payer: Self-pay | Admitting: *Deleted

## 2019-05-25 NOTE — Telephone Encounter (Signed)
Telephone call to patient and advised lab results as directed below. Reviewed Echo appt for tomorrow. Patient understands scheduling will call her to make next appts. If she does not hear anything by the end of the week to call this RN back.

## 2019-05-25 NOTE — Telephone Encounter (Signed)
-----   Message from Heath Lark, MD sent at 05/22/2019  9:42 AM EDT ----- Regarding: CA-125 is stable, pls let her know

## 2019-05-26 ENCOUNTER — Ambulatory Visit (HOSPITAL_COMMUNITY)
Admission: RE | Admit: 2019-05-26 | Discharge: 2019-05-26 | Disposition: A | Discharge status: Home / Self Care | Payer: Medicare Other | Source: Ambulatory Visit | Attending: Hematology and Oncology | Admitting: Hematology and Oncology

## 2019-05-26 ENCOUNTER — Other Ambulatory Visit: Payer: Self-pay

## 2019-05-26 DIAGNOSIS — I10 Essential (primary) hypertension: Secondary | ICD-10-CM | POA: Diagnosis not present

## 2019-05-26 DIAGNOSIS — C541 Malignant neoplasm of endometrium: Secondary | ICD-10-CM | POA: Diagnosis not present

## 2019-05-26 DIAGNOSIS — C50919 Malignant neoplasm of unspecified site of unspecified female breast: Secondary | ICD-10-CM | POA: Insufficient documentation

## 2019-05-26 DIAGNOSIS — Z5111 Encounter for antineoplastic chemotherapy: Secondary | ICD-10-CM | POA: Insufficient documentation

## 2019-05-26 NOTE — Progress Notes (Signed)
  Echocardiogram 2D Echocardiogram has been performed.  Kathryn [name] 05/26/2019, 11:53 AM

## 2019-05-27 ENCOUNTER — Telehealth: Payer: Self-pay

## 2019-05-27 NOTE — Telephone Encounter (Signed)
Spoke with pt by phone and gave below msg. No other needs at this time.

## 2019-05-27 NOTE — Telephone Encounter (Signed)
-----   Message from Heath Lark, MD sent at 05/26/2019  4:08 PM EDT ----- Regarding: pls let her know ECHo looks normal

## 2019-05-28 DIAGNOSIS — Z0000 Encounter for general adult medical examination without abnormal findings: Secondary | ICD-10-CM | POA: Diagnosis not present

## 2019-05-28 DIAGNOSIS — I10 Essential (primary) hypertension: Secondary | ICD-10-CM | POA: Diagnosis not present

## 2019-05-28 DIAGNOSIS — Z79899 Other long term (current) drug therapy: Secondary | ICD-10-CM | POA: Diagnosis not present

## 2019-05-29 ENCOUNTER — Encounter

## 2019-06-02 ENCOUNTER — Ambulatory Visit: Payer: Medicare Other

## 2019-06-04 ENCOUNTER — Telehealth: Payer: Self-pay | Admitting: Hematology and Oncology

## 2019-06-04 NOTE — Telephone Encounter (Signed)
I talk with patient regarding schedule  

## 2019-06-10 ENCOUNTER — Other Ambulatory Visit: Payer: Self-pay

## 2019-06-10 ENCOUNTER — Inpatient Hospital Stay: Payer: Medicare Other

## 2019-06-10 ENCOUNTER — Inpatient Hospital Stay (HOSPITAL_BASED_OUTPATIENT_CLINIC_OR_DEPARTMENT_OTHER): Payer: Medicare Other | Admitting: Hematology and Oncology

## 2019-06-10 DIAGNOSIS — C541 Malignant neoplasm of endometrium: Secondary | ICD-10-CM

## 2019-06-10 DIAGNOSIS — D61818 Other pancytopenia: Secondary | ICD-10-CM | POA: Diagnosis not present

## 2019-06-10 DIAGNOSIS — Z79899 Other long term (current) drug therapy: Secondary | ICD-10-CM | POA: Diagnosis not present

## 2019-06-10 DIAGNOSIS — R188 Other ascites: Secondary | ICD-10-CM | POA: Diagnosis not present

## 2019-06-10 DIAGNOSIS — G8929 Other chronic pain: Secondary | ICD-10-CM | POA: Diagnosis not present

## 2019-06-10 DIAGNOSIS — R5381 Other malaise: Secondary | ICD-10-CM | POA: Diagnosis not present

## 2019-06-10 DIAGNOSIS — M549 Dorsalgia, unspecified: Secondary | ICD-10-CM

## 2019-06-10 DIAGNOSIS — Z5112 Encounter for antineoplastic immunotherapy: Secondary | ICD-10-CM | POA: Diagnosis not present

## 2019-06-10 DIAGNOSIS — G629 Polyneuropathy, unspecified: Secondary | ICD-10-CM | POA: Diagnosis not present

## 2019-06-10 LAB — CMP (CANCER CENTER ONLY)
ALT: 17 U/L (ref 0–44)
AST: 16 U/L (ref 15–41)
Albumin: 3.6 g/dL (ref 3.5–5.0)
Alkaline Phosphatase: 105 U/L (ref 38–126)
Anion gap: 13 (ref 5–15)
BUN: 14 mg/dL (ref 8–23)
CO2: 26 mmol/L (ref 22–32)
Calcium: 8.7 mg/dL — ABNORMAL LOW (ref 8.9–10.3)
Chloride: 103 mmol/L (ref 98–111)
Creatinine: 0.78 mg/dL (ref 0.44–1.00)
GFR, Est AFR Am: >60 mL/min (ref >60)
GFR, Estimated: >60 mL/min (ref >60)
Glucose, Bld: 146 mg/dL — ABNORMAL HIGH (ref 70–99)
Potassium: 3.3 mmol/L — ABNORMAL LOW (ref 3.5–5.1)
Sodium: 142 mmol/L (ref 135–145)
Total Bilirubin: 0.3 mg/dL (ref 0.3–1.2)
Total Protein: 6.9 g/dL (ref 6.5–8.1)

## 2019-06-10 LAB — CBC WITH DIFFERENTIAL (CANCER CENTER ONLY)
Abs Immature Granulocytes: 0.01 10*3/uL (ref 0.00–0.07)
Basophils Absolute: 0 10*3/uL (ref 0.0–0.1)
Basophils Relative: 0 %
Eosinophils Absolute: 0 10*3/uL (ref 0.0–0.5)
Eosinophils Relative: 1 %
HCT: 32.2 % — ABNORMAL LOW (ref 36.0–46.0)
Hemoglobin: 10.5 g/dL — ABNORMAL LOW (ref 12.0–15.0)
Immature Granulocytes: 0 %
Lymphocytes Relative: 33 %
Lymphs Abs: 1.3 10*3/uL (ref 0.7–4.0)
MCH: 34.1 pg — ABNORMAL HIGH (ref 26.0–34.0)
MCHC: 32.6 g/dL (ref 30.0–36.0)
MCV: 104.5 fL — ABNORMAL HIGH (ref 80.0–100.0)
Monocytes Absolute: 0.4 10*3/uL (ref 0.1–1.0)
Monocytes Relative: 11 %
Neutro Abs: 2.2 10*3/uL (ref 1.7–7.7)
Neutrophils Relative %: 55 %
Platelet Count: 150 10*3/uL (ref 150–400)
RBC: 3.08 MIL/uL — ABNORMAL LOW (ref 3.87–5.11)
RDW: 17.8 % — ABNORMAL HIGH (ref 11.5–15.5)
WBC Count: 3.9 10*3/uL — ABNORMAL LOW (ref 4.0–10.5)
nRBC: 0 % (ref 0.0–0.2)

## 2019-06-10 MED ORDER — SODIUM CHLORIDE 0.9% FLUSH
10.0000 mL | INTRAVENOUS | Status: DC | PRN
  Administered 2019-06-10: 10 mL
  Filled 2019-06-10: qty 10

## 2019-06-10 MED ORDER — SODIUM CHLORIDE 0.9 % IV SOLN
Freq: Once | INTRAVENOUS | Status: AC
  Administered 2019-06-10: 09:00:00 via INTRAVENOUS
  Filled 2019-06-10: qty 250

## 2019-06-10 MED ORDER — HEPARIN SOD (PORK) LOCK FLUSH 100 UNIT/ML IV SOLN
500.0000 [IU] | Freq: Once | INTRAVENOUS | Status: AC | PRN
  Administered 2019-06-10: 500 [IU]
  Filled 2019-06-10: qty 5

## 2019-06-10 MED ORDER — ACETAMINOPHEN 325 MG PO TABS
ORAL_TABLET | ORAL | Status: AC
  Filled 2019-06-10: qty 2

## 2019-06-10 MED ORDER — DIPHENHYDRAMINE HCL 25 MG PO CAPS: 25.0000 mg | ORAL_CAPSULE | Freq: Once | ORAL | Status: DC

## 2019-06-10 MED ORDER — ACETAMINOPHEN 325 MG PO TABS
650.0000 mg | ORAL_TABLET | Freq: Once | ORAL | Status: AC
  Administered 2019-06-10: 650 mg via ORAL

## 2019-06-10 MED ORDER — TRASTUZUMAB CHEMO 150 MG IV SOLR
6.0000 mg/kg | Freq: Once | INTRAVENOUS | Status: AC
  Administered 2019-06-10: 504 mg via INTRAVENOUS
  Filled 2019-06-10: qty 24

## 2019-06-10 MED ORDER — SODIUM CHLORIDE 0.9% FLUSH
10.0000 mL | Freq: Once | INTRAVENOUS | Status: AC
  Administered 2019-06-10: 10 mL
  Filled 2019-06-10: qty 10

## 2019-06-10 NOTE — Patient Instructions (Addendum)
[facility] Discharge Instructions for Patients Receiving Chemotherapy  Today you received the following chemotherapy agent: Herceptin  To help prevent nausea and vomiting after your treatment, we encourage you to take your nausea medication as directed.    If you develop nausea and vomiting that is not controlled by your nausea medication, call the clinic.   BELOW ARE SYMPTOMS THAT SHOULD BE REPORTED IMMEDIATELY:  *FEVER GREATER THAN 100.5 F  *CHILLS WITH OR WITHOUT FEVER  NAUSEA AND VOMITING THAT IS NOT CONTROLLED WITH YOUR NAUSEA MEDICATION  *UNUSUAL SHORTNESS OF BREATH  *UNUSUAL BRUISING OR BLEEDING  TENDERNESS IN MOUTH AND THROAT WITH OR WITHOUT PRESENCE OF ULCERS  *URINARY PROBLEMS  *BOWEL PROBLEMS  UNUSUAL RASH Items with * indicate a potential emergency and should be followed up as soon as possible.  Feel free to call the clinic should you have any questions or concerns. The clinic phone number is [phone].  Please show the [place] at check-in to the Emergency Department and triage nurse.    Hypokalemia Low potassium Hypokalemia means that the amount of potassium in the blood is lower than normal. Potassium is a chemical (electrolyte) that helps regulate the amount of fluid in the body. It also stimulates muscle tightening (contraction) and helps nerves work properly. Normally, most of the body's potassium is inside cells, and only a very small amount is in the blood. Because the amount in the blood is so small, minor changes to potassium levels in the blood can be life-threatening. What are the causes? This condition may be caused by:  Antibiotic medicine.  Diarrhea or vomiting. Taking too much of a medicine that helps you have a bowel movement (laxative) can cause diarrhea and lead to hypokalemia.  Chronic kidney disease (CKD).  Medicines that help the body get rid of excess fluid (diuretics).  Eating disorders, such as  bulimia.  Low magnesium levels in the body.  Sweating a lot. What are the signs or symptoms? Symptoms of this condition include:  Weakness.  Constipation.  Fatigue.  Muscle cramps.  Mental confusion.  Skipped heartbeats or irregular heartbeat (palpitations).  Tingling or numbness. How is this diagnosed? This condition is diagnosed with a blood test. How is this treated? This condition may be treated by:  Taking potassium supplements by mouth.  Adjusting the medicines that you take.  Eating more foods that contain a lot of potassium. If your potassium level is very low, you may need to get potassium through an IV and be monitored in the hospital. Follow these instructions at home:   Take over-the-counter and prescription medicines only as told by your health care provider. This includes vitamins and supplements.  Eat a healthy diet. A healthy diet includes fresh fruits and vegetables, whole grains, healthy fats, and lean proteins.  If instructed, eat more foods that contain a lot of potassium. This includes: ? Nuts, such as peanuts and pistachios. ? Seeds, such as sunflower seeds and pumpkin seeds. ? Peas, lentils, and lima beans. ? Whole grain and bran cereals and breads. ? Fresh fruits and vegetables, such as apricots, avocado, bananas, cantaloupe, kiwi, oranges, tomatoes, asparagus, and potatoes. ? Orange juice. ? Tomato juice. ? Red meats. ? Yogurt.  Keep all follow-up visits as told by your health care provider. This is important. Contact a health care provider if you:  Have weakness that gets worse.  Feel your heart pounding or racing.  Vomit.  Have diarrhea.  Have diabetes (diabetes mellitus) and you  have trouble keeping your blood sugar (glucose) in your target range. Get help right away if you:  Have chest pain.  Have shortness of breath.  Have vomiting or diarrhea that lasts for more than 2 days.  Faint. Summary  Hypokalemia means that  the amount of potassium in the blood is lower than normal.  This condition is diagnosed with a blood test.  Hypokalemia may be treated by taking potassium supplements, adjusting the medicines that you take, or eating more foods that are high in potassium.  If your potassium level is very low, you may need to get potassium through an IV and be monitored in the hospital. This information is not intended to replace advice given to you by your health care provider. Make sure you discuss any questions you have with your health care provider. Document Released: 10/01/2005 Document Revised: 05/14/2018 Document Reviewed: 05/14/2018 Elsevier Patient Education  2020 [employer].

## 2019-06-11 ENCOUNTER — Telehealth: Payer: Self-pay | Admitting: Hematology and Oncology

## 2019-06-11 ENCOUNTER — Encounter: Payer: Self-pay | Admitting: Hematology and Oncology

## 2019-06-11 NOTE — Assessment & Plan Note (Signed)
She has mild pancytopenia but much improved since discontinuation of chemotherapy She is not symptomatic Observe only

## 2019-06-11 NOTE — Progress Notes (Signed)
[place] OFFICE PROGRESS NOTE  Patient Care Team: Ronita Hipps, MD as PCP - General (Family Medicine) Nicholaus Bloom, MD (Anesthesiology)  ASSESSMENT & PLAN:  Endometrial cancer [facility]) So far, she tolerated Herceptin well We will continue treatment for few more months before repeating imaging study Her next echocardiogram is due in November She will continue supportive care  Pancytopenia, acquired [facility]) She has mild pancytopenia but much improved since discontinuation of chemotherapy She is not symptomatic Observe only  Chronic back pain greater than 3 months duration Her back pain had recently flareup due to her activity She will continue analgesics as prescribed by her pain medicine specialist and avoid excessive physical activity   No orders of the defined types were placed in this encounter.   INTERVAL HISTORY: Please see below for problem oriented charting. 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 A Pap was done 09/02/18 showign ASC-H. She was referred onto GYN and seen by Dr. Paula Compton    09/09/2018 Procedure   She underwent endometrial biopsy   09/12/2018 Pathology Results   Endometrial biopsy positive for adenocarcinoma   09/22/2018 Imaging   Ct abdomen and pelvis showed: Diffuse endometrial thickening, consistent with primary endometrial carcinoma.  Pelvic and abdominal retroperitoneal and retrocrural lymphadenopathy, consistent with metastatic disease.  Moderate hepatic steatosis.   09/25/2018  Cancer Staging   Staging form: Corpus Uteri - Carcinoma and Carcinosarcoma, AJCC 8th Edition - Pathologic: Stage IIIC2 (pT2, pN2, cM0) - Signed by Heath Lark, MD on 02/23/2019   10/01/2018 Procedure   Successful placement of a right IJ approach Power Port with ultrasound and fluoroscopic guidance. The catheter is ready for use.   10/02/2018 Tumor Marker   Patient's tumor was tested for the following markers: CA-125 Results of the tumor marker test revealed 578   10/03/2018 - 01/16/2019 Chemotherapy   The patient had carboplatin and Taxol with dose adjustment due to neuropathy x 6 cycles   10/24/2018 Tumor Marker   Patient's tumor was tested for the following markers: CA-125 Results of the tumor marker test revealed 296    Genetic Testing   Patient has genetic testing done for MSI/MMR. Results revealed MSI stable and MMR normal on Accession [id number].   11/14/2018 Tumor Marker   Patient's tumor was tested for the following markers: CA-125 Results of the tumor marker test revealed 81   11/27/2018 Imaging   1. Significant decrease in abdominal retroperitoneal and bilateral iliac lymphadenopathy since previous study. 2. Significant decrease in abnormal endometrial soft tissue density since prior study. 3. No new or progressive metastatic disease identified.   12/05/2018 Tumor Marker   Patient's tumor was tested for the following markers: CA-125 Results of the tumor marker test revealed 46.3   12/26/2018 Tumor Marker   Patient's tumor was tested for the following markers: CA-125 Results of the tumor marker test revealed 38.2   01/16/2019 Tumor Marker   Patient's tumor was tested for the following markers: CA-125 Results of the tumor marker test revealed 30.8   02/03/2019 Imaging   1. Numerous retroperitoneal, iliac,  and pelvic lymph nodes are stable or slightly decreased in size compared to immediate prior examination dated 11/27/2018 and significantly decreased in size compared to  exam dated 09/20/2018.  2.  Unchanged thickening of the endometrium.  3. Other chronic, incidental, and postoperative findings as detailed above.   02/12/2019 Imaging   1. Uterus +/- tubes/ovaries, neoplastic, cervix, bilateral fallopian tubes and ovaries - MIXED HIGH-GRADE SEROUS AND ENDOMETRIAL ADENOCARCINOMA, 6.5 CM. SEE NOTE - CARCINOMA INVOLVES ENTIRE ENDOMETRIUM, ANTERIOR AND POSTERIOR LOWER UTERINE SEGMENTS AND ANTERIOR AND POSTERIOR CERVIX. - CARCINOMA INVADES FOR A DEPTH OF 1.9 CM WHERE MYOMETRIAL THICKNESS IS 2.0 CM (GREATER THAN 50% MYOMETRIAL INVASION) - CARCINOMA IS 1 MM FROM THE CERVICAL MARGIN - NEGATIVE FOR LYMPHOVASCULAR OR PERINEURAL INVASION - BILATERAL UNREMARKABLE FALLOPIAN TUBES AND OVARIES, NEGATIVE FOR CARCINOMA - SEE ONCOLOGY TABLE 2. Lymph node, biopsy, right pelvic - METASTATIC CARCINOMA TO ONE OF TWO LYMPH NODES (1/2) Microscopic Comment 1. (v4.1.0.0) UTERUS, CARCINOMA OR CARCINOSARCOMA Procedure: Total hysterectomy with bilateral salpingo-oophorectomy Histologic type: Mixed high-grade serous and endometrioid adenocarcinoma Histologic Grade: NA Myometrial invasion: Present Depth of invasion: 19 mm Myometrial thickness: 20 mm Uterine Serosa Involvement: Not identified Cervical stromal involvement: Present Extent of involvement of other organs: NA Lymphovascular invasion: Not identified Regional Lymph Nodes: Examined: 0 Sentinel 2 Non-sentinel 2 Total Lymph nodes with metastasis: 1 Isolated tumor cells (< 0.2 mm): 0 Micrometastasis: (> 0.2 mm and < 2.0 mm): 1 Macrometastasis: (> 2.0 mm): 0 Extracapsular extension: Not identified Tumor block for ancillary studies: 47F MMR / MSI testing: Pending Pathologic Stage Classification (pTNM, AJCC 8th edition): pT2, pN81m FIGO Stage: IIIC1 Representative Tumor Block: 1A, 1C (v4.1.0.0) Diagnosis Note 1. Immunohistochemical stains for p16, p53, ER and Ki-67 show a pattern of staining consistent with mixed  high-grade serous and endometrial adenocarcinoma. Dr. KLyndon Codehas reviewed this case and concurs with the above interpretation. A molecular study for microsatellite instability (MSI) and immunostains for MMR-related proteins are pending and will be reported in an addendum.   02/12/2019 Surgery   Pre-operative Diagnosis: endometrial cancer stage IIIC2, s/p neoadjuvant chemotherapy, extreme obesity (BMI 53kg/m2)  Operation: Robotic-assisted laparoscopic total hysterectomy with bilateral salpingoophorectomy, right pelvic lymphadenectomy. 22 modifier for extreme obesity  Surgeon: RDonaciano Eva Assistant Surgeon: LLahoma CrockerMD  Operative Findings:  : extreme obesity, BMI 53kg/m2, with significant retroperitoneal and intraperitoneal adiposity. Obesity necessitated an additional hour of operating time to create safe exposure. Obesity required additional personnel in the operating room for positioning and retraction. Severe obesity substantially increased the complexity of the procedure. 8cm uterus, grossly normal, normal appearing cervix. Retroperitoneal fibrosis consistent with nodal positivity. Clinically suspicious right pelvic lymph node. Unable to completely visualize retroperitoneal nodes due to extreme obesity.    02/12/2019 Pathology Results   IMMUNOHISTOCHEMICAL AND MORPHOMETRIC ANALYSIS PERFORMED MANUALLY The tumor cells are POSITIVE for Her2 (3+). Estrogen Receptor: 70%, POSITIVE, STRONG STAINING INTENSITY Progesterone Receptor: 70%, POSITIVE, STRONG STAINING INTENSITY Proliferation Marker Ki67: 20% REFERENCE RANGE ESTROGEN RECEPTOR NEGATIVE 0% POSITIVE =>1% REFERENCE RANGE PROGESTERONE RECEPTOR NEGATIVE 0% POSITIVE =>1% All controls stained appropriately  1. Uterus +/- tubes/ovaries, neoplastic, cervix, bilateral fallopian tubes and ovaries - MIXED HIGH-GRADE SEROUS AND ENDOMETRIAL ADENOCARCINOMA, 6.5 CM. SEE NOTE - CARCINOMA INVOLVES ENTIRE ENDOMETRIUM, ANTERIOR AND  POSTERIOR LOWER UTERINE SEGMENTS AND ANTERIOR AND POSTERIOR CERVIX. - CARCINOMA INVADES FOR A DEPTH OF 1.9 CM WHERE MYOMETRIAL THICKNESS IS 2.0 CM (GREATER THAN 50% MYOMETRIAL INVASION) - CARCINOMA IS 1 MM FROM THE CERVICAL MARGIN - NEGATIVE FOR LYMPHOVASCULAR  OR PERINEURAL INVASION - BILATERAL UNREMARKABLE FALLOPIAN TUBES AND OVARIES, NEGATIVE FOR CARCINOMA - SEE ONCOLOGY TABLE 2. Lymph node, biopsy, right pelvic - METASTATIC CARCINOMA TO ONE OF TWO LYMPH NODES (1/2) Microscopic Comment 1. (v4.1.0.0) UTERUS, CARCINOMA OR CARCINOSARCOMA Procedure: Total hysterectomy with bilateral salpingo-oophorectomy Histologic type: Mixed high-grade serous and endometrioid adenocarcinoma Histologic Grade: NA Myometrial invasion: Present Depth of invasion: 19 mm Myometrial thickness: 20 mm Uterine Serosa Involvement: Not identified Cervical stromal involvement: Present Extent of involvement of other organs: NA Lymphovascular invasion: Not identified Regional Lymph Nodes: Examined: 0 Sentinel 2 Non-sentinel 2 Total Lymph nodes with metastasis: 1 Isolated tumor cells (< 0.2 mm): 0 Micrometastasis: (> 0.2 mm and < 2.0 mm): 1 Macrometastasis: (> 2.0 mm): 0 Extracapsular extension: Not identified Tumor block for ancillary studies: 74F MMR / MSI testing: Pending Pathologic Stage Classification (pTNM, AJCC 8th edition): pT2, pN28m FIGO Stage: IIIC1 Representative Tumor Block: 1A, 1C   03/17/2019 Echocardiogram   1. The left ventricle has normal systolic function, with an ejection fraction of 55-60%. The cavity size was normal. There is moderate concentric left ventricular hypertrophy. Left ventricular diastolic Doppler parameters are consistent with impaired relaxation.  2. The right ventricle has normal systolic function. The cavity was normal. There is no increase in right ventricular wall thickness.  3. GLS: -15.5%, no prior study available for comparison.   03/17/2019 Tumor Marker   Patient's tumor  was tested for the following markers: CA-125 Results of the tumor marker test revealed 43   03/20/2019 -  Chemotherapy   The patient had trastuzumab (HERCEPTIN) 693 mg in sodium chloride 0.9 % 250 mL chemo infusion, 8 mg/kg = 693 mg, Intravenous,  Once, 5 of 8 cycles Dose modification: 6 mg/kg (original dose 6 mg/kg, Cycle 2, Reason: Provider Judgment) Administration: 693 mg (03/20/2019), 504 mg (04/10/2019), 504 mg (05/01/2019), 504 mg (05/21/2019), 504 mg (06/10/2019)  for chemotherapy treatment.    04/30/2019 Tumor Marker   Patient's tumor was tested for the following markers:CA-125 Results of the tumor marker test revealed 29   05/20/2019 Imaging   1. No evidence of metastatic disease. 2. Small pelvic and left paracolic gutter ascites, new. 3. Aortic atherosclerosis (ICD10-170.0). Coronary artery calcification.     05/26/2019 Echocardiogram      1. The left ventricle has normal systolic function with an ejection fraction of 60-65%. The cavity size was normal. There is mildly increased left ventricular wall thickness. Left ventricular diastolic parameters were normal.  2. GLS -16.4% (underestimated due to poor endocardial tracking).  3. The right ventricle has normal systolic function. The cavity was normal. There is no increase in right ventricular wall thickness.  4. Mild calcification of the mitral valve leaflet.  5. Mild calcification of the aortic valve. No stenosis of the aortic valve.  6. The aorta is normal in size and structure.     REVIEW OF SYSTEMS:   Constitutional: Denies fevers, chills or abnormal weight loss Eyes: Denies blurriness of vision Ears, nose, mouth, throat, and face: Denies mucositis or sore throat Respiratory: Denies cough, dyspnea or wheezes Cardiovascular: Denies palpitation, chest discomfort or lower extremity swelling Gastrointestinal:  Denies nausea, heartburn or change in bowel habits Skin: Denies abnormal skin rashes Lymphatics: Denies new lymphadenopathy  or easy bruising Neurological:Denies numbness, tingling or new weaknesses Behavioral/Psych: Mood is stable, no new changes  All other systems were reviewed with the patient and are negative.  I have reviewed the past medical history, past surgical history, social history and family history with  the patient and they are unchanged from previous note.  ALLERGIES:  has No Known Allergies.  MEDICATIONS:  Current Outpatient Medications  Medication Sig Dispense Refill  . losartan-hydrochlorothiazide (HYZAAR) 50-12.5 MG tablet Take 2 tablets by mouth daily.    . diphenhydrAMINE (BENADRYL) 25 mg capsule Take 100 mg by mouth 2 (two) times a day.    . gabapentin (NEURONTIN) 400 MG capsule Take 3 capsules (1,200 mg total) by mouth 3 (three) times daily. 270 capsule 1  . lidocaine-prilocaine (EMLA) cream Apply to affected area once 30 g 3  . ondansetron (ZOFRAN) 8 MG tablet Take 8 mg by mouth every 8 (eight) hours as needed.    Marland Kitchen oxyCODONE-acetaminophen (PERCOCET) 10-325 MG tablet Take 1-2 tablets by mouth every 6 (six) hours as needed for pain.    Marland Kitchen prochlorperazine (COMPAZINE) 10 MG tablet TAKE 1 TABLET BY MOUTH EVERY 6 HOURS AS NEEDED FOR NAUSEA OR VOMITING 30 tablet 1   No current facility-administered medications for this visit.     PHYSICAL EXAMINATION: ECOG PERFORMANCE STATUS: 1 - Symptomatic but completely ambulatory  Vitals:   06/10/19 0826  BP: (!) 153/68  Pulse: 76  Resp: 18  Temp: 98 F (36.7 C)  SpO2: 98%   Filed Weights   06/10/19 0826  Weight: 298 lb 12.8 oz (135.5 kg)    GENERAL:alert, no distress and comfortable SKIN: skin color, texture, turgor are normal, no rashes or significant lesions EYES: normal, Conjunctiva are pink and non-injected, sclera clear OROPHARYNX:no exudate, no erythema and lips, buccal mucosa, and tongue normal  NECK: supple, thyroid normal size, non-tender, without nodularity LYMPH:  no palpable lymphadenopathy in the cervical, axillary or  inguinal LUNGS: clear to auscultation and percussion with normal breathing effort HEART: regular rate & rhythm and no murmurs and no lower extremity edema ABDOMEN:abdomen soft, non-tender and normal bowel sounds Musculoskeletal:no cyanosis of digits and no clubbing  NEURO: alert & oriented x 3 with fluent speech, no focal motor/sensory deficits  LABORATORY DATA:  I have reviewed the data as listed    Component Value Date/Time   NA 142 06/10/2019 0802   K 3.3 (L) 06/10/2019 0802   CL 103 06/10/2019 0802   CO2 26 06/10/2019 0802   GLUCOSE 146 (H) 06/10/2019 0802   BUN 14 06/10/2019 0802   CREATININE 0.78 06/10/2019 0802   CALCIUM 8.7 (L) 06/10/2019 0802   PROT 6.9 06/10/2019 0802   ALBUMIN 3.6 06/10/2019 0802   AST 16 06/10/2019 0802   ALT 17 06/10/2019 0802   ALKPHOS 105 06/10/2019 0802   BILITOT 0.3 06/10/2019 0802   GFRNONAA >60 06/10/2019 0802   GFRAA >60 06/10/2019 0802    No results found for: SPEP, UPEP  Lab Results  Component Value Date   WBC 3.9 (L) 06/10/2019   NEUTROABS 2.2 06/10/2019   HGB 10.5 (L) 06/10/2019   HCT 32.2 (L) 06/10/2019   MCV 104.5 (H) 06/10/2019   PLT 150 06/10/2019      Chemistry      Component Value Date/Time   NA 142 06/10/2019 0802   K 3.3 (L) 06/10/2019 0802   CL 103 06/10/2019 0802   CO2 26 06/10/2019 0802   BUN 14 06/10/2019 0802   CREATININE 0.78 06/10/2019 0802      Component Value Date/Time   CALCIUM 8.7 (L) 06/10/2019 0802   ALKPHOS 105 06/10/2019 0802   AST 16 06/10/2019 0802   ALT 17 06/10/2019 0802   BILITOT 0.3 06/10/2019 0802  RADIOGRAPHIC STUDIES: I have personally reviewed the radiological images as listed and agreed with the findings in the report. Ct Abdomen Pelvis W Contrast  Result Date: 05/20/2019 CLINICAL DATA:  Endometrial cancer. Chemotherapy and Herceptin in progress. EXAM: CT ABDOMEN AND PELVIS WITH CONTRAST TECHNIQUE: Multidetector CT imaging of the abdomen and pelvis was performed using the  standard protocol following bolus administration of intravenous contrast. CONTRAST:  182m OMNIPAQUE IOHEXOL 300 MG/ML  SOLN COMPARISON:  02/02/2019 FINDINGS: Lower chest: Lung bases are clear. Heart is at the upper limits of normal in size. No pericardial or pleural effusion. Coronary artery calcification. Distal esophagus is grossly unremarkable. Hepatobiliary: Low-attenuation lesions in the left hepatic lobe measure up to 2.2 cm, stable. Cholecystectomy with probable associated biliary ductal dilatation. Pancreas: Negative. Spleen: Negative. Adrenals/Urinary Tract: Adrenal glands and kidneys are unremarkable. Ureters are decompressed. Bladder is low in volume. Stomach/Bowel: Stomach, small bowel and appendix are unremarkable. Apparent cecal wall thickening may be due to underdistention, given a normal appearance on 02/02/2019. Colon is otherwise unremarkable. Vascular/Lymphatic: Atherosclerotic calcification of the aorta without aneurysm. No pathologically enlarged lymph nodes. Reproductive: Hysterectomy.  No adnexal mass. Other: Small pelvic free fluid, new. Probable additional fluid in the left paracolic gutter. Mesenteries and peritoneum are otherwise unremarkable. Musculoskeletal: Degenerative and postoperative changes in the spine. No worrisome lytic or sclerotic lesions. IMPRESSION: 1. No evidence of metastatic disease. 2. Small pelvic and left paracolic gutter ascites, new. 3. Aortic atherosclerosis (ICD10-170.0). Coronary artery calcification. Electronically Signed   By: MLorin PicketM.D.   On: 05/20/2019 14:40    All questions were answered. The patient knows to call the clinic with any problems, questions or concerns. No barriers to learning was detected.  I spent 15 minutes counseling the patient face to face. The total time spent in the appointment was 20 minutes and more than 50% was on counseling and review of test results  NHeath Lark MD 06/11/2019 9:10 AM

## 2019-06-11 NOTE — Assessment & Plan Note (Signed)
So far, she tolerated Herceptin well We will continue treatment for few more months before repeating imaging study Her next echocardiogram is due in November She will continue supportive care

## 2019-06-11 NOTE — Telephone Encounter (Signed)
I talk with patient regarding schedule  

## 2019-06-11 NOTE — Assessment & Plan Note (Signed)
Her back pain had recently flareup due to her activity She will continue analgesics as prescribed by her pain medicine specialist and avoid excessive physical activity

## 2019-06-16 DIAGNOSIS — G894 Chronic pain syndrome: Secondary | ICD-10-CM | POA: Diagnosis not present

## 2019-06-16 DIAGNOSIS — M961 Postlaminectomy syndrome, not elsewhere classified: Secondary | ICD-10-CM | POA: Diagnosis not present

## 2019-06-16 DIAGNOSIS — Z79891 Long term (current) use of opiate analgesic: Secondary | ICD-10-CM | POA: Diagnosis not present

## 2019-06-16 DIAGNOSIS — M5416 Radiculopathy, lumbar region: Secondary | ICD-10-CM | POA: Diagnosis not present

## 2019-06-25 NOTE — Progress Notes (Signed)
The following biosimilar Kanjinti (trastuzumab-anns) has been selected for use in this patient.  Demetrius Charity, PharmD, [facility]acy Phone: [phone] 06/25/2019

## 2019-06-30 ENCOUNTER — Other Ambulatory Visit: Payer: Self-pay | Admitting: Hematology and Oncology

## 2019-06-30 ENCOUNTER — Ambulatory Visit: Payer: Medicare Other | Attending: Hematology and Oncology

## 2019-06-30 ENCOUNTER — Other Ambulatory Visit: Payer: Self-pay

## 2019-06-30 DIAGNOSIS — M5442 Lumbago with sciatica, left side: Secondary | ICD-10-CM | POA: Diagnosis not present

## 2019-06-30 DIAGNOSIS — G8929 Other chronic pain: Secondary | ICD-10-CM

## 2019-06-30 DIAGNOSIS — M6281 Muscle weakness (generalized): Secondary | ICD-10-CM

## 2019-06-30 DIAGNOSIS — M5441 Lumbago with sciatica, right side: Secondary | ICD-10-CM | POA: Insufficient documentation

## 2019-06-30 DIAGNOSIS — R262 Difficulty in walking, not elsewhere classified: Secondary | ICD-10-CM | POA: Diagnosis not present

## 2019-06-30 NOTE — Therapy (Addendum)
[place], [place], [address] Phone: [phone]   Fax:  [phone]  Physical Therapy Treatment  Patient Details  Name: Kathryn [name] MRN: [id number] Date of Birth: 08/12/1951 Referring Provider (PT): Dr. Alvy Bimler    Encounter Date: 06/30/2019  PT End of Session - 06/30/19 1042    Visit Number  13    Number of Visits  20    Date for PT Re-Evaluation  06/30/19    PT Start Time  5997    PT Stop Time  1045    PT Time Calculation (min)  43 min    Activity Tolerance  Patient tolerated treatment well    Behavior During Therapy  [facility] for tasks assessed/performed       Past Medical History:  Diagnosis Date  . Arthritis    Knees and ankles fussed rt ankle  . Dyspnea    with excertion  . Hypertension   . Neuromuscular disorder ([place]) 1992   pinched nerve after back surgery numbness Lt leg lateral and last tow toes.  . stage IV uterine serous ca dx'd 08/2018    Past Surgical History:  Procedure Laterality Date  . ANKLE SURGERY Right    right fusion  . BACK SURGERY     5 surgeries at [facility]; has screws and a "cage"  . CARPAL TUNNEL RELEASE Bilateral   . COLONOSCOPY    . IR IMAGING GUIDED PORT INSERTION  10/01/2018  . KNEE SURGERY Left   . LAPAROSCOPIC CHOLECYSTECTOMY    . LUMBAR LAMINECTOMY/DECOMPRESSION MICRODISCECTOMY Left 08/06/2013   Procedure: LUMBAR LAMINECTOMY/DECOMPRESSION MICRODISCECTOMY 1 LEVEL;  Surgeon: Ophelia Charter, MD;  Location: [facility];  Service: Neurosurgery;  Laterality: Left;  redo LEFT Lumbar Five-Sacral One diskectomy  . LYMPH NODE BIOPSY N/A 02/12/2019   Procedure: RIGHT PELVIC LYMPH NODE DISSECTION;  Surgeon: Everitt Amber, MD;  Location: [facility];  Service: Gynecology;  Laterality: N/A;  . PLACEMENT OF LUMBAR DRAIN N/A 08/07/2013   Procedure: PLACEMENT OF LUMBAR DRAIN;  Surgeon: Ophelia Charter, MD;  Location: [facility];  Service: Neurosurgery;  Laterality: N/A;  .  ROBOTIC ASSISTED TOTAL HYSTERECTOMY WITH BILATERAL SALPINGO OOPHERECTOMY Bilateral 02/12/2019   Procedure: XI ROBOTIC ASSISTED TOTAL HYSTERECTOMY WITH BILATERAL SALPINGO OOPHORECTOMY;  Surgeon: Everitt Amber, MD;  Location: [facility];  Service: Gynecology;  Laterality: Bilateral;  . TONSILLECTOMY    . TUBAL LIGATION  1982    There were no vitals filed for this visit.  Subjective Assessment - 06/30/19 1007    Subjective  I haven't been here due to increased back pain and fatigue partially from my chemo treatments and partially from getting my house ready to sell so I can move in with my daughter in her guest house. My doctor put me on a round of steroids (prednisone) abput 2 weeks ago and that helped to decrease my back pain and radiculopathy some.    Pertinent History  Endometrial cancer diagnosed September 01, 2018  First  chemotherapy Dec. 20  and will have 3 treatments , one every 3 weeks, then retesting and possibly surgery and then 3 more chemos. but that schedule may change  past history includes 6 back surgeries from L1 down starting in 1974 due to degenerative disc disease from the neck down She had an MRI in May of 2018 and was told there were so many areas of compression that it was not able to tell where leg pain was coming from.  She is followed  by Dr. Koleen Nimrod at [facility] and she has epidurals but is not able have them anymore because there is no space for  the needle.    Patient Stated Goals  to get back in the water again and go to the grocery store and not be out of breath    Currently in Pain?  Yes    Pain Score  5     Pain Location  Back    Pain Orientation  Right;Lower    Pain Descriptors / Indicators  Aching;Shooting    Pain Type  Neuropathic pain    Pain Radiating Towards  to Rt hip    Pain Onset  More than a month ago    Pain Frequency  Constant    Aggravating Factors   sitting for long periods, but also prolonged standing    Pain Relieving Factors  prednisone helped  some         [facility] PT Assessment - 06/30/19 0001      Timed Up and Go Test   Normal TUG (seconds)  13   no cane                  [facility] Adult PT Treatment/Exercise - 06/30/19 0001      Self-Care   Other Self-Care Comments   Spent majority of session reviewing with pt and encouraging her bext ways to incorporate HEP into daily life taking into account to be mindful of when she has increased fatigue following chemo treatments, also to remember to pace herself with preparing her house to move into daughters guest home. Also educated her on circuit type exs as another option for exercising. Pt verbalized understanding all.       Knee/Hip Exercises: Aerobic   Nustep  Level 4, x 12 mins with PTA present to monitor pt throughout and discuss current functional level               PT Short Term Goals - 10/16/18 1549      PT SHORT TERM GOAL #1   Title  Pt will report she is able to do a home exercise program and document her progress at home     Time  4    Period  Weeks    Status  New        PT Long Term Goals - 06/30/19 1027      PT LONG TERM GOAL #1   Title  Pt will decrease her time for Normal TUG to < 10 seconds indicating an improvment in balance and gait    Baseline  12.76; 12.25-2/19/20; did not retest today but pt reports sit-stand easier now as her legs are feeling stronger-12/17/18, 03/19/19- 16.1 seconds; 13.76 sec with no cane - 05/19/19; 13 sec without cane - 06/30/19    Status  Not Met      PT LONG TERM GOAL #2   Title  Pt with increase # of reps of sit to stand in 30 seconds to < 13 indicating and increase in general strength    Baseline  11; 11 - 12/03/18; did not retest otday but pt is demonstrating greater ease with sit-stand multiple reps during session-12/17/18, 03/19/19- 9 reps pt very short of breath after this; 11 reps and very minimal SOB after today - 05/19/19; 12 reps without SOB and no UE support - 06/30/19    Status  Partially Met      PT LONG TERM  GOAL #3   Title  Pt  will report she is able to climb steps with 25% less difficulty so that she can get in and out of the pool for water exercise     Baseline  Pt still struggling with stairs so hasn't been back to pool-12/03/18; pt reports though she hasn't been back to pool yet her arms and legs are feeling stronger with all ADLs-3/4/2, 03/19/19- pt reports she is still having a lot of difficulty and has not really used the steps at all; doing steps pt was able to alt steps with +1-2 handrails up and down and reports 50-60% greater ease with this since start of care - 05/19/19; still 50-60% improved at this time with +1-2 handrals (unable to try pool yet due to [place])    Status  Achieved            Plan - 06/30/19 1045    Clinical Impression Statement  Pt has met 1/3 goals at this time and made good progress towards remaining goals. At this time she is agreeable to D/C as it has been harder for her to come due to increased back pain and is working to get her house ready to sell which increases her daily fatigue. Verbally reviewed HEP plan with pt and how to best space out exercises like doing circuit training with just 3-4 exs at a time or doing multiple sets throughout the day. Pt is agreeable to all and will stay in contact with Dr. Clemon Chambers about potentially returning in November if needed.    Rehab Potential  Good    Clinical Impairments Affecting Rehab Potential  limited back mobility and diffuse degernative disc disease , pt will not be able to come the week of chemo     PT Frequency  1x / week    PT Duration  6 weeks    PT Treatment/Interventions  ADLs/Self Care Home Management;DME Instruction;Gait training;Stair training;Functional mobility training;Cognitive remediation;Neuromuscular re-education;Therapeutic exercise;Therapeutic activities;Patient/family education;Energy conservation    PT Next Visit Plan  D/C this visit.    Consulted and Agree with Plan of Care  Patient       Patient  will benefit from skilled therapeutic intervention in order to improve the following deficits and impairments:  Abnormal gait, Decreased knowledge of use of DME, Pain, Postural dysfunction, Increased muscle spasms, Decreased scar mobility, Decreased mobility, Decreased activity tolerance, Decreased endurance, Decreased strength, Impaired perceived functional ability, Obesity, Difficulty walking, Decreased balance, Decreased knowledge of precautions  Visit Diagnosis: Difficulty in walking  Muscle weakness (generalized)  Chronic bilateral low back pain with bilateral sciatica     Problem List Patient Active Problem List   Diagnosis Date Noted  . Encounter for antineoplastic chemotherapy 05/21/2019  . Pancytopenia, acquired ([place]) 04/10/2019  . Hypokalemia due to loss of potassium 01/16/2019  . Steroid-induced hyperglycemia 11/14/2018  . Chemotherapy-induced nausea 10/24/2018  . Other constipation 10/24/2018  . Insomnia due to medical condition 10/07/2018  . Chronic back pain greater than 3 months duration 09/26/2018  . Neuropathy due to medical condition ([place]) 09/26/2018  . Physical debility 09/26/2018  . Morbid (severe) obesity due to excess calories ([place]) 09/26/2018  . Endometrial cancer ([place]) 09/24/2018  . Adenopathy 09/24/2018    Otelia Limes, PTA 06/30/2019, 10:58 AM  [place], [place], [address] Phone: [phone]   Fax:  [phone]  Name: Kathryn [name] MRN: [id number] Date of Birth: February 01, 1951  PHYSICAL THERAPY DISCHARGE SUMMARY  Visits from Start of Care: 13  Current  functional level related to goals / functional outcomes: See above   Remaining deficits: See above   Education / Equipment: HEP  Plan: Patient agrees to discharge.  Patient goals were partially met. Patient is being discharged due to meeting the stated rehab goals.  ?????    Allyson Sabal [place],  [place] 06/30/19 12:17 PM

## 2019-07-01 ENCOUNTER — Other Ambulatory Visit: Payer: Self-pay | Admitting: *Deleted

## 2019-07-02 ENCOUNTER — Encounter: Payer: Self-pay | Admitting: Hematology and Oncology

## 2019-07-02 ENCOUNTER — Inpatient Hospital Stay: Payer: Medicare Other

## 2019-07-02 ENCOUNTER — Inpatient Hospital Stay (HOSPITAL_BASED_OUTPATIENT_CLINIC_OR_DEPARTMENT_OTHER): Payer: Medicare Other | Admitting: Hematology and Oncology

## 2019-07-02 ENCOUNTER — Other Ambulatory Visit: Payer: Self-pay

## 2019-07-02 ENCOUNTER — Inpatient Hospital Stay: Payer: Medicare Other | Attending: Hematology and Oncology

## 2019-07-02 VITALS — BP 164/75 | HR 77 | Temp 98.0°F | Resp 20 | Ht 63.0 in | Wt 295.0 lb

## 2019-07-02 DIAGNOSIS — C541 Malignant neoplasm of endometrium: Secondary | ICD-10-CM

## 2019-07-02 DIAGNOSIS — Z79899 Other long term (current) drug therapy: Secondary | ICD-10-CM | POA: Insufficient documentation

## 2019-07-02 DIAGNOSIS — D61818 Other pancytopenia: Secondary | ICD-10-CM | POA: Insufficient documentation

## 2019-07-02 DIAGNOSIS — Z6841 Body Mass Index (BMI) 40.0 and over, adult: Secondary | ICD-10-CM | POA: Diagnosis not present

## 2019-07-02 DIAGNOSIS — Z5112 Encounter for antineoplastic immunotherapy: Secondary | ICD-10-CM | POA: Diagnosis not present

## 2019-07-02 DIAGNOSIS — R5381 Other malaise: Secondary | ICD-10-CM | POA: Diagnosis not present

## 2019-07-02 DIAGNOSIS — E669 Obesity, unspecified: Secondary | ICD-10-CM | POA: Insufficient documentation

## 2019-07-02 DIAGNOSIS — Z23 Encounter for immunization: Secondary | ICD-10-CM | POA: Insufficient documentation

## 2019-07-02 DIAGNOSIS — M1990 Unspecified osteoarthritis, unspecified site: Secondary | ICD-10-CM | POA: Insufficient documentation

## 2019-07-02 DIAGNOSIS — I10 Essential (primary) hypertension: Secondary | ICD-10-CM

## 2019-07-02 DIAGNOSIS — Z9221 Personal history of antineoplastic chemotherapy: Secondary | ICD-10-CM | POA: Insufficient documentation

## 2019-07-02 LAB — CBC WITH DIFFERENTIAL (CANCER CENTER ONLY)
Abs Immature Granulocytes: 0.01 10*3/uL (ref 0.00–0.07)
Basophils Absolute: 0 10*3/uL (ref 0.0–0.1)
Basophils Relative: 0 %
Eosinophils Absolute: 0 10*3/uL (ref 0.0–0.5)
Eosinophils Relative: 1 %
HCT: 35.1 % — ABNORMAL LOW (ref 36.0–46.0)
Hemoglobin: 11.4 g/dL — ABNORMAL LOW (ref 12.0–15.0)
Immature Granulocytes: 0 %
Lymphocytes Relative: 34 %
Lymphs Abs: 1.5 10*3/uL (ref 0.7–4.0)
MCH: 33.6 pg (ref 26.0–34.0)
MCHC: 32.5 g/dL (ref 30.0–36.0)
MCV: 103.5 fL — ABNORMAL HIGH (ref 80.0–100.0)
Monocytes Absolute: 0.5 10*3/uL (ref 0.1–1.0)
Monocytes Relative: 11 %
Neutro Abs: 2.3 10*3/uL (ref 1.7–7.7)
Neutrophils Relative %: 54 %
Platelet Count: 152 10*3/uL (ref 150–400)
RBC: 3.39 MIL/uL — ABNORMAL LOW (ref 3.87–5.11)
RDW: 15.9 % — ABNORMAL HIGH (ref 11.5–15.5)
WBC Count: 4.3 10*3/uL (ref 4.0–10.5)
nRBC: 0 % (ref 0.0–0.2)

## 2019-07-02 LAB — CMP (CANCER CENTER ONLY)
ALT: 19 U/L (ref 0–44)
AST: 18 U/L (ref 15–41)
Albumin: 4 g/dL (ref 3.5–5.0)
Alkaline Phosphatase: 97 U/L (ref 38–126)
Anion gap: 10 (ref 5–15)
BUN: 14 mg/dL (ref 8–23)
CO2: 30 mmol/L (ref 22–32)
Calcium: 9.3 mg/dL (ref 8.9–10.3)
Chloride: 102 mmol/L (ref 98–111)
Creatinine: 0.81 mg/dL (ref 0.44–1.00)
GFR, Est AFR Am: >60 mL/min (ref >60)
GFR, Estimated: >60 mL/min (ref >60)
Glucose, Bld: 143 mg/dL — ABNORMAL HIGH (ref 70–99)
Potassium: 3.3 mmol/L — ABNORMAL LOW (ref 3.5–5.1)
Sodium: 142 mmol/L (ref 135–145)
Total Bilirubin: 0.3 mg/dL (ref 0.3–1.2)
Total Protein: 7.2 g/dL (ref 6.5–8.1)

## 2019-07-02 MED ORDER — INFLUENZA VAC A&B SA ADJ QUAD 0.5 ML IM PRSY
0.5000 mL | PREFILLED_SYRINGE | Freq: Once | INTRAMUSCULAR | Status: AC
  Administered 2019-07-02: 0.5 mL via INTRAMUSCULAR

## 2019-07-02 MED ORDER — ACETAMINOPHEN 325 MG PO TABS
ORAL_TABLET | ORAL | Status: AC
  Filled 2019-07-02: qty 2

## 2019-07-02 MED ORDER — HEPARIN SOD (PORK) LOCK FLUSH 100 UNIT/ML IV SOLN
500.0000 [IU] | Freq: Once | INTRAVENOUS | Status: AC | PRN
  Administered 2019-07-02: 500 [IU]
  Filled 2019-07-02: qty 5

## 2019-07-02 MED ORDER — INFLUENZA VAC A&B SA ADJ QUAD 0.5 ML IM PRSY
PREFILLED_SYRINGE | INTRAMUSCULAR | Status: AC
  Filled 2019-07-02: qty 0.5

## 2019-07-02 MED ORDER — INFLUENZA VAC SPLIT QUAD 0.5 ML IM SUSY: 0.5000 mL | PREFILLED_SYRINGE | Freq: Once | INTRAMUSCULAR | Status: DC

## 2019-07-02 MED ORDER — SODIUM CHLORIDE 0.9 % IV SOLN
Freq: Once | INTRAVENOUS | Status: AC
  Administered 2019-07-02: 09:00:00 via INTRAVENOUS
  Filled 2019-07-02: qty 250

## 2019-07-02 MED ORDER — ACETAMINOPHEN 325 MG PO TABS
650.0000 mg | ORAL_TABLET | Freq: Once | ORAL | Status: AC
  Administered 2019-07-02: 650 mg via ORAL

## 2019-07-02 MED ORDER — TRASTUZUMAB-ANNS CHEMO 150 MG IV SOLR
6.0000 mg/kg | Freq: Once | INTRAVENOUS | Status: AC
  Administered 2019-07-02: 504 mg via INTRAVENOUS
  Filled 2019-07-02: qty 24

## 2019-07-02 MED ORDER — SODIUM CHLORIDE 0.9% FLUSH
10.0000 mL | Freq: Once | INTRAVENOUS | Status: AC
  Administered 2019-07-02: 08:00:00 10 mL
  Filled 2019-07-02: qty 10

## 2019-07-02 MED ORDER — SODIUM CHLORIDE 0.9% FLUSH
10.0000 mL | INTRAVENOUS | Status: DC | PRN
  Administered 2019-07-02: 10 mL
  Filled 2019-07-02: qty 10

## 2019-07-02 NOTE — Patient Instructions (Signed)
[facility] Discharge Instructions for Patients Receiving Chemotherapy  Today you received the following chemotherapy agents: Trastuzumab   To help prevent nausea and vomiting after your treatment, we encourage you to take your nausea medication  as prescribed.    If you develop nausea and vomiting that is not controlled by your nausea medication, call the clinic.   BELOW ARE SYMPTOMS THAT SHOULD BE REPORTED IMMEDIATELY:  *FEVER GREATER THAN 100.5 F  *CHILLS WITH OR WITHOUT FEVER  NAUSEA AND VOMITING THAT IS NOT CONTROLLED WITH YOUR NAUSEA MEDICATION  *UNUSUAL SHORTNESS OF BREATH  *UNUSUAL BRUISING OR BLEEDING  TENDERNESS IN MOUTH AND THROAT WITH OR WITHOUT PRESENCE OF ULCERS  *URINARY PROBLEMS  *BOWEL PROBLEMS  UNUSUAL RASH Items with * indicate a potential emergency and should be followed up as soon as possible.  Feel free to call the clinic should you have any questions or concerns. The clinic phone number is [phone].  Please show the CHEMO ALERT CARD at check-in to the Emergency Department and triage nurse.   

## 2019-07-02 NOTE — Assessment & Plan Note (Signed)
She is quite debilitated due to recent flare of her arthritis She responded well to Medrol dose pack She will continue physical therapy as tolerated

## 2019-07-02 NOTE — Assessment & Plan Note (Signed)
She has mild pancytopenia but much improved since discontinuation of chemotherapy She is not symptomatic Observe only

## 2019-07-02 NOTE — Assessment & Plan Note (Signed)
She tolerated treatment well without side effects Pancytopenia is improving She will continue treatment indefinitely I plan to order imaging study and echocardiogram in November  We discussed the importance of preventive care and reviewed the vaccination programs. She does not have any prior allergic reactions to influenza vaccination. She agrees to proceed with influenza vaccination today and we will administer it today at the clinic.

## 2019-07-02 NOTE — Assessment & Plan Note (Signed)
Her blood pressure is elevated Her blood pressure at home is much better control She will continue close follow-up and medical management by her primary care doctor

## 2019-07-02 NOTE — Progress Notes (Signed)
[place] OFFICE PROGRESS NOTE  Patient Care Team: Ronita Hipps, MD as PCP - General (Family Medicine) Nicholaus Bloom, MD (Anesthesiology)  ASSESSMENT & PLAN:  Endometrial cancer [facility]) She tolerated treatment well without side effects Pancytopenia is improving She will continue treatment indefinitely I plan to order imaging study and echocardiogram in November  We discussed the importance of preventive care and reviewed the vaccination programs. She does not have any prior allergic reactions to influenza vaccination. She agrees to proceed with influenza vaccination today and we will administer it today at the clinic.   Pancytopenia, acquired ([place]) She has mild pancytopenia but much improved since discontinuation of chemotherapy She is not symptomatic Observe only  Physical debility She is quite debilitated due to recent flare of her arthritis She responded well to Medrol dose pack She will continue physical therapy as tolerated  Essential hypertension Her blood pressure is elevated Her blood pressure at home is much better control She will continue close follow-up and medical management by her primary care doctor   Orders Placed This Encounter  Procedures  . CA 125    Standing Status:   Standing    Number of Occurrences:   11    Standing Expiration Date:   07/01/2020    INTERVAL HISTORY: Please see below for problem oriented charting. 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 A Pap was done 09/02/18 showign ASC-H. She was referred onto GYN and seen by Dr. Paula Compton    09/09/2018 Procedure   She underwent endometrial biopsy   09/12/2018 Pathology Results   Endometrial biopsy positive for adenocarcinoma   09/22/2018 Imaging   Ct abdomen and pelvis showed: Diffuse endometrial thickening, consistent with primary endometrial carcinoma.  Pelvic and abdominal retroperitoneal and retrocrural lymphadenopathy, consistent with metastatic disease.  Moderate hepatic steatosis.   09/25/2018 Cancer Staging   Staging form: Corpus Uteri - Carcinoma and Carcinosarcoma, AJCC 8th Edition - Pathologic: Stage IIIC2 (pT2, pN2, cM0) - Signed by Heath Lark, MD on 02/23/2019   10/01/2018 Procedure   Successful placement of a right IJ approach Power Port with ultrasound and fluoroscopic guidance. The catheter is ready for use.   10/02/2018 Tumor Marker   Patient's tumor was tested for the following markers: CA-125 Results of the tumor marker test revealed 578   10/03/2018 - 01/16/2019 Chemotherapy   The patient had carboplatin and Taxol with dose adjustment due to neuropathy x 6 cycles   10/24/2018 Tumor Marker   Patient's tumor was tested for the following markers: CA-125 Results of the tumor marker test revealed 296    Genetic Testing   Patient has genetic testing done for MSI/MMR. Results revealed MSI stable and MMR normal on Accession [id number].   11/14/2018 Tumor Marker   Patient's tumor was tested for the following markers: CA-125 Results of the tumor marker test revealed 81   11/27/2018 Imaging   1. Significant decrease in abdominal retroperitoneal and bilateral iliac lymphadenopathy since  previous study. 2. Significant decrease in abnormal endometrial soft tissue density since prior study. 3. No new or progressive metastatic disease identified.   12/05/2018 Tumor Marker   Patient's tumor was  tested for the following markers: CA-125 Results of the tumor marker test revealed 46.3   12/26/2018 Tumor Marker   Patient's tumor was tested for the following markers: CA-125 Results of the tumor marker test revealed 38.2   01/16/2019 Tumor Marker   Patient's tumor was tested for the following markers: CA-125 Results of the tumor marker test revealed 30.8   02/03/2019 Imaging   1. Numerous retroperitoneal, iliac, and pelvic lymph nodes are stable or slightly decreased in size compared to immediate prior examination dated 11/27/2018 and significantly decreased in size compared to exam dated 09/20/2018.  2.  Unchanged thickening of the endometrium.  3. Other chronic, incidental, and postoperative findings as detailed above.   02/12/2019 Imaging   1. Uterus +/- tubes/ovaries, neoplastic, cervix, bilateral fallopian tubes and ovaries - MIXED HIGH-GRADE SEROUS AND ENDOMETRIAL ADENOCARCINOMA, 6.5 CM. SEE NOTE - CARCINOMA INVOLVES ENTIRE ENDOMETRIUM, ANTERIOR AND POSTERIOR LOWER UTERINE SEGMENTS AND ANTERIOR AND POSTERIOR CERVIX. - CARCINOMA INVADES FOR A DEPTH OF 1.9 CM WHERE MYOMETRIAL THICKNESS IS 2.0 CM (GREATER THAN 50% MYOMETRIAL INVASION) - CARCINOMA IS 1 MM FROM THE CERVICAL MARGIN - NEGATIVE FOR LYMPHOVASCULAR OR PERINEURAL INVASION - BILATERAL UNREMARKABLE FALLOPIAN TUBES AND OVARIES, NEGATIVE FOR CARCINOMA - SEE ONCOLOGY TABLE 2. Lymph node, biopsy, right pelvic - METASTATIC CARCINOMA TO ONE OF TWO LYMPH NODES (1/2) Microscopic Comment 1. (v4.1.0.0) UTERUS, CARCINOMA OR CARCINOSARCOMA Procedure: Total hysterectomy with bilateral salpingo-oophorectomy Histologic type: Mixed high-grade serous and endometrioid adenocarcinoma Histologic Grade: NA Myometrial invasion: Present Depth of invasion: 19 mm Myometrial thickness: 20 mm Uterine Serosa Involvement: Not identified Cervical stromal involvement: Present Extent of involvement of other organs: NA Lymphovascular invasion: Not  identified Regional Lymph Nodes: Examined: 0 Sentinel 2 Non-sentinel 2 Total Lymph nodes with metastasis: 1 Isolated tumor cells (< 0.2 mm): 0 Micrometastasis: (> 0.2 mm and < 2.0 mm): 1 Macrometastasis: (> 2.0 mm): 0 Extracapsular extension: Not identified Tumor block for ancillary studies: 52F MMR / MSI testing: Pending Pathologic Stage Classification (pTNM, AJCC 8th edition): pT2, pN34m FIGO Stage: IIIC1 Representative Tumor Block: 1A, 1C (v4.1.0.0) Diagnosis Note 1. Immunohistochemical stains for p16, p53, ER and Ki-67 show a pattern of staining consistent with mixed high-grade serous and endometrial adenocarcinoma. Dr. KLyndon Codehas reviewed this case and concurs with the above interpretation. A molecular study for microsatellite instability (MSI) and immunostains for MMR-related proteins are pending and will be reported in an addendum.   02/12/2019 Surgery   Pre-operative Diagnosis: endometrial cancer stage IIIC2, s/p neoadjuvant chemotherapy, extreme obesity (BMI 53kg/m2)  Operation: Robotic-assisted laparoscopic total hysterectomy with bilateral salpingoophorectomy, right pelvic lymphadenectomy. 22 modifier for extreme obesity  Surgeon: RDonaciano Eva Assistant Surgeon: LLahoma CrockerMD  Operative Findings:  : extreme obesity, BMI 53kg/m2, with significant retroperitoneal and intraperitoneal adiposity. Obesity necessitated an additional hour of operating time to create safe exposure. Obesity required additional personnel in the operating room for positioning and retraction. Severe obesity substantially increased the complexity of the procedure. 8cm uterus, grossly normal, normal appearing cervix. Retroperitoneal fibrosis consistent with nodal positivity. Clinically suspicious right pelvic lymph node. Unable to completely visualize retroperitoneal nodes due to extreme obesity.    02/12/2019 Pathology Results   IMMUNOHISTOCHEMICAL AND MORPHOMETRIC ANALYSIS PERFORMED  MANUALLY The tumor cells are POSITIVE for Her2 (3+). Estrogen Receptor: 70%, POSITIVE, STRONG STAINING  INTENSITY Progesterone Receptor: 70%, POSITIVE, STRONG STAINING INTENSITY Proliferation Marker Ki67: 20% REFERENCE RANGE ESTROGEN RECEPTOR NEGATIVE 0% POSITIVE =>1% REFERENCE RANGE PROGESTERONE RECEPTOR NEGATIVE 0% POSITIVE =>1% All controls stained appropriately  1. Uterus +/- tubes/ovaries, neoplastic, cervix, bilateral fallopian tubes and ovaries - MIXED HIGH-GRADE SEROUS AND ENDOMETRIAL ADENOCARCINOMA, 6.5 CM. SEE NOTE - CARCINOMA INVOLVES ENTIRE ENDOMETRIUM, ANTERIOR AND POSTERIOR LOWER UTERINE SEGMENTS AND ANTERIOR AND POSTERIOR CERVIX. - CARCINOMA INVADES FOR A DEPTH OF 1.9 CM WHERE MYOMETRIAL THICKNESS IS 2.0 CM (GREATER THAN 50% MYOMETRIAL INVASION) - CARCINOMA IS 1 MM FROM THE CERVICAL MARGIN - NEGATIVE FOR LYMPHOVASCULAR OR PERINEURAL INVASION - BILATERAL UNREMARKABLE FALLOPIAN TUBES AND OVARIES, NEGATIVE FOR CARCINOMA - SEE ONCOLOGY TABLE 2. Lymph node, biopsy, right pelvic - METASTATIC CARCINOMA TO ONE OF TWO LYMPH NODES (1/2) Microscopic Comment 1. (v4.1.0.0) UTERUS, CARCINOMA OR CARCINOSARCOMA Procedure: Total hysterectomy with bilateral salpingo-oophorectomy Histologic type: Mixed high-grade serous and endometrioid adenocarcinoma Histologic Grade: NA Myometrial invasion: Present Depth of invasion: 19 mm Myometrial thickness: 20 mm Uterine Serosa Involvement: Not identified Cervical stromal involvement: Present Extent of involvement of other organs: NA Lymphovascular invasion: Not identified Regional Lymph Nodes: Examined: 0 Sentinel 2 Non-sentinel 2 Total Lymph nodes with metastasis: 1 Isolated tumor cells (< 0.2 mm): 0 Micrometastasis: (> 0.2 mm and < 2.0 mm): 1 Macrometastasis: (> 2.0 mm): 0 Extracapsular extension: Not identified Tumor block for ancillary studies: 82F MMR / MSI testing: Pending Pathologic Stage Classification (pTNM, AJCC 8th edition):  pT2, pN74m FIGO Stage: IIIC1 Representative Tumor Block: 1A, 1C   03/17/2019 Echocardiogram   1. The left ventricle has normal systolic function, with an ejection fraction of 55-60%. The cavity size was normal. There is moderate concentric left ventricular hypertrophy. Left ventricular diastolic Doppler parameters are consistent with impaired relaxation.  2. The right ventricle has normal systolic function. The cavity was normal. There is no increase in right ventricular wall thickness.  3. GLS: -15.5%, no prior study available for comparison.   03/17/2019 Tumor Marker   Patient's tumor was tested for the following markers: CA-125 Results of the tumor marker test revealed 43   03/20/2019 -  Chemotherapy   The patient had trastuzumab (HERCEPTIN) 693 mg in sodium chloride 0.9 % 250 mL chemo infusion, 8 mg/kg = 693 mg, Intravenous,  Once, 5 of 5 cycles Dose modification: 6 mg/kg (original dose 6 mg/kg, Cycle 2, Reason: Provider Judgment) Administration: 693 mg (03/20/2019), 504 mg (04/10/2019), 504 mg (05/01/2019), 504 mg (05/21/2019), 504 mg (06/10/2019) trastuzumab-anns (KANJINTI) 504 mg in sodium chloride 0.9 % 250 mL chemo infusion, 6 mg/kg = 504 mg (100 % of original dose 6 mg/kg), Intravenous,  Once, 1 of 3 cycles Dose modification: 6 mg/kg (original dose 6 mg/kg, Cycle 6, Reason: Other (see comments), Comment: biosimilar conversion) Administration: 504 mg (07/02/2019)  for chemotherapy treatment.    04/30/2019 Tumor Marker   Patient's tumor was tested for the following markers:CA-125 Results of the tumor marker test revealed 29   05/20/2019 Imaging   1. No evidence of metastatic disease. 2. Small pelvic and left paracolic gutter ascites, new. 3. Aortic atherosclerosis (ICD10-170.0). Coronary artery calcification.     05/26/2019 Echocardiogram      1. The left ventricle has normal systolic function with an ejection fraction of 60-65%. The cavity size was normal. There is mildly increased left  ventricular wall thickness. Left ventricular diastolic parameters were normal.  2. GLS -16.4% (underestimated due to poor endocardial tracking).  3. The right ventricle has normal systolic function. The  cavity was normal. There is no increase in right ventricular wall thickness.  4. Mild calcification of the mitral valve leaflet.  5. Mild calcification of the aortic valve. No stenosis of the aortic valve.  6. The aorta is normal in size and structure.     REVIEW OF SYSTEMS:   Constitutional: Denies fevers, chills or abnormal weight loss Eyes: Denies blurriness of vision Ears, nose, mouth, throat, and face: Denies mucositis or sore throat Respiratory: Denies cough, dyspnea or wheezes Cardiovascular: Denies palpitation, chest discomfort or lower extremity swelling Gastrointestinal:  Denies nausea, heartburn or change in bowel habits Skin: Denies abnormal skin rashes Lymphatics: Denies new lymphadenopathy or easy bruising Neurological:Denies numbness, tingling or new weaknesses Behavioral/Psych: Mood is stable, no new changes  All other systems were reviewed with the patient and are negative.  I have reviewed the past medical history, past surgical history, social history and family history with the patient and they are unchanged from previous note.  ALLERGIES:  has No Known Allergies.  MEDICATIONS:  Current Outpatient Medications  Medication Sig Dispense Refill  . diphenhydrAMINE (BENADRYL) 25 mg capsule Take 100 mg by mouth 2 (two) times a day.    . gabapentin (NEURONTIN) 400 MG capsule Take 3 capsules (1,200 mg total) by mouth 3 (three) times daily. 270 capsule 1  . lidocaine-prilocaine (EMLA) cream Apply to affected area once 30 g 3  . losartan-hydrochlorothiazide (HYZAAR) 50-12.5 MG tablet Take 2 tablets by mouth daily.    . ondansetron (ZOFRAN) 8 MG tablet Take 8 mg by mouth every 8 (eight) hours as needed.    Marland Kitchen oxyCODONE-acetaminophen (PERCOCET) 10-325 MG tablet Take 1-2 tablets  by mouth every 6 (six) hours as needed for pain.    Marland Kitchen prochlorperazine (COMPAZINE) 10 MG tablet TAKE 1 TABLET BY MOUTH EVERY 6 HOURS AS NEEDED FOR NAUSEA OR VOMITING 30 tablet 1   No current facility-administered medications for this visit.    Facility-Administered Medications Ordered in Other Visits  Medication Dose Route Frequency Provider Last Rate Last Dose  . sodium chloride flush (NS) 0.9 % injection 10 mL  10 mL Intracatheter PRN Alvy Bimler, Treyson Axel, MD   10 mL at 07/02/19 1100    PHYSICAL EXAMINATION: ECOG PERFORMANCE STATUS: 2 - Symptomatic, <50% confined to bed  Vitals:   07/02/19 0839  BP: (!) 164/75  Pulse: 77  Resp: 20  Temp: 98 F (36.7 C)  SpO2: 99%   Filed Weights   07/02/19 0839  Weight: 295 lb (133.8 kg)    GENERAL:alert, no distress and comfortable.  Limited exam due to obesity SKIN: skin color, texture, turgor are normal, no rashes or significant lesions EYES: normal, Conjunctiva are pink and non-injected, sclera clear OROPHARYNX:no exudate, no erythema and lips, buccal mucosa, and tongue normal  NECK: supple, thyroid normal size, non-tender, without nodularity LYMPH:  no palpable lymphadenopathy in the cervical, axillary or inguinal LUNGS: clear to auscultation and percussion with normal breathing effort HEART: regular rate & rhythm and no murmurs and no lower extremity edema ABDOMEN:abdomen soft, non-tender and normal bowel sounds Musculoskeletal:no cyanosis of digits and no clubbing  NEURO: alert & oriented x 3 with fluent speech, no focal motor/sensory deficits  LABORATORY DATA:  I have reviewed the data as listed    Component Value Date/Time   NA 142 07/02/2019 0809   K 3.3 (L) 07/02/2019 0809   CL 102 07/02/2019 0809   CO2 30 07/02/2019 0809   GLUCOSE 143 (H) 07/02/2019 0809   BUN 14 07/02/2019 0809  CREATININE 0.81 07/02/2019 0809   CALCIUM 9.3 07/02/2019 0809   PROT 7.2 07/02/2019 0809   ALBUMIN 4.0 07/02/2019 0809   AST 18 07/02/2019 0809    ALT 19 07/02/2019 0809   ALKPHOS 97 07/02/2019 0809   BILITOT 0.3 07/02/2019 0809   GFRNONAA >60 07/02/2019 0809   GFRAA >60 07/02/2019 0809    No results found for: SPEP, UPEP  Lab Results  Component Value Date   WBC 4.3 07/02/2019   NEUTROABS 2.3 07/02/2019   HGB 11.4 (L) 07/02/2019   HCT 35.1 (L) 07/02/2019   MCV 103.5 (H) 07/02/2019   PLT 152 07/02/2019      Chemistry      Component Value Date/Time   NA 142 07/02/2019 0809   K 3.3 (L) 07/02/2019 0809   CL 102 07/02/2019 0809   CO2 30 07/02/2019 0809   BUN 14 07/02/2019 0809   CREATININE 0.81 07/02/2019 0809      Component Value Date/Time   CALCIUM 9.3 07/02/2019 0809   ALKPHOS 97 07/02/2019 0809   AST 18 07/02/2019 0809   ALT 19 07/02/2019 0809   BILITOT 0.3 07/02/2019 0809      All questions were answered. The patient knows to call the clinic with any problems, questions or concerns. No barriers to learning was detected.  I spent 15 minutes counseling the patient face to face. The total time spent in the appointment was 20 minutes and more than 50% was on counseling and review of test results  Heath Lark, MD 07/02/2019 12:46 PM

## 2019-07-07 ENCOUNTER — Ambulatory Visit: Payer: Medicare Other

## 2019-07-09 ENCOUNTER — Other Ambulatory Visit: Payer: Self-pay | Admitting: Hematology and Oncology

## 2019-07-23 ENCOUNTER — Inpatient Hospital Stay: Payer: Medicare Other

## 2019-07-23 ENCOUNTER — Inpatient Hospital Stay: Payer: Medicare Other | Attending: Hematology and Oncology

## 2019-07-23 ENCOUNTER — Other Ambulatory Visit: Payer: Self-pay

## 2019-07-23 ENCOUNTER — Inpatient Hospital Stay (HOSPITAL_BASED_OUTPATIENT_CLINIC_OR_DEPARTMENT_OTHER): Payer: Medicare Other | Admitting: Hematology and Oncology

## 2019-07-23 VITALS — BP 164/81 | HR 64 | Temp 98.2°F | Resp 18 | Ht 63.0 in | Wt 304.2 lb

## 2019-07-23 DIAGNOSIS — Z5112 Encounter for antineoplastic immunotherapy: Secondary | ICD-10-CM | POA: Insufficient documentation

## 2019-07-23 DIAGNOSIS — Z9221 Personal history of antineoplastic chemotherapy: Secondary | ICD-10-CM | POA: Diagnosis not present

## 2019-07-23 DIAGNOSIS — R600 Localized edema: Secondary | ICD-10-CM

## 2019-07-23 DIAGNOSIS — E876 Hypokalemia: Secondary | ICD-10-CM | POA: Diagnosis not present

## 2019-07-23 DIAGNOSIS — I10 Essential (primary) hypertension: Secondary | ICD-10-CM | POA: Diagnosis not present

## 2019-07-23 DIAGNOSIS — Z6841 Body Mass Index (BMI) 40.0 and over, adult: Secondary | ICD-10-CM | POA: Insufficient documentation

## 2019-07-23 DIAGNOSIS — Z79899 Other long term (current) drug therapy: Secondary | ICD-10-CM | POA: Diagnosis not present

## 2019-07-23 DIAGNOSIS — C541 Malignant neoplasm of endometrium: Secondary | ICD-10-CM

## 2019-07-23 DIAGNOSIS — E669 Obesity, unspecified: Secondary | ICD-10-CM | POA: Diagnosis not present

## 2019-07-23 DIAGNOSIS — I110 Hypertensive heart disease with heart failure: Secondary | ICD-10-CM | POA: Insufficient documentation

## 2019-07-23 DIAGNOSIS — M7989 Other specified soft tissue disorders: Secondary | ICD-10-CM | POA: Diagnosis not present

## 2019-07-23 LAB — CBC WITH DIFFERENTIAL (CANCER CENTER ONLY)
Abs Immature Granulocytes: 0.01 10*3/uL (ref 0.00–0.07)
Basophils Absolute: 0 10*3/uL (ref 0.0–0.1)
Basophils Relative: 0 %
Eosinophils Absolute: 0 10*3/uL (ref 0.0–0.5)
Eosinophils Relative: 1 %
HCT: 34 % — ABNORMAL LOW (ref 36.0–46.0)
Hemoglobin: 11 g/dL — ABNORMAL LOW (ref 12.0–15.0)
Immature Granulocytes: 0 %
Lymphocytes Relative: 33 %
Lymphs Abs: 1.6 10*3/uL (ref 0.7–4.0)
MCH: 33.6 pg (ref 26.0–34.0)
MCHC: 32.4 g/dL (ref 30.0–36.0)
MCV: 104 fL — ABNORMAL HIGH (ref 80.0–100.0)
Monocytes Absolute: 0.5 10*3/uL (ref 0.1–1.0)
Monocytes Relative: 11 %
Neutro Abs: 2.6 10*3/uL (ref 1.7–7.7)
Neutrophils Relative %: 55 %
Platelet Count: 151 10*3/uL (ref 150–400)
RBC: 3.27 MIL/uL — ABNORMAL LOW (ref 3.87–5.11)
RDW: 14.5 % (ref 11.5–15.5)
WBC Count: 4.8 10*3/uL (ref 4.0–10.5)
nRBC: 0 % (ref 0.0–0.2)

## 2019-07-23 LAB — CMP (CANCER CENTER ONLY)
ALT: 14 U/L (ref 0–44)
AST: 16 U/L (ref 15–41)
Albumin: 3.7 g/dL (ref 3.5–5.0)
Alkaline Phosphatase: 95 U/L (ref 38–126)
Anion gap: 9 (ref 5–15)
BUN: 12 mg/dL (ref 8–23)
CO2: 30 mmol/L (ref 22–32)
Calcium: 9.1 mg/dL (ref 8.9–10.3)
Chloride: 101 mmol/L (ref 98–111)
Creatinine: 0.74 mg/dL (ref 0.44–1.00)
GFR, Est AFR Am: >60 mL/min (ref >60)
GFR, Estimated: >60 mL/min (ref >60)
Glucose, Bld: 107 mg/dL — ABNORMAL HIGH (ref 70–99)
Potassium: 3.3 mmol/L — ABNORMAL LOW (ref 3.5–5.1)
Sodium: 140 mmol/L (ref 135–145)
Total Bilirubin: 0.4 mg/dL (ref 0.3–1.2)
Total Protein: 6.9 g/dL (ref 6.5–8.1)

## 2019-07-23 MED ORDER — SODIUM CHLORIDE 0.9 % IV SOLN
Freq: Once | INTRAVENOUS | Status: AC
  Administered 2019-07-23: 10:00:00 via INTRAVENOUS
  Filled 2019-07-23: qty 250

## 2019-07-23 MED ORDER — TRASTUZUMAB-ANNS CHEMO 150 MG IV SOLR
6.0000 mg/kg | Freq: Once | INTRAVENOUS | Status: AC
  Administered 2019-07-23: 10:00:00 504 mg via INTRAVENOUS
  Filled 2019-07-23: qty 24

## 2019-07-23 MED ORDER — DIPHENHYDRAMINE HCL 25 MG PO CAPS: 25.0000 mg | ORAL_CAPSULE | Freq: Once | ORAL | Status: DC

## 2019-07-23 MED ORDER — FUROSEMIDE 20 MG PO TABS: 20.0000 mg | ORAL_TABLET | Freq: Every day | ORAL | 1 refills | Status: DC

## 2019-07-23 MED ORDER — ACETAMINOPHEN 325 MG PO TABS
ORAL_TABLET | ORAL | Status: AC
  Filled 2019-07-23: qty 2

## 2019-07-23 MED ORDER — SODIUM CHLORIDE 0.9% FLUSH
10.0000 mL | INTRAVENOUS | Status: DC | PRN
  Administered 2019-07-23: 11:00:00 10 mL
  Filled 2019-07-23: qty 10

## 2019-07-23 MED ORDER — HEPARIN SOD (PORK) LOCK FLUSH 100 UNIT/ML IV SOLN
500.0000 [IU] | Freq: Once | INTRAVENOUS | Status: AC | PRN
  Administered 2019-07-23: 500 [IU]
  Filled 2019-07-23: qty 5

## 2019-07-23 MED ORDER — SODIUM CHLORIDE 0.9% FLUSH
10.0000 mL | Freq: Once | INTRAVENOUS | Status: AC
  Administered 2019-07-23: 09:00:00 10 mL
  Filled 2019-07-23: qty 10

## 2019-07-23 MED ORDER — ACETAMINOPHEN 325 MG PO TABS: 650.0000 mg | ORAL_TABLET | Freq: Once | ORAL | Status: DC

## 2019-07-23 NOTE — Patient Instructions (Signed)
[facility] Discharge Instructions for Patients Receiving Chemotherapy  Today you received the following chemotherapy agents: Trastuzumab   To help prevent nausea and vomiting after your treatment, we encourage you to take your nausea medication  as prescribed.    If you develop nausea and vomiting that is not controlled by your nausea medication, call the clinic.   BELOW ARE SYMPTOMS THAT SHOULD BE REPORTED IMMEDIATELY:  *FEVER GREATER THAN 100.5 F  *CHILLS WITH OR WITHOUT FEVER  NAUSEA AND VOMITING THAT IS NOT CONTROLLED WITH YOUR NAUSEA MEDICATION  *UNUSUAL SHORTNESS OF BREATH  *UNUSUAL BRUISING OR BLEEDING  TENDERNESS IN MOUTH AND THROAT WITH OR WITHOUT PRESENCE OF ULCERS  *URINARY PROBLEMS  *BOWEL PROBLEMS  UNUSUAL RASH Items with * indicate a potential emergency and should be followed up as soon as possible.  Feel free to call the clinic should you have any questions or concerns. The clinic phone number is [phone].  Please show the CHEMO ALERT CARD at check-in to the Emergency Department and triage nurse.   

## 2019-07-24 ENCOUNTER — Encounter: Payer: Self-pay | Admitting: Hematology and Oncology

## 2019-07-24 LAB — CA 125: Cancer Antigen (CA) 125: 20.9 U/mL (ref 0.0–38.1)

## 2019-07-24 NOTE — Assessment & Plan Note (Signed)
Blood pressure is elevated It could be related to recent changes in diet I recommend additional diuretic therapy with goal to get her weight under 300 pounds We have extensive discussion about dietary change and lifestyle modification

## 2019-07-24 NOTE — Progress Notes (Signed)
[place] OFFICE PROGRESS NOTE  Patient Care Team: Ronita Hipps, MD as PCP - General (Family Medicine) Nicholaus Bloom, MD (Anesthesiology)  ASSESSMENT & PLAN:  Endometrial cancer [facility]) She tolerated treatment well without major side effects Her weight gain is likely due to fluid retention but I do not believe this is due to congestive heart failure, rather related to dietary changes She will continue Herceptin at reduced dose I plan to order echocardiogram and imaging study next month  Essential hypertension Blood pressure is elevated It could be related to recent changes in diet I recommend additional diuretic therapy with goal to get her weight under 300 pounds We have extensive discussion about dietary change and lifestyle modification  Hypokalemia due to loss of potassium She has mild hypokalemia likely due to her diuretic treatment but she is not symptomatic Observe only She will continue on potassium rich diet   No orders of the defined types were placed in this encounter.   INTERVAL HISTORY: Please see below for problem oriented charting. 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 A Pap was done 09/02/18 showign ASC-H. She was referred onto GYN and seen by Dr. Paula Compton    09/09/2018 Procedure   She underwent endometrial biopsy   09/12/2018 Pathology  Results   Endometrial biopsy positive for adenocarcinoma   09/22/2018 Imaging   Ct abdomen and pelvis showed: Diffuse endometrial thickening, consistent with primary endometrial carcinoma.  Pelvic and abdominal retroperitoneal and retrocrural lymphadenopathy, consistent with metastatic disease.  Moderate hepatic steatosis.   09/25/2018 Cancer Staging   Staging form: Corpus Uteri - Carcinoma and Carcinosarcoma, AJCC 8th Edition - Pathologic: Stage IIIC2 (pT2, pN2, cM0) - Signed by Heath Lark, MD on 02/23/2019   10/01/2018 Procedure   Successful placement of a right IJ approach Power Port with ultrasound and fluoroscopic guidance. The catheter is ready for use.   10/02/2018 Tumor Marker   Patient's tumor was tested for the following markers: CA-125 Results of the tumor marker test revealed 578   10/03/2018 - 01/16/2019 Chemotherapy   The patient had carboplatin and Taxol with dose adjustment due to neuropathy x 6 cycles   10/24/2018 Tumor Marker   Patient's tumor was tested for the following markers: CA-125 Results of the tumor marker test revealed 296    Genetic Testing   Patient has genetic testing done for MSI/MMR. Results revealed MSI stable and MMR normal on Accession [id number].   11/14/2018 Tumor Marker   Patient's tumor was tested for the following markers: CA-125 Results of the tumor marker test revealed 81   11/27/2018 Imaging   1. Significant decrease in abdominal retroperitoneal and bilateral iliac lymphadenopathy since previous study. 2. Significant decrease in abnormal endometrial soft tissue density since prior study. 3. No new or progressive metastatic disease identified.   12/05/2018 Tumor Marker   Patient's tumor was tested for the following markers: CA-125 Results  of the tumor marker test revealed 46.3   12/26/2018 Tumor Marker   Patient's tumor was tested for the following markers: CA-125 Results of the tumor marker test revealed 38.2   01/16/2019 Tumor  Marker   Patient's tumor was tested for the following markers: CA-125 Results of the tumor marker test revealed 30.8   02/03/2019 Imaging   1. Numerous retroperitoneal, iliac, and pelvic lymph nodes are stable or slightly decreased in size compared to immediate prior examination dated 11/27/2018 and significantly decreased in size compared to exam dated 09/20/2018.  2.  Unchanged thickening of the endometrium.  3. Other chronic, incidental, and postoperative findings as detailed above.   02/12/2019 Imaging   1. Uterus +/- tubes/ovaries, neoplastic, cervix, bilateral fallopian tubes and ovaries - MIXED HIGH-GRADE SEROUS AND ENDOMETRIAL ADENOCARCINOMA, 6.5 CM. SEE NOTE - CARCINOMA INVOLVES ENTIRE ENDOMETRIUM, ANTERIOR AND POSTERIOR LOWER UTERINE SEGMENTS AND ANTERIOR AND POSTERIOR CERVIX. - CARCINOMA INVADES FOR A DEPTH OF 1.9 CM WHERE MYOMETRIAL THICKNESS IS 2.0 CM (GREATER THAN 50% MYOMETRIAL INVASION) - CARCINOMA IS 1 MM FROM THE CERVICAL MARGIN - NEGATIVE FOR LYMPHOVASCULAR OR PERINEURAL INVASION - BILATERAL UNREMARKABLE FALLOPIAN TUBES AND OVARIES, NEGATIVE FOR CARCINOMA - SEE ONCOLOGY TABLE 2. Lymph node, biopsy, right pelvic - METASTATIC CARCINOMA TO ONE OF TWO LYMPH NODES (1/2) Microscopic Comment 1. (v4.1.0.0) UTERUS, CARCINOMA OR CARCINOSARCOMA Procedure: Total hysterectomy with bilateral salpingo-oophorectomy Histologic type: Mixed high-grade serous and endometrioid adenocarcinoma Histologic Grade: NA Myometrial invasion: Present Depth of invasion: 19 mm Myometrial thickness: 20 mm Uterine Serosa Involvement: Not identified Cervical stromal involvement: Present Extent of involvement of other organs: NA Lymphovascular invasion: Not identified Regional Lymph Nodes: Examined: 0 Sentinel 2 Non-sentinel 2 Total Lymph nodes with metastasis: 1 Isolated tumor cells (< 0.2 mm): 0 Micrometastasis: (> 0.2 mm and < 2.0 mm): 1 Macrometastasis: (> 2.0 mm): 0 Extracapsular  extension: Not identified Tumor block for ancillary studies: 13F MMR / MSI testing: Pending Pathologic Stage Classification (pTNM, AJCC 8th edition): pT2, pN44m FIGO Stage: IIIC1 Representative Tumor Block: 1A, 1C (v4.1.0.0) Diagnosis Note 1. Immunohistochemical stains for p16, p53, ER and Ki-67 show a pattern of staining consistent with mixed high-grade serous and endometrial adenocarcinoma. Dr. KLyndon Codehas reviewed this case and concurs with the above interpretation. A molecular study for microsatellite instability (MSI) and immunostains for MMR-related proteins are pending and will be reported in an addendum.   02/12/2019 Surgery   Pre-operative Diagnosis: endometrial cancer stage IIIC2, s/p neoadjuvant chemotherapy, extreme obesity (BMI 53kg/m2)  Operation: Robotic-assisted laparoscopic total hysterectomy with bilateral salpingoophorectomy, right pelvic lymphadenectomy. 22 modifier for extreme obesity  Surgeon: RDonaciano Eva Assistant Surgeon: LLahoma CrockerMD  Operative Findings:  : extreme obesity, BMI 53kg/m2, with significant retroperitoneal and intraperitoneal adiposity. Obesity necessitated an additional hour of operating time to create safe exposure. Obesity required additional personnel in the operating room for positioning and retraction. Severe obesity substantially increased the complexity of the procedure. 8cm uterus, grossly normal, normal appearing cervix. Retroperitoneal fibrosis consistent with nodal positivity. Clinically suspicious right pelvic lymph node. Unable to completely visualize retroperitoneal nodes due to extreme obesity.    02/12/2019 Pathology Results   IMMUNOHISTOCHEMICAL AND MORPHOMETRIC ANALYSIS PERFORMED MANUALLY The tumor cells are POSITIVE for Her2 (3+). Estrogen Receptor: 70%, POSITIVE, STRONG STAINING INTENSITY Progesterone Receptor: 70%, POSITIVE, STRONG STAINING INTENSITY Proliferation Marker Ki67: 20% REFERENCE RANGE ESTROGEN  RECEPTOR NEGATIVE 0% POSITIVE =>1% REFERENCE RANGE PROGESTERONE RECEPTOR NEGATIVE 0% POSITIVE =>1% All controls stained appropriately  1. Uterus +/- tubes/ovaries, neoplastic, cervix,  bilateral fallopian tubes and ovaries - MIXED HIGH-GRADE SEROUS AND ENDOMETRIAL ADENOCARCINOMA, 6.5 CM. SEE NOTE - CARCINOMA INVOLVES ENTIRE ENDOMETRIUM, ANTERIOR AND POSTERIOR LOWER UTERINE SEGMENTS AND ANTERIOR AND POSTERIOR CERVIX. - CARCINOMA INVADES FOR A DEPTH OF 1.9 CM WHERE MYOMETRIAL THICKNESS IS 2.0 CM (GREATER THAN 50% MYOMETRIAL INVASION) - CARCINOMA IS 1 MM FROM THE CERVICAL MARGIN - NEGATIVE FOR LYMPHOVASCULAR OR PERINEURAL INVASION - BILATERAL UNREMARKABLE FALLOPIAN TUBES AND OVARIES, NEGATIVE FOR CARCINOMA - SEE ONCOLOGY TABLE 2. Lymph node, biopsy, right pelvic - METASTATIC CARCINOMA TO ONE OF TWO LYMPH NODES (1/2) Microscopic Comment 1. (v4.1.0.0) UTERUS, CARCINOMA OR CARCINOSARCOMA Procedure: Total hysterectomy with bilateral salpingo-oophorectomy Histologic type: Mixed high-grade serous and endometrioid adenocarcinoma Histologic Grade: NA Myometrial invasion: Present Depth of invasion: 19 mm Myometrial thickness: 20 mm Uterine Serosa Involvement: Not identified Cervical stromal involvement: Present Extent of involvement of other organs: NA Lymphovascular invasion: Not identified Regional Lymph Nodes: Examined: 0 Sentinel 2 Non-sentinel 2 Total Lymph nodes with metastasis: 1 Isolated tumor cells (< 0.2 mm): 0 Micrometastasis: (> 0.2 mm and < 2.0 mm): 1 Macrometastasis: (> 2.0 mm): 0 Extracapsular extension: Not identified Tumor block for ancillary studies: 88F MMR / MSI testing: Pending Pathologic Stage Classification (pTNM, AJCC 8th edition): pT2, pN44m FIGO Stage: IIIC1 Representative Tumor Block: 1A, 1C   03/17/2019 Echocardiogram   1. The left ventricle has normal systolic function, with an ejection fraction of 55-60%. The cavity size was normal. There is moderate  concentric left ventricular hypertrophy. Left ventricular diastolic Doppler parameters are consistent with impaired relaxation.  2. The right ventricle has normal systolic function. The cavity was normal. There is no increase in right ventricular wall thickness.  3. GLS: -15.5%, no prior study available for comparison.   03/17/2019 Tumor Marker   Patient's tumor was tested for the following markers: CA-125 Results of the tumor marker test revealed 43   03/20/2019 -  Chemotherapy   The patient had trastuzumab (HERCEPTIN) 693 mg in sodium chloride 0.9 % 250 mL chemo infusion, 8 mg/kg = 693 mg, Intravenous,  Once, 5 of 5 cycles Dose modification: 6 mg/kg (original dose 6 mg/kg, Cycle 2, Reason: Provider Judgment) Administration: 693 mg (03/20/2019), 504 mg (04/10/2019), 504 mg (05/01/2019), 504 mg (05/21/2019), 504 mg (06/10/2019) trastuzumab-anns (KANJINTI) 504 mg in sodium chloride 0.9 % 250 mL chemo infusion, 6 mg/kg = 504 mg (100 % of original dose 6 mg/kg), Intravenous,  Once, 2 of 3 cycles Dose modification: 6 mg/kg (original dose 6 mg/kg, Cycle 6, Reason: Other (see comments), Comment: biosimilar conversion) Administration: 504 mg (07/02/2019), 504 mg (07/23/2019)  for chemotherapy treatment.    04/30/2019 Tumor Marker   Patient's tumor was tested for the following markers:CA-125 Results of the tumor marker test revealed 29   05/20/2019 Imaging   1. No evidence of metastatic disease. 2. Small pelvic and left paracolic gutter ascites, new. 3. Aortic atherosclerosis (ICD10-170.0). Coronary artery calcification.     05/26/2019 Echocardiogram      1. The left ventricle has normal systolic function with an ejection fraction of 60-65%. The cavity size was normal. There is mildly increased left ventricular wall thickness. Left ventricular diastolic parameters were normal.  2. GLS -16.4% (underestimated due to poor endocardial tracking).  3. The right ventricle has normal systolic function. The cavity was  normal. There is no increase in right ventricular wall thickness.  4. Mild calcification of the mitral valve leaflet.  5. Mild calcification of the aortic valve. No stenosis of the aortic valve.  6. The aorta is normal in size and structure.     REVIEW OF SYSTEMS:   Constitutional: Denies fevers, chills or abnormal weight loss Eyes: Denies blurriness of vision Ears, nose, mouth, throat, and face: Denies mucositis or sore throat Respiratory: Denies cough, dyspnea or wheezes Cardiovascular: Denies palpitation, chest discomfort  Gastrointestinal:  Denies nausea, heartburn or change in bowel habits Skin: Denies abnormal skin rashes Lymphatics: Denies new lymphadenopathy or easy bruising Neurological:Denies numbness, tingling or new weaknesses Behavioral/Psych: Mood is stable, no new changes  All other systems were reviewed with the patient and are negative.  I have reviewed the past medical history, past surgical history, social history and family history with the patient and they are unchanged from previous note.  ALLERGIES:  has No Known Allergies.  MEDICATIONS:  Current Outpatient Medications  Medication Sig Dispense Refill  . diphenhydrAMINE (BENADRYL) 25 mg capsule Take 100 mg by mouth 2 (two) times a day.    . furosemide (LASIX) 20 MG tablet Take 1 tablet (20 mg total) by mouth daily. 30 tablet 1  . gabapentin (NEURONTIN) 400 MG capsule Take 3 capsules (1,200 mg total) by mouth 3 (three) times daily. 270 capsule 1  . lidocaine-prilocaine (EMLA) cream Apply to affected area once 30 g 3  . losartan-hydrochlorothiazide (HYZAAR) 50-12.5 MG tablet Take 2 tablets by mouth daily.    . ondansetron (ZOFRAN) 8 MG tablet Take 8 mg by mouth every 8 (eight) hours as needed.    Marland Kitchen oxyCODONE-acetaminophen (PERCOCET) 10-325 MG tablet Take 1-2 tablets by mouth every 6 (six) hours as needed for pain.    Marland Kitchen prochlorperazine (COMPAZINE) 10 MG tablet TAKE 1 TABLET BY MOUTH EVERY 6 HOURS AS NEEDED FOR  NAUSEA OR VOMITING 30 tablet 1   No current facility-administered medications for this visit.     PHYSICAL EXAMINATION: ECOG PERFORMANCE STATUS: 1 - Symptomatic but completely ambulatory  Vitals:   07/23/19 0915  BP: (!) 164/81  Pulse: 64  Resp: 18  Temp: 98.2 F (36.8 C)  SpO2: 98%   Filed Weights   07/23/19 0915  Weight: (!) 304 lb 3.2 oz (138 kg)    GENERAL:alert, no distress and comfortable.  Limited exam due to body habitus SKIN: skin color, texture, turgor are normal, no rashes or significant lesions EYES: normal, Conjunctiva are pink and non-injected, sclera clear OROPHARYNX:no exudate, no erythema and lips, buccal mucosa, and tongue normal  NECK: supple, thyroid normal size, non-tender, without nodularity LYMPH:  no palpable lymphadenopathy in the cervical, axillary or inguinal LUNGS: clear to auscultation and percussion with normal breathing effort HEART: regular rate & rhythm and no murmurs with bilateral lower extremity edema ABDOMEN:abdomen soft, non-tender and normal bowel sounds Musculoskeletal:no cyanosis of digits and no clubbing  NEURO: alert & oriented x 3 with fluent speech, no focal motor/sensory deficits  LABORATORY DATA:  I have reviewed the data as listed    Component Value Date/Time   NA 140 07/23/2019 0854   K 3.3 (L) 07/23/2019 0854   CL 101 07/23/2019 0854   CO2 30 07/23/2019 0854   GLUCOSE 107 (H) 07/23/2019 0854   BUN 12 07/23/2019 0854   CREATININE 0.74 07/23/2019 0854   CALCIUM 9.1 07/23/2019 0854   PROT 6.9 07/23/2019 0854   ALBUMIN 3.7 07/23/2019 0854   AST 16 07/23/2019 0854   ALT 14 07/23/2019 0854   ALKPHOS 95 07/23/2019 0854   BILITOT 0.4 07/23/2019 0854   GFRNONAA >60 07/23/2019 0854   GFRAA >60 07/23/2019 2111  No results found for: SPEP, UPEP  Lab Results  Component Value Date   WBC 4.8 07/23/2019   NEUTROABS 2.6 07/23/2019   HGB 11.0 (L) 07/23/2019   HCT 34.0 (L) 07/23/2019   MCV 104.0 (H) 07/23/2019   PLT 151  07/23/2019      Chemistry      Component Value Date/Time   NA 140 07/23/2019 0854   K 3.3 (L) 07/23/2019 0854   CL 101 07/23/2019 0854   CO2 30 07/23/2019 0854   BUN 12 07/23/2019 0854   CREATININE 0.74 07/23/2019 0854      Component Value Date/Time   CALCIUM 9.1 07/23/2019 0854   ALKPHOS 95 07/23/2019 0854   AST 16 07/23/2019 0854   ALT 14 07/23/2019 0854   BILITOT 0.4 07/23/2019 0854     All questions were answered. The patient knows to call the clinic with any problems, questions or concerns. No barriers to learning was detected.  I spent 20 minutes counseling the patient face to face. The total time spent in the appointment was 20 minutes and more than 50% was on counseling and review of test results  Heath Lark, MD 07/24/2019 7:56 AM

## 2019-07-24 NOTE — Assessment & Plan Note (Signed)
She tolerated treatment well without major side effects Her weight gain is likely due to fluid retention but I do not believe this is due to congestive heart failure, rather related to dietary changes She will continue Herceptin at reduced dose I plan to order echocardiogram and imaging study next month

## 2019-07-24 NOTE — Assessment & Plan Note (Signed)
She has mild hypokalemia likely due to her diuretic treatment but she is not symptomatic Observe only She will continue on potassium rich diet

## 2019-08-12 DIAGNOSIS — M961 Postlaminectomy syndrome, not elsewhere classified: Secondary | ICD-10-CM | POA: Diagnosis not present

## 2019-08-12 DIAGNOSIS — Z79891 Long term (current) use of opiate analgesic: Secondary | ICD-10-CM | POA: Diagnosis not present

## 2019-08-12 DIAGNOSIS — G894 Chronic pain syndrome: Secondary | ICD-10-CM | POA: Diagnosis not present

## 2019-08-12 DIAGNOSIS — M5416 Radiculopathy, lumbar region: Secondary | ICD-10-CM | POA: Diagnosis not present

## 2019-08-13 ENCOUNTER — Inpatient Hospital Stay: Payer: Medicare Other

## 2019-08-13 ENCOUNTER — Encounter: Payer: Self-pay | Admitting: Hematology and Oncology

## 2019-08-13 ENCOUNTER — Other Ambulatory Visit: Payer: Self-pay

## 2019-08-13 ENCOUNTER — Telehealth: Payer: Self-pay | Admitting: Hematology and Oncology

## 2019-08-13 ENCOUNTER — Inpatient Hospital Stay (HOSPITAL_BASED_OUTPATIENT_CLINIC_OR_DEPARTMENT_OTHER): Payer: Medicare Other | Admitting: Hematology and Oncology

## 2019-08-13 VITALS — BP 142/70 | HR 70 | Temp 97.8°F | Resp 18 | Ht 63.0 in | Wt 296.8 lb

## 2019-08-13 DIAGNOSIS — Z5112 Encounter for antineoplastic immunotherapy: Secondary | ICD-10-CM | POA: Diagnosis not present

## 2019-08-13 DIAGNOSIS — I110 Hypertensive heart disease with heart failure: Secondary | ICD-10-CM | POA: Diagnosis not present

## 2019-08-13 DIAGNOSIS — I10 Essential (primary) hypertension: Secondary | ICD-10-CM | POA: Diagnosis not present

## 2019-08-13 DIAGNOSIS — E876 Hypokalemia: Secondary | ICD-10-CM | POA: Diagnosis not present

## 2019-08-13 DIAGNOSIS — C541 Malignant neoplasm of endometrium: Secondary | ICD-10-CM

## 2019-08-13 DIAGNOSIS — Z79899 Other long term (current) drug therapy: Secondary | ICD-10-CM | POA: Diagnosis not present

## 2019-08-13 DIAGNOSIS — Z5111 Encounter for antineoplastic chemotherapy: Secondary | ICD-10-CM

## 2019-08-13 DIAGNOSIS — M7989 Other specified soft tissue disorders: Secondary | ICD-10-CM | POA: Diagnosis not present

## 2019-08-13 DIAGNOSIS — Z9221 Personal history of antineoplastic chemotherapy: Secondary | ICD-10-CM | POA: Diagnosis not present

## 2019-08-13 DIAGNOSIS — R600 Localized edema: Secondary | ICD-10-CM | POA: Diagnosis not present

## 2019-08-13 LAB — CBC WITH DIFFERENTIAL (CANCER CENTER ONLY)
Abs Immature Granulocytes: 0 10*3/uL (ref 0.00–0.07)
Basophils Absolute: 0 10*3/uL (ref 0.0–0.1)
Basophils Relative: 1 %
Eosinophils Absolute: 0.1 10*3/uL (ref 0.0–0.5)
Eosinophils Relative: 1 %
HCT: 36.9 % (ref 36.0–46.0)
Hemoglobin: 12 g/dL (ref 12.0–15.0)
Immature Granulocytes: 0 %
Lymphocytes Relative: 38 %
Lymphs Abs: 1.7 10*3/uL (ref 0.7–4.0)
MCH: 33.1 pg (ref 26.0–34.0)
MCHC: 32.5 g/dL (ref 30.0–36.0)
MCV: 101.7 fL — ABNORMAL HIGH (ref 80.0–100.0)
Monocytes Absolute: 0.5 10*3/uL (ref 0.1–1.0)
Monocytes Relative: 11 %
Neutro Abs: 2.1 10*3/uL (ref 1.7–7.7)
Neutrophils Relative %: 49 %
Platelet Count: 174 10*3/uL (ref 150–400)
RBC: 3.63 MIL/uL — ABNORMAL LOW (ref 3.87–5.11)
RDW: 13.9 % (ref 11.5–15.5)
WBC Count: 4.3 10*3/uL (ref 4.0–10.5)
nRBC: 0 % (ref 0.0–0.2)

## 2019-08-13 LAB — CMP (CANCER CENTER ONLY)
ALT: 18 U/L (ref 0–44)
AST: 20 U/L (ref 15–41)
Albumin: 4 g/dL (ref 3.5–5.0)
Alkaline Phosphatase: 91 U/L (ref 38–126)
Anion gap: 13 (ref 5–15)
BUN: 17 mg/dL (ref 8–23)
CO2: 27 mmol/L (ref 22–32)
Calcium: 9.1 mg/dL (ref 8.9–10.3)
Chloride: 101 mmol/L (ref 98–111)
Creatinine: 0.86 mg/dL (ref 0.44–1.00)
GFR, Est AFR Am: >60 mL/min (ref >60)
GFR, Estimated: >60 mL/min (ref >60)
Glucose, Bld: 115 mg/dL — ABNORMAL HIGH (ref 70–99)
Potassium: 3.2 mmol/L — ABNORMAL LOW (ref 3.5–5.1)
Sodium: 141 mmol/L (ref 135–145)
Total Bilirubin: 0.4 mg/dL (ref 0.3–1.2)
Total Protein: 7.4 g/dL (ref 6.5–8.1)

## 2019-08-13 MED ORDER — TRASTUZUMAB-ANNS CHEMO 150 MG IV SOLR
6.0000 mg/kg | Freq: Once | INTRAVENOUS | Status: AC
  Administered 2019-08-13: 504 mg via INTRAVENOUS
  Filled 2019-08-13: qty 24

## 2019-08-13 MED ORDER — SODIUM CHLORIDE 0.9% FLUSH
10.0000 mL | INTRAVENOUS | Status: DC | PRN
  Filled 2019-08-13: qty 10

## 2019-08-13 MED ORDER — SODIUM CHLORIDE 0.9% FLUSH
10.0000 mL | Freq: Once | INTRAVENOUS | Status: AC
  Administered 2019-08-13: 08:00:00 10 mL
  Filled 2019-08-13: qty 10

## 2019-08-13 MED ORDER — SODIUM CHLORIDE 0.9 % IV SOLN
Freq: Once | INTRAVENOUS | Status: AC
  Administered 2019-08-13: 10:00:00 via INTRAVENOUS
  Filled 2019-08-13: qty 250

## 2019-08-13 MED ORDER — DIPHENHYDRAMINE HCL 25 MG PO CAPS: 25.0000 mg | ORAL_CAPSULE | Freq: Once | ORAL | Status: DC

## 2019-08-13 MED ORDER — HEPARIN SOD (PORK) LOCK FLUSH 100 UNIT/ML IV SOLN
500.0000 [IU] | Freq: Once | INTRAVENOUS | Status: DC | PRN
  Filled 2019-08-13: qty 5

## 2019-08-13 MED ORDER — ACETAMINOPHEN 325 MG PO TABS: 650.0000 mg | ORAL_TABLET | Freq: Once | ORAL | Status: DC

## 2019-08-13 NOTE — Assessment & Plan Note (Signed)
She tolerated treatment well without major side effects Her weight gain is likely due to fluid retention but I do not believe this is due to congestive heart failure, rather related to dietary changes She will continue Herceptin at reduced dose She has responded well with low-dose furosemide and have lost some fluid weight.  She will continue the same I plan to order echocardiogram and imaging study next month

## 2019-08-13 NOTE — Assessment & Plan Note (Signed)
She has mild hypokalemia likely due to her diuretic treatment but she is not symptomatic Observe only She will continue on potassium rich diet

## 2019-08-13 NOTE — Telephone Encounter (Signed)
Scheduled appt per 10/29 sch message - left message for patient with appt date and time

## 2019-08-13 NOTE — Patient Instructions (Signed)
[facility] Discharge Instructions for Patients Receiving Chemotherapy  Today you received the following chemotherapy agents: Trastuzumab   To help prevent nausea and vomiting after your treatment, we encourage you to take your nausea medication  as prescribed.    If you develop nausea and vomiting that is not controlled by your nausea medication, call the clinic.   BELOW ARE SYMPTOMS THAT SHOULD BE REPORTED IMMEDIATELY:  *FEVER GREATER THAN 100.5 F  *CHILLS WITH OR WITHOUT FEVER  NAUSEA AND VOMITING THAT IS NOT CONTROLLED WITH YOUR NAUSEA MEDICATION  *UNUSUAL SHORTNESS OF BREATH  *UNUSUAL BRUISING OR BLEEDING  TENDERNESS IN MOUTH AND THROAT WITH OR WITHOUT PRESENCE OF ULCERS  *URINARY PROBLEMS  *BOWEL PROBLEMS  UNUSUAL RASH Items with * indicate a potential emergency and should be followed up as soon as possible.  Feel free to call the clinic should you have any questions or concerns. The clinic phone number is [phone].  Please show the CHEMO ALERT CARD at check-in to the Emergency Department and triage nurse.   

## 2019-08-13 NOTE — Assessment & Plan Note (Signed)
Her blood pressure is stable/improved She will continue low-dose diuretic therapy

## 2019-08-13 NOTE — Progress Notes (Signed)
[place] OFFICE PROGRESS NOTE  Patient Care Team: Ronita Hipps, MD as PCP - General (Family Medicine) Nicholaus Bloom, MD (Anesthesiology)  ASSESSMENT & PLAN:  Endometrial cancer [facility]) She tolerated treatment well without major side effects Her weight gain is likely due to fluid retention but I do not believe this is due to congestive heart failure, rather related to dietary changes She will continue Herceptin at reduced dose She has responded well with low-dose furosemide and have lost some fluid weight.  She will continue the same I plan to order echocardiogram and imaging study next month  Hypokalemia due to loss of potassium She has mild hypokalemia likely due to her diuretic treatment but she is not symptomatic Observe only She will continue on potassium rich diet  Essential hypertension Her blood pressure is stable/improved She will continue low-dose diuretic therapy   Orders Placed This Encounter  Procedures  . CT ABDOMEN PELVIS W CONTRAST    Standing Status:   Future    Standing Expiration Date:   08/12/2020    Order Specific Question:   If indicated for the ordered procedure, I authorize the administration of contrast media per Radiology protocol    Answer:   Yes    Order Specific Question:   Preferred imaging location?    Answer:   [facility]    Order Specific Question:   Radiology Contrast Protocol - do NOT remove file path    Answer:   [email or web address]  . ECHOCARDIOGRAM COMPLETE    Standing Status:   Future    Standing Expiration Date:   11/12/2020    Order Specific Question:   Where should this test be performed    Answer:   [place]    Order Specific Question:   Perflutren DEFINITY (image enhancing agent) should be administered unless hypersensitivity or allergy exist    Answer:   Administer Perflutren    Order Specific Question:   Reason for exam-Echo    Answer:   Chemotherapy evaluation  v87.41 /  v58.11    INTERVAL HISTORY: Please see below for problem oriented charting. She returns for Herceptin treatment and follow-up She feels well She has lost a lot of fluid weight since we institute low-dose diuretic therapy She denies nausea, changes in bowel habits or bloating She is active with trying to move her house.  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 A Pap was done 09/02/18 showign ASC-H. She was referred onto GYN and seen by Dr. Paula Compton    09/09/2018 Procedure   She underwent endometrial biopsy   09/12/2018 Pathology Results   Endometrial biopsy positive for adenocarcinoma   09/22/2018 Imaging   Ct abdomen and pelvis showed: Diffuse endometrial thickening, consistent with primary endometrial carcinoma.  Pelvic and abdominal retroperitoneal and retrocrural lymphadenopathy, consistent with metastatic disease.  Moderate hepatic steatosis.   09/25/2018 Cancer Staging   Staging form: Corpus Uteri - Carcinoma and Carcinosarcoma, AJCC 8th Edition - Pathologic: Stage IIIC2 (pT2, pN2, cM0) - Signed by Heath Lark, MD on 02/23/2019   10/01/2018 Procedure   Successful placement of a right IJ approach Power Port with ultrasound and fluoroscopic guidance. The catheter is ready for use.  10/02/2018 Tumor Marker   Patient's tumor was tested for the following markers: CA-125 Results of the tumor marker test revealed 578   10/03/2018 - 01/16/2019 Chemotherapy   The patient had carboplatin and Taxol with dose adjustment due to neuropathy x 6 cycles   10/24/2018 Tumor Marker   Patient's tumor was tested for the following markers: CA-125 Results of the tumor marker test revealed  296    Genetic Testing   Patient has genetic testing done for MSI/MMR. Results revealed MSI stable and MMR normal on Accession [id number].   11/14/2018 Tumor Marker   Patient's tumor was tested for the following markers: CA-125 Results of the tumor marker test revealed 81   11/27/2018 Imaging   1. Significant decrease in abdominal retroperitoneal and bilateral iliac lymphadenopathy since previous study. 2. Significant decrease in abnormal endometrial soft tissue density since prior study. 3. No new or progressive metastatic disease identified.   12/05/2018 Tumor Marker   Patient's tumor was tested for the following markers: CA-125 Results of the tumor marker test revealed 46.3   12/26/2018 Tumor Marker   Patient's tumor was tested for the following markers: CA-125 Results of the tumor marker test revealed 38.2   01/16/2019 Tumor Marker   Patient's tumor was tested for the following markers: CA-125 Results of the tumor marker test revealed 30.8   02/03/2019 Imaging   1. Numerous retroperitoneal, iliac, and pelvic lymph nodes are stable or slightly decreased in size compared to immediate prior examination dated 11/27/2018 and significantly decreased in size compared to exam dated 09/20/2018.  2.  Unchanged thickening of the endometrium.  3. Other chronic, incidental, and postoperative findings as detailed above.   02/12/2019 Imaging   1. Uterus +/- tubes/ovaries, neoplastic, cervix, bilateral fallopian tubes and ovaries - MIXED HIGH-GRADE SEROUS AND ENDOMETRIAL ADENOCARCINOMA, 6.5 CM. SEE NOTE - CARCINOMA INVOLVES ENTIRE ENDOMETRIUM, ANTERIOR AND POSTERIOR LOWER UTERINE SEGMENTS AND ANTERIOR AND POSTERIOR CERVIX. - CARCINOMA INVADES FOR A DEPTH OF 1.9 CM WHERE MYOMETRIAL THICKNESS IS 2.0 CM (GREATER THAN 50% MYOMETRIAL INVASION) - CARCINOMA IS 1 MM FROM THE CERVICAL MARGIN - NEGATIVE FOR LYMPHOVASCULAR OR PERINEURAL INVASION - BILATERAL UNREMARKABLE FALLOPIAN TUBES AND OVARIES,  NEGATIVE FOR CARCINOMA - SEE ONCOLOGY TABLE 2. Lymph node, biopsy, right pelvic - METASTATIC CARCINOMA TO ONE OF TWO LYMPH NODES (1/2) Microscopic Comment 1. (v4.1.0.0) UTERUS, CARCINOMA OR CARCINOSARCOMA Procedure: Total hysterectomy with bilateral salpingo-oophorectomy Histologic type: Mixed high-grade serous and endometrioid adenocarcinoma Histologic Grade: NA Myometrial invasion: Present Depth of invasion: 19 mm Myometrial thickness: 20 mm Uterine Serosa Involvement: Not identified Cervical stromal involvement: Present Extent of involvement of other organs: NA Lymphovascular invasion: Not identified Regional Lymph Nodes: Examined: 0 Sentinel 2 Non-sentinel 2 Total Lymph nodes with metastasis: 1 Isolated tumor cells (< 0.2 mm): 0 Micrometastasis: (> 0.2 mm and < 2.0 mm): 1 Macrometastasis: (> 2.0 mm): 0 Extracapsular extension: Not identified Tumor block for ancillary studies: 73F MMR / MSI testing: Pending Pathologic Stage Classification (pTNM, AJCC 8th edition): pT2, pN66m FIGO Stage: IIIC1 Representative Tumor Block: 1A, 1C (v4.1.0.0) Diagnosis Note 1. Immunohistochemical stains for p16, p53, ER and Ki-67 show a pattern of staining consistent with mixed high-grade serous and endometrial adenocarcinoma. Dr. KLyndon Codehas reviewed this case and concurs with the above interpretation. A molecular study for microsatellite instability (MSI) and immunostains for MMR-related proteins are pending and will be reported in an addendum.   02/12/2019 Surgery   Pre-operative Diagnosis: endometrial cancer stage IIIC2, s/p neoadjuvant chemotherapy, extreme obesity (BMI 53kg/m2)  Operation: Robotic-assisted laparoscopic total hysterectomy with bilateral salpingoophorectomy, right pelvic lymphadenectomy. 22 modifier for extreme obesity  Surgeon: Donaciano Eva  Assistant Surgeon: Lahoma Crocker MD  Operative Findings:  : extreme obesity, BMI 53kg/m2, with significant  retroperitoneal and intraperitoneal adiposity. Obesity necessitated an additional hour of operating time to create safe exposure. Obesity required additional personnel in the operating room for positioning and retraction. Severe obesity substantially increased the complexity of the procedure. 8cm uterus, grossly normal, normal appearing cervix. Retroperitoneal fibrosis consistent with nodal positivity. Clinically suspicious right pelvic lymph node. Unable to completely visualize retroperitoneal nodes due to extreme obesity.    02/12/2019 Pathology Results   IMMUNOHISTOCHEMICAL AND MORPHOMETRIC ANALYSIS PERFORMED MANUALLY The tumor cells are POSITIVE for Her2 (3+). Estrogen Receptor: 70%, POSITIVE, STRONG STAINING INTENSITY Progesterone Receptor: 70%, POSITIVE, STRONG STAINING INTENSITY Proliferation Marker Ki67: 20% REFERENCE RANGE ESTROGEN RECEPTOR NEGATIVE 0% POSITIVE =>1% REFERENCE RANGE PROGESTERONE RECEPTOR NEGATIVE 0% POSITIVE =>1% All controls stained appropriately  1. Uterus +/- tubes/ovaries, neoplastic, cervix, bilateral fallopian tubes and ovaries - MIXED HIGH-GRADE SEROUS AND ENDOMETRIAL ADENOCARCINOMA, 6.5 CM. SEE NOTE - CARCINOMA INVOLVES ENTIRE ENDOMETRIUM, ANTERIOR AND POSTERIOR LOWER UTERINE SEGMENTS AND ANTERIOR AND POSTERIOR CERVIX. - CARCINOMA INVADES FOR A DEPTH OF 1.9 CM WHERE MYOMETRIAL THICKNESS IS 2.0 CM (GREATER THAN 50% MYOMETRIAL INVASION) - CARCINOMA IS 1 MM FROM THE CERVICAL MARGIN - NEGATIVE FOR LYMPHOVASCULAR OR PERINEURAL INVASION - BILATERAL UNREMARKABLE FALLOPIAN TUBES AND OVARIES, NEGATIVE FOR CARCINOMA - SEE ONCOLOGY TABLE 2. Lymph node, biopsy, right pelvic - METASTATIC CARCINOMA TO ONE OF TWO LYMPH NODES (1/2) Microscopic Comment 1. (v4.1.0.0) UTERUS, CARCINOMA OR CARCINOSARCOMA Procedure: Total hysterectomy with bilateral salpingo-oophorectomy Histologic type: Mixed high-grade serous and endometrioid adenocarcinoma Histologic Grade: NA Myometrial  invasion: Present Depth of invasion: 19 mm Myometrial thickness: 20 mm Uterine Serosa Involvement: Not identified Cervical stromal involvement: Present Extent of involvement of other organs: NA Lymphovascular invasion: Not identified Regional Lymph Nodes: Examined: 0 Sentinel 2 Non-sentinel 2 Total Lymph nodes with metastasis: 1 Isolated tumor cells (< 0.2 mm): 0 Micrometastasis: (> 0.2 mm and < 2.0 mm): 1 Macrometastasis: (> 2.0 mm): 0 Extracapsular extension: Not identified Tumor block for ancillary studies: 76F MMR / MSI testing: Pending Pathologic Stage Classification (pTNM, AJCC 8th edition): pT2, pN9m FIGO Stage: IIIC1 Representative Tumor Block: 1A, 1C   03/17/2019 Echocardiogram   1. The left ventricle has normal systolic function, with an ejection fraction of 55-60%. The cavity size was normal. There is moderate concentric left ventricular hypertrophy. Left ventricular diastolic Doppler parameters are consistent with impaired relaxation.  2. The right ventricle has normal systolic function. The cavity was normal. There is no increase in right ventricular wall thickness.  3. GLS: -15.5%, no prior study available for comparison.   03/17/2019 Tumor Marker   Patient's tumor was tested for the following markers: CA-125 Results of the tumor marker test revealed 43   03/20/2019 -  Chemotherapy   The patient had trastuzumab (HERCEPTIN) 693 mg in sodium chloride 0.9 % 250 mL chemo infusion, 8 mg/kg = 693 mg, Intravenous,  Once, 5 of 5 cycles Dose modification: 6 mg/kg (original dose 6 mg/kg, Cycle 2, Reason: Provider Judgment) Administration: 693 mg (03/20/2019), 504 mg (04/10/2019), 504 mg (05/01/2019), 504 mg (05/21/2019), 504 mg (06/10/2019) trastuzumab-anns (KANJINTI) 504 mg in sodium chloride 0.9 % 250 mL chemo infusion, 6 mg/kg = 504 mg (100 % of original dose 6 mg/kg), Intravenous,  Once, 3 of 4 cycles Dose modification: 6 mg/kg (original dose 6 mg/kg,  Cycle 6, Reason: Other (see  comments), Comment: biosimilar conversion) Administration: 504 mg (07/02/2019), 504 mg (07/23/2019)  for chemotherapy treatment.    04/30/2019 Tumor Marker   Patient's tumor was tested for the following markers:CA-125 Results of the tumor marker test revealed 29   05/20/2019 Imaging   1. No evidence of metastatic disease. 2. Small pelvic and left paracolic gutter ascites, new. 3. Aortic atherosclerosis (ICD10-170.0). Coronary artery calcification.     05/26/2019 Echocardiogram      1. The left ventricle has normal systolic function with an ejection fraction of 60-65%. The cavity size was normal. There is mildly increased left ventricular wall thickness. Left ventricular diastolic parameters were normal.  2. GLS -16.4% (underestimated due to poor endocardial tracking).  3. The right ventricle has normal systolic function. The cavity was normal. There is no increase in right ventricular wall thickness.  4. Mild calcification of the mitral valve leaflet.  5. Mild calcification of the aortic valve. No stenosis of the aortic valve.  6. The aorta is normal in size and structure.     REVIEW OF SYSTEMS:   Constitutional: Denies fevers, chills or abnormal weight loss Eyes: Denies blurriness of vision Ears, nose, mouth, throat, and face: Denies mucositis or sore throat Respiratory: Denies cough, dyspnea or wheezes Cardiovascular: Denies palpitation, chest discomfort or lower extremity swelling Gastrointestinal:  Denies nausea, heartburn or change in bowel habits Skin: Denies abnormal skin rashes Lymphatics: Denies new lymphadenopathy or easy bruising Neurological:Denies numbness, tingling or new weaknesses Behavioral/Psych: Mood is stable, no new changes  All other systems were reviewed with the patient and are negative.  I have reviewed the past medical history, past surgical history, social history and family history with the patient and they are unchanged from previous note.  ALLERGIES:   has No Known Allergies.  MEDICATIONS:  Current Outpatient Medications  Medication Sig Dispense Refill  . diphenhydrAMINE (BENADRYL) 25 mg capsule Take 100 mg by mouth 2 (two) times a day.    . furosemide (LASIX) 20 MG tablet Take 1 tablet (20 mg total) by mouth daily. 30 tablet 1  . gabapentin (NEURONTIN) 400 MG capsule Take 3 capsules (1,200 mg total) by mouth 3 (three) times daily. 270 capsule 1  . lidocaine-prilocaine (EMLA) cream Apply to affected area once 30 g 3  . losartan-hydrochlorothiazide (HYZAAR) 50-12.5 MG tablet Take 2 tablets by mouth daily.    . ondansetron (ZOFRAN) 8 MG tablet Take 8 mg by mouth every 8 (eight) hours as needed.    Marland Kitchen oxyCODONE-acetaminophen (PERCOCET) 10-325 MG tablet Take 1-2 tablets by mouth every 6 (six) hours as needed for pain.    Marland Kitchen prochlorperazine (COMPAZINE) 10 MG tablet TAKE 1 TABLET BY MOUTH EVERY 6 HOURS AS NEEDED FOR NAUSEA OR VOMITING 30 tablet 1   No current facility-administered medications for this visit.    Facility-Administered Medications Ordered in Other Visits  Medication Dose Route Frequency Provider Last Rate Last Dose  . 0.9 %  sodium chloride infusion   Intravenous Once Alvy Bimler, Kieryn Burtis, MD      . acetaminophen (TYLENOL) tablet 650 mg  650 mg Oral Once Alvy Bimler, Devina Bezold, MD      . trastuzumab-anns [facility]) 504 mg in sodium chloride 0.9 % 250 mL chemo infusion  6 mg/kg (Treatment Plan Adjusted) Intravenous Once Alvy Bimler, Kingslee Mairena, MD        PHYSICAL EXAMINATION: ECOG PERFORMANCE STATUS: 1 - Symptomatic but completely ambulatory  Vitals:   08/13/19 0839  BP: (!) 142/70  Pulse: 70  Resp: 18  Temp: 97.8 F (36.6 C)  SpO2: 98%   Filed Weights   08/13/19 0839  Weight: 296 lb 12.8 oz (134.6 kg)    GENERAL:alert, no distress and comfortable SKIN: skin color, texture, turgor are normal, no rashes or significant lesions EYES: normal, Conjunctiva are pink and non-injected, sclera clear OROPHARYNX:no exudate, no erythema and lips, buccal  mucosa, and tongue normal  NECK: supple, thyroid normal size, non-tender, without nodularity LYMPH:  no palpable lymphadenopathy in the cervical, axillary or inguinal LUNGS: clear to auscultation and percussion with normal breathing effort HEART: regular rate & rhythm and no murmurs with mild bilateral lower extremity edema ABDOMEN:abdomen soft, non-tender and normal bowel sounds Musculoskeletal:no cyanosis of digits and no clubbing  NEURO: alert & oriented x 3 with fluent speech, no focal motor/sensory deficits  LABORATORY DATA:  I have reviewed the data as listed    Component Value Date/Time   NA 141 08/13/2019 0814   K 3.2 (L) 08/13/2019 0814   CL 101 08/13/2019 0814   CO2 27 08/13/2019 0814   GLUCOSE 115 (H) 08/13/2019 0814   BUN 17 08/13/2019 0814   CREATININE 0.86 08/13/2019 0814   CALCIUM 9.1 08/13/2019 0814   PROT 7.4 08/13/2019 0814   ALBUMIN 4.0 08/13/2019 0814   AST 20 08/13/2019 0814   ALT 18 08/13/2019 0814   ALKPHOS 91 08/13/2019 0814   BILITOT 0.4 08/13/2019 0814   GFRNONAA >60 08/13/2019 0814   GFRAA >60 08/13/2019 0814    No results found for: SPEP, UPEP  Lab Results  Component Value Date   WBC 4.3 08/13/2019   NEUTROABS 2.1 08/13/2019   HGB 12.0 08/13/2019   HCT 36.9 08/13/2019   MCV 101.7 (H) 08/13/2019   PLT 174 08/13/2019      Chemistry      Component Value Date/Time   NA 141 08/13/2019 0814   K 3.2 (L) 08/13/2019 0814   CL 101 08/13/2019 0814   CO2 27 08/13/2019 0814   BUN 17 08/13/2019 0814   CREATININE 0.86 08/13/2019 0814      Component Value Date/Time   CALCIUM 9.1 08/13/2019 0814   ALKPHOS 91 08/13/2019 0814   AST 20 08/13/2019 0814   ALT 18 08/13/2019 0814   BILITOT 0.4 08/13/2019 0814       All questions were answered. The patient knows to call the clinic with any problems, questions or concerns. No barriers to learning was detected.  I spent 15 minutes counseling the patient face to face. The total time spent in the  appointment was 20 minutes and more than 50% was on counseling and review of test results  Heath Lark, MD 08/13/2019 9:30 AM

## 2019-09-02 ENCOUNTER — Encounter (HOSPITAL_COMMUNITY): Payer: Self-pay

## 2019-09-02 ENCOUNTER — Ambulatory Visit (HOSPITAL_COMMUNITY)
Admission: RE | Admit: 2019-09-02 | Discharge: 2019-09-02 | Disposition: A | Discharge status: Home / Self Care | Payer: Medicare Other | Source: Ambulatory Visit | Attending: Hematology and Oncology | Admitting: Hematology and Oncology

## 2019-09-02 ENCOUNTER — Other Ambulatory Visit: Payer: Self-pay

## 2019-09-02 DIAGNOSIS — C541 Malignant neoplasm of endometrium: Secondary | ICD-10-CM | POA: Insufficient documentation

## 2019-09-02 DIAGNOSIS — Z5111 Encounter for antineoplastic chemotherapy: Secondary | ICD-10-CM | POA: Diagnosis not present

## 2019-09-02 DIAGNOSIS — R600 Localized edema: Secondary | ICD-10-CM | POA: Diagnosis not present

## 2019-09-02 MED ORDER — IOHEXOL 300 MG/ML  SOLN
100.0000 mL | Freq: Once | INTRAMUSCULAR | Status: AC | PRN
  Administered 2019-09-02: 100 mL via INTRAVENOUS

## 2019-09-02 MED ORDER — HEPARIN SOD (PORK) LOCK FLUSH 100 UNIT/ML IV SOLN
500.0000 [IU] | Freq: Once | INTRAVENOUS | Status: AC
  Administered 2019-09-02: 500 [IU] via INTRAVENOUS

## 2019-09-02 MED ORDER — SODIUM CHLORIDE (PF) 0.9 % IJ SOLN
INTRAMUSCULAR | Status: AC
  Filled 2019-09-02: qty 50

## 2019-09-02 MED ORDER — HEPARIN SOD (PORK) LOCK FLUSH 100 UNIT/ML IV SOLN
INTRAVENOUS | Status: AC
  Filled 2019-09-02: qty 5

## 2019-09-02 NOTE — Progress Notes (Signed)
  Echocardiogram 2D Echocardiogram has been performed.  Jennette Dubin 09/02/2019, 9:31 AM

## 2019-09-03 ENCOUNTER — Encounter: Payer: Self-pay | Admitting: Hematology and Oncology

## 2019-09-03 ENCOUNTER — Inpatient Hospital Stay: Payer: Medicare Other

## 2019-09-03 ENCOUNTER — Inpatient Hospital Stay (HOSPITAL_BASED_OUTPATIENT_CLINIC_OR_DEPARTMENT_OTHER): Payer: Medicare Other | Admitting: Hematology and Oncology

## 2019-09-03 ENCOUNTER — Telehealth: Payer: Self-pay | Admitting: Hematology and Oncology

## 2019-09-03 ENCOUNTER — Inpatient Hospital Stay: Payer: Medicare Other | Attending: Hematology and Oncology

## 2019-09-03 ENCOUNTER — Other Ambulatory Visit: Payer: Self-pay

## 2019-09-03 DIAGNOSIS — D61818 Other pancytopenia: Secondary | ICD-10-CM | POA: Insufficient documentation

## 2019-09-03 DIAGNOSIS — R634 Abnormal weight loss: Secondary | ICD-10-CM | POA: Insufficient documentation

## 2019-09-03 DIAGNOSIS — C541 Malignant neoplasm of endometrium: Secondary | ICD-10-CM

## 2019-09-03 DIAGNOSIS — Z5112 Encounter for antineoplastic immunotherapy: Secondary | ICD-10-CM | POA: Diagnosis not present

## 2019-09-03 DIAGNOSIS — Z79899 Other long term (current) drug therapy: Secondary | ICD-10-CM | POA: Diagnosis not present

## 2019-09-03 DIAGNOSIS — I10 Essential (primary) hypertension: Secondary | ICD-10-CM | POA: Insufficient documentation

## 2019-09-03 DIAGNOSIS — G629 Polyneuropathy, unspecified: Secondary | ICD-10-CM | POA: Insufficient documentation

## 2019-09-03 DIAGNOSIS — G63 Polyneuropathy in diseases classified elsewhere: Secondary | ICD-10-CM | POA: Diagnosis not present

## 2019-09-03 DIAGNOSIS — K5909 Other constipation: Secondary | ICD-10-CM | POA: Insufficient documentation

## 2019-09-03 DIAGNOSIS — Z9221 Personal history of antineoplastic chemotherapy: Secondary | ICD-10-CM | POA: Diagnosis not present

## 2019-09-03 LAB — CBC WITH DIFFERENTIAL (CANCER CENTER ONLY)
Abs Immature Granulocytes: 0.01 10*3/uL (ref 0.00–0.07)
Basophils Absolute: 0 10*3/uL (ref 0.0–0.1)
Basophils Relative: 0 %
Eosinophils Absolute: 0 10*3/uL (ref 0.0–0.5)
Eosinophils Relative: 1 %
HCT: 36.2 % (ref 36.0–46.0)
Hemoglobin: 11.5 g/dL — ABNORMAL LOW (ref 12.0–15.0)
Immature Granulocytes: 0 %
Lymphocytes Relative: 39 %
Lymphs Abs: 1.5 10*3/uL (ref 0.7–4.0)
MCH: 32.2 pg (ref 26.0–34.0)
MCHC: 31.8 g/dL (ref 30.0–36.0)
MCV: 101.4 fL — ABNORMAL HIGH (ref 80.0–100.0)
Monocytes Absolute: 0.4 10*3/uL (ref 0.1–1.0)
Monocytes Relative: 10 %
Neutro Abs: 2 10*3/uL (ref 1.7–7.7)
Neutrophils Relative %: 50 %
Platelet Count: 178 10*3/uL (ref 150–400)
RBC: 3.57 MIL/uL — ABNORMAL LOW (ref 3.87–5.11)
RDW: 13.5 % (ref 11.5–15.5)
WBC Count: 3.9 10*3/uL — ABNORMAL LOW (ref 4.0–10.5)
nRBC: 0 % (ref 0.0–0.2)

## 2019-09-03 LAB — CMP (CANCER CENTER ONLY)
ALT: 17 U/L (ref 0–44)
AST: 17 U/L (ref 15–41)
Albumin: 3.9 g/dL (ref 3.5–5.0)
Alkaline Phosphatase: 96 U/L (ref 38–126)
Anion gap: 10 (ref 5–15)
BUN: 15 mg/dL (ref 8–23)
CO2: 32 mmol/L (ref 22–32)
Calcium: 8.8 mg/dL — ABNORMAL LOW (ref 8.9–10.3)
Chloride: 99 mmol/L (ref 98–111)
Creatinine: 0.84 mg/dL (ref 0.44–1.00)
GFR, Est AFR Am: >60 mL/min (ref >60)
GFR, Estimated: >60 mL/min (ref >60)
Glucose, Bld: 97 mg/dL (ref 70–99)
Potassium: 3.3 mmol/L — ABNORMAL LOW (ref 3.5–5.1)
Sodium: 141 mmol/L (ref 135–145)
Total Bilirubin: 0.3 mg/dL (ref 0.3–1.2)
Total Protein: 7.1 g/dL (ref 6.5–8.1)

## 2019-09-03 MED ORDER — ACETAMINOPHEN 325 MG PO TABS: 650.0000 mg | ORAL_TABLET | Freq: Once | ORAL | Status: DC

## 2019-09-03 MED ORDER — SODIUM CHLORIDE 0.9 % IV SOLN
Freq: Once | INTRAVENOUS | Status: AC
  Administered 2019-09-03: 12:00:00 via INTRAVENOUS
  Filled 2019-09-03: qty 250

## 2019-09-03 MED ORDER — DIPHENHYDRAMINE HCL 25 MG PO CAPS: 25.0000 mg | ORAL_CAPSULE | Freq: Once | ORAL | Status: DC

## 2019-09-03 MED ORDER — SODIUM CHLORIDE 0.9% FLUSH
10.0000 mL | INTRAVENOUS | Status: DC | PRN
  Administered 2019-09-03: 13:00:00 10 mL
  Filled 2019-09-03: qty 10

## 2019-09-03 MED ORDER — HEPARIN SOD (PORK) LOCK FLUSH 100 UNIT/ML IV SOLN
500.0000 [IU] | Freq: Once | INTRAVENOUS | Status: AC | PRN
  Administered 2019-09-03: 500 [IU]
  Filled 2019-09-03: qty 5

## 2019-09-03 MED ORDER — GABAPENTIN 400 MG PO CAPS: 1200.0000 mg | ORAL_CAPSULE | Freq: Three times a day (TID) | ORAL | 11 refills | Status: AC

## 2019-09-03 MED ORDER — TRASTUZUMAB-ANNS CHEMO 150 MG IV SOLR
6.0000 mg/kg | Freq: Once | INTRAVENOUS | Status: AC
  Administered 2019-09-03: 13:00:00 504 mg via INTRAVENOUS
  Filled 2019-09-03: qty 24

## 2019-09-03 MED ORDER — SODIUM CHLORIDE 0.9% FLUSH
10.0000 mL | Freq: Once | INTRAVENOUS | Status: AC
  Administered 2019-09-03: 10 mL
  Filled 2019-09-03: qty 10

## 2019-09-03 NOTE — Assessment & Plan Note (Signed)
She has signs of constipation on CT imaging I recommend aggressive laxative therapy

## 2019-09-03 NOTE — Patient Instructions (Signed)
[facility] Discharge Instructions for Patients Receiving Chemotherapy  Today you received the following chemotherapy agents Trastuzumab-anns  To help prevent nausea and vomiting after your treatment, we encourage you to take your nausea medication as directed.   If you develop nausea and vomiting that is not controlled by your nausea medication, call the clinic.   BELOW ARE SYMPTOMS THAT SHOULD BE REPORTED IMMEDIATELY:  *FEVER GREATER THAN 100.5 F  *CHILLS WITH OR WITHOUT FEVER  NAUSEA AND VOMITING THAT IS NOT CONTROLLED WITH YOUR NAUSEA MEDICATION  *UNUSUAL SHORTNESS OF BREATH  *UNUSUAL BRUISING OR BLEEDING  TENDERNESS IN MOUTH AND THROAT WITH OR WITHOUT PRESENCE OF ULCERS  *URINARY PROBLEMS  *BOWEL PROBLEMS  UNUSUAL RASH Items with * indicate a potential emergency and should be followed up as soon as possible.  Feel free to call the clinic should you have any questions or concerns. The clinic phone number is [phone].  Please show the CHEMO ALERT CARD at check-in to the Emergency Department and triage nurse.   

## 2019-09-03 NOTE — Telephone Encounter (Signed)
Scheduled appt per 11/19 sch message - pt to get an updated schedule in the treatment area. Per RN Molly aware to print out schedule.

## 2019-09-03 NOTE — Progress Notes (Signed)
[place] OFFICE PROGRESS NOTE  Patient Care Team: Ronita Hipps, MD as PCP - General (Family Medicine) Nicholaus Bloom, MD (Anesthesiology)  ASSESSMENT & PLAN:  Endometrial cancer [facility]) I have reviewed multiple imaging studies with the patient She has no signs of cancer recurrence Echocardiogram is normal We will continue Herceptin indefinitely.  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   INTERVAL HISTORY: Please see below for problem oriented charting. 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 A Pap was done 09/02/18 showign ASC-[facility]. She was referred onto GYN and seen by Dr. Paula Compton    09/09/2018 Procedure   She underwent endometrial biopsy   09/12/2018 Pathology Results   Endometrial biopsy positive for adenocarcinoma   09/22/2018 Imaging   Ct abdomen and pelvis showed: Diffuse endometrial thickening, consistent with primary endometrial carcinoma.  Pelvic and abdominal retroperitoneal and retrocrural lymphadenopathy, consistent with metastatic disease.  Moderate hepatic steatosis.   09/25/2018 Cancer  Staging   Staging form: Corpus Uteri - Carcinoma and Carcinosarcoma, AJCC 8th Edition - Pathologic: Stage IIIC2 (pT2, pN2, cM0) - Signed by Heath Lark, MD on 02/23/2019   10/01/2018 Procedure   Successful placement of a right IJ approach Power Port with ultrasound and fluoroscopic guidance. The catheter is ready for use.   10/02/2018 Tumor Marker   Patient's tumor was tested for the following markers: CA-125 Results of the tumor marker test revealed 578   10/03/2018 - 01/16/2019 Chemotherapy   The patient had carboplatin and Taxol with dose adjustment due to neuropathy x 6 cycles   10/24/2018 Tumor Marker   Patient's tumor was tested for the following markers: CA-125 Results of the tumor marker test revealed 296    Genetic Testing   Patient has genetic testing done for MSI/MMR. Results revealed MSI stable and MMR normal on Accession [id number].   11/14/2018 Tumor Marker   Patient's tumor was tested for the following markers: CA-125 Results of the tumor marker test revealed 81   11/27/2018 Imaging   1. Significant decrease in abdominal retroperitoneal and bilateral iliac lymphadenopathy since previous study. 2. Significant decrease in abnormal endometrial soft tissue density since prior study. 3. No new or progressive metastatic disease identified.   12/05/2018 Tumor Marker   Patient's tumor was tested for the following markers: CA-125 Results of the tumor marker test revealed 46.3   12/26/2018 Tumor Marker   Patient's tumor was tested for the following markers: CA-125 Results of the tumor marker test revealed 38.2   01/16/2019 Tumor Marker   Patient's tumor was tested for the following markers: CA-125 Results of the tumor marker test revealed 30.8   02/03/2019 Imaging   1. Numerous retroperitoneal, iliac, and pelvic lymph nodes  are stable or slightly decreased in size compared to immediate prior examination dated 11/27/2018 and significantly decreased in size compared to exam dated  09/20/2018.  2.  Unchanged thickening of the endometrium.  3. Other chronic, incidental, and postoperative findings as detailed above.   02/12/2019 Imaging   1. Uterus +/- tubes/ovaries, neoplastic, cervix, bilateral fallopian tubes and ovaries - MIXED HIGH-GRADE SEROUS AND ENDOMETRIAL ADENOCARCINOMA, 6.5 CM. SEE NOTE - CARCINOMA INVOLVES ENTIRE ENDOMETRIUM, ANTERIOR AND POSTERIOR LOWER UTERINE SEGMENTS AND ANTERIOR AND POSTERIOR CERVIX. - CARCINOMA INVADES FOR A DEPTH OF 1.9 CM WHERE MYOMETRIAL THICKNESS IS 2.0 CM (GREATER THAN 50% MYOMETRIAL INVASION) - CARCINOMA IS 1 MM FROM THE CERVICAL MARGIN - NEGATIVE FOR LYMPHOVASCULAR OR PERINEURAL INVASION - BILATERAL UNREMARKABLE FALLOPIAN TUBES AND OVARIES, NEGATIVE FOR CARCINOMA - SEE ONCOLOGY TABLE 2. Lymph node, biopsy, right pelvic - METASTATIC CARCINOMA TO ONE OF TWO LYMPH NODES (1/2) Microscopic Comment 1. (v4.1.0.0) UTERUS, CARCINOMA OR CARCINOSARCOMA Procedure: Total hysterectomy with bilateral salpingo-oophorectomy Histologic type: Mixed high-grade serous and endometrioid adenocarcinoma Histologic Grade: NA Myometrial invasion: Present Depth of invasion: 19 mm Myometrial thickness: 20 mm Uterine Serosa Involvement: Not identified Cervical stromal involvement: Present Extent of involvement of other organs: NA Lymphovascular invasion: Not identified Regional Lymph Nodes: Examined: 0 Sentinel 2 Non-sentinel 2 Total Lymph nodes with metastasis: 1 Isolated tumor cells (< 0.2 mm): 0 Micrometastasis: (> 0.2 mm and < 2.0 mm): 1 Macrometastasis: (> 2.0 mm): 0 Extracapsular extension: Not identified Tumor block for ancillary studies: 31F MMR / MSI testing: Pending Pathologic Stage Classification (pTNM, AJCC 8th edition): pT2, pN64m FIGO Stage: IIIC1 Representative Tumor Block: 1A, 1C (v4.1.0.0) Diagnosis Note 1. Immunohistochemical stains for p16, p53, ER and Ki-67 show a pattern of staining consistent with mixed high-grade  serous and endometrial adenocarcinoma. Dr. KLyndon Codehas reviewed this case and concurs with the above interpretation. A molecular study for microsatellite instability (MSI) and immunostains for MMR-related proteins are pending and will be reported in an addendum.   02/12/2019 Surgery   Pre-operative Diagnosis: endometrial cancer stage IIIC2, s/p neoadjuvant chemotherapy, extreme obesity (BMI 53kg/m2)  Operation: Robotic-assisted laparoscopic total hysterectomy with bilateral salpingoophorectomy, right pelvic lymphadenectomy. 22 modifier for extreme obesity  Surgeon: RDonaciano Eva Assistant Surgeon: LLahoma CrockerMD  Operative Findings:  : extreme obesity, BMI 53kg/m2, with significant retroperitoneal and intraperitoneal adiposity. Obesity necessitated an additional hour of operating time to create safe exposure. Obesity required additional personnel in the operating room for positioning and retraction. Severe obesity substantially increased the complexity of the procedure. 8cm uterus, grossly normal, normal appearing cervix. Retroperitoneal fibrosis consistent with nodal positivity. Clinically suspicious right pelvic lymph node. Unable to completely visualize retroperitoneal nodes due to extreme obesity.    02/12/2019 Pathology Results   IMMUNOHISTOCHEMICAL AND MORPHOMETRIC ANALYSIS PERFORMED MANUALLY The tumor cells are POSITIVE for Her2 (3+). Estrogen Receptor: 70%, POSITIVE, STRONG STAINING INTENSITY Progesterone Receptor: 70%, POSITIVE, STRONG STAINING INTENSITY Proliferation Marker Ki67: 20% REFERENCE RANGE ESTROGEN RECEPTOR NEGATIVE 0% POSITIVE =>1% REFERENCE RANGE PROGESTERONE RECEPTOR NEGATIVE 0% POSITIVE =>1% All controls stained appropriately  1. Uterus +/- tubes/ovaries, neoplastic, cervix, bilateral fallopian tubes and ovaries - MIXED HIGH-GRADE SEROUS AND ENDOMETRIAL ADENOCARCINOMA, 6.5 CM. SEE NOTE - CARCINOMA INVOLVES ENTIRE ENDOMETRIUM, ANTERIOR AND POSTERIOR  LOWER UTERINE SEGMENTS AND ANTERIOR AND POSTERIOR CERVIX. - CARCINOMA INVADES FOR A DEPTH OF 1.9 CM WHERE MYOMETRIAL THICKNESS IS 2.0 CM (GREATER THAN 50% MYOMETRIAL INVASION) - CARCINOMA IS 1 MM FROM THE CERVICAL MARGIN - NEGATIVE FOR LYMPHOVASCULAR OR PERINEURAL INVASION -  BILATERAL UNREMARKABLE FALLOPIAN TUBES AND OVARIES, NEGATIVE FOR CARCINOMA - SEE ONCOLOGY TABLE 2. Lymph node, biopsy, right pelvic - METASTATIC CARCINOMA TO ONE OF TWO LYMPH NODES (1/2) Microscopic Comment 1. (v4.1.0.0) UTERUS, CARCINOMA OR CARCINOSARCOMA Procedure: Total hysterectomy with bilateral salpingo-oophorectomy Histologic type: Mixed high-grade serous and endometrioid adenocarcinoma Histologic Grade: NA Myometrial invasion: Present Depth of invasion: 19 mm Myometrial thickness: 20 mm Uterine Serosa Involvement: Not identified Cervical stromal involvement: Present Extent of involvement of other organs: NA Lymphovascular invasion: Not identified Regional Lymph Nodes: Examined: 0 Sentinel 2 Non-sentinel 2 Total Lymph nodes with metastasis: 1 Isolated tumor cells (< 0.2 mm): 0 Micrometastasis: (> 0.2 mm and < 2.0 mm): 1 Macrometastasis: (> 2.0 mm): 0 Extracapsular extension: Not identified Tumor block for ancillary studies: 59F MMR / MSI testing: Pending Pathologic Stage Classification (pTNM, AJCC 8th edition): pT2, pN75m FIGO Stage: IIIC1 Representative Tumor Block: 1A, 1C   03/17/2019 Echocardiogram   1. The left ventricle has normal systolic function, with an ejection fraction of 55-60%. The cavity size was normal. There is moderate concentric left ventricular hypertrophy. Left ventricular diastolic Doppler parameters are consistent with impaired relaxation.  2. The right ventricle has normal systolic function. The cavity was normal. There is no increase in right ventricular wall thickness.  3. GLS: -15.5%, no prior study available for comparison.   03/17/2019 Tumor Marker   Patient's tumor was  tested for the following markers: CA-125 Results of the tumor marker test revealed 43   03/20/2019 -  Chemotherapy   The patient had trastuzumab (HERCEPTIN) 693 mg in sodium chloride 0.9 % 250 mL chemo infusion, 8 mg/kg = 693 mg, Intravenous,  Once, 5 of 5 cycles Dose modification: 6 mg/kg (original dose 6 mg/kg, Cycle 2, Reason: Provider Judgment) Administration: 693 mg (03/20/2019), 504 mg (04/10/2019), 504 mg (05/01/2019), 504 mg (05/21/2019), 504 mg (06/10/2019) trastuzumab-anns (KANJINTI) 504 mg in sodium chloride 0.9 % 250 mL chemo infusion, 6 mg/kg = 504 mg (100 % of original dose 6 mg/kg), Intravenous,  Once, 3 of 6 cycles Dose modification: 6 mg/kg (original dose 6 mg/kg, Cycle 6, Reason: Other (see comments), Comment: biosimilar conversion) Administration: 504 mg (07/02/2019), 504 mg (07/23/2019), 504 mg (08/13/2019)  for chemotherapy treatment.    04/30/2019 Tumor Marker   Patient's tumor was tested for the following markers:CA-125 Results of the tumor marker test revealed 29   05/20/2019 Imaging   1. No evidence of metastatic disease. 2. Small pelvic and left paracolic gutter ascites, new. 3. Aortic atherosclerosis (ICD10-170.0). Coronary artery calcification.     05/26/2019 Echocardiogram      1. The left ventricle has normal systolic function with an ejection fraction of 60-65%. The cavity size was normal. There is mildly increased left ventricular wall thickness. Left ventricular diastolic parameters were normal.  2. GLS -16.4% (underestimated due to poor endocardial tracking).  3. The right ventricle has normal systolic function. The cavity was normal. There is no increase in right ventricular wall thickness.  4. Mild calcification of the mitral valve leaflet.  5. Mild calcification of the aortic valve. No stenosis of the aortic valve.  6. The aorta is normal in size and structure.   09/02/2019 Imaging   No evidence of recurrent or metastatic carcinoma within the abdomen or pelvis.    Colonic diverticulosis. No radiographic evidence of diverticulitis.   Increased large stool burden noted; recommend clinical correlation for possible constipation.   09/02/2019 Echocardiogram   1. Left ventricular ejection fraction, by visual estimation, is 60 to  65%. The left ventricle has normal function. There is no left ventricular hypertrophy.  2. Global right ventricle has normal systolic function.The right ventricular size is normal. No increase in right ventricular wall thickness.  3. Left atrial size was normal.  4. Right atrial size was normal.  5. The mitral valve is normal in structure. No evidence of mitral valve regurgitation.  6. The tricuspid valve is normal in structure. Tricuspid valve regurgitation is trivial.  7. The aortic valve is normal in structure. Aortic valve regurgitation is not visualized.  8. The pulmonic valve was normal in structure. Pulmonic valve regurgitation is trivial.  9. Mildly elevated pulmonary artery systolic pressure. 10. The average left ventricular global longitudinal strain is -26.9 %.     REVIEW OF SYSTEMS:   Constitutional: Denies fevers, chills or abnormal weight loss Eyes: Denies blurriness of vision Ears, nose, mouth, throat, and face: Denies mucositis or sore throat Respiratory: Denies cough, dyspnea or wheezes Cardiovascular: Denies palpitation, chest discomfort or lower extremity swelling Gastrointestinal:  Denies nausea, heartburn or change in bowel habits Skin: Denies abnormal skin rashes Lymphatics: Denies new lymphadenopathy or easy bruising Neurological:Denies numbness, tingling or new weaknesses Behavioral/Psych: Mood is stable, no new changes  All other systems were reviewed with the patient and are negative.  I have reviewed the past medical history, past surgical history, social history and family history with the patient and they are unchanged from previous note.  ALLERGIES:  is allergic to tegaderm ag mesh  [silver].  MEDICATIONS:  Current Outpatient Medications  Medication Sig Dispense Refill  . diphenhydrAMINE (BENADRYL) 25 mg capsule Take 100 mg by mouth 2 (two) times a day.    . furosemide (LASIX) 20 MG tablet Take 1 tablet (20 mg total) by mouth daily. 30 tablet 1  . gabapentin (NEURONTIN) 400 MG capsule Take 3 capsules (1,200 mg total) by mouth 3 (three) times daily. 270 capsule 11  . lidocaine-prilocaine (EMLA) cream Apply to affected area once 30 g 3  . losartan-hydrochlorothiazide (HYZAAR) 50-12.5 MG tablet Take 2 tablets by mouth daily.    . ondansetron (ZOFRAN) 8 MG tablet Take 8 mg by mouth every 8 (eight) hours as needed.    Marland Kitchen oxyCODONE-acetaminophen (PERCOCET) 10-325 MG tablet Take 1-2 tablets by mouth every 6 (six) hours as needed for pain.    Marland Kitchen prochlorperazine (COMPAZINE) 10 MG tablet TAKE 1 TABLET BY MOUTH EVERY 6 HOURS AS NEEDED FOR NAUSEA OR VOMITING 30 tablet 1   No current facility-administered medications for this visit.     PHYSICAL EXAMINATION: ECOG PERFORMANCE STATUS: 1 - Symptomatic but completely ambulatory  Vitals:   09/03/19 1115  BP: (!) 145/96  Pulse: 64  Resp: 18  Temp: 98.9 F (37.2 C)  SpO2: 100%   Filed Weights   09/03/19 1115  Weight: 292 lb 3.2 oz (132.5 kg)    GENERAL:alert, no distress and comfortable SKIN: skin color, texture, turgor are normal, no rashes or significant lesions EYES: normal, Conjunctiva are pink and non-injected, sclera clear OROPHARYNX:no exudate, no erythema and lips, buccal mucosa, and tongue normal  NECK: supple, thyroid normal size, non-tender, without nodularity LYMPH:  no palpable lymphadenopathy in the cervical, axillary or inguinal LUNGS: clear to auscultation and percussion with normal breathing effort HEART: regular rate & rhythm and no murmurs and no lower extremity edema ABDOMEN:abdomen soft, non-tender and normal bowel sounds Musculoskeletal:no cyanosis of digits and no clubbing  NEURO: alert & oriented x  3 with fluent speech, no focal motor/sensory deficits  LABORATORY DATA:  I have reviewed the data as listed    Component Value Date/Time   NA 141 09/03/2019 1016   K 3.3 (L) 09/03/2019 1016   CL 99 09/03/2019 1016   CO2 32 09/03/2019 1016   GLUCOSE 97 09/03/2019 1016   BUN 15 09/03/2019 1016   CREATININE 0.84 09/03/2019 1016   CALCIUM 8.8 (L) 09/03/2019 1016   PROT 7.1 09/03/2019 1016   ALBUMIN 3.9 09/03/2019 1016   AST 17 09/03/2019 1016   ALT 17 09/03/2019 1016   ALKPHOS 96 09/03/2019 1016   BILITOT 0.3 09/03/2019 1016   GFRNONAA >60 09/03/2019 1016   GFRAA >60 09/03/2019 1016    No results found for: SPEP, UPEP  Lab Results  Component Value Date   WBC 3.9 (L) 09/03/2019   NEUTROABS 2.0 09/03/2019   HGB 11.5 (L) 09/03/2019   HCT 36.2 09/03/2019   MCV 101.4 (H) 09/03/2019   PLT 178 09/03/2019      Chemistry      Component Value Date/Time   NA 141 09/03/2019 1016   K 3.3 (L) 09/03/2019 1016   CL 99 09/03/2019 1016   CO2 32 09/03/2019 1016   BUN 15 09/03/2019 1016   CREATININE 0.84 09/03/2019 1016      Component Value Date/Time   CALCIUM 8.8 (L) 09/03/2019 1016   ALKPHOS 96 09/03/2019 1016   AST 17 09/03/2019 1016   ALT 17 09/03/2019 1016   BILITOT 0.3 09/03/2019 1016       RADIOGRAPHIC STUDIES: I have reviewed multiple imaging studies with the patient I have personally reviewed the radiological images as listed and agreed with the findings in the report. Ct Abdomen Pelvis W Contrast  Result Date: 09/02/2019 CLINICAL DATA:  Follow-up endometrial carcinoma. Previous hysterectomy and chemotherapy. EXAM: CT ABDOMEN AND PELVIS WITH CONTRAST TECHNIQUE: Multidetector CT imaging of the abdomen and pelvis was performed using the standard protocol following bolus administration of intravenous contrast. CONTRAST:  112m OMNIPAQUE IOHEXOL 300 MG/ML  SOLN COMPARISON:  05/20/2019 FINDINGS: Lower Chest: No acute findings. Hepatobiliary: No hepatic masses identified.  Stable small left hepatic lobe cysts. Prior cholecystectomy. Stable mild diffuse biliary ductal dilatation. Pancreas:  No mass or inflammatory changes. Spleen: Within normal limits in size and appearance. Adrenals/Urinary Tract: No masses identified. No evidence of hydronephrosis. Stomach/Bowel: No evidence of obstruction, inflammatory process or abnormal fluid collections. Diverticulosis is seen mainly involving the descending and sigmoid colon, however there is no evidence of diverticulitis. Increased large amount of stool seen throughout the colon, without evidence of bowel obstruction. Vascular/Lymphatic: No pathologically enlarged lymph nodes identified. Several sub-cm lymph nodes in the left external iliac chain remains stable. No abdominal aortic aneurysm. Reproductive: Prior hysterectomy noted. Adnexal regions are unremarkable in appearance. There has been resolution of small amount of free fluid seen in the pelvic cul-de-sac since prior study. Other:  None. Musculoskeletal:  No suspicious bone lesions identified. IMPRESSION: No evidence of recurrent or metastatic carcinoma within the abdomen or pelvis. Colonic diverticulosis. No radiographic evidence of diverticulitis. Increased large stool burden noted; recommend clinical correlation for possible constipation. Electronically Signed   By: JMarlaine HindM.D.   On: 09/02/2019 09:26    All questions were answered. The patient knows to call the clinic with any problems, questions or concerns. No barriers to learning was detected.  I spent 15 minutes counseling the patient face to face. The total time spent in the appointment was 20 minutes and more than 50% was on counseling and review  of test results  Heath Lark, MD 09/03/2019 11:34 AM

## 2019-09-03 NOTE — Assessment & Plan Note (Signed)
Her neuropathy is stable I refill her prescription gabapentin 

## 2019-09-03 NOTE — Assessment & Plan Note (Signed)
She has mild pancytopenia but much improved since discontinuation of chemotherapy She is not symptomatic Observe only

## 2019-09-03 NOTE — Assessment & Plan Note (Signed)
I have reviewed multiple imaging studies with the patient She has no signs of cancer recurrence Echocardiogram is normal We will continue Herceptin indefinitely.

## 2019-09-04 ENCOUNTER — Telehealth: Payer: Self-pay

## 2019-09-04 LAB — CA 125: Cancer Antigen (CA) 125: 13.5 U/mL (ref 0.0–38.1)

## 2019-09-04 NOTE — Telephone Encounter (Signed)
Called and left below message. Ask her to call for questions. 

## 2019-09-04 NOTE — Telephone Encounter (Signed)
-----   Message from Heath Lark, MD sent at 09/04/2019  7:54 AM EST ----- Regarding: CA-125 is better, pls let her know

## 2019-09-24 ENCOUNTER — Inpatient Hospital Stay (HOSPITAL_BASED_OUTPATIENT_CLINIC_OR_DEPARTMENT_OTHER): Payer: Medicare Other | Admitting: Hematology and Oncology

## 2019-09-24 ENCOUNTER — Other Ambulatory Visit: Payer: Self-pay | Admitting: Hematology and Oncology

## 2019-09-24 ENCOUNTER — Encounter: Payer: Self-pay | Admitting: Hematology and Oncology

## 2019-09-24 ENCOUNTER — Inpatient Hospital Stay: Payer: Medicare Other | Attending: Hematology and Oncology

## 2019-09-24 ENCOUNTER — Inpatient Hospital Stay: Payer: Medicare Other

## 2019-09-24 ENCOUNTER — Other Ambulatory Visit: Payer: Self-pay

## 2019-09-24 DIAGNOSIS — C541 Malignant neoplasm of endometrium: Secondary | ICD-10-CM | POA: Insufficient documentation

## 2019-09-24 DIAGNOSIS — I10 Essential (primary) hypertension: Secondary | ICD-10-CM | POA: Diagnosis not present

## 2019-09-24 DIAGNOSIS — R5381 Other malaise: Secondary | ICD-10-CM | POA: Diagnosis not present

## 2019-09-24 DIAGNOSIS — G8929 Other chronic pain: Secondary | ICD-10-CM | POA: Diagnosis not present

## 2019-09-24 DIAGNOSIS — Z9221 Personal history of antineoplastic chemotherapy: Secondary | ICD-10-CM | POA: Diagnosis not present

## 2019-09-24 DIAGNOSIS — M549 Dorsalgia, unspecified: Secondary | ICD-10-CM | POA: Diagnosis not present

## 2019-09-24 DIAGNOSIS — Z5112 Encounter for antineoplastic immunotherapy: Secondary | ICD-10-CM | POA: Insufficient documentation

## 2019-09-24 DIAGNOSIS — Z79899 Other long term (current) drug therapy: Secondary | ICD-10-CM | POA: Insufficient documentation

## 2019-09-24 DIAGNOSIS — E876 Hypokalemia: Secondary | ICD-10-CM | POA: Diagnosis not present

## 2019-09-24 LAB — CMP (CANCER CENTER ONLY)
ALT: 14 U/L (ref 0–44)
AST: 15 U/L (ref 15–41)
Albumin: 3.8 g/dL (ref 3.5–5.0)
Alkaline Phosphatase: 97 U/L (ref 38–126)
Anion gap: 8 (ref 5–15)
BUN: 16 mg/dL (ref 8–23)
CO2: 32 mmol/L (ref 22–32)
Calcium: 8.9 mg/dL (ref 8.9–10.3)
Chloride: 101 mmol/L (ref 98–111)
Creatinine: 0.83 mg/dL (ref 0.44–1.00)
GFR, Est AFR Am: >60 mL/min (ref >60)
GFR, Estimated: >60 mL/min (ref >60)
Glucose, Bld: 128 mg/dL — ABNORMAL HIGH (ref 70–99)
Potassium: 3.3 mmol/L — ABNORMAL LOW (ref 3.5–5.1)
Sodium: 141 mmol/L (ref 135–145)
Total Bilirubin: 0.3 mg/dL (ref 0.3–1.2)
Total Protein: 7 g/dL (ref 6.5–8.1)

## 2019-09-24 LAB — CBC WITH DIFFERENTIAL (CANCER CENTER ONLY)
Abs Immature Granulocytes: 0 10*3/uL (ref 0.00–0.07)
Basophils Absolute: 0 10*3/uL (ref 0.0–0.1)
Basophils Relative: 1 %
Eosinophils Absolute: 0.1 10*3/uL (ref 0.0–0.5)
Eosinophils Relative: 1 %
HCT: 38.1 % (ref 36.0–46.0)
Hemoglobin: 12.2 g/dL (ref 12.0–15.0)
Immature Granulocytes: 0 %
Lymphocytes Relative: 34 %
Lymphs Abs: 1.7 10*3/uL (ref 0.7–4.0)
MCH: 31.8 pg (ref 26.0–34.0)
MCHC: 32 g/dL (ref 30.0–36.0)
MCV: 99.2 fL (ref 80.0–100.0)
Monocytes Absolute: 0.5 10*3/uL (ref 0.1–1.0)
Monocytes Relative: 10 %
Neutro Abs: 2.7 10*3/uL (ref 1.7–7.7)
Neutrophils Relative %: 54 %
Platelet Count: 179 10*3/uL (ref 150–400)
RBC: 3.84 MIL/uL — ABNORMAL LOW (ref 3.87–5.11)
RDW: 13.9 % (ref 11.5–15.5)
WBC Count: 5 10*3/uL (ref 4.0–10.5)
nRBC: 0 % (ref 0.0–0.2)

## 2019-09-24 MED ORDER — SODIUM CHLORIDE 0.9 % IV SOLN
Freq: Once | INTRAVENOUS | Status: AC
  Administered 2019-09-24: 11:00:00 via INTRAVENOUS
  Filled 2019-09-24: qty 250

## 2019-09-24 MED ORDER — DIPHENHYDRAMINE HCL 25 MG PO CAPS: 25.0000 mg | ORAL_CAPSULE | Freq: Once | ORAL | Status: DC

## 2019-09-24 MED ORDER — SODIUM CHLORIDE 0.9% FLUSH
10.0000 mL | INTRAVENOUS | Status: DC | PRN
  Administered 2019-09-24: 10 mL
  Filled 2019-09-24: qty 10

## 2019-09-24 MED ORDER — DIPHENHYDRAMINE HCL 25 MG PO CAPS
ORAL_CAPSULE | ORAL | Status: AC
  Filled 2019-09-24: qty 1

## 2019-09-24 MED ORDER — HEPARIN SOD (PORK) LOCK FLUSH 100 UNIT/ML IV SOLN
500.0000 [IU] | Freq: Once | INTRAVENOUS | Status: AC | PRN
  Administered 2019-09-24: 12:00:00 500 [IU]
  Filled 2019-09-24: qty 5

## 2019-09-24 MED ORDER — ACETAMINOPHEN 325 MG PO TABS
650.0000 mg | ORAL_TABLET | Freq: Once | ORAL | Status: AC
  Administered 2019-09-24: 650 mg via ORAL

## 2019-09-24 MED ORDER — TRASTUZUMAB-ANNS CHEMO 150 MG IV SOLR
6.0000 mg/kg | Freq: Once | INTRAVENOUS | Status: AC
  Administered 2019-09-24: 504 mg via INTRAVENOUS
  Filled 2019-09-24: qty 24

## 2019-09-24 MED ORDER — ACETAMINOPHEN 325 MG PO TABS
ORAL_TABLET | ORAL | Status: AC
  Filled 2019-09-24: qty 2

## 2019-09-24 MED ORDER — SODIUM CHLORIDE 0.9% FLUSH
10.0000 mL | Freq: Once | INTRAVENOUS | Status: AC
  Administered 2019-09-24: 10 mL
  Filled 2019-09-24: qty 10

## 2019-09-24 NOTE — Assessment & Plan Note (Signed)
Her blood pressure is stable She will continue low-dose diuretic therapy

## 2019-09-24 NOTE — Assessment & Plan Note (Signed)
Her last Ct imaging showed no signs of cancer recurrence Echocardiogram is normal We will continue Herceptin indefinitely.

## 2019-09-24 NOTE — Assessment & Plan Note (Signed)
She has mild hypokalemia likely due to her diuretic treatment but she is not symptomatic Observe only She will continue on potassium rich diet

## 2019-09-24 NOTE — Patient Instructions (Signed)
[facility] Discharge Instructions for Patients Receiving Chemotherapy  Today you received the following chemotherapy agents Trastuzumab-anns  To help prevent nausea and vomiting after your treatment, we encourage you to take your nausea medication as directed.   If you develop nausea and vomiting that is not controlled by your nausea medication, call the clinic.   BELOW ARE SYMPTOMS THAT SHOULD BE REPORTED IMMEDIATELY:  *FEVER GREATER THAN 100.5 F  *CHILLS WITH OR WITHOUT FEVER  NAUSEA AND VOMITING THAT IS NOT CONTROLLED WITH YOUR NAUSEA MEDICATION  *UNUSUAL SHORTNESS OF BREATH  *UNUSUAL BRUISING OR BLEEDING  TENDERNESS IN MOUTH AND THROAT WITH OR WITHOUT PRESENCE OF ULCERS  *URINARY PROBLEMS  *BOWEL PROBLEMS  UNUSUAL RASH Items with * indicate a potential emergency and should be followed up as soon as possible.  Feel free to call the clinic should you have any questions or concerns. The clinic phone number is [phone].  Please show the CHEMO ALERT CARD at check-in to the Emergency Department and triage nurse.   

## 2019-09-24 NOTE — Progress Notes (Signed)
[place] OFFICE PROGRESS NOTE  Patient Care Team: Ronita Hipps, MD as PCP - General (Family Medicine) Nicholaus Bloom, MD (Anesthesiology)  ASSESSMENT & PLAN:  Endometrial cancer [facility]) Her last Ct imaging showed no signs of cancer recurrence Echocardiogram is normal We will continue Herceptin indefinitely.  Essential hypertension Her blood pressure is stable She will continue low-dose diuretic therapy  Hypokalemia due to loss of potassium She has mild hypokalemia likely due to her diuretic treatment but she is not symptomatic Observe only She will continue on potassium rich diet   No orders of the defined types were placed in this encounter.   INTERVAL HISTORY: Please see below for problem oriented charting. 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 A Pap was done 09/02/18 showign ASC-H. She was referred onto GYN and seen by Dr. Paula Compton    09/09/2018 Procedure   She underwent endometrial biopsy   09/12/2018 Pathology Results   Endometrial biopsy positive for adenocarcinoma   09/22/2018 Imaging   Ct abdomen and pelvis showed: Diffuse endometrial thickening, consistent with primary endometrial carcinoma.  Pelvic and abdominal retroperitoneal and retrocrural lymphadenopathy, consistent with metastatic disease.  Moderate hepatic steatosis.   09/25/2018 Cancer Staging   Staging form: Corpus Uteri - Carcinoma and Carcinosarcoma, AJCC 8th Edition - Pathologic: Stage IIIC2 (pT2,  pN2, cM0) - Signed by Heath Lark, MD on 02/23/2019   10/01/2018 Procedure   Successful placement of a right IJ approach Power Port with ultrasound and fluoroscopic guidance. The catheter is ready for use.   10/02/2018 Tumor Marker   Patient's tumor was tested for the following markers: CA-125 Results of the tumor marker test revealed 578   10/03/2018 - 01/16/2019 Chemotherapy   The patient had carboplatin and Taxol with dose adjustment due to neuropathy x 6 cycles   10/24/2018 Tumor Marker   Patient's tumor was tested for the following markers: CA-125 Results of the tumor marker test revealed 296    Genetic Testing   Patient has genetic testing done for MSI/MMR. Results revealed MSI stable and MMR normal on Accession [id number].   11/14/2018 Tumor Marker   Patient's tumor was tested for the following markers: CA-125 Results of the tumor marker test revealed 81   11/27/2018 Imaging   1. Significant decrease in abdominal retroperitoneal and bilateral iliac lymphadenopathy since previous study. 2. Significant decrease in abnormal endometrial soft tissue density since prior study. 3. No new or progressive metastatic disease identified.   12/05/2018 Tumor Marker   Patient's tumor was tested for the following markers: CA-125 Results of the tumor marker test revealed 46.3   12/26/2018 Tumor Marker   Patient's tumor was tested for the following markers: CA-125 Results of the tumor marker test revealed 38.2   01/16/2019 Tumor Marker   Patient's tumor was tested for the following markers: CA-125 Results of the tumor marker test revealed 30.8   02/03/2019 Imaging   1. Numerous retroperitoneal, iliac, and pelvic lymph nodes are stable or slightly decreased in size compared to immediate prior  examination dated 11/27/2018 and significantly decreased in size compared to exam dated 09/20/2018.  2.  Unchanged thickening of the endometrium.  3. Other chronic, incidental, and postoperative  findings as detailed above.   02/12/2019 Imaging   1. Uterus +/- tubes/ovaries, neoplastic, cervix, bilateral fallopian tubes and ovaries - MIXED HIGH-GRADE SEROUS AND ENDOMETRIAL ADENOCARCINOMA, 6.5 CM. SEE NOTE - CARCINOMA INVOLVES ENTIRE ENDOMETRIUM, ANTERIOR AND POSTERIOR LOWER UTERINE SEGMENTS AND ANTERIOR AND POSTERIOR CERVIX. - CARCINOMA INVADES FOR A DEPTH OF 1.9 CM WHERE MYOMETRIAL THICKNESS IS 2.0 CM (GREATER THAN 50% MYOMETRIAL INVASION) - CARCINOMA IS 1 MM FROM THE CERVICAL MARGIN - NEGATIVE FOR LYMPHOVASCULAR OR PERINEURAL INVASION - BILATERAL UNREMARKABLE FALLOPIAN TUBES AND OVARIES, NEGATIVE FOR CARCINOMA - SEE ONCOLOGY TABLE 2. Lymph node, biopsy, right pelvic - METASTATIC CARCINOMA TO ONE OF TWO LYMPH NODES (1/2) Microscopic Comment 1. (v4.1.0.0) UTERUS, CARCINOMA OR CARCINOSARCOMA Procedure: Total hysterectomy with bilateral salpingo-oophorectomy Histologic type: Mixed high-grade serous and endometrioid adenocarcinoma Histologic Grade: NA Myometrial invasion: Present Depth of invasion: 19 mm Myometrial thickness: 20 mm Uterine Serosa Involvement: Not identified Cervical stromal involvement: Present Extent of involvement of other organs: NA Lymphovascular invasion: Not identified Regional Lymph Nodes: Examined: 0 Sentinel 2 Non-sentinel 2 Total Lymph nodes with metastasis: 1 Isolated tumor cells (< 0.2 mm): 0 Micrometastasis: (> 0.2 mm and < 2.0 mm): 1 Macrometastasis: (> 2.0 mm): 0 Extracapsular extension: Not identified Tumor block for ancillary studies: 68F MMR / MSI testing: Pending Pathologic Stage Classification (pTNM, AJCC 8th edition): pT2, pN21m FIGO Stage: IIIC1 Representative Tumor Block: 1A, 1C (v4.1.0.0) Diagnosis Note 1. Immunohistochemical stains for p16, p53, ER and Ki-67 show a pattern of staining consistent with mixed high-grade serous and endometrial adenocarcinoma. Dr. KLyndon Codehas reviewed this case and concurs with the above interpretation.  A molecular study for microsatellite instability (MSI) and immunostains for MMR-related proteins are pending and will be reported in an addendum.   02/12/2019 Surgery   Pre-operative Diagnosis: endometrial cancer stage IIIC2, s/p neoadjuvant chemotherapy, extreme obesity (BMI 53kg/m2)  Operation: Robotic-assisted laparoscopic total hysterectomy with bilateral salpingoophorectomy, right pelvic lymphadenectomy. 22 modifier for extreme obesity  Surgeon: RDonaciano Eva Assistant Surgeon: LLahoma CrockerMD  Operative Findings:  : extreme obesity, BMI 53kg/m2, with significant retroperitoneal and intraperitoneal adiposity. Obesity necessitated an additional hour of operating time to create safe exposure. Obesity required additional personnel in the operating room for positioning and retraction. Severe obesity substantially increased the complexity of the procedure. 8cm uterus, grossly normal, normal appearing cervix. Retroperitoneal fibrosis consistent with nodal positivity. Clinically suspicious right pelvic lymph node. Unable to completely visualize retroperitoneal nodes due to extreme obesity.    02/12/2019 Pathology Results   IMMUNOHISTOCHEMICAL AND MORPHOMETRIC ANALYSIS PERFORMED MANUALLY The tumor cells are POSITIVE for Her2 (3+). Estrogen Receptor: 70%, POSITIVE, STRONG STAINING INTENSITY Progesterone Receptor: 70%, POSITIVE, STRONG STAINING INTENSITY Proliferation Marker Ki67: 20% REFERENCE RANGE ESTROGEN RECEPTOR NEGATIVE 0% POSITIVE =>1% REFERENCE RANGE PROGESTERONE RECEPTOR NEGATIVE 0% POSITIVE =>1% All controls stained appropriately  1. Uterus +/- tubes/ovaries, neoplastic, cervix, bilateral fallopian tubes and ovaries - MIXED HIGH-GRADE SEROUS AND ENDOMETRIAL ADENOCARCINOMA, 6.5 CM. SEE NOTE - CARCINOMA INVOLVES ENTIRE ENDOMETRIUM, ANTERIOR AND POSTERIOR LOWER UTERINE SEGMENTS AND ANTERIOR AND POSTERIOR CERVIX. - CARCINOMA INVADES FOR A DEPTH OF 1.9 CM WHERE  MYOMETRIAL THICKNESS IS 2.0 CM (GREATER THAN 50% MYOMETRIAL INVASION) - CARCINOMA IS 1 MM FROM THE CERVICAL MARGIN - NEGATIVE FOR LYMPHOVASCULAR OR PERINEURAL INVASION - BILATERAL UNREMARKABLE FALLOPIAN TUBES AND OVARIES, NEGATIVE FOR CARCINOMA - SEE  ONCOLOGY TABLE 2. Lymph node, biopsy, right pelvic - METASTATIC CARCINOMA TO ONE OF TWO LYMPH NODES (1/2) Microscopic Comment 1. (v4.1.0.0) UTERUS, CARCINOMA OR CARCINOSARCOMA Procedure: Total hysterectomy with bilateral salpingo-oophorectomy Histologic type: Mixed high-grade serous and endometrioid adenocarcinoma Histologic Grade: NA Myometrial invasion: Present Depth of invasion: 19 mm Myometrial thickness: 20 mm Uterine Serosa Involvement: Not identified Cervical stromal involvement: Present Extent of involvement of other organs: NA Lymphovascular invasion: Not identified Regional Lymph Nodes: Examined: 0 Sentinel 2 Non-sentinel 2 Total Lymph nodes with metastasis: 1 Isolated tumor cells (< 0.2 mm): 0 Micrometastasis: (> 0.2 mm and < 2.0 mm): 1 Macrometastasis: (> 2.0 mm): 0 Extracapsular extension: Not identified Tumor block for ancillary studies: 16F MMR / MSI testing: Pending Pathologic Stage Classification (pTNM, AJCC 8th edition): pT2, pN66m FIGO Stage: IIIC1 Representative Tumor Block: 1A, 1C   03/17/2019 Echocardiogram   1. The left ventricle has normal systolic function, with an ejection fraction of 55-60%. The cavity size was normal. There is moderate concentric left ventricular hypertrophy. Left ventricular diastolic Doppler parameters are consistent with impaired relaxation.  2. The right ventricle has normal systolic function. The cavity was normal. There is no increase in right ventricular wall thickness.  3. GLS: -15.5%, no prior study available for comparison.   03/17/2019 Tumor Marker   Patient's tumor was tested for the following markers: CA-125 Results of the tumor marker test revealed 43   03/20/2019 -   Chemotherapy   The patient had trastuzumab (HERCEPTIN) 693 mg in sodium chloride 0.9 % 250 mL chemo infusion, 8 mg/kg = 693 mg, Intravenous,  Once, 5 of 5 cycles Dose modification: 6 mg/kg (original dose 6 mg/kg, Cycle 2, Reason: Provider Judgment) Administration: 693 mg (03/20/2019), 504 mg (04/10/2019), 504 mg (05/01/2019), 504 mg (05/21/2019), 504 mg (06/10/2019) trastuzumab-anns (KANJINTI) 504 mg in sodium chloride 0.9 % 250 mL chemo infusion, 6 mg/kg = 504 mg (100 % of original dose 6 mg/kg), Intravenous,  Once, 4 of 6 cycles Dose modification: 6 mg/kg (original dose 6 mg/kg, Cycle 6, Reason: Other (see comments), Comment: biosimilar conversion) Administration: 504 mg (07/02/2019), 504 mg (07/23/2019), 504 mg (08/13/2019), 504 mg (09/03/2019)  for chemotherapy treatment.    04/30/2019 Tumor Marker   Patient's tumor was tested for the following markers:CA-125 Results of the tumor marker test revealed 29   05/20/2019 Imaging   1. No evidence of metastatic disease. 2. Small pelvic and left paracolic gutter ascites, new. 3. Aortic atherosclerosis (ICD10-170.0). Coronary artery calcification.     05/26/2019 Echocardiogram      1. The left ventricle has normal systolic function with an ejection fraction of 60-65%. The cavity size was normal. There is mildly increased left ventricular wall thickness. Left ventricular diastolic parameters were normal.  2. GLS -16.4% (underestimated due to poor endocardial tracking).  3. The right ventricle has normal systolic function. The cavity was normal. There is no increase in right ventricular wall thickness.  4. Mild calcification of the mitral valve leaflet.  5. Mild calcification of the aortic valve. No stenosis of the aortic valve.  6. The aorta is normal in size and structure.   09/02/2019 Imaging   No evidence of recurrent or metastatic carcinoma within the abdomen or pelvis.   Colonic diverticulosis. No radiographic evidence of diverticulitis.    Increased large stool burden noted; recommend clinical correlation for possible constipation.   09/02/2019 Echocardiogram   1. Left ventricular ejection fraction, by visual estimation, is 60 to 65%. The left ventricle has normal function. There  is no left ventricular hypertrophy.  2. Global right ventricle has normal systolic function.The right ventricular size is normal. No increase in right ventricular wall thickness.  3. Left atrial size was normal.  4. Right atrial size was normal.  5. The mitral valve is normal in structure. No evidence of mitral valve regurgitation.  6. The tricuspid valve is normal in structure. Tricuspid valve regurgitation is trivial.  7. The aortic valve is normal in structure. Aortic valve regurgitation is not visualized.  8. The pulmonic valve was normal in structure. Pulmonic valve regurgitation is trivial.  9. Mildly elevated pulmonary artery systolic pressure. 10. The average left ventricular global longitudinal strain is -26.9 %.   09/03/2019 Tumor Marker   Patient's tumor was tested for the following markers: CA-125 Results of the tumor marker test revealed 13.5     REVIEW OF SYSTEMS:   Constitutional: Denies fevers, chills or abnormal weight loss Eyes: Denies blurriness of vision Ears, nose, mouth, throat, and face: Denies mucositis or sore throat Respiratory: Denies cough, dyspnea or wheezes Cardiovascular: Denies palpitation, chest discomfort or lower extremity swelling Gastrointestinal:  Denies nausea, heartburn or change in bowel habits Skin: Denies abnormal skin rashes Lymphatics: Denies new lymphadenopathy or easy bruising Neurological:Denies numbness, tingling or new weaknesses Behavioral/Psych: Mood is stable, no new changes  All other systems were reviewed with the patient and are negative.  I have reviewed the past medical history, past surgical history, social history and family history with the patient and they are unchanged from  previous note.  ALLERGIES:  is allergic to tegaderm ag mesh [silver].  MEDICATIONS:  Current Outpatient Medications  Medication Sig Dispense Refill  . diphenhydrAMINE (BENADRYL) 25 mg capsule Take 100 mg by mouth 2 (two) times a day.    . furosemide (LASIX) 20 MG tablet Take 1 tablet (20 mg total) by mouth daily. 30 tablet 1  . gabapentin (NEURONTIN) 400 MG capsule Take 3 capsules (1,200 mg total) by mouth 3 (three) times daily. 270 capsule 11  . lidocaine-prilocaine (EMLA) cream Apply to affected area once 30 g 3  . losartan-hydrochlorothiazide (HYZAAR) 50-12.5 MG tablet Take 2 tablets by mouth daily.    . ondansetron (ZOFRAN) 8 MG tablet Take 8 mg by mouth every 8 (eight) hours as needed.    Marland Kitchen oxyCODONE-acetaminophen (PERCOCET) 10-325 MG tablet Take 1-2 tablets by mouth every 6 (six) hours as needed for pain.    Marland Kitchen prochlorperazine (COMPAZINE) 10 MG tablet TAKE 1 TABLET BY MOUTH EVERY 6 HOURS AS NEEDED FOR NAUSEA OR VOMITING 30 tablet 1   No current facility-administered medications for this visit.    PHYSICAL EXAMINATION: ECOG PERFORMANCE STATUS: 1 - Symptomatic but completely ambulatory  Vitals:   09/24/19 1008  BP: (!) 144/90  Pulse: 68  Resp: 18  Temp: 98 F (36.7 C)  SpO2: 99%   Filed Weights   09/24/19 1008  Weight: 289 lb 12.8 oz (131.5 kg)    GENERAL:alert, no distress and comfortable SKIN: skin color, texture, turgor are normal, no rashes or significant lesions EYES: normal, Conjunctiva are pink and non-injected, sclera clear OROPHARYNX:no exudate, no erythema and lips, buccal mucosa, and tongue normal  NECK: supple, thyroid normal size, non-tender, without nodularity LYMPH:  no palpable lymphadenopathy in the cervical, axillary or inguinal LUNGS: clear to auscultation and percussion with normal breathing effort HEART: regular rate & rhythm and no murmurs and no lower extremity edema ABDOMEN:abdomen soft, non-tender and normal bowel sounds Musculoskeletal:no  cyanosis of digits and no clubbing  NEURO: alert & oriented x 3 with fluent speech, no focal motor/sensory deficits  LABORATORY DATA:  I have reviewed the data as listed    Component Value Date/Time   NA 141 09/24/2019 0955   K 3.3 (L) 09/24/2019 0955   CL 101 09/24/2019 0955   CO2 32 09/24/2019 0955   GLUCOSE 128 (H) 09/24/2019 0955   BUN 16 09/24/2019 0955   CREATININE 0.83 09/24/2019 0955   CALCIUM 8.9 09/24/2019 0955   PROT 7.0 09/24/2019 0955   ALBUMIN 3.8 09/24/2019 0955   AST 15 09/24/2019 0955   ALT 14 09/24/2019 0955   ALKPHOS 97 09/24/2019 0955   BILITOT 0.3 09/24/2019 0955   GFRNONAA >60 09/24/2019 0955   GFRAA >60 09/24/2019 0955    No results found for: SPEP, UPEP  Lab Results  Component Value Date   WBC 5.0 09/24/2019   NEUTROABS 2.7 09/24/2019   HGB 12.2 09/24/2019   HCT 38.1 09/24/2019   MCV 99.2 09/24/2019   PLT 179 09/24/2019      Chemistry      Component Value Date/Time   NA 141 09/24/2019 0955   K 3.3 (L) 09/24/2019 0955   CL 101 09/24/2019 0955   CO2 32 09/24/2019 0955   BUN 16 09/24/2019 0955   CREATININE 0.83 09/24/2019 0955      Component Value Date/Time   CALCIUM 8.9 09/24/2019 0955   ALKPHOS 97 09/24/2019 0955   AST 15 09/24/2019 0955   ALT 14 09/24/2019 0955   BILITOT 0.3 09/24/2019 0955       RADIOGRAPHIC STUDIES: I have personally reviewed the radiological images as listed and agreed with the findings in the report. CT ABDOMEN PELVIS W CONTRAST  Result Date: 09/02/2019 CLINICAL DATA:  Follow-up endometrial carcinoma. Previous hysterectomy and chemotherapy. EXAM: CT ABDOMEN AND PELVIS WITH CONTRAST TECHNIQUE: Multidetector CT imaging of the abdomen and pelvis was performed using the standard protocol following bolus administration of intravenous contrast. CONTRAST:  188m OMNIPAQUE IOHEXOL 300 MG/ML  SOLN COMPARISON:  05/20/2019 FINDINGS: Lower Chest: No acute findings. Hepatobiliary: No hepatic masses identified. Stable small  left hepatic lobe cysts. Prior cholecystectomy. Stable mild diffuse biliary ductal dilatation. Pancreas:  No mass or inflammatory changes. Spleen: Within normal limits in size and appearance. Adrenals/Urinary Tract: No masses identified. No evidence of hydronephrosis. Stomach/Bowel: No evidence of obstruction, inflammatory process or abnormal fluid collections. Diverticulosis is seen mainly involving the descending and sigmoid colon, however there is no evidence of diverticulitis. Increased large amount of stool seen throughout the colon, without evidence of bowel obstruction. Vascular/Lymphatic: No pathologically enlarged lymph nodes identified. Several sub-cm lymph nodes in the left external iliac chain remains stable. No abdominal aortic aneurysm. Reproductive: Prior hysterectomy noted. Adnexal regions are unremarkable in appearance. There has been resolution of small amount of free fluid seen in the pelvic cul-de-sac since prior study. Other:  None. Musculoskeletal:  No suspicious bone lesions identified. IMPRESSION: No evidence of recurrent or metastatic carcinoma within the abdomen or pelvis. Colonic diverticulosis. No radiographic evidence of diverticulitis. Increased large stool burden noted; recommend clinical correlation for possible constipation. Electronically Signed   By: JMarlaine HindM.D.   On: 09/02/2019 09:26   ECHOCARDIOGRAM COMPLETE  Result Date: 09/02/2019   ECHOCARDIOGRAM REPORT   Patient Name:   Kathryn VERACRUZDate of Exam: 09/02/2019 Medical Rec #:  [id number]       Height:       63.0 in Accession #:    [id number]  Weight:       296.8 lb Date of Birth:  Apr 07, 1951        BSA:          2.29 m Patient Age:    68 years         BP:           142/70 mmHg Patient Gender: F                HR:           58 bpm. Exam Location:  Outpatient Procedure: 2D Echo and Strain Analysis Indications:    Chemotherapy evaluation v87.41  History:        Patient has prior history of Echocardiogram  examinations, most                 recent 05/26/2019.  Sonographer:    Mikki Santee RDCS (AE) Referring Phys: [id number] Verdella Laidlaw [place]  1. Left ventricular ejection fraction, by visual estimation, is 60 to 65%. The left ventricle has normal function. There is no left ventricular hypertrophy.  2. Global right ventricle has normal systolic function.The right ventricular size is normal. No increase in right ventricular wall thickness.  3. Left atrial size was normal.  4. Right atrial size was normal.  5. The mitral valve is normal in structure. No evidence of mitral valve regurgitation.  6. The tricuspid valve is normal in structure. Tricuspid valve regurgitation is trivial.  7. The aortic valve is normal in structure. Aortic valve regurgitation is not visualized.  8. The pulmonic valve was normal in structure. Pulmonic valve regurgitation is trivial.  9. Mildly elevated pulmonary artery systolic pressure. 10. The average left ventricular global longitudinal strain is -26.9 %. FINDINGS  Left Ventricle: Left ventricular ejection fraction, by visual estimation, is 60 to 65%. The left ventricle has normal function. The average left ventricular global longitudinal strain is -26.9 %. There is no left ventricular hypertrophy. Left ventricular diastolic parameters were normal. Right Ventricle: The right ventricular size is normal. No increase in right ventricular wall thickness. Global RV systolic function is has normal systolic function. The tricuspid regurgitant velocity is 2.80 m/s, and with an assumed right atrial pressure  of 8 mmHg, the estimated right ventricular systolic pressure is mildly elevated at 39.5 mmHg. Left Atrium: Left atrial size was normal in size. Right Atrium: Right atrial size was normal in size Pericardium: There is no evidence of pericardial effusion. Mitral Valve: The mitral valve is normal in structure. No evidence of mitral valve regurgitation. Tricuspid Valve: The tricuspid valve is  normal in structure. Tricuspid valve regurgitation is trivial. Aortic Valve: The aortic valve is normal in structure. Aortic valve regurgitation is not visualized. Pulmonic Valve: The pulmonic valve was normal in structure. Pulmonic valve regurgitation is trivial. Aorta: The aortic root and ascending aorta are structurally normal, with no evidence of dilitation. IAS/Shunts: No atrial level shunt detected by color flow Doppler.  LEFT VENTRICLE PLAX 2D LVIDd:         4.70 cm  Diastology LVIDs:         3.20 cm  LV e' lateral:   9.68 cm/s LV PW:         1.10 cm  LV E/e' lateral: 8.3 LV IVS:        1.30 cm  LV e' medial:    7.83 cm/s LVOT diam:     2.00 cm  LV E/e' medial:  10.3 LV SV:  61 ml LV SV Index:   24.23    2D Longitudinal Strain LVOT Area:     3.14 cm 2D Strain GLS Avg:     -26.9 %  RIGHT VENTRICLE RV Basal diam:  3.00 cm RV Mid diam:    3.30 cm RV S prime:     14.40 cm/s TAPSE (M-mode): 2.4 cm LEFT ATRIUM             Index LA diam:        3.90 cm 1.71 cm/m LA Vol (A2C):   59.2 ml 25.89 ml/m LA Vol (A4C):   51.9 ml 22.70 ml/m LA Biplane Vol: 59.4 ml 25.97 ml/m  AORTIC VALVE LVOT Vmax:   102.00 cm/s LVOT Vmean:  66.400 cm/s LVOT VTI:    0.268 m  AORTA Ao Root diam: 3.20 cm MITRAL VALVE                        TRICUSPID VALVE MV Area (PHT): 3.21 cm             TR Peak grad:   31.5 mmHg MV PHT:        68.44 msec           TR Vmax:        285.00 cm/s MV Decel Time: 236 msec MV E velocity: 80.50 cm/s 103 cm/s  SHUNTS MV A velocity: 75.10 cm/s 70.3 cm/s Systemic VTI:  0.27 m MV E/A ratio:  1.07       1.5       Systemic Diam: 2.00 cm  Glori Bickers MD Electronically signed by Glori Bickers MD Signature Date/Time: 09/02/2019/12:28:36 PM    Final     All questions were answered. The patient knows to call the clinic with any problems, questions or concerns. No barriers to learning was detected.  I spent 15 minutes counseling the patient face to face. The total time spent in the appointment was 20  minutes and more than 50% was on counseling and review of test results  Heath Lark, MD 09/24/2019 10:51 AM

## 2019-09-25 LAB — CA 125: Cancer Antigen (CA) 125: 15.5 U/mL (ref 0.0–38.1)

## 2019-10-15 ENCOUNTER — Inpatient Hospital Stay: Payer: Medicare Other

## 2019-10-15 ENCOUNTER — Other Ambulatory Visit: Payer: Self-pay

## 2019-10-15 ENCOUNTER — Encounter: Payer: Self-pay | Admitting: Hematology and Oncology

## 2019-10-15 ENCOUNTER — Inpatient Hospital Stay (HOSPITAL_BASED_OUTPATIENT_CLINIC_OR_DEPARTMENT_OTHER): Payer: Medicare Other | Admitting: Hematology and Oncology

## 2019-10-15 ENCOUNTER — Telehealth: Payer: Self-pay | Admitting: Hematology and Oncology

## 2019-10-15 DIAGNOSIS — Z9221 Personal history of antineoplastic chemotherapy: Secondary | ICD-10-CM | POA: Diagnosis not present

## 2019-10-15 DIAGNOSIS — M549 Dorsalgia, unspecified: Secondary | ICD-10-CM | POA: Diagnosis not present

## 2019-10-15 DIAGNOSIS — C541 Malignant neoplasm of endometrium: Secondary | ICD-10-CM

## 2019-10-15 DIAGNOSIS — Z79899 Other long term (current) drug therapy: Secondary | ICD-10-CM | POA: Diagnosis not present

## 2019-10-15 DIAGNOSIS — G8929 Other chronic pain: Secondary | ICD-10-CM

## 2019-10-15 DIAGNOSIS — I10 Essential (primary) hypertension: Secondary | ICD-10-CM | POA: Diagnosis not present

## 2019-10-15 DIAGNOSIS — R5381 Other malaise: Secondary | ICD-10-CM

## 2019-10-15 DIAGNOSIS — E876 Hypokalemia: Secondary | ICD-10-CM | POA: Diagnosis not present

## 2019-10-15 DIAGNOSIS — Z5112 Encounter for antineoplastic immunotherapy: Secondary | ICD-10-CM | POA: Diagnosis not present

## 2019-10-15 LAB — CMP (CANCER CENTER ONLY)
ALT: 14 U/L (ref 0–44)
AST: 14 U/L — ABNORMAL LOW (ref 15–41)
Albumin: 3.8 g/dL (ref 3.5–5.0)
Alkaline Phosphatase: 95 U/L (ref 38–126)
Anion gap: 10 (ref 5–15)
BUN: 18 mg/dL (ref 8–23)
CO2: 29 mmol/L (ref 22–32)
Calcium: 8.8 mg/dL — ABNORMAL LOW (ref 8.9–10.3)
Chloride: 101 mmol/L (ref 98–111)
Creatinine: 0.85 mg/dL (ref 0.44–1.00)
GFR, Est AFR Am: >60 mL/min (ref >60)
GFR, Estimated: >60 mL/min (ref >60)
Glucose, Bld: 120 mg/dL — ABNORMAL HIGH (ref 70–99)
Potassium: 3.4 mmol/L — ABNORMAL LOW (ref 3.5–5.1)
Sodium: 140 mmol/L (ref 135–145)
Total Bilirubin: 0.3 mg/dL (ref 0.3–1.2)
Total Protein: 7 g/dL (ref 6.5–8.1)

## 2019-10-15 LAB — CBC WITH DIFFERENTIAL (CANCER CENTER ONLY)
Abs Immature Granulocytes: 0.01 10*3/uL (ref 0.00–0.07)
Basophils Absolute: 0 10*3/uL (ref 0.0–0.1)
Basophils Relative: 0 %
Eosinophils Absolute: 0.1 10*3/uL (ref 0.0–0.5)
Eosinophils Relative: 1 %
HCT: 37.3 % (ref 36.0–46.0)
Hemoglobin: 11.9 g/dL — ABNORMAL LOW (ref 12.0–15.0)
Immature Granulocytes: 0 %
Lymphocytes Relative: 35 %
Lymphs Abs: 1.6 10*3/uL (ref 0.7–4.0)
MCH: 30.8 pg (ref 26.0–34.0)
MCHC: 31.9 g/dL (ref 30.0–36.0)
MCV: 96.6 fL (ref 80.0–100.0)
Monocytes Absolute: 0.4 10*3/uL (ref 0.1–1.0)
Monocytes Relative: 10 %
Neutro Abs: 2.4 10*3/uL (ref 1.7–7.7)
Neutrophils Relative %: 54 %
Platelet Count: 183 10*3/uL (ref 150–400)
RBC: 3.86 MIL/uL — ABNORMAL LOW (ref 3.87–5.11)
RDW: 14.2 % (ref 11.5–15.5)
WBC Count: 4.5 10*3/uL (ref 4.0–10.5)
nRBC: 0 % (ref 0.0–0.2)

## 2019-10-15 MED ORDER — SODIUM CHLORIDE 0.9 % IV SOLN
Freq: Once | INTRAVENOUS | Status: AC
  Filled 2019-10-15: qty 250

## 2019-10-15 MED ORDER — SODIUM CHLORIDE 0.9% FLUSH
10.0000 mL | Freq: Once | INTRAVENOUS | Status: AC
  Administered 2019-10-15: 10 mL
  Filled 2019-10-15: qty 10

## 2019-10-15 MED ORDER — DIPHENHYDRAMINE HCL 25 MG PO CAPS
ORAL_CAPSULE | ORAL | Status: AC
  Filled 2019-10-15: qty 1

## 2019-10-15 MED ORDER — DIPHENHYDRAMINE HCL 25 MG PO CAPS: 25.0000 mg | ORAL_CAPSULE | Freq: Once | ORAL | Status: DC

## 2019-10-15 MED ORDER — TRASTUZUMAB-ANNS CHEMO 150 MG IV SOLR
6.0000 mg/kg | Freq: Once | INTRAVENOUS | Status: AC
  Administered 2019-10-15: 13:00:00 504 mg via INTRAVENOUS
  Filled 2019-10-15: qty 24

## 2019-10-15 MED ORDER — ACETAMINOPHEN 325 MG PO TABS: 650.0000 mg | ORAL_TABLET | Freq: Once | ORAL | Status: DC

## 2019-10-15 MED ORDER — HEPARIN SOD (PORK) LOCK FLUSH 100 UNIT/ML IV SOLN
500.0000 [IU] | Freq: Once | INTRAVENOUS | Status: AC | PRN
  Administered 2019-10-15: 13:00:00 500 [IU]
  Filled 2019-10-15: qty 5

## 2019-10-15 MED ORDER — ACETAMINOPHEN 325 MG PO TABS
ORAL_TABLET | ORAL | Status: AC
  Filled 2019-10-15: qty 2

## 2019-10-15 MED ORDER — SODIUM CHLORIDE 0.9% FLUSH
10.0000 mL | INTRAVENOUS | Status: DC | PRN
  Administered 2019-10-15: 13:00:00 10 mL
  Filled 2019-10-15: qty 10

## 2019-10-15 NOTE — Assessment & Plan Note (Signed)
I recommend resumption of physical therapy next month once she is recovered from her back pain.

## 2019-10-15 NOTE — Patient Instructions (Signed)

## 2019-10-15 NOTE — Patient Instructions (Signed)
[facility] °Discharge Instructions for Patients Receiving Chemotherapy ° °Today you received the following chemotherapy agents Trastuzumab ° °To help prevent nausea and vomiting after your treatment, we encourage you to take your nausea medication as directed. °  °If you develop nausea and vomiting that is not controlled by your nausea medication, call the clinic.  ° °BELOW ARE SYMPTOMS THAT SHOULD BE REPORTED IMMEDIATELY: °· *FEVER GREATER THAN 100.5 F °· *CHILLS WITH OR WITHOUT FEVER °· NAUSEA AND VOMITING THAT IS NOT CONTROLLED WITH YOUR NAUSEA MEDICATION °· *UNUSUAL SHORTNESS OF BREATH °· *UNUSUAL BRUISING OR BLEEDING °· TENDERNESS IN MOUTH AND THROAT WITH OR WITHOUT PRESENCE OF ULCERS °· *URINARY PROBLEMS °· *BOWEL PROBLEMS °· UNUSUAL RASH °Items with * indicate a potential emergency and should be followed up as soon as possible. ° °Feel free to call the clinic should you have any questions or concerns. The clinic phone number is [phone]. ° °Please show the CHEMO ALERT CARD at check-in to the Emergency Department and triage nurse. ° ° °

## 2019-10-15 NOTE — Telephone Encounter (Signed)
Scheduled appt per 12/31 sch message - pt to get an updated schedule in chemo

## 2019-10-15 NOTE — Assessment & Plan Note (Signed)
She had recent new onset of back pain could be due to muscular strain from recent strenuous activity She has appointment to follow-up with her pain management specialist next month

## 2019-10-15 NOTE — Progress Notes (Signed)
[facility] OFFICE PROGRESS NOTE  Patient Care Team: Ronita Hipps, MD as PCP - General (Family Medicine) Nicholaus Bloom, MD (Anesthesiology)  ASSESSMENT & PLAN:  Endometrial cancer [facility]) Her last Ct imaging showed no signs of cancer recurrence Echocardiogram is normal We will continue Herceptin indefinitely. Her next echocardiogram will be due around mid February and I plan to delay her next CT imaging until March unless she have new signs or symptoms to suggest cancer progression  Chronic back pain greater than 3 months duration She had recent new onset of back pain could be due to muscular strain from recent strenuous activity She has appointment to follow-up with her pain management specialist next month  Physical debility I recommend resumption of physical therapy next month once she is recovered from her back pain.   No orders of the defined types were placed in this encounter.   INTERVAL HISTORY: Please see below for problem oriented charting. She returns for treatment and follow-up She had a good Christmas with her family She thought she might have strained her back recently due to extraneous physical activity It is related to position and near the right flank area She denies recent infection, fever or chills No recent cough, chest pain or shortness of breath She has not been doing much physical therapy lately since the holidays.  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 A Pap was done 09/02/18 showign ASC-H. She was referred onto GYN and seen by Dr. Paula Compton    09/09/2018 Procedure   She underwent endometrial biopsy   09/12/2018 Pathology Results   Endometrial biopsy positive for adenocarcinoma    09/22/2018 Imaging   Ct abdomen and pelvis showed: Diffuse endometrial thickening, consistent with primary endometrial carcinoma.  Pelvic and abdominal retroperitoneal and retrocrural lymphadenopathy, consistent with metastatic disease.  Moderate hepatic steatosis.   09/25/2018 Cancer Staging   Staging form: Corpus Uteri - Carcinoma and Carcinosarcoma, AJCC 8th Edition - Pathologic: Stage IIIC2 (pT2, pN2, cM0) - Signed by Heath Lark, MD on 02/23/2019   10/01/2018 Procedure   Successful placement of a right IJ approach Power Port with ultrasound and fluoroscopic guidance. The catheter is ready for use.   10/02/2018 Tumor Marker   Patient's tumor was tested for the following markers: CA-125 Results of the tumor marker test revealed 578   10/03/2018 - 01/16/2019 Chemotherapy   The patient had carboplatin and Taxol with dose adjustment due to neuropathy x 6 cycles   10/24/2018 Tumor Marker   Patient's tumor was tested for the following markers: CA-125 Results of the tumor marker test revealed 296    Genetic Testing   Patient has genetic testing done for MSI/MMR. Results revealed MSI stable and MMR normal on Accession [id number].   11/14/2018 Tumor Marker   Patient's tumor was tested for the following markers: CA-125 Results of the tumor marker test revealed 81   11/27/2018 Imaging   1. Significant decrease in abdominal retroperitoneal and bilateral iliac lymphadenopathy since previous study. 2. Significant decrease in abnormal endometrial soft tissue density since prior study. 3. No new or progressive metastatic disease identified.   12/05/2018 Tumor Marker   Patient's tumor was tested for the following markers: CA-125 Results of the tumor marker test revealed 46.3  12/26/2018 Tumor Marker   Patient's tumor was tested for the following markers: CA-125 Results of the tumor marker test revealed 38.2   01/16/2019 Tumor Marker   Patient's tumor was tested for the following markers:  CA-125 Results of the tumor marker test revealed 30.8   02/03/2019 Imaging   1. Numerous retroperitoneal, iliac, and pelvic lymph nodes are stable or slightly decreased in size compared to immediate prior examination dated 11/27/2018 and significantly decreased in size compared to exam dated 09/20/2018.  2.  Unchanged thickening of the endometrium.  3. Other chronic, incidental, and postoperative findings as detailed above.   02/12/2019 Imaging   1. Uterus +/- tubes/ovaries, neoplastic, cervix, bilateral fallopian tubes and ovaries - MIXED HIGH-GRADE SEROUS AND ENDOMETRIAL ADENOCARCINOMA, 6.5 CM. SEE NOTE - CARCINOMA INVOLVES ENTIRE ENDOMETRIUM, ANTERIOR AND POSTERIOR LOWER UTERINE SEGMENTS AND ANTERIOR AND POSTERIOR CERVIX. - CARCINOMA INVADES FOR A DEPTH OF 1.9 CM WHERE MYOMETRIAL THICKNESS IS 2.0 CM (GREATER THAN 50% MYOMETRIAL INVASION) - CARCINOMA IS 1 MM FROM THE CERVICAL MARGIN - NEGATIVE FOR LYMPHOVASCULAR OR PERINEURAL INVASION - BILATERAL UNREMARKABLE FALLOPIAN TUBES AND OVARIES, NEGATIVE FOR CARCINOMA - SEE ONCOLOGY TABLE 2. Lymph node, biopsy, right pelvic - METASTATIC CARCINOMA TO ONE OF TWO LYMPH NODES (1/2) Microscopic Comment 1. (v4.1.0.0) UTERUS, CARCINOMA OR CARCINOSARCOMA Procedure: Total hysterectomy with bilateral salpingo-oophorectomy Histologic type: Mixed high-grade serous and endometrioid adenocarcinoma Histologic Grade: NA Myometrial invasion: Present Depth of invasion: 19 mm Myometrial thickness: 20 mm Uterine Serosa Involvement: Not identified Cervical stromal involvement: Present Extent of involvement of other organs: NA Lymphovascular invasion: Not identified Regional Lymph Nodes: Examined: 0 Sentinel 2 Non-sentinel 2 Total Lymph nodes with metastasis: 1 Isolated tumor cells (< 0.2 mm): 0 Micrometastasis: (> 0.2 mm and < 2.0 mm): 1 Macrometastasis: (> 2.0 mm): 0 Extracapsular extension: Not identified Tumor block for ancillary studies: 55F MMR  / MSI testing: Pending Pathologic Stage Classification (pTNM, AJCC 8th edition): pT2, pN25m FIGO Stage: IIIC1 Representative Tumor Block: 1A, 1C (v4.1.0.0) Diagnosis Note 1. Immunohistochemical stains for p16, p53, ER and Ki-67 show a pattern of staining consistent with mixed high-grade serous and endometrial adenocarcinoma. Dr. KLyndon Codehas reviewed this case and concurs with the above interpretation. A molecular study for microsatellite instability (MSI) and immunostains for MMR-related proteins are pending and will be reported in an addendum.   02/12/2019 Surgery   Pre-operative Diagnosis: endometrial cancer stage IIIC2, s/p neoadjuvant chemotherapy, extreme obesity (BMI 53kg/m2)  Operation: Robotic-assisted laparoscopic total hysterectomy with bilateral salpingoophorectomy, right pelvic lymphadenectomy. 22 modifier for extreme obesity  Surgeon: RDonaciano Eva Assistant Surgeon: LLahoma CrockerMD  Operative Findings:  : extreme obesity, BMI 53kg/m2, with significant retroperitoneal and intraperitoneal adiposity. Obesity necessitated an additional hour of operating time to create safe exposure. Obesity required additional personnel in the operating room for positioning and retraction. Severe obesity substantially increased the complexity of the procedure. 8cm uterus, grossly normal, normal appearing cervix. Retroperitoneal fibrosis consistent with nodal positivity. Clinically suspicious right pelvic lymph node. Unable to completely visualize retroperitoneal nodes due to extreme obesity.    02/12/2019 Pathology Results   IMMUNOHISTOCHEMICAL AND MORPHOMETRIC ANALYSIS PERFORMED MANUALLY The tumor cells are POSITIVE for Her2 (3+). Estrogen Receptor: 70%, POSITIVE, STRONG STAINING INTENSITY Progesterone Receptor: 70%, POSITIVE, STRONG STAINING INTENSITY Proliferation Marker Ki67: 20% REFERENCE RANGE ESTROGEN RECEPTOR NEGATIVE 0% POSITIVE =>1% REFERENCE RANGE PROGESTERONE  RECEPTOR NEGATIVE 0% POSITIVE =>1% All controls stained appropriately  1. Uterus +/- tubes/ovaries, neoplastic, cervix, bilateral fallopian tubes and ovaries - MIXED HIGH-GRADE SEROUS  AND ENDOMETRIAL ADENOCARCINOMA, 6.5 CM. SEE NOTE - CARCINOMA INVOLVES ENTIRE ENDOMETRIUM, ANTERIOR AND POSTERIOR LOWER UTERINE SEGMENTS AND ANTERIOR AND POSTERIOR CERVIX. - CARCINOMA INVADES FOR A DEPTH OF 1.9 CM WHERE MYOMETRIAL THICKNESS IS 2.0 CM (GREATER THAN 50% MYOMETRIAL INVASION) - CARCINOMA IS 1 MM FROM THE CERVICAL MARGIN - NEGATIVE FOR LYMPHOVASCULAR OR PERINEURAL INVASION - BILATERAL UNREMARKABLE FALLOPIAN TUBES AND OVARIES, NEGATIVE FOR CARCINOMA - SEE ONCOLOGY TABLE 2. Lymph node, biopsy, right pelvic - METASTATIC CARCINOMA TO ONE OF TWO LYMPH NODES (1/2) Microscopic Comment 1. (v4.1.0.0) UTERUS, CARCINOMA OR CARCINOSARCOMA Procedure: Total hysterectomy with bilateral salpingo-oophorectomy Histologic type: Mixed high-grade serous and endometrioid adenocarcinoma Histologic Grade: NA Myometrial invasion: Present Depth of invasion: 19 mm Myometrial thickness: 20 mm Uterine Serosa Involvement: Not identified Cervical stromal involvement: Present Extent of involvement of other organs: NA Lymphovascular invasion: Not identified Regional Lymph Nodes: Examined: 0 Sentinel 2 Non-sentinel 2 Total Lymph nodes with metastasis: 1 Isolated tumor cells (< 0.2 mm): 0 Micrometastasis: (> 0.2 mm and < 2.0 mm): 1 Macrometastasis: (> 2.0 mm): 0 Extracapsular extension: Not identified Tumor block for ancillary studies: 52F MMR / MSI testing: Pending Pathologic Stage Classification (pTNM, AJCC 8th edition): pT2, pN74m FIGO Stage: IIIC1 Representative Tumor Block: 1A, 1C   03/17/2019 Echocardiogram   1. The left ventricle has normal systolic function, with an ejection fraction of 55-60%. The cavity size was normal. There is moderate concentric left ventricular hypertrophy. Left ventricular diastolic  Doppler parameters are consistent with impaired relaxation.  2. The right ventricle has normal systolic function. The cavity was normal. There is no increase in right ventricular wall thickness.  3. GLS: -15.5%, no prior study available for comparison.   03/17/2019 Tumor Marker   Patient's tumor was tested for the following markers: CA-125 Results of the tumor marker test revealed 43   03/20/2019 -  Chemotherapy   The patient had trastuzumab (HERCEPTIN) 693 mg in sodium chloride 0.9 % 250 mL chemo infusion, 8 mg/kg = 693 mg, Intravenous,  Once, 5 of 5 cycles Dose modification: 6 mg/kg (original dose 6 mg/kg, Cycle 2, Reason: Provider Judgment) Administration: 693 mg (03/20/2019), 504 mg (04/10/2019), 504 mg (05/01/2019), 504 mg (05/21/2019), 504 mg (06/10/2019) trastuzumab-anns (KANJINTI) 504 mg in sodium chloride 0.9 % 250 mL chemo infusion, 6 mg/kg = 504 mg (100 % of original dose 6 mg/kg), Intravenous,  Once, 6 of 9 cycles Dose modification: 6 mg/kg (original dose 6 mg/kg, Cycle 6, Reason: Other (see comments), Comment: biosimilar conversion) Administration: 504 mg (07/02/2019), 504 mg (07/23/2019), 504 mg (08/13/2019), 504 mg (09/03/2019), 504 mg (09/24/2019), 504 mg (10/15/2019)  for chemotherapy treatment.    04/30/2019 Tumor Marker   Patient's tumor was tested for the following markers:CA-125 Results of the tumor marker test revealed 29   05/20/2019 Imaging   1. No evidence of metastatic disease. 2. Small pelvic and left paracolic gutter ascites, new. 3. Aortic atherosclerosis (ICD10-170.0). Coronary artery calcification.     05/26/2019 Echocardiogram      1. The left ventricle has normal systolic function with an ejection fraction of 60-65%. The cavity size was normal. There is mildly increased left ventricular wall thickness. Left ventricular diastolic parameters were normal.  2. GLS -16.4% (underestimated due to poor endocardial tracking).  3. The right ventricle has normal systolic function.  The cavity was normal. There is no increase in right ventricular wall thickness.  4. Mild calcification of the mitral valve leaflet.  5. Mild calcification of the aortic valve. No stenosis of the  aortic valve.  6. The aorta is normal in size and structure.   09/02/2019 Imaging   No evidence of recurrent or metastatic carcinoma within the abdomen or pelvis.   Colonic diverticulosis. No radiographic evidence of diverticulitis.   Increased large stool burden noted; recommend clinical correlation for possible constipation.   09/02/2019 Echocardiogram   1. Left ventricular ejection fraction, by visual estimation, is 60 to 65%. The left ventricle has normal function. There is no left ventricular hypertrophy.  2. Global right ventricle has normal systolic function.The right ventricular size is normal. No increase in right ventricular wall thickness.  3. Left atrial size was normal.  4. Right atrial size was normal.  5. The mitral valve is normal in structure. No evidence of mitral valve regurgitation.  6. The tricuspid valve is normal in structure. Tricuspid valve regurgitation is trivial.  7. The aortic valve is normal in structure. Aortic valve regurgitation is not visualized.  8. The pulmonic valve was normal in structure. Pulmonic valve regurgitation is trivial.  9. Mildly elevated pulmonary artery systolic pressure. 10. The average left ventricular global longitudinal strain is -26.9 %.   09/03/2019 Tumor Marker   Patient's tumor was tested for the following markers: CA-125 Results of the tumor marker test revealed 13.5     REVIEW OF SYSTEMS:   Constitutional: Denies fevers, chills or abnormal weight loss Eyes: Denies blurriness of vision Ears, nose, mouth, throat, and face: Denies mucositis or sore throat Respiratory: Denies cough, dyspnea or wheezes Cardiovascular: Denies palpitation, chest discomfort or lower extremity swelling Gastrointestinal:  Denies nausea, heartburn or  change in bowel habits Skin: Denies abnormal skin rashes Lymphatics: Denies new lymphadenopathy or easy bruising Neurological:Denies numbness, tingling or new weaknesses Behavioral/Psych: Mood is stable, no new changes  All other systems were reviewed with the patient and are negative.  I have reviewed the past medical history, past surgical history, social history and family history with the patient and they are unchanged from previous note.  ALLERGIES:  is allergic to tegaderm ag mesh [silver].  MEDICATIONS:  Current Outpatient Medications  Medication Sig Dispense Refill  . diphenhydrAMINE (BENADRYL) 25 mg capsule Take 100 mg by mouth 2 (two) times a day.    . furosemide (LASIX) 20 MG tablet Take 1 tablet (20 mg total) by mouth daily. 30 tablet 1  . gabapentin (NEURONTIN) 400 MG capsule Take 3 capsules (1,200 mg total) by mouth 3 (three) times daily. 270 capsule 11  . lidocaine-prilocaine (EMLA) cream Apply to affected area once 30 g 3  . losartan-hydrochlorothiazide (HYZAAR) 50-12.5 MG tablet Take 2 tablets by mouth daily.    . ondansetron (ZOFRAN) 8 MG tablet Take 8 mg by mouth every 8 (eight) hours as needed.    Marland Kitchen oxyCODONE-acetaminophen (PERCOCET) 10-325 MG tablet Take 1-2 tablets by mouth every 6 (six) hours as needed for pain.    Marland Kitchen prochlorperazine (COMPAZINE) 10 MG tablet TAKE 1 TABLET BY MOUTH EVERY 6 HOURS AS NEEDED FOR NAUSEA OR VOMITING 30 tablet 1   No current facility-administered medications for this visit.   Facility-Administered Medications Ordered in Other Visits  Medication Dose Route Frequency Provider Last Rate Last Admin  . acetaminophen (TYLENOL) tablet 650 mg  650 mg Oral Once Alvy Bimler, Shajuana Mclucas, MD      . diphenhydrAMINE (BENADRYL) capsule 25 mg  25 mg Oral Once Demitrios Molyneux, MD      . sodium chloride flush (NS) 0.9 % injection 10 mL  10 mL Intracatheter PRN Heath Lark, MD  10 mL at 10/15/19 1329    PHYSICAL EXAMINATION: ECOG PERFORMANCE STATUS: 1 - Symptomatic  but completely ambulatory  Vitals:   10/15/19 1107  BP: 138/76  Pulse: 64  Resp: 18  Temp: 97.8 F (36.6 C)   Filed Weights   10/15/19 1107  Weight: 290 lb 6.4 oz (131.7 kg)    GENERAL:alert, no distress and comfortable SKIN: skin color, texture, turgor are normal, no rashes or significant lesions EYES: normal, Conjunctiva are pink and non-injected, sclera clear OROPHARYNX:no exudate, no erythema and lips, buccal mucosa, and tongue normal  NECK: supple, thyroid normal size, non-tender, without nodularity LYMPH:  no palpable lymphadenopathy in the cervical, axillary or inguinal LUNGS: clear to auscultation and percussion with normal breathing effort HEART: regular rate & rhythm and no murmurs and no lower extremity edema ABDOMEN:abdomen soft, non-tender and normal bowel sounds Musculoskeletal:no cyanosis of digits and no clubbing  NEURO: alert & oriented x 3 with fluent speech, no focal motor/sensory deficits  LABORATORY DATA:  I have reviewed the data as listed    Component Value Date/Time   NA 140 10/15/2019 1013   K 3.4 (L) 10/15/2019 1013   CL 101 10/15/2019 1013   CO2 29 10/15/2019 1013   GLUCOSE 120 (H) 10/15/2019 1013   BUN 18 10/15/2019 1013   CREATININE 0.85 10/15/2019 1013   CALCIUM 8.8 (L) 10/15/2019 1013   PROT 7.0 10/15/2019 1013   ALBUMIN 3.8 10/15/2019 1013   AST 14 (L) 10/15/2019 1013   ALT 14 10/15/2019 1013   ALKPHOS 95 10/15/2019 1013   BILITOT 0.3 10/15/2019 1013   GFRNONAA >60 10/15/2019 1013   GFRAA >60 10/15/2019 1013    No results found for: SPEP, UPEP  Lab Results  Component Value Date   WBC 4.5 10/15/2019   NEUTROABS 2.4 10/15/2019   HGB 11.9 (L) 10/15/2019   HCT 37.3 10/15/2019   MCV 96.6 10/15/2019   PLT 183 10/15/2019      Chemistry      Component Value Date/Time   NA 140 10/15/2019 1013   K 3.4 (L) 10/15/2019 1013   CL 101 10/15/2019 1013   CO2 29 10/15/2019 1013   BUN 18 10/15/2019 1013   CREATININE 0.85 10/15/2019  1013      Component Value Date/Time   CALCIUM 8.8 (L) 10/15/2019 1013   ALKPHOS 95 10/15/2019 1013   AST 14 (L) 10/15/2019 1013   ALT 14 10/15/2019 1013   BILITOT 0.3 10/15/2019 1013      All questions were answered. The patient knows to call the clinic with any problems, questions or concerns. No barriers to learning was detected.  I spent 15 minutes counseling the patient face to face. The total time spent in the appointment was 20 minutes and more than 50% was on counseling and review of test results  Heath Lark, MD 10/15/2019 5:16 PM

## 2019-10-15 NOTE — Assessment & Plan Note (Signed)
Her last Ct imaging showed no signs of cancer recurrence Echocardiogram is normal We will continue Herceptin indefinitely. Her next echocardiogram will be due around mid February and I plan to delay her next CT imaging until March unless she have new signs or symptoms to suggest cancer progression

## 2019-10-19 DIAGNOSIS — M5416 Radiculopathy, lumbar region: Secondary | ICD-10-CM | POA: Diagnosis not present

## 2019-10-19 DIAGNOSIS — G894 Chronic pain syndrome: Secondary | ICD-10-CM | POA: Diagnosis not present

## 2019-10-19 DIAGNOSIS — Z79891 Long term (current) use of opiate analgesic: Secondary | ICD-10-CM | POA: Diagnosis not present

## 2019-10-19 DIAGNOSIS — M961 Postlaminectomy syndrome, not elsewhere classified: Secondary | ICD-10-CM | POA: Diagnosis not present

## 2019-11-05 ENCOUNTER — Inpatient Hospital Stay: Payer: Medicare Other

## 2019-11-05 ENCOUNTER — Other Ambulatory Visit: Payer: Self-pay

## 2019-11-05 ENCOUNTER — Encounter: Payer: Self-pay | Admitting: Hematology and Oncology

## 2019-11-05 ENCOUNTER — Inpatient Hospital Stay: Payer: Medicare Other | Attending: Hematology and Oncology

## 2019-11-05 ENCOUNTER — Inpatient Hospital Stay: Payer: Medicare Other | Admitting: Hematology and Oncology

## 2019-11-05 VITALS — HR 98 | Resp 18

## 2019-11-05 DIAGNOSIS — I10 Essential (primary) hypertension: Secondary | ICD-10-CM | POA: Diagnosis not present

## 2019-11-05 DIAGNOSIS — C541 Malignant neoplasm of endometrium: Secondary | ICD-10-CM

## 2019-11-05 DIAGNOSIS — Z5112 Encounter for antineoplastic immunotherapy: Secondary | ICD-10-CM | POA: Insufficient documentation

## 2019-11-05 DIAGNOSIS — Z9221 Personal history of antineoplastic chemotherapy: Secondary | ICD-10-CM | POA: Diagnosis not present

## 2019-11-05 DIAGNOSIS — R600 Localized edema: Secondary | ICD-10-CM

## 2019-11-05 DIAGNOSIS — Z79899 Other long term (current) drug therapy: Secondary | ICD-10-CM | POA: Diagnosis not present

## 2019-11-05 LAB — CMP (CANCER CENTER ONLY)
ALT: 14 U/L (ref 0–44)
AST: 14 U/L — ABNORMAL LOW (ref 15–41)
Albumin: 3.8 g/dL (ref 3.5–5.0)
Alkaline Phosphatase: 104 U/L (ref 38–126)
Anion gap: 10 (ref 5–15)
BUN: 15 mg/dL (ref 8–23)
CO2: 30 mmol/L (ref 22–32)
Calcium: 9.2 mg/dL (ref 8.9–10.3)
Chloride: 102 mmol/L (ref 98–111)
Creatinine: 0.77 mg/dL (ref 0.44–1.00)
GFR, Est AFR Am: >60 mL/min (ref >60)
GFR, Estimated: >60 mL/min (ref >60)
Glucose, Bld: 147 mg/dL — ABNORMAL HIGH (ref 70–99)
Potassium: 3.4 mmol/L — ABNORMAL LOW (ref 3.5–5.1)
Sodium: 142 mmol/L (ref 135–145)
Total Bilirubin: 0.3 mg/dL (ref 0.3–1.2)
Total Protein: 7.3 g/dL (ref 6.5–8.1)

## 2019-11-05 LAB — CBC WITH DIFFERENTIAL (CANCER CENTER ONLY)
Abs Immature Granulocytes: 0 10*3/uL (ref 0.00–0.07)
Basophils Absolute: 0 10*3/uL (ref 0.0–0.1)
Basophils Relative: 0 %
Eosinophils Absolute: 0.1 10*3/uL (ref 0.0–0.5)
Eosinophils Relative: 2 %
HCT: 38.7 % (ref 36.0–46.0)
Hemoglobin: 12.5 g/dL (ref 12.0–15.0)
Immature Granulocytes: 0 %
Lymphocytes Relative: 37 %
Lymphs Abs: 1.7 10*3/uL (ref 0.7–4.0)
MCH: 30.9 pg (ref 26.0–34.0)
MCHC: 32.3 g/dL (ref 30.0–36.0)
MCV: 95.6 fL (ref 80.0–100.0)
Monocytes Absolute: 0.5 10*3/uL (ref 0.1–1.0)
Monocytes Relative: 10 %
Neutro Abs: 2.4 10*3/uL (ref 1.7–7.7)
Neutrophils Relative %: 51 %
Platelet Count: 171 10*3/uL (ref 150–400)
RBC: 4.05 MIL/uL (ref 3.87–5.11)
RDW: 14.6 % (ref 11.5–15.5)
WBC Count: 4.6 10*3/uL (ref 4.0–10.5)
nRBC: 0 % (ref 0.0–0.2)

## 2019-11-05 MED ORDER — SODIUM CHLORIDE 0.9% FLUSH
10.0000 mL | INTRAVENOUS | Status: DC | PRN
  Administered 2019-11-05: 15:00:00 10 mL
  Filled 2019-11-05: qty 10

## 2019-11-05 MED ORDER — ACETAMINOPHEN 325 MG PO TABS
650.0000 mg | ORAL_TABLET | Freq: Once | ORAL | Status: AC
  Administered 2019-11-05: 14:00:00 650 mg via ORAL

## 2019-11-05 MED ORDER — SODIUM CHLORIDE 0.9% FLUSH
10.0000 mL | Freq: Once | INTRAVENOUS | Status: AC
  Administered 2019-11-05: 10 mL
  Filled 2019-11-05: qty 10

## 2019-11-05 MED ORDER — SODIUM CHLORIDE 0.9 % IV SOLN
Freq: Once | INTRAVENOUS | Status: AC
  Filled 2019-11-05: qty 250

## 2019-11-05 MED ORDER — TRASTUZUMAB-ANNS CHEMO 150 MG IV SOLR
6.0000 mg/kg | Freq: Once | INTRAVENOUS | Status: AC
  Administered 2019-11-05: 15:00:00 504 mg via INTRAVENOUS
  Filled 2019-11-05: qty 24

## 2019-11-05 MED ORDER — HEPARIN SOD (PORK) LOCK FLUSH 100 UNIT/ML IV SOLN
500.0000 [IU] | Freq: Once | INTRAVENOUS | Status: AC | PRN
  Administered 2019-11-05: 15:00:00 500 [IU]
  Filled 2019-11-05: qty 5

## 2019-11-05 MED ORDER — DIPHENHYDRAMINE HCL 25 MG PO CAPS
ORAL_CAPSULE | ORAL | Status: AC
  Filled 2019-11-05: qty 2

## 2019-11-05 MED ORDER — ACETAMINOPHEN 325 MG PO TABS
ORAL_TABLET | ORAL | Status: AC
  Filled 2019-11-05: qty 2

## 2019-11-05 MED ORDER — DIPHENHYDRAMINE HCL 25 MG PO CAPS
25.0000 mg | ORAL_CAPSULE | Freq: Once | ORAL | Status: AC
  Administered 2019-11-05: 25 mg via ORAL

## 2019-11-05 NOTE — Patient Instructions (Signed)
[facility] °Discharge Instructions for Patients Receiving Chemotherapy ° °Today you received the following chemotherapy agents Trastuzumab ° °To help prevent nausea and vomiting after your treatment, we encourage you to take your nausea medication as directed. °  °If you develop nausea and vomiting that is not controlled by your nausea medication, call the clinic.  ° °BELOW ARE SYMPTOMS THAT SHOULD BE REPORTED IMMEDIATELY: °· *FEVER GREATER THAN 100.5 F °· *CHILLS WITH OR WITHOUT FEVER °· NAUSEA AND VOMITING THAT IS NOT CONTROLLED WITH YOUR NAUSEA MEDICATION °· *UNUSUAL SHORTNESS OF BREATH °· *UNUSUAL BRUISING OR BLEEDING °· TENDERNESS IN MOUTH AND THROAT WITH OR WITHOUT PRESENCE OF ULCERS °· *URINARY PROBLEMS °· *BOWEL PROBLEMS °· UNUSUAL RASH °Items with * indicate a potential emergency and should be followed up as soon as possible. ° °Feel free to call the clinic should you have any questions or concerns. The clinic phone number is [phone]. ° °Please show the CHEMO ALERT CARD at check-in to the Emergency Department and triage nurse. ° ° °

## 2019-11-05 NOTE — Assessment & Plan Note (Signed)
She has mild bilateral lower extremity edema She will continue intermittent doses of diuretic I recommend aggressive blood pressure monitoring at home

## 2019-11-05 NOTE — Progress Notes (Signed)
[place] OFFICE PROGRESS NOTE  Patient Care Team: Ronita Hipps, MD as PCP - General (Family Medicine) Nicholaus Bloom, MD (Anesthesiology)  ASSESSMENT & PLAN:  Endometrial cancer [facility]) Her last Ct imaging showed no signs of cancer recurrence Echocardiogram is normal We will continue Herceptin indefinitely. Her next echocardiogram will be due around mid February and I plan to delay her next CT imaging until March unless she have new signs or symptoms to suggest cancer progression  Fluid retention in legs She has mild bilateral lower extremity edema She will continue intermittent doses of diuretic I recommend aggressive blood pressure monitoring at home  Essential hypertension Her blood pressure is stable, although a little bit elevated She will continue low-dose diuretic therapy   No orders of the defined types were placed in this encounter.   All questions were answered. The patient knows to call the clinic with any problems, questions or concerns. The total time spent in the appointment was 15 minutes encounter with patients including review of chart and various tests results, discussions about plan of care and coordination of care plan   Heath Lark, MD 11/05/2019 2:25 PM  INTERVAL HISTORY: Please see below for problem oriented charting. 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 A Pap was done 09/02/18 showign ASC-H. She was referred onto GYN and seen by Dr. Paula Compton    09/09/2018  Procedure   She underwent endometrial biopsy   09/12/2018 Pathology Results   Endometrial biopsy positive for adenocarcinoma   09/22/2018 Imaging   Ct abdomen and pelvis showed: Diffuse endometrial thickening, consistent with primary endometrial carcinoma.  Pelvic and abdominal retroperitoneal and retrocrural lymphadenopathy, consistent with metastatic disease.  Moderate hepatic steatosis.   09/25/2018 Cancer Staging   Staging form: Corpus Uteri - Carcinoma and Carcinosarcoma, AJCC 8th Edition - Pathologic: Stage IIIC2 (pT2, pN2, cM0) - Signed by Heath Lark, MD on 02/23/2019   10/01/2018 Procedure   Successful placement of a right IJ approach Power Port with ultrasound and fluoroscopic guidance. The catheter is ready for use.   10/02/2018 Tumor Marker   Patient's tumor was tested for the following markers: CA-125 Results of the tumor marker test revealed 578   10/03/2018 - 01/16/2019 Chemotherapy   The patient had carboplatin and Taxol with dose adjustment due to neuropathy x 6 cycles   10/24/2018 Tumor Marker   Patient's tumor was tested for the following markers: CA-125 Results of the tumor marker test revealed 296    Genetic Testing   Patient has genetic testing done for MSI/MMR. Results revealed MSI stable and MMR normal on Accession [id number].   11/14/2018 Tumor Marker   Patient's tumor was tested for the following markers: CA-125 Results of the tumor marker test revealed 81   11/27/2018 Imaging   1. Significant decrease in abdominal retroperitoneal and bilateral iliac lymphadenopathy since previous study. 2. Significant decrease in abnormal endometrial soft tissue density since prior study. 3. No new or progressive metastatic disease identified.   12/05/2018 Tumor Marker   Patient's tumor was tested for  the following markers: CA-125 Results of the tumor marker test revealed 46.3   12/26/2018 Tumor Marker   Patient's tumor was tested for the following markers:  CA-125 Results of the tumor marker test revealed 38.2   01/16/2019 Tumor Marker   Patient's tumor was tested for the following markers: CA-125 Results of the tumor marker test revealed 30.8   02/03/2019 Imaging   1. Numerous retroperitoneal, iliac, and pelvic lymph nodes are stable or slightly decreased in size compared to immediate prior examination dated 11/27/2018 and significantly decreased in size compared to exam dated 09/20/2018.  2.  Unchanged thickening of the endometrium.  3. Other chronic, incidental, and postoperative findings as detailed above.   02/12/2019 Imaging   1. Uterus +/- tubes/ovaries, neoplastic, cervix, bilateral fallopian tubes and ovaries - MIXED HIGH-GRADE SEROUS AND ENDOMETRIAL ADENOCARCINOMA, 6.5 CM. SEE NOTE - CARCINOMA INVOLVES ENTIRE ENDOMETRIUM, ANTERIOR AND POSTERIOR LOWER UTERINE SEGMENTS AND ANTERIOR AND POSTERIOR CERVIX. - CARCINOMA INVADES FOR A DEPTH OF 1.9 CM WHERE MYOMETRIAL THICKNESS IS 2.0 CM (GREATER THAN 50% MYOMETRIAL INVASION) - CARCINOMA IS 1 MM FROM THE CERVICAL MARGIN - NEGATIVE FOR LYMPHOVASCULAR OR PERINEURAL INVASION - BILATERAL UNREMARKABLE FALLOPIAN TUBES AND OVARIES, NEGATIVE FOR CARCINOMA - SEE ONCOLOGY TABLE 2. Lymph node, biopsy, right pelvic - METASTATIC CARCINOMA TO ONE OF TWO LYMPH NODES (1/2) Microscopic Comment 1. (v4.1.0.0) UTERUS, CARCINOMA OR CARCINOSARCOMA Procedure: Total hysterectomy with bilateral salpingo-oophorectomy Histologic type: Mixed high-grade serous and endometrioid adenocarcinoma Histologic Grade: NA Myometrial invasion: Present Depth of invasion: 19 mm Myometrial thickness: 20 mm Uterine Serosa Involvement: Not identified Cervical stromal involvement: Present Extent of involvement of other organs: NA Lymphovascular invasion: Not identified Regional Lymph Nodes: Examined: 0 Sentinel 2 Non-sentinel 2 Total Lymph nodes with metastasis: 1 Isolated tumor cells (< 0.2 mm): 0 Micrometastasis: (>  0.2 mm and < 2.0 mm): 1 Macrometastasis: (> 2.0 mm): 0 Extracapsular extension: Not identified Tumor block for ancillary studies: 16F MMR / MSI testing: Pending Pathologic Stage Classification (pTNM, AJCC 8th edition): pT2, pN35m FIGO Stage: IIIC1 Representative Tumor Block: 1A, 1C (v4.1.0.0) Diagnosis Note 1. Immunohistochemical stains for p16, p53, ER and Ki-67 show a pattern of staining consistent with mixed high-grade serous and endometrial adenocarcinoma. Dr. KLyndon Codehas reviewed this case and concurs with the above interpretation. A molecular study for microsatellite instability (MSI) and immunostains for MMR-related proteins are pending and will be reported in an addendum.   02/12/2019 Surgery   Pre-operative Diagnosis: endometrial cancer stage IIIC2, s/p neoadjuvant chemotherapy, extreme obesity (BMI 53kg/m2)  Operation: Robotic-assisted laparoscopic total hysterectomy with bilateral salpingoophorectomy, right pelvic lymphadenectomy. 22 modifier for extreme obesity  Surgeon: RDonaciano Eva Assistant Surgeon: LLahoma CrockerMD  Operative Findings:  : extreme obesity, BMI 53kg/m2, with significant retroperitoneal and intraperitoneal adiposity. Obesity necessitated an additional hour of operating time to create safe exposure. Obesity required additional personnel in the operating room for positioning and retraction. Severe obesity substantially increased the complexity of the procedure. 8cm uterus, grossly normal, normal appearing cervix. Retroperitoneal fibrosis consistent with nodal positivity. Clinically suspicious right pelvic lymph node. Unable to completely visualize retroperitoneal nodes due to extreme obesity.    02/12/2019 Pathology Results   IMMUNOHISTOCHEMICAL AND MORPHOMETRIC ANALYSIS PERFORMED MANUALLY The tumor cells are POSITIVE for Her2 (3+). Estrogen Receptor: 70%, POSITIVE, STRONG STAINING INTENSITY Progesterone Receptor: 70%, POSITIVE, STRONG STAINING  INTENSITY Proliferation Marker Ki67: 20% REFERENCE RANGE ESTROGEN RECEPTOR NEGATIVE 0% POSITIVE =>1% REFERENCE RANGE PROGESTERONE RECEPTOR NEGATIVE 0% POSITIVE =>1% All controls stained appropriately  1.  Uterus +/- tubes/ovaries, neoplastic, cervix, bilateral fallopian tubes and ovaries - MIXED HIGH-GRADE SEROUS AND ENDOMETRIAL ADENOCARCINOMA, 6.5 CM. SEE NOTE - CARCINOMA INVOLVES ENTIRE ENDOMETRIUM, ANTERIOR AND POSTERIOR LOWER UTERINE SEGMENTS AND ANTERIOR AND POSTERIOR CERVIX. - CARCINOMA INVADES FOR A DEPTH OF 1.9 CM WHERE MYOMETRIAL THICKNESS IS 2.0 CM (GREATER THAN 50% MYOMETRIAL INVASION) - CARCINOMA IS 1 MM FROM THE CERVICAL MARGIN - NEGATIVE FOR LYMPHOVASCULAR OR PERINEURAL INVASION - BILATERAL UNREMARKABLE FALLOPIAN TUBES AND OVARIES, NEGATIVE FOR CARCINOMA - SEE ONCOLOGY TABLE 2. Lymph node, biopsy, right pelvic - METASTATIC CARCINOMA TO ONE OF TWO LYMPH NODES (1/2) Microscopic Comment 1. (v4.1.0.0) UTERUS, CARCINOMA OR CARCINOSARCOMA Procedure: Total hysterectomy with bilateral salpingo-oophorectomy Histologic type: Mixed high-grade serous and endometrioid adenocarcinoma Histologic Grade: NA Myometrial invasion: Present Depth of invasion: 19 mm Myometrial thickness: 20 mm Uterine Serosa Involvement: Not identified Cervical stromal involvement: Present Extent of involvement of other organs: NA Lymphovascular invasion: Not identified Regional Lymph Nodes: Examined: 0 Sentinel 2 Non-sentinel 2 Total Lymph nodes with metastasis: 1 Isolated tumor cells (< 0.2 mm): 0 Micrometastasis: (> 0.2 mm and < 2.0 mm): 1 Macrometastasis: (> 2.0 mm): 0 Extracapsular extension: Not identified Tumor block for ancillary studies: 67F MMR / MSI testing: Pending Pathologic Stage Classification (pTNM, AJCC 8th edition): pT2, pN18m FIGO Stage: IIIC1 Representative Tumor Block: 1A, 1C   03/17/2019 Echocardiogram   1. The left ventricle has normal systolic function, with an ejection  fraction of 55-60%. The cavity size was normal. There is moderate concentric left ventricular hypertrophy. Left ventricular diastolic Doppler parameters are consistent with impaired relaxation.  2. The right ventricle has normal systolic function. The cavity was normal. There is no increase in right ventricular wall thickness.  3. GLS: -15.5%, no prior study available for comparison.   03/17/2019 Tumor Marker   Patient's tumor was tested for the following markers: CA-125 Results of the tumor marker test revealed 43   03/20/2019 -  Chemotherapy   The patient had trastuzumab (HERCEPTIN) 693 mg in sodium chloride 0.9 % 250 mL chemo infusion, 8 mg/kg = 693 mg, Intravenous,  Once, 5 of 5 cycles Dose modification: 6 mg/kg (original dose 6 mg/kg, Cycle 2, Reason: Provider Judgment) Administration: 693 mg (03/20/2019), 504 mg (04/10/2019), 504 mg (05/01/2019), 504 mg (05/21/2019), 504 mg (06/10/2019) trastuzumab-anns (KANJINTI) 504 mg in sodium chloride 0.9 % 250 mL chemo infusion, 6 mg/kg = 504 mg (100 % of original dose 6 mg/kg), Intravenous,  Once, 7 of 9 cycles Dose modification: 6 mg/kg (original dose 6 mg/kg, Cycle 6, Reason: Other (see comments), Comment: biosimilar conversion) Administration: 504 mg (07/02/2019), 504 mg (07/23/2019), 504 mg (08/13/2019), 504 mg (09/03/2019), 504 mg (09/24/2019), 504 mg (10/15/2019)  for chemotherapy treatment.    04/30/2019 Tumor Marker   Patient's tumor was tested for the following markers:CA-125 Results of the tumor marker test revealed 29   05/20/2019 Imaging   1. No evidence of metastatic disease. 2. Small pelvic and left paracolic gutter ascites, new. 3. Aortic atherosclerosis (ICD10-170.0). Coronary artery calcification.     05/26/2019 Echocardiogram      1. The left ventricle has normal systolic function with an ejection fraction of 60-65%. The cavity size was normal. There is mildly increased left ventricular wall thickness. Left ventricular diastolic parameters  were normal.  2. GLS -16.4% (underestimated due to poor endocardial tracking).  3. The right ventricle has normal systolic function. The cavity was normal. There is no increase in right ventricular wall thickness.  4. Mild calcification of the mitral  valve leaflet.  5. Mild calcification of the aortic valve. No stenosis of the aortic valve.  6. The aorta is normal in size and structure.   09/02/2019 Imaging   No evidence of recurrent or metastatic carcinoma within the abdomen or pelvis.   Colonic diverticulosis. No radiographic evidence of diverticulitis.   Increased large stool burden noted; recommend clinical correlation for possible constipation.   09/02/2019 Echocardiogram   1. Left ventricular ejection fraction, by visual estimation, is 60 to 65%. The left ventricle has normal function. There is no left ventricular hypertrophy.  2. Global right ventricle has normal systolic function.The right ventricular size is normal. No increase in right ventricular wall thickness.  3. Left atrial size was normal.  4. Right atrial size was normal.  5. The mitral valve is normal in structure. No evidence of mitral valve regurgitation.  6. The tricuspid valve is normal in structure. Tricuspid valve regurgitation is trivial.  7. The aortic valve is normal in structure. Aortic valve regurgitation is not visualized.  8. The pulmonic valve was normal in structure. Pulmonic valve regurgitation is trivial.  9. Mildly elevated pulmonary artery systolic pressure. 10. The average left ventricular global longitudinal strain is -26.9 %.   09/03/2019 Tumor Marker   Patient's tumor was tested for the following markers: CA-125 Results of the tumor marker test revealed 13.5     REVIEW OF SYSTEMS:   Constitutional: Denies fevers, chills or abnormal weight loss Eyes: Denies blurriness of vision Ears, nose, mouth, throat, and face: Denies mucositis or sore throat Respiratory: Denies cough, dyspnea or  wheezes Cardiovascular: Denies palpitation, chest discomfort or lower extremity swelling Gastrointestinal:  Denies nausea, heartburn or change in bowel habits Skin: Denies abnormal skin rashes Lymphatics: Denies new lymphadenopathy or easy bruising Neurological:Denies numbness, tingling or new weaknesses Behavioral/Psych: Mood is stable, no new changes  All other systems were reviewed with the patient and are negative.  I have reviewed the past medical history, past surgical history, social history and family history with the patient and they are unchanged from previous note.  ALLERGIES:  is allergic to tegaderm ag mesh [silver].  MEDICATIONS:  Current Outpatient Medications  Medication Sig Dispense Refill  . diphenhydrAMINE (BENADRYL) 25 mg capsule Take 100 mg by mouth 2 (two) times a day.    . furosemide (LASIX) 20 MG tablet Take 1 tablet (20 mg total) by mouth daily. 30 tablet 1  . gabapentin (NEURONTIN) 400 MG capsule Take 3 capsules (1,200 mg total) by mouth 3 (three) times daily. 270 capsule 11  . lidocaine-prilocaine (EMLA) cream Apply to affected area once 30 g 3  . losartan-hydrochlorothiazide (HYZAAR) 50-12.5 MG tablet Take 2 tablets by mouth daily.    . ondansetron (ZOFRAN) 8 MG tablet Take 8 mg by mouth every 8 (eight) hours as needed.    Marland Kitchen oxyCODONE-acetaminophen (PERCOCET) 10-325 MG tablet Take 1-2 tablets by mouth every 6 (six) hours as needed for pain.    Marland Kitchen prochlorperazine (COMPAZINE) 10 MG tablet TAKE 1 TABLET BY MOUTH EVERY 6 HOURS AS NEEDED FOR NAUSEA OR VOMITING 30 tablet 1   No current facility-administered medications for this visit.   Facility-Administered Medications Ordered in Other Visits  Medication Dose Route Frequency Provider Last Rate Last Admin  . trastuzumab-anns (KANJINTI) 504 mg in sodium chloride 0.9 % 250 mL chemo infusion  6 mg/kg (Treatment Plan Adjusted) Intravenous Once Heath Lark, MD        PHYSICAL EXAMINATION: ECOG PERFORMANCE STATUS: 1 -  Symptomatic but completely  ambulatory  Vitals:   11/05/19 1234  BP: (!) 154/91  Pulse: (!) 18  Resp: (!) 98  SpO2: 99%   Filed Weights   11/05/19 1234  Weight: 292 lb 3.2 oz (132.5 kg)    GENERAL:alert, no distress and comfortable SKIN: skin color, texture, turgor are normal, no rashes or significant lesions EYES: normal, Conjunctiva are pink and non-injected, sclera clear OROPHARYNX:no exudate, no erythema and lips, buccal mucosa, and tongue normal  NECK: supple, thyroid normal size, non-tender, without nodularity LYMPH:  no palpable lymphadenopathy in the cervical, axillary or inguinal LUNGS: clear to auscultation and percussion with normal breathing effort HEART: regular rate & rhythm and no murmurs with mild bilateral lower extremity edema ABDOMEN:abdomen soft, non-tender and normal bowel sounds Musculoskeletal:no cyanosis of digits and no clubbing  NEURO: alert & oriented x 3 with fluent speech, no focal motor/sensory deficits  LABORATORY DATA:  I have reviewed the data as listed    Component Value Date/Time   NA 142 11/05/2019 1202   K 3.4 (L) 11/05/2019 1202   CL 102 11/05/2019 1202   CO2 30 11/05/2019 1202   GLUCOSE 147 (H) 11/05/2019 1202   BUN 15 11/05/2019 1202   CREATININE 0.77 11/05/2019 1202   CALCIUM 9.2 11/05/2019 1202   PROT 7.3 11/05/2019 1202   ALBUMIN 3.8 11/05/2019 1202   AST 14 (L) 11/05/2019 1202   ALT 14 11/05/2019 1202   ALKPHOS 104 11/05/2019 1202   BILITOT 0.3 11/05/2019 1202   GFRNONAA >60 11/05/2019 1202   GFRAA >60 11/05/2019 1202    No results found for: SPEP, UPEP  Lab Results  Component Value Date   WBC 4.6 11/05/2019   NEUTROABS 2.4 11/05/2019   HGB 12.5 11/05/2019   HCT 38.7 11/05/2019   MCV 95.6 11/05/2019   PLT 171 11/05/2019      Chemistry      Component Value Date/Time   NA 142 11/05/2019 1202   K 3.4 (L) 11/05/2019 1202   CL 102 11/05/2019 1202   CO2 30 11/05/2019 1202   BUN 15 11/05/2019 1202   CREATININE  0.77 11/05/2019 1202      Component Value Date/Time   CALCIUM 9.2 11/05/2019 1202   ALKPHOS 104 11/05/2019 1202   AST 14 (L) 11/05/2019 1202   ALT 14 11/05/2019 1202   BILITOT 0.3 11/05/2019 1202

## 2019-11-05 NOTE — Assessment & Plan Note (Signed)
Her blood pressure is stable, although a little bit elevated She will continue low-dose diuretic therapy

## 2019-11-05 NOTE — Assessment & Plan Note (Signed)
Her last Ct imaging showed no signs of cancer recurrence Echocardiogram is normal We will continue Herceptin indefinitely. Her next echocardiogram will be due around mid February and I plan to delay her next CT imaging until March unless she have new signs or symptoms to suggest cancer progression

## 2019-11-06 ENCOUNTER — Telehealth: Payer: Self-pay | Admitting: Oncology

## 2019-11-06 ENCOUNTER — Other Ambulatory Visit: Payer: Self-pay | Admitting: Hematology and Oncology

## 2019-11-06 DIAGNOSIS — C541 Malignant neoplasm of endometrium: Secondary | ICD-10-CM

## 2019-11-06 LAB — CA 125: Cancer Antigen (CA) 125: 27.1 U/mL (ref 0.0–38.1)

## 2019-11-06 NOTE — Telephone Encounter (Signed)
Notified Kathryn [name] of CA 125 result from yesterday.  Advised her that Dr. Alvy Bimler has ordered a CT scan to be done on 11/25/19 before her next appointments.  Gave her central scheduling's number to call.  She verbalized understanding and agreement of result and instructions.

## 2019-11-16 ENCOUNTER — Telehealth: Payer: Self-pay | Admitting: Oncology

## 2019-11-16 NOTE — Telephone Encounter (Signed)
Kathryn [name] called and said that her son, Kathryn [name], will pick up the contrast for her CT scan on Wednesday this week.  Advised her that we will have it ready for him at the front desk.

## 2019-11-25 ENCOUNTER — Other Ambulatory Visit: Payer: Self-pay

## 2019-11-25 ENCOUNTER — Ambulatory Visit (HOSPITAL_COMMUNITY)
Admission: RE | Admit: 2019-11-25 | Discharge: 2019-11-25 | Disposition: A | Discharge status: Home / Self Care | Payer: Medicare Other | Source: Ambulatory Visit | Attending: Hematology and Oncology | Admitting: Hematology and Oncology

## 2019-11-25 DIAGNOSIS — C541 Malignant neoplasm of endometrium: Secondary | ICD-10-CM | POA: Insufficient documentation

## 2019-11-25 MED ORDER — IOHEXOL 300 MG/ML  SOLN
100.0000 mL | Freq: Once | INTRAMUSCULAR | Status: AC | PRN
  Administered 2019-11-25: 09:00:00 100 mL via INTRAVENOUS

## 2019-11-25 MED ORDER — HEPARIN SOD (PORK) LOCK FLUSH 100 UNIT/ML IV SOLN
INTRAVENOUS | Status: AC
  Filled 2019-11-25: qty 5

## 2019-11-25 MED ORDER — HEPARIN SOD (PORK) LOCK FLUSH 100 UNIT/ML IV SOLN
500.0000 [IU] | Freq: Once | INTRAVENOUS | Status: AC
  Administered 2019-11-25: 09:00:00 500 [IU] via INTRAVENOUS

## 2019-11-25 MED ORDER — SODIUM CHLORIDE (PF) 0.9 % IJ SOLN
INTRAMUSCULAR | Status: AC
  Filled 2019-11-25: qty 50

## 2019-11-26 ENCOUNTER — Inpatient Hospital Stay: Payer: Medicare Other | Attending: Hematology and Oncology | Admitting: Hematology and Oncology

## 2019-11-26 ENCOUNTER — Inpatient Hospital Stay: Payer: Medicare Other

## 2019-11-26 ENCOUNTER — Other Ambulatory Visit: Payer: Self-pay

## 2019-11-26 ENCOUNTER — Telehealth: Payer: Self-pay | Admitting: Oncology

## 2019-11-26 ENCOUNTER — Encounter: Payer: Self-pay | Admitting: Hematology and Oncology

## 2019-11-26 VITALS — BP 157/79 | HR 76 | Temp 97.3°F | Resp 17 | Ht 63.0 in | Wt 292.2 lb

## 2019-11-26 DIAGNOSIS — G629 Polyneuropathy, unspecified: Secondary | ICD-10-CM | POA: Diagnosis not present

## 2019-11-26 DIAGNOSIS — T380X5A Adverse effect of glucocorticoids and synthetic analogues, initial encounter: Secondary | ICD-10-CM | POA: Diagnosis not present

## 2019-11-26 DIAGNOSIS — R911 Solitary pulmonary nodule: Secondary | ICD-10-CM | POA: Insufficient documentation

## 2019-11-26 DIAGNOSIS — C541 Malignant neoplasm of endometrium: Secondary | ICD-10-CM

## 2019-11-26 DIAGNOSIS — Z79899 Other long term (current) drug therapy: Secondary | ICD-10-CM | POA: Diagnosis not present

## 2019-11-26 DIAGNOSIS — E669 Obesity, unspecified: Secondary | ICD-10-CM | POA: Diagnosis not present

## 2019-11-26 DIAGNOSIS — R739 Hyperglycemia, unspecified: Secondary | ICD-10-CM | POA: Diagnosis not present

## 2019-11-26 DIAGNOSIS — Z9221 Personal history of antineoplastic chemotherapy: Secondary | ICD-10-CM | POA: Insufficient documentation

## 2019-11-26 DIAGNOSIS — G63 Polyneuropathy in diseases classified elsewhere: Secondary | ICD-10-CM

## 2019-11-26 DIAGNOSIS — Z7952 Long term (current) use of systemic steroids: Secondary | ICD-10-CM | POA: Insufficient documentation

## 2019-11-26 DIAGNOSIS — I10 Essential (primary) hypertension: Secondary | ICD-10-CM | POA: Insufficient documentation

## 2019-11-26 DIAGNOSIS — Z7189 Other specified counseling: Secondary | ICD-10-CM

## 2019-11-26 LAB — CMP (CANCER CENTER ONLY)
ALT: 16 U/L (ref 0–44)
AST: 14 U/L — ABNORMAL LOW (ref 15–41)
Albumin: 3.8 g/dL (ref 3.5–5.0)
Alkaline Phosphatase: 108 U/L (ref 38–126)
Anion gap: 9 (ref 5–15)
BUN: 14 mg/dL (ref 8–23)
CO2: 31 mmol/L (ref 22–32)
Calcium: 9.3 mg/dL (ref 8.9–10.3)
Chloride: 101 mmol/L (ref 98–111)
Creatinine: 0.81 mg/dL (ref 0.44–1.00)
GFR, Est AFR Am: >60 mL/min (ref >60)
GFR, Estimated: >60 mL/min (ref >60)
Glucose, Bld: 125 mg/dL — ABNORMAL HIGH (ref 70–99)
Potassium: 3.2 mmol/L — ABNORMAL LOW (ref 3.5–5.1)
Sodium: 141 mmol/L (ref 135–145)
Total Bilirubin: 0.3 mg/dL (ref 0.3–1.2)
Total Protein: 7.3 g/dL (ref 6.5–8.1)

## 2019-11-26 LAB — CBC WITH DIFFERENTIAL (CANCER CENTER ONLY)
Abs Immature Granulocytes: 0.01 10*3/uL (ref 0.00–0.07)
Basophils Absolute: 0 10*3/uL (ref 0.0–0.1)
Basophils Relative: 0 %
Eosinophils Absolute: 0.1 10*3/uL (ref 0.0–0.5)
Eosinophils Relative: 2 %
HCT: 39.1 % (ref 36.0–46.0)
Hemoglobin: 12.8 g/dL (ref 12.0–15.0)
Immature Granulocytes: 0 %
Lymphocytes Relative: 30 %
Lymphs Abs: 1.5 10*3/uL (ref 0.7–4.0)
MCH: 30.5 pg (ref 26.0–34.0)
MCHC: 32.7 g/dL (ref 30.0–36.0)
MCV: 93.3 fL (ref 80.0–100.0)
Monocytes Absolute: 0.5 10*3/uL (ref 0.1–1.0)
Monocytes Relative: 10 %
Neutro Abs: 2.8 10*3/uL (ref 1.7–7.7)
Neutrophils Relative %: 58 %
Platelet Count: 200 10*3/uL (ref 150–400)
RBC: 4.19 MIL/uL (ref 3.87–5.11)
RDW: 14.9 % (ref 11.5–15.5)
WBC Count: 4.9 10*3/uL (ref 4.0–10.5)
nRBC: 0 % (ref 0.0–0.2)

## 2019-11-26 MED ORDER — LIDOCAINE-PRILOCAINE 2.5-2.5 % EX CREA: TOPICAL_CREAM | CUTANEOUS | 3 refills | Status: AC

## 2019-11-26 MED ORDER — DEXAMETHASONE 4 MG PO TABS: ORAL_TABLET | ORAL | 6 refills | Status: DC

## 2019-11-26 MED ORDER — SODIUM CHLORIDE 0.9% FLUSH
10.0000 mL | Freq: Once | INTRAVENOUS | Status: AC
  Administered 2019-11-26: 10 mL
  Filled 2019-11-26: qty 10

## 2019-11-26 NOTE — Assessment & Plan Note (Signed)
I plan mild upfront dose adjustment by adjusting her BSA closer to 2 due to pre-existing peripheral neuropathy She will continue taking gabapentin

## 2019-11-26 NOTE — Assessment & Plan Note (Signed)
She is advised on dietary modification due to anticipated steroid-induced hyperglycemia with treatment

## 2019-11-26 NOTE — Assessment & Plan Note (Signed)
Unfortunately, she has significant signs of disease progression although she is not symptomatic We will discontinue Herceptin today We discussed the current NCCN guidelines and treatment option  We discussed options including treatment with combination carboplatin/paclitaxel versus pembrolizumab/lenvatinib  Ultimately, she elected to proceed with carboplatin/paclitaxel  We reviewed the NCCN guidelines We discussed the role of chemotherapy. The intent is of palliative intent.  We discussed some of the risks, benefits, side-effects of carboplatin & Taxol. Treatment is intravenous, every 3 weeks x 4-6 cycles  Some of the short term side-effects included, though not limited to, including weight loss, life threatening infections, risk of allergic reactions, need for transfusions of blood products, nausea, vomiting, change in bowel habits, loss of hair, admission to hospital for various reasons, and risks of death.   Long term side-effects are also discussed including risks of infertility, permanent damage to nerve function, hearing loss, chronic fatigue, kidney damage with possibility needing hemodialysis, and rare secondary malignancy including bone marrow disorders.  The patient is aware that the response rates discussed earlier is not guaranteed.  After a long discussion, patient made an informed decision to proceed with the prescribed plan of care.   Patient education material was dispensed. We discussed premedication with dexamethasone before chemotherapy. Due to pre-existing peripheral neuropathy, I plan to prescribe upfront dose adjustment of Taxol She will receive carboplatin AUC of 5 I do not plan prophylactic G-CSF support I plan to repeat imaging study after 3 cycles of treatment.  If she had positive response to treatment, I will consider adding bevacizumab The patient elected to start her treatment on December 11, 2019  I will see her prior to cycle 2 of treatment

## 2019-11-26 NOTE — Progress Notes (Signed)
ON PATHWAY REGIMEN - Uterine  No Change  Continue With Treatment as Ordered.     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

## 2019-11-26 NOTE — Progress Notes (Signed)
DISCONTINUE ON PATHWAY REGIMEN - Uterine     A cycle is every 21 days:     Paclitaxel      Carboplatin   **Always confirm dose/schedule in your pharmacy ordering system**  REASON: Other Reason PRIOR TREATMENT: UTOS50: Carboplatin + Paclitaxel (6/175) q21 Days x 6 Cycles TREATMENT RESPONSE: Progressive Disease (PD)  START ON PATHWAY REGIMEN - Uterine     Cycle 1: A cycle is 21 days:     Trastuzumab-xxxx      Paclitaxel      Carboplatin    Cycles 2 through 6: A cycle is every 21 days:     Trastuzumab-xxxx      Paclitaxel      Carboplatin    Cycles 7 and beyond: A cycle is every 21 days:     Trastuzumab-xxxx   **Always confirm dose/schedule in your pharmacy ordering system**  Patient Characteristics: Serous Carcinoma, Recurrent/Progressive Disease, Second Line, HER2 Positive Histology: Serous Carcinoma Therapeutic Status: Recurrent or Progressive Disease HER2 Status: Positive Line of Therapy: Second Line Intent of Therapy: Non-Curative / Palliative Intent, Discussed with Patient

## 2019-11-26 NOTE — Assessment & Plan Note (Signed)
She understood goals of care is palliative in nature We discussed the importance of advanced directives and living will

## 2019-11-26 NOTE — Patient Instructions (Signed)

## 2019-11-26 NOTE — Progress Notes (Signed)
OFF PATHWAY REGIMEN - Uterine  No Change  Continue With Treatment as Ordered.   [id number] and hyaluronidase-oysk 600 mg/10,000 units SUBQ D1 q21 Days:   A cycle is every 21 days:     Trastuzumab and hyaluronidase-oysk   **Always confirm dose/schedule in your pharmacy ordering system**  Patient Characteristics: Papillary Serous and Clear Cell Histology, Newly Diagnosed, Resected Histology: Papillary Serous and Clear Cell Histology Therapeutic Status: Newly Diagnosed AJCC T Category: T1 AJCC N Category: N1 AJCC M Category: M0 AJCC 8 Stage Grouping: IIIC1 Surgical Status: Resected Intent of Therapy: Curative Intent, Discussed with Patient

## 2019-11-26 NOTE — Progress Notes (Signed)
[place] OFFICE PROGRESS NOTE  Patient Care Team: Ronita Hipps, MD as PCP - General (Family Medicine) Nicholaus Bloom, MD (Anesthesiology)  ASSESSMENT & PLAN:  Endometrial cancer [facility]) Unfortunately, she has significant signs of disease progression although she is not symptomatic We will discontinue Herceptin today We discussed the current NCCN guidelines and treatment option  We discussed options including treatment with combination carboplatin/paclitaxel versus pembrolizumab/lenvatinib  Ultimately, she elected to proceed with carboplatin/paclitaxel  We reviewed the NCCN guidelines We discussed the role of chemotherapy. The intent is of palliative intent.  We discussed some of the risks, benefits, side-effects of carboplatin & Taxol. Treatment is intravenous, every 3 weeks x 4-6 cycles  Some of the short term side-effects included, though not limited to, including weight loss, life threatening infections, risk of allergic reactions, need for transfusions of blood products, nausea, vomiting, change in bowel habits, loss of hair, admission to hospital for various reasons, and risks of death.   Long term side-effects are also discussed including risks of infertility, permanent damage to nerve function, hearing loss, chronic fatigue, kidney damage with possibility needing hemodialysis, and rare secondary malignancy including bone marrow disorders.  The patient is aware that the response rates discussed earlier is not guaranteed.  After a long discussion, patient made an informed decision to proceed with the prescribed plan of care.   Patient education material was dispensed. We discussed premedication with dexamethasone before chemotherapy. Due to pre-existing peripheral neuropathy, I plan to prescribe upfront dose adjustment of Taxol She will receive carboplatin AUC of 5 I do not plan prophylactic G-CSF support I plan to repeat imaging study after 3 cycles of  treatment.  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 CBC with Differential (Cancer Center Only)    Standing Status:   Standing    Number of Occurrences:   20    Standing Expiration Date:   11/25/2020  . CMP ([place] only)    Standing Status:   Standing    Number of Occurrences:   20    Standing Expiration Date:   11/25/2020    All questions were answered. The patient knows to call the clinic with any problems, questions or concerns. The total time spent in the appointment was 40 minutes encounter with patients including review of chart and various tests results, discussions about plan of care and coordination of care plan   Heath Lark, MD 11/26/2019 1:16 PM  INTERVAL HISTORY: Please see below for problem oriented charting. 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)  07/15/2018  Initial Diagnosis   She noted postmenopausal bleeding for the first time in October 2019. A Pap was done  09/02/18 showign ASC-H. She was referred onto GYN and seen by Dr. Paula Compton    09/09/2018 Procedure   She underwent endometrial biopsy   09/12/2018 Pathology Results   Endometrial biopsy positive for adenocarcinoma   09/22/2018 Imaging   Ct abdomen and pelvis showed: Diffuse endometrial thickening, consistent with primary endometrial carcinoma.  Pelvic and abdominal retroperitoneal and retrocrural lymphadenopathy, consistent with metastatic disease.  Moderate hepatic steatosis.   09/25/2018 Cancer Staging   Staging form: Corpus Uteri - Carcinoma and Carcinosarcoma, AJCC 8th Edition - Pathologic: Stage IIIC2 (pT2, pN2, cM0) - Signed by Heath Lark, MD on 02/23/2019   10/01/2018 Procedure   Successful placement of a right IJ approach Power Port with ultrasound and fluoroscopic guidance. The catheter is ready for use.   10/02/2018 Tumor Marker   Patient's tumor was tested for the following markers: CA-125 Results of the tumor marker test revealed 578   10/03/2018 - 01/16/2019 Chemotherapy   The patient had carboplatin and Taxol with dose adjustment due to neuropathy x 6 cycles   10/24/2018 Tumor Marker   Patient's tumor was tested for the following markers: CA-125 Results of the tumor marker test revealed 296    Genetic Testing   Patient has genetic testing done for MSI/MMR. Results revealed MSI stable and MMR normal on Accession [id number].   11/14/2018 Tumor Marker   Patient's tumor was tested for the following markers: CA-125 Results of the tumor marker test revealed 81   11/27/2018 Imaging   1. Significant decrease in abdominal retroperitoneal and bilateral iliac lymphadenopathy since previous study. 2. Significant decrease in abnormal endometrial soft tissue density since prior study. 3. No new or progressive metastatic disease identified.   12/05/2018 Tumor Marker   Patient's tumor was tested for the following markers: CA-125 Results of the tumor marker test  revealed 46.3   12/26/2018 Tumor Marker   Patient's tumor was tested for the following markers: CA-125 Results of the tumor marker test revealed 38.2   01/16/2019 Tumor Marker   Patient's tumor was tested for the following markers: CA-125 Results of the tumor marker test revealed 30.8   02/03/2019 Imaging   1. Numerous retroperitoneal, iliac, and pelvic lymph nodes are stable or slightly decreased in size compared to immediate prior examination dated 11/27/2018 and significantly decreased in size compared to exam dated 09/20/2018.  2.  Unchanged thickening of the endometrium.  3. Other chronic, incidental, and postoperative findings as detailed above.   02/12/2019 Imaging   1. Uterus +/- tubes/ovaries, neoplastic, cervix, bilateral fallopian tubes and ovaries - MIXED HIGH-GRADE SEROUS AND ENDOMETRIAL ADENOCARCINOMA, 6.5 CM. SEE NOTE - CARCINOMA INVOLVES ENTIRE ENDOMETRIUM, ANTERIOR AND POSTERIOR LOWER UTERINE SEGMENTS AND ANTERIOR AND POSTERIOR CERVIX. - CARCINOMA INVADES FOR A DEPTH OF 1.9 CM WHERE MYOMETRIAL THICKNESS IS 2.0 CM (GREATER THAN 50% MYOMETRIAL INVASION) - CARCINOMA IS 1 MM FROM THE CERVICAL MARGIN - NEGATIVE FOR LYMPHOVASCULAR OR PERINEURAL INVASION - BILATERAL UNREMARKABLE FALLOPIAN TUBES AND OVARIES, NEGATIVE FOR CARCINOMA - SEE ONCOLOGY TABLE 2. Lymph node, biopsy, right pelvic - METASTATIC CARCINOMA TO ONE OF TWO LYMPH NODES (1/2) Microscopic Comment 1. (v4.1.0.0) UTERUS, CARCINOMA OR CARCINOSARCOMA Procedure: Total hysterectomy with bilateral salpingo-oophorectomy Histologic type: Mixed high-grade serous and endometrioid adenocarcinoma Histologic Grade: NA Myometrial invasion: Present Depth of invasion: 19 mm Myometrial thickness: 20 mm Uterine Serosa Involvement: Not identified Cervical stromal involvement: Present Extent of involvement of other  organs: NA Lymphovascular invasion: Not identified Regional Lymph Nodes: Examined: 0 Sentinel 2 Non-sentinel 2  Total Lymph nodes with metastasis: 1 Isolated tumor cells (< 0.2 mm): 0 Micrometastasis: (> 0.2 mm and < 2.0 mm): 1 Macrometastasis: (> 2.0 mm): 0 Extracapsular extension: Not identified Tumor block for ancillary studies: 83F MMR / MSI testing: Pending Pathologic Stage Classification (pTNM, AJCC 8th edition): pT2, pN23m FIGO Stage: IIIC1 Representative Tumor Block: 1A, 1C (v4.1.0.0) Diagnosis Note 1. Immunohistochemical stains for p16, p53, ER and Ki-67 show a pattern of staining consistent with mixed high-grade serous and endometrial adenocarcinoma. Dr. KLyndon Codehas reviewed this case and concurs with the above interpretation. A molecular study for microsatellite instability (MSI) and immunostains for MMR-related proteins are pending and will be reported in an addendum.   02/12/2019 Surgery   Pre-operative Diagnosis: endometrial cancer stage IIIC2, s/p neoadjuvant chemotherapy, extreme obesity (BMI 53kg/m2)  Operation: Robotic-assisted laparoscopic total hysterectomy with bilateral salpingoophorectomy, right pelvic lymphadenectomy. 22 modifier for extreme obesity  Surgeon: RDonaciano Eva Assistant Surgeon: LLahoma CrockerMD  Operative Findings:  : extreme obesity, BMI 53kg/m2, with significant retroperitoneal and intraperitoneal adiposity. Obesity necessitated an additional hour of operating time to create safe exposure. Obesity required additional personnel in the operating room for positioning and retraction. Severe obesity substantially increased the complexity of the procedure. 8cm uterus, grossly normal, normal appearing cervix. Retroperitoneal fibrosis consistent with nodal positivity. Clinically suspicious right pelvic lymph node. Unable to completely visualize retroperitoneal nodes due to extreme obesity.    02/12/2019 Pathology Results   IMMUNOHISTOCHEMICAL AND MORPHOMETRIC ANALYSIS PERFORMED MANUALLY The tumor cells are POSITIVE for Her2 (3+). Estrogen Receptor: 70%,  POSITIVE, STRONG STAINING INTENSITY Progesterone Receptor: 70%, POSITIVE, STRONG STAINING INTENSITY Proliferation Marker Ki67: 20% REFERENCE RANGE ESTROGEN RECEPTOR NEGATIVE 0% POSITIVE =>1% REFERENCE RANGE PROGESTERONE RECEPTOR NEGATIVE 0% POSITIVE =>1% All controls stained appropriately  1. Uterus +/- tubes/ovaries, neoplastic, cervix, bilateral fallopian tubes and ovaries - MIXED HIGH-GRADE SEROUS AND ENDOMETRIAL ADENOCARCINOMA, 6.5 CM. SEE NOTE - CARCINOMA INVOLVES ENTIRE ENDOMETRIUM, ANTERIOR AND POSTERIOR LOWER UTERINE SEGMENTS AND ANTERIOR AND POSTERIOR CERVIX. - CARCINOMA INVADES FOR A DEPTH OF 1.9 CM WHERE MYOMETRIAL THICKNESS IS 2.0 CM (GREATER THAN 50% MYOMETRIAL INVASION) - CARCINOMA IS 1 MM FROM THE CERVICAL MARGIN - NEGATIVE FOR LYMPHOVASCULAR OR PERINEURAL INVASION - BILATERAL UNREMARKABLE FALLOPIAN TUBES AND OVARIES, NEGATIVE FOR CARCINOMA - SEE ONCOLOGY TABLE 2. Lymph node, biopsy, right pelvic - METASTATIC CARCINOMA TO ONE OF TWO LYMPH NODES (1/2) Microscopic Comment 1. (v4.1.0.0) UTERUS, CARCINOMA OR CARCINOSARCOMA Procedure: Total hysterectomy with bilateral salpingo-oophorectomy Histologic type: Mixed high-grade serous and endometrioid adenocarcinoma Histologic Grade: NA Myometrial invasion: Present Depth of invasion: 19 mm Myometrial thickness: 20 mm Uterine Serosa Involvement: Not identified Cervical stromal involvement: Present Extent of involvement of other organs: NA Lymphovascular invasion: Not identified Regional Lymph Nodes: Examined: 0 Sentinel 2 Non-sentinel 2 Total Lymph nodes with metastasis: 1 Isolated tumor cells (< 0.2 mm): 0 Micrometastasis: (> 0.2 mm and < 2.0 mm): 1 Macrometastasis: (> 2.0 mm): 0 Extracapsular extension: Not identified Tumor block for ancillary studies: 82F MMR / MSI testing: Pending Pathologic Stage Classification (pTNM, AJCC 8th edition): pT2, pN152mFIGO Stage: IIIC1 Representative Tumor Block: 1A, 1C   03/17/2019  Echocardiogram   1. The left ventricle has normal systolic function, with an ejection fraction of 55-60%. The cavity size was normal. There is moderate concentric left ventricular hypertrophy. Left ventricular diastolic Doppler parameters are consistent with impaired relaxation.  2. The right ventricle has normal systolic  function. The cavity was normal. There is no increase in right ventricular wall thickness.  3. GLS: -15.5%, no prior study available for comparison.   03/17/2019 Tumor Marker   Patient's tumor was tested for the following markers: CA-125 Results of the tumor marker test revealed 43   03/20/2019 -  Chemotherapy   The patient had trastuzumab for chemotherapy treatment.     04/30/2019 Tumor Marker   Patient's tumor was tested for the following markers:CA-125 Results of the tumor marker test revealed 29   05/20/2019 Imaging   1. No evidence of metastatic disease. 2. Small pelvic and left paracolic gutter ascites, new. 3. Aortic atherosclerosis (ICD10-170.0). Coronary artery calcification.     05/26/2019 Echocardiogram      1. The left ventricle has normal systolic function with an ejection fraction of 60-65%. The cavity size was normal. There is mildly increased left ventricular wall thickness. Left ventricular diastolic parameters were normal.  2. GLS -16.4% (underestimated due to poor endocardial tracking).  3. The right ventricle has normal systolic function. The cavity was normal. There is no increase in right ventricular wall thickness.  4. Mild calcification of the mitral valve leaflet.  5. Mild calcification of the aortic valve. No stenosis of the aortic valve.  6. The aorta is normal in size and structure.   09/02/2019 Imaging   No evidence of recurrent or metastatic carcinoma within the abdomen or pelvis.   Colonic diverticulosis. No radiographic evidence of diverticulitis.   Increased large stool burden noted; recommend clinical correlation for possible  constipation.   09/02/2019 Echocardiogram   1. Left ventricular ejection fraction, by visual estimation, is 60 to 65%. The left ventricle has normal function. There is no left ventricular hypertrophy.  2. Global right ventricle has normal systolic function.The right ventricular size is normal. No increase in right ventricular wall thickness.  3. Left atrial size was normal.  4. Right atrial size was normal.  5. The mitral valve is normal in structure. No evidence of mitral valve regurgitation.  6. The tricuspid valve is normal in structure. Tricuspid valve regurgitation is trivial.  7. The aortic valve is normal in structure. Aortic valve regurgitation is not visualized.  8. The pulmonic valve was normal in structure. Pulmonic valve regurgitation is trivial.  9. Mildly elevated pulmonary artery systolic pressure. 10. The average left ventricular global longitudinal strain is -26.9 %.   09/03/2019 Tumor Marker   Patient's tumor was tested for the following markers: CA-125 Results of the tumor marker test revealed 13.5   11/05/2019 Tumor Marker   Patient's tumor was tested for the following markers: CA-125 Results of the tumor marker test revealed 27.   11/25/2019 Imaging   1. New omental nodules suspicious for early peritoneal spread of malignancy. 2. New thickening along the right paracolic gutter concerning for potential tumor. 3. Other imaging findings of potential clinical significance: Mild cardiomegaly. Stable lateral segment left hepatic lobe hypodense lesions, likely cysts. Multilevel posterior decompression with multilevel foraminal impingement. 4. 4 mm left lower lobe pulmonary nodule not readily appreciable on the prior exam, could be inflammatory or neoplastic. Surveillance suggested. 5. Other imaging findings of potential clinical significance: Mild cardiomegaly. Suspected hepatic cysts. Multilevel lumbar impingement.   Aortic Atherosclerosis (ICD10-I70.0).   12/11/2019 -   Chemotherapy   The patient had palonosetron (ALOXI) injection 0.25 mg, 0.25 mg, Intravenous,  Once, 0 of 6 cycles CARBOplatin (PARAPLATIN) 700 mg in sodium chloride 0.9 % 250 mL chemo infusion, 700 mg, Intravenous,  Once, 0  of 6 cycles fosaprepitant (EMEND) 150 mg in sodium chloride 0.9 % 145 mL IVPB, 150 mg, Intravenous,  Once, 0 of 6 cycles PACLitaxel (TAXOL) 264 mg in sodium chloride 0.9 % 250 mL chemo infusion (> 66m/m2), 135 mg/m2 = 264 mg, Intravenous,  Once, 0 of 6 cycles  for chemotherapy treatment.      REVIEW OF SYSTEMS:   Constitutional: Denies fevers, chills or abnormal weight loss Eyes: Denies blurriness of vision Ears, nose, mouth, throat, and face: Denies mucositis or sore throat Respiratory: Denies cough, dyspnea or wheezes Cardiovascular: Denies palpitation, chest discomfort or lower extremity swelling Gastrointestinal:  Denies nausea, heartburn or change in bowel habits Skin: Denies abnormal skin rashes Lymphatics: Denies new lymphadenopathy or easy bruising Behavioral/Psych: Mood is stable, no new changes  All other systems were reviewed with the patient and are negative.  I have reviewed the past medical history, past surgical history, social history and family history with the patient and they are unchanged from previous note.  ALLERGIES:  is allergic to tegaderm ag mesh [silver].  MEDICATIONS:  Current Outpatient Medications  Medication Sig Dispense Refill  . dexamethasone (DECADRON) 4 MG tablet Take 2 tabs at the night before and 2 tabs the morning of chemotherapy, every 3 weeks, by mouth x 6 cycles 36 tablet 6  . diphenhydrAMINE (BENADRYL) 25 mg capsule Take 100 mg by mouth 2 (two) times a day.    . furosemide (LASIX) 20 MG tablet Take 1 tablet (20 mg total) by mouth daily. 30 tablet 1  . gabapentin (NEURONTIN) 400 MG capsule Take 3 capsules (1,200 mg total) by mouth 3 (three) times daily. 270 capsule 11  . lidocaine-prilocaine (EMLA) cream Apply to affected  area once 30 g 3  . losartan-hydrochlorothiazide (HYZAAR) 50-12.5 MG tablet Take 2 tablets by mouth daily.    . ondansetron (ZOFRAN) 8 MG tablet Take 8 mg by mouth every 8 (eight) hours as needed.    .Marland KitchenoxyCODONE-acetaminophen (PERCOCET) 10-325 MG tablet Take 1-2 tablets by mouth every 6 (six) hours as needed for pain.    .Marland Kitchenprochlorperazine (COMPAZINE) 10 MG tablet TAKE 1 TABLET BY MOUTH EVERY 6 HOURS AS NEEDED FOR NAUSEA OR VOMITING 30 tablet 1   No current facility-administered medications for this visit.    PHYSICAL EXAMINATION: ECOG PERFORMANCE STATUS: 1 - Symptomatic but completely ambulatory  Vitals:   11/26/19 0942  BP: (!) 157/79  Pulse: 76  Resp: 17  Temp: (!) 97.3 F (36.3 C)  SpO2: 97%   Filed Weights   11/26/19 0942  Weight: 292 lb 3.2 oz (132.5 kg)    GENERAL:alert, no distress and comfortable Musculoskeletal:no cyanosis of digits and no clubbing  NEURO: alert & oriented x 3 with fluent speech, no focal motor/sensory deficits  LABORATORY DATA:  I have reviewed the data as listed    Component Value Date/Time   NA 141 11/26/2019 0930   K 3.2 (L) 11/26/2019 0930   CL 101 11/26/2019 0930   CO2 31 11/26/2019 0930   GLUCOSE 125 (H) 11/26/2019 0930   BUN 14 11/26/2019 0930   CREATININE 0.81 11/26/2019 0930   CALCIUM 9.3 11/26/2019 0930   PROT 7.3 11/26/2019 0930   ALBUMIN 3.8 11/26/2019 0930   AST 14 (L) 11/26/2019 0930   ALT 16 11/26/2019 0930   ALKPHOS 108 11/26/2019 0930   BILITOT 0.3 11/26/2019 0930   GFRNONAA >60 11/26/2019 0930   GFRAA >60 11/26/2019 0930    No results found for: SPEP, UPEP  Lab  Results  Component Value Date   WBC 4.9 11/26/2019   NEUTROABS 2.8 11/26/2019   HGB 12.8 11/26/2019   HCT 39.1 11/26/2019   MCV 93.3 11/26/2019   PLT 200 11/26/2019      Chemistry      Component Value Date/Time   NA 141 11/26/2019 0930   K 3.2 (L) 11/26/2019 0930   CL 101 11/26/2019 0930   CO2 31 11/26/2019 0930   BUN 14 11/26/2019 0930    CREATININE 0.81 11/26/2019 0930      Component Value Date/Time   CALCIUM 9.3 11/26/2019 0930   ALKPHOS 108 11/26/2019 0930   AST 14 (L) 11/26/2019 0930   ALT 16 11/26/2019 0930   BILITOT 0.3 11/26/2019 0930       RADIOGRAPHIC STUDIES: I reviewed multiple imaging studies with the patient I have personally reviewed the radiological images as listed and agreed with the findings in the report. CT ABDOMEN PELVIS W CONTRAST  Result Date: 11/25/2019 CLINICAL DATA:  Restaging of uterine cancer. Elevated tumor markers. EXAM: CT ABDOMEN AND PELVIS WITH CONTRAST TECHNIQUE: Multidetector CT imaging of the abdomen and pelvis was performed using the standard protocol following bolus administration of intravenous contrast. CONTRAST:  163m OMNIPAQUE IOHEXOL 300 MG/ML  SOLN COMPARISON:  09/02/2019 FINDINGS: Lower chest: Mild cardiomegaly. 0.5 by 0.3 cm slightly ill-defined peripheral nodule in the left lower lobe on image 14/6, not readily appreciable on the prior CT. Hepatobiliary: Stable lateral segment left hepatic lobe hypodense lesions are homogeneous and favor cysts. Continued mild intrahepatic biliary dilatation, common bile duct 0.9 cm in diameter, likewise stable. Cholecystectomy noted. Pancreas: Unremarkable Spleen: Unremarkable Adrenals/Urinary Tract: Unremarkable Stomach/Bowel: Unremarkable Vascular/Lymphatic: Aortoiliac atherosclerotic vascular disease. Aortocaval lymph node 0.8 cm in short axis on image 36/2, formerly 0.6 cm. Left common iliac node 0.7 cm in short axis, previously 0.6 cm. A left external iliac lymph node measures 0.8 cm in short axis on image 62/2, previously 0.9 cm. Other: Although somewhat ill-defined along their margins, there are new omental nodules suspicious for early peritoneal spread of malignancy. A right omental nodule measures 1.0 by 0.7 cm on image 55/2. A left omental nodule measures 0.6 cm in diameter on image 53/2. A separate left omental nodule measures 0.7 by 0.6 cm on  image 58/2. Other smaller and more subtle omental nodules are also present. There is new thickening along the right paracolic gutter measuring 0.9 by 2.0 cm on image 49/2 concerning for potential tumor. Musculoskeletal: Multilevel posterior decompression. Spurring causes right foraminal impingement at L1-2, L2-3, and L3-4; and left foraminal impingement at L2-3, L3-4, and L5-S1. IMPRESSION: 1. New omental nodules suspicious for early peritoneal spread of malignancy. 2. New thickening along the right paracolic gutter concerning for potential tumor. 3. Other imaging findings of potential clinical significance: Mild cardiomegaly. Stable lateral segment left hepatic lobe hypodense lesions, likely cysts. Multilevel posterior decompression with multilevel foraminal impingement. 4. 4 mm left lower lobe pulmonary nodule not readily appreciable on the prior exam, could be inflammatory or neoplastic. Surveillance suggested. 5. Other imaging findings of potential clinical significance: Mild cardiomegaly. Suspected hepatic cysts. Multilevel lumbar impingement. Aortic Atherosclerosis (ICD10-I70.0). Electronically Signed   By: WVan ClinesM.D.   On: 11/25/2019 10:30

## 2019-11-26 NOTE — Telephone Encounter (Signed)
Kathryn [name] called and asked if she could start the new chemotherapy on 12/11/19.  She is worried about starting on 12/02/19 and having her second Covid shot on 12/04/19.  She said she usually does not feel good about the 2nd/3rd day after chemo and is worried about the side effects from the vaccine on top of the chemo side effects.  She said Friday's are the best for her and her son who brings her to treatment.  She also is ok with having her labs redone if she needs to.

## 2019-11-26 NOTE — Telephone Encounter (Signed)
Called Kathryn [name] back and let her know that the schedulers will call her to set up appointments for 12/11/19.

## 2019-11-26 NOTE — Telephone Encounter (Signed)
OK, I deleted the earlier scheduling msg I will get her started on 2/26; she will have her labs rechecked

## 2019-11-27 LAB — CA 125: Cancer Antigen (CA) 125: 129 U/mL — ABNORMAL HIGH (ref 0.0–38.1)

## 2019-11-30 ENCOUNTER — Telehealth: Payer: Self-pay | Admitting: Hematology and Oncology

## 2019-11-30 NOTE — Telephone Encounter (Signed)
Scheduled per 2/11 sch msg. Called and spoke with pt, confirmed 2/26 and 3/19 appts

## 2019-12-01 ENCOUNTER — Other Ambulatory Visit: Payer: Self-pay | Admitting: Hematology and Oncology

## 2019-12-11 ENCOUNTER — Inpatient Hospital Stay: Payer: Medicare Other

## 2019-12-11 ENCOUNTER — Other Ambulatory Visit: Payer: Self-pay

## 2019-12-11 VITALS — BP 163/74 | HR 91 | Temp 98.2°F | Resp 18

## 2019-12-11 DIAGNOSIS — Z7189 Other specified counseling: Secondary | ICD-10-CM

## 2019-12-11 DIAGNOSIS — Z9221 Personal history of antineoplastic chemotherapy: Secondary | ICD-10-CM | POA: Diagnosis not present

## 2019-12-11 DIAGNOSIS — C541 Malignant neoplasm of endometrium: Secondary | ICD-10-CM | POA: Diagnosis not present

## 2019-12-11 DIAGNOSIS — G629 Polyneuropathy, unspecified: Secondary | ICD-10-CM | POA: Diagnosis not present

## 2019-12-11 DIAGNOSIS — R911 Solitary pulmonary nodule: Secondary | ICD-10-CM | POA: Diagnosis not present

## 2019-12-11 DIAGNOSIS — Z79899 Other long term (current) drug therapy: Secondary | ICD-10-CM | POA: Diagnosis not present

## 2019-12-11 DIAGNOSIS — I10 Essential (primary) hypertension: Secondary | ICD-10-CM | POA: Diagnosis not present

## 2019-12-11 DIAGNOSIS — Z7952 Long term (current) use of systemic steroids: Secondary | ICD-10-CM | POA: Diagnosis not present

## 2019-12-11 DIAGNOSIS — T380X5A Adverse effect of glucocorticoids and synthetic analogues, initial encounter: Secondary | ICD-10-CM | POA: Diagnosis not present

## 2019-12-11 DIAGNOSIS — R739 Hyperglycemia, unspecified: Secondary | ICD-10-CM | POA: Diagnosis not present

## 2019-12-11 LAB — CBC WITH DIFFERENTIAL (CANCER CENTER ONLY)
Abs Immature Granulocytes: 0.04 10*3/uL (ref 0.00–0.07)
Basophils Absolute: 0 10*3/uL (ref 0.0–0.1)
Basophils Relative: 0 %
Eosinophils Absolute: 0 10*3/uL (ref 0.0–0.5)
Eosinophils Relative: 0 %
HCT: 40 % (ref 36.0–46.0)
Hemoglobin: 13 g/dL (ref 12.0–15.0)
Immature Granulocytes: 1 %
Lymphocytes Relative: 13 %
Lymphs Abs: 0.9 10*3/uL (ref 0.7–4.0)
MCH: 30.5 pg (ref 26.0–34.0)
MCHC: 32.5 g/dL (ref 30.0–36.0)
MCV: 93.9 fL (ref 80.0–100.0)
Monocytes Absolute: 0.2 10*3/uL (ref 0.1–1.0)
Monocytes Relative: 2 %
Neutro Abs: 6.1 10*3/uL (ref 1.7–7.7)
Neutrophils Relative %: 84 %
Platelet Count: 247 10*3/uL (ref 150–400)
RBC: 4.26 MIL/uL (ref 3.87–5.11)
RDW: 14.7 % (ref 11.5–15.5)
WBC Count: 7.2 10*3/uL (ref 4.0–10.5)
nRBC: 0 % (ref 0.0–0.2)

## 2019-12-11 LAB — CMP (CANCER CENTER ONLY)
ALT: 14 U/L (ref 0–44)
AST: 11 U/L — ABNORMAL LOW (ref 15–41)
Albumin: 3.9 g/dL (ref 3.5–5.0)
Alkaline Phosphatase: 115 U/L (ref 38–126)
Anion gap: 11 (ref 5–15)
BUN: 19 mg/dL (ref 8–23)
CO2: 27 mmol/L (ref 22–32)
Calcium: 9 mg/dL (ref 8.9–10.3)
Chloride: 100 mmol/L (ref 98–111)
Creatinine: 0.83 mg/dL (ref 0.44–1.00)
GFR, Est AFR Am: >60 mL/min (ref >60)
GFR, Estimated: >60 mL/min (ref >60)
Glucose, Bld: 188 mg/dL — ABNORMAL HIGH (ref 70–99)
Potassium: 3.2 mmol/L — ABNORMAL LOW (ref 3.5–5.1)
Sodium: 138 mmol/L (ref 135–145)
Total Bilirubin: 0.4 mg/dL (ref 0.3–1.2)
Total Protein: 7.9 g/dL (ref 6.5–8.1)

## 2019-12-11 MED ORDER — DIPHENHYDRAMINE HCL 50 MG/ML IJ SOLN
INTRAMUSCULAR | Status: AC
  Filled 2019-12-11: qty 1

## 2019-12-11 MED ORDER — FAMOTIDINE IN NACL 20-0.9 MG/50ML-% IV SOLN
20.0000 mg | Freq: Once | INTRAVENOUS | Status: AC
  Administered 2019-12-11: 20 mg via INTRAVENOUS

## 2019-12-11 MED ORDER — PALONOSETRON HCL INJECTION 0.25 MG/5ML
0.2500 mg | Freq: Once | INTRAVENOUS | Status: AC
  Administered 2019-12-11: 0.25 mg via INTRAVENOUS

## 2019-12-11 MED ORDER — PALONOSETRON HCL INJECTION 0.25 MG/5ML
INTRAVENOUS | Status: AC
  Filled 2019-12-11: qty 5

## 2019-12-11 MED ORDER — SODIUM CHLORIDE 0.9 % IV SOLN
696.0000 mg | Freq: Once | INTRAVENOUS | Status: AC
  Administered 2019-12-11: 700 mg via INTRAVENOUS
  Filled 2019-12-11: qty 70

## 2019-12-11 MED ORDER — DEXAMETHASONE SODIUM PHOSPHATE 10 MG/ML IJ SOLN
INTRAMUSCULAR | Status: AC
  Filled 2019-12-11: qty 1

## 2019-12-11 MED ORDER — HEPARIN SOD (PORK) LOCK FLUSH 100 UNIT/ML IV SOLN
500.0000 [IU] | Freq: Once | INTRAVENOUS | Status: AC | PRN
  Administered 2019-12-11: 500 [IU]
  Filled 2019-12-11: qty 5

## 2019-12-11 MED ORDER — SODIUM CHLORIDE 0.9 % IV SOLN
150.0000 mg | Freq: Once | INTRAVENOUS | Status: AC
  Administered 2019-12-11: 150 mg via INTRAVENOUS
  Filled 2019-12-11: qty 150

## 2019-12-11 MED ORDER — SODIUM CHLORIDE 0.9 % IV SOLN
Freq: Once | INTRAVENOUS | Status: AC
  Filled 2019-12-11: qty 250

## 2019-12-11 MED ORDER — DIPHENHYDRAMINE HCL 50 MG/ML IJ SOLN
50.0000 mg | Freq: Once | INTRAMUSCULAR | Status: AC
  Administered 2019-12-11: 50 mg via INTRAVENOUS

## 2019-12-11 MED ORDER — SODIUM CHLORIDE 0.9% FLUSH
10.0000 mL | Freq: Once | INTRAVENOUS | Status: AC
  Administered 2019-12-11: 10 mL
  Filled 2019-12-11: qty 10

## 2019-12-11 MED ORDER — DEXAMETHASONE SODIUM PHOSPHATE 10 MG/ML IJ SOLN
10.0000 mg | Freq: Once | INTRAMUSCULAR | Status: AC
  Administered 2019-12-11: 10 mg via INTRAVENOUS

## 2019-12-11 MED ORDER — SODIUM CHLORIDE 0.9% FLUSH
10.0000 mL | INTRAVENOUS | Status: DC | PRN
  Administered 2019-12-11: 10 mL
  Filled 2019-12-11: qty 10

## 2019-12-11 MED ORDER — SODIUM CHLORIDE 0.9 % IV SOLN: 10.0000 mg | Freq: Once | INTRAVENOUS | Status: DC

## 2019-12-11 MED ORDER — SODIUM CHLORIDE 0.9 % IV SOLN
135.0000 mg/m2 | Freq: Once | INTRAVENOUS | Status: AC
  Administered 2019-12-11: 264 mg via INTRAVENOUS
  Filled 2019-12-11: qty 44

## 2019-12-11 MED ORDER — FAMOTIDINE IN NACL 20-0.9 MG/50ML-% IV SOLN
INTRAVENOUS | Status: AC
  Filled 2019-12-11: qty 50

## 2019-12-17 ENCOUNTER — Other Ambulatory Visit: Payer: Medicare Other

## 2019-12-17 ENCOUNTER — Ambulatory Visit: Payer: Medicare Other

## 2019-12-17 ENCOUNTER — Ambulatory Visit: Payer: Medicare Other | Admitting: Hematology and Oncology

## 2019-12-18 DIAGNOSIS — Z79891 Long term (current) use of opiate analgesic: Secondary | ICD-10-CM | POA: Diagnosis not present

## 2019-12-18 DIAGNOSIS — G894 Chronic pain syndrome: Secondary | ICD-10-CM | POA: Diagnosis not present

## 2019-12-18 DIAGNOSIS — M961 Postlaminectomy syndrome, not elsewhere classified: Secondary | ICD-10-CM | POA: Diagnosis not present

## 2019-12-18 DIAGNOSIS — M5416 Radiculopathy, lumbar region: Secondary | ICD-10-CM | POA: Diagnosis not present

## 2019-12-24 ENCOUNTER — Other Ambulatory Visit: Payer: Self-pay | Admitting: Hematology and Oncology

## 2020-01-01 ENCOUNTER — Inpatient Hospital Stay: Payer: Medicare Other | Attending: Hematology and Oncology | Admitting: Hematology and Oncology

## 2020-01-01 ENCOUNTER — Inpatient Hospital Stay: Payer: Medicare Other

## 2020-01-01 ENCOUNTER — Other Ambulatory Visit: Payer: Self-pay

## 2020-01-01 ENCOUNTER — Telehealth: Payer: Self-pay | Admitting: Hematology and Oncology

## 2020-01-01 ENCOUNTER — Encounter: Payer: Self-pay | Admitting: Hematology and Oncology

## 2020-01-01 VITALS — BP 164/90 | HR 95 | Temp 98.0°F | Resp 18 | Ht 63.0 in | Wt 294.8 lb

## 2020-01-01 DIAGNOSIS — C541 Malignant neoplasm of endometrium: Secondary | ICD-10-CM | POA: Diagnosis not present

## 2020-01-01 DIAGNOSIS — R52 Pain, unspecified: Secondary | ICD-10-CM | POA: Diagnosis not present

## 2020-01-01 DIAGNOSIS — Z794 Long term (current) use of insulin: Secondary | ICD-10-CM | POA: Diagnosis not present

## 2020-01-01 DIAGNOSIS — T380X5A Adverse effect of glucocorticoids and synthetic analogues, initial encounter: Secondary | ICD-10-CM | POA: Diagnosis not present

## 2020-01-01 DIAGNOSIS — G893 Neoplasm related pain (acute) (chronic): Secondary | ICD-10-CM

## 2020-01-01 DIAGNOSIS — I10 Essential (primary) hypertension: Secondary | ICD-10-CM | POA: Diagnosis not present

## 2020-01-01 DIAGNOSIS — Z7189 Other specified counseling: Secondary | ICD-10-CM

## 2020-01-01 DIAGNOSIS — C775 Secondary and unspecified malignant neoplasm of intrapelvic lymph nodes: Secondary | ICD-10-CM

## 2020-01-01 DIAGNOSIS — G620 Drug-induced polyneuropathy: Secondary | ICD-10-CM | POA: Diagnosis not present

## 2020-01-01 DIAGNOSIS — Z5111 Encounter for antineoplastic chemotherapy: Secondary | ICD-10-CM | POA: Diagnosis not present

## 2020-01-01 DIAGNOSIS — Z7952 Long term (current) use of systemic steroids: Secondary | ICD-10-CM | POA: Diagnosis not present

## 2020-01-01 DIAGNOSIS — R739 Hyperglycemia, unspecified: Secondary | ICD-10-CM | POA: Insufficient documentation

## 2020-01-01 DIAGNOSIS — M5430 Sciatica, unspecified side: Secondary | ICD-10-CM

## 2020-01-01 DIAGNOSIS — G8929 Other chronic pain: Secondary | ICD-10-CM | POA: Diagnosis not present

## 2020-01-01 DIAGNOSIS — E669 Obesity, unspecified: Secondary | ICD-10-CM

## 2020-01-01 DIAGNOSIS — G629 Polyneuropathy, unspecified: Secondary | ICD-10-CM

## 2020-01-01 DIAGNOSIS — M549 Dorsalgia, unspecified: Secondary | ICD-10-CM | POA: Insufficient documentation

## 2020-01-01 DIAGNOSIS — Z79899 Other long term (current) drug therapy: Secondary | ICD-10-CM | POA: Insufficient documentation

## 2020-01-01 DIAGNOSIS — Z6841 Body Mass Index (BMI) 40.0 and over, adult: Secondary | ICD-10-CM

## 2020-01-01 DIAGNOSIS — T380X5D Adverse effect of glucocorticoids and synthetic analogues, subsequent encounter: Secondary | ICD-10-CM

## 2020-01-01 DIAGNOSIS — G63 Polyneuropathy in diseases classified elsewhere: Secondary | ICD-10-CM

## 2020-01-01 LAB — CMP (CANCER CENTER ONLY)
ALT: 17 U/L (ref 0–44)
AST: 13 U/L — ABNORMAL LOW (ref 15–41)
Albumin: 3.5 g/dL (ref 3.5–5.0)
Alkaline Phosphatase: 112 U/L (ref 38–126)
Anion gap: 13 (ref 5–15)
BUN: 17 mg/dL (ref 8–23)
CO2: 27 mmol/L (ref 22–32)
Calcium: 9 mg/dL (ref 8.9–10.3)
Chloride: 100 mmol/L (ref 98–111)
Creatinine: 0.8 mg/dL (ref 0.44–1.00)
GFR, Est AFR Am: >60 mL/min (ref >60)
GFR, Estimated: >60 mL/min (ref >60)
Glucose, Bld: 269 mg/dL — ABNORMAL HIGH (ref 70–99)
Potassium: 3.5 mmol/L (ref 3.5–5.1)
Sodium: 140 mmol/L (ref 135–145)
Total Bilirubin: 0.3 mg/dL (ref 0.3–1.2)
Total Protein: 7.1 g/dL (ref 6.5–8.1)

## 2020-01-01 LAB — CBC WITH DIFFERENTIAL (CANCER CENTER ONLY)
Abs Immature Granulocytes: 0.07 10*3/uL (ref 0.00–0.07)
Basophils Absolute: 0 10*3/uL (ref 0.0–0.1)
Basophils Relative: 0 %
Eosinophils Absolute: 0 10*3/uL (ref 0.0–0.5)
Eosinophils Relative: 0 %
HCT: 38 % (ref 36.0–46.0)
Hemoglobin: 12.5 g/dL (ref 12.0–15.0)
Immature Granulocytes: 1 %
Lymphocytes Relative: 12 %
Lymphs Abs: 1 10*3/uL (ref 0.7–4.0)
MCH: 31.2 pg (ref 26.0–34.0)
MCHC: 32.9 g/dL (ref 30.0–36.0)
MCV: 94.8 fL (ref 80.0–100.0)
Monocytes Absolute: 0.3 10*3/uL (ref 0.1–1.0)
Monocytes Relative: 4 %
Neutro Abs: 6.5 10*3/uL (ref 1.7–7.7)
Neutrophils Relative %: 83 %
Platelet Count: 172 10*3/uL (ref 150–400)
RBC: 4.01 MIL/uL (ref 3.87–5.11)
RDW: 16 % — ABNORMAL HIGH (ref 11.5–15.5)
WBC Count: 7.9 10*3/uL (ref 4.0–10.5)
nRBC: 0.3 % — ABNORMAL HIGH (ref 0.0–0.2)

## 2020-01-01 LAB — GLUCOSE, CAPILLARY: Glucose-Capillary: 218 mg/dL — ABNORMAL HIGH (ref 70–99)

## 2020-01-01 MED ORDER — SODIUM CHLORIDE 0.9 % IV SOLN
101.2500 mg/m2 | Freq: Once | INTRAVENOUS | Status: AC
  Administered 2020-01-01: 198 mg via INTRAVENOUS
  Filled 2020-01-01: qty 33

## 2020-01-01 MED ORDER — INSULIN REGULAR HUMAN 100 UNIT/ML IJ SOLN
10.0000 [IU] | Freq: Once | INTRAMUSCULAR | Status: AC
  Administered 2020-01-01: 10 [IU] via SUBCUTANEOUS

## 2020-01-01 MED ORDER — HEPARIN SOD (PORK) LOCK FLUSH 100 UNIT/ML IV SOLN
500.0000 [IU] | Freq: Once | INTRAVENOUS | Status: AC | PRN
  Administered 2020-01-01: 16:00:00 500 [IU]
  Filled 2020-01-01: qty 5

## 2020-01-01 MED ORDER — PALONOSETRON HCL INJECTION 0.25 MG/5ML
INTRAVENOUS | Status: AC
  Filled 2020-01-01: qty 5

## 2020-01-01 MED ORDER — DEXAMETHASONE SODIUM PHOSPHATE 10 MG/ML IJ SOLN
10.0000 mg | Freq: Once | INTRAMUSCULAR | Status: AC
  Administered 2020-01-01: 11:00:00 10 mg via INTRAVENOUS

## 2020-01-01 MED ORDER — SODIUM CHLORIDE 0.9 % IV SOLN
150.0000 mg | Freq: Once | INTRAVENOUS | Status: AC
  Administered 2020-01-01: 150 mg via INTRAVENOUS
  Filled 2020-01-01: qty 5

## 2020-01-01 MED ORDER — INSULIN REGULAR HUMAN 100 UNIT/ML IJ SOLN
INTRAMUSCULAR | Status: AC
  Filled 2020-01-01: qty 1

## 2020-01-01 MED ORDER — FAMOTIDINE IN NACL 20-0.9 MG/50ML-% IV SOLN
INTRAVENOUS | Status: AC
  Filled 2020-01-01: qty 50

## 2020-01-01 MED ORDER — DEXAMETHASONE SODIUM PHOSPHATE 10 MG/ML IJ SOLN
INTRAMUSCULAR | Status: AC
  Filled 2020-01-01: qty 1

## 2020-01-01 MED ORDER — DIPHENHYDRAMINE HCL 50 MG/ML IJ SOLN
25.0000 mg | Freq: Once | INTRAMUSCULAR | Status: AC
  Administered 2020-01-01: 25 mg via INTRAVENOUS

## 2020-01-01 MED ORDER — DIPHENHYDRAMINE HCL 50 MG/ML IJ SOLN
INTRAMUSCULAR | Status: AC
  Filled 2020-01-01: qty 1

## 2020-01-01 MED ORDER — SODIUM CHLORIDE 0.9 % IV SOLN
696.0000 mg | Freq: Once | INTRAVENOUS | Status: AC
  Administered 2020-01-01: 700 mg via INTRAVENOUS
  Filled 2020-01-01: qty 70

## 2020-01-01 MED ORDER — PALONOSETRON HCL INJECTION 0.25 MG/5ML
0.2500 mg | Freq: Once | INTRAVENOUS | Status: AC
  Administered 2020-01-01: 0.25 mg via INTRAVENOUS

## 2020-01-01 MED ORDER — SODIUM CHLORIDE 0.9% FLUSH
10.0000 mL | INTRAVENOUS | Status: DC | PRN
  Administered 2020-01-01: 10 mL
  Filled 2020-01-01: qty 10

## 2020-01-01 MED ORDER — SODIUM CHLORIDE 0.9 % IV SOLN
Freq: Once | INTRAVENOUS | Status: AC
  Filled 2020-01-01: qty 250

## 2020-01-01 MED ORDER — FAMOTIDINE IN NACL 20-0.9 MG/50ML-% IV SOLN
20.0000 mg | Freq: Once | INTRAVENOUS | Status: AC
  Administered 2020-01-01: 11:00:00 20 mg via INTRAVENOUS

## 2020-01-01 MED ORDER — METHADONE HCL 10 MG PO TABS: 20.0000 mg | ORAL_TABLET | Freq: Two times a day (BID) | ORAL | 0 refills | Status: DC

## 2020-01-01 NOTE — Patient Instructions (Signed)
[facility] Discharge Instructions for Patients Receiving Chemotherapy  Today you received the following chemotherapy agents: Taxol/Carbo  To help prevent nausea and vomiting after your treatment, we encourage you to take your nausea medication as directed.   If you develop nausea and vomiting that is not controlled by your nausea medication, call the clinic.   BELOW ARE SYMPTOMS THAT SHOULD BE REPORTED IMMEDIATELY:  *FEVER GREATER THAN 100.5 F  *CHILLS WITH OR WITHOUT FEVER  NAUSEA AND VOMITING THAT IS NOT CONTROLLED WITH YOUR NAUSEA MEDICATION  *UNUSUAL SHORTNESS OF BREATH  *UNUSUAL BRUISING OR BLEEDING  TENDERNESS IN MOUTH AND THROAT WITH OR WITHOUT PRESENCE OF ULCERS  *URINARY PROBLEMS  *BOWEL PROBLEMS  UNUSUAL RASH Items with * indicate a potential emergency and should be followed up as soon as possible.  Feel free to call the clinic should you have any questions or concerns. The clinic phone number is [phone].  Please show the CHEMO ALERT CARD at check-in to the Emergency Department and triage nurse.   

## 2020-01-01 NOTE — Assessment & Plan Note (Signed)
She has significant acute on chronic pain exacerbated by chemotherapy I recommend addition of methadone I warned her about risk of constipation and sedation and she is willing to try

## 2020-01-01 NOTE — Progress Notes (Signed)
[name] OFF[facility] PROGRESS NOTE  Patient Care Team: Ronita Hipps, MD as PCP - General (Family Medicine) Nicholaus Bloom, MD (Anesthesiology)  ASSESSMENT & PLAN:  Endometrial cancer [facility]) Unfortunately, she tolerated treatment very poorly with significant flare of sciatica and peripheral neuropathy I recommend dose reduction of Taxol at '105mg'$  per metered square which she tolerated better in the past I will reassess her symptoms prior to next visit I recommend minimum 3 cycles of treatment before repeat imaging study  Neuropathy due to medical condition [facility]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 I plan to reduce the dose of treatment as outlined above.  I explained to the patient the rationale of this strategy and reassured the patient it would not compromise the efficacy of treatment   Chronic back pain greater than 3 months duration She has significant acute on chronic pain exacerbated by chemotherapy I recommend addition of methadone I warned her about risk of constipation and sedation and she is willing to try  Steroid-induced hyperglycemia She has significant steroid-induced hyperglycemia We discussed the importance of dietary modification before treatment I will prescribe 1 dose of insulin today   No orders of the defined types were placed in this encounter.   All questions were answered. The patient knows to call the clinic with any problems, questions or concerns. The total time spent in the appointment was 30 minutes encounter with patients including review of chart and various tests results, discussions about plan of care and coordination of care plan   Heath Lark, MD 01/01/2020 2:15 PM  INTERVAL HISTORY: Please see below for problem oriented charting. 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 A Pap was done 09/02/18 showign ASC-H. She was referred onto GYN and seen by Dr. Paula Compton    09/09/2018 Procedure   She underwent endometrial biopsy   09/12/2018 Pathology Results   Endometrial biopsy positive for adenocarcinoma   09/22/2018 Imaging   Ct abdomen and pelvis showed: Diffuse endometrial thickening, consistent with primary endometrial carcinoma.  Pelvic and abdominal retroperitoneal and retrocrural lymphadenopathy, consistent with metastatic disease.  Moderate hepatic steatosis.   09/25/2018 Cancer Staging   Staging form: Corpus Uteri - Carcinoma and Carcinosarcoma, AJCC 8th Edition - Pathologic: Stage IIIC2 (pT2, pN2, cM0) - Signed by Heath Lark, MD on 02/23/2019   10/01/2018 Procedure   Successful placement of a right IJ approach Power Port with ultrasound and fluoroscopic guidance. The catheter is ready for use.   10/02/2018 Tumor Marker   Patient's tumor was tested for the following markers: CA-125 Results of the tumor marker test revealed 578   10/03/2018 - 01/16/2019 Chemotherapy   The patient had carboplatin and Taxol with dose adjustment due to neuropathy x 6 cycles   10/24/2018 Tumor Marker   Patient's tumor was tested for the following markers: CA-125 Results of the tumor marker test revealed 296    Genetic Testing   Patient has genetic testing done  for MSI/MMR. Results revealed MSI stable and MMR normal on Accession [id number].   11/14/2018 Tumor Marker   Patient's tumor was tested for the following markers:  CA-125 Results of the tumor marker test revealed 81   11/27/2018 Imaging   1. Significant decrease in abdominal retroperitoneal and bilateral iliac lymphadenopathy since previous study. 2. Significant decrease in abnormal endometrial soft tissue density since prior study. 3. No new or progressive metastatic disease identified.   12/05/2018 Tumor Marker   Patient's tumor was tested for the following markers: CA-125 Results of the tumor marker test revealed 46.3   12/26/2018 Tumor Marker   Patient's tumor was tested for the following markers: CA-125 Results of the tumor marker test revealed 38.2   01/16/2019 Tumor Marker   Patient's tumor was tested for the following markers: CA-125 Results of the tumor marker test revealed 30.8   02/03/2019 Imaging   1. Numerous retroperitoneal, iliac, and pelvic lymph nodes are stable or slightly decreased in size compared to immediate prior examination dated 11/27/2018 and significantly decreased in size compared to exam dated 09/20/2018.  2.  Unchanged thickening of the endometrium.  3. Other chronic, incidental, and postoperative findings as detailed above.   02/12/2019 Imaging   1. Uterus +/- tubes/ovaries, neoplastic, cervix, bilateral fallopian tubes and ovaries - MIXED HIGH-GRADE SEROUS AND ENDOMETRIAL ADENOCARCINOMA, 6.5 CM. SEE NOTE - CARCINOMA INVOLVES ENTIRE ENDOMETRIUM, ANTERIOR AND POSTERIOR LOWER UTERINE SEGMENTS AND ANTERIOR AND POSTERIOR CERVIX. - CARCINOMA INVADES FOR A DEPTH OF 1.9 CM WHERE MYOMETRIAL THICKNESS IS 2.0 CM (GREATER THAN 50% MYOMETRIAL INVASION) - CARCINOMA IS 1 MM FROM THE CERVICAL MARGIN - NEGATIVE FOR LYMPHOVASCULAR OR PERINEURAL INVASION - BILATERAL UNREMARKABLE FALLOPIAN TUBES AND OVARIES, NEGATIVE FOR CARCINOMA - SEE ONCOLOGY TABLE 2. Lymph node, biopsy, right pelvic - METASTATIC CARCINOMA TO ONE OF TWO LYMPH NODES (1/2) Microscopic Comment 1. (v4.1.0.0) UTERUS, CARCINOMA OR CARCINOSARCOMA Procedure: Total  hysterectomy with bilateral salpingo-oophorectomy Histologic type: Mixed high-grade serous and endometrioid adenocarcinoma Histologic Grade: NA Myometrial invasion: Present Depth of invasion: 19 mm Myometrial thickness: 20 mm Uterine Serosa Involvement: Not identified Cervical stromal involvement: Present Extent of involvement of other organs: NA Lymphovascular invasion: Not identified Regional Lymph Nodes: Examined: 0 Sentinel 2 Non-sentinel 2 Total Lymph nodes with metastasis: 1 Isolated tumor cells (< 0.2 mm): 0 Micrometastasis: (> 0.2 mm and < 2.0 mm): 1 Macrometastasis: (> 2.0 mm): 0 Extracapsular extension: Not identified Tumor block for ancillary studies: 4F MMR / MSI testing: Pending Pathologic Stage Classification (pTNM, AJCC 8th edition): pT2, pN73m FIGO Stage: IIIC1 Representative Tumor Block: 1A, 1C (v4.1.0.0) Diagnosis Note 1. Immunohistochemical stains for p16, p53, ER and Ki-67 show a pattern of staining consistent with mixed high-grade serous and endometrial adenocarcinoma. Dr. KLyndon Codehas reviewed this case and concurs with the above interpretation. A molecular study for microsatellite instability (MSI) and immunostains for MMR-related proteins are pending and will be reported in an addendum.   02/12/2019 Surgery   Pre-operative Diagnosis: endometrial cancer stage IIIC2, s/p neoadjuvant chemotherapy, extreme obesity (BMI 53kg/m2)  Operation: Robotic-assisted laparoscopic total hysterectomy with bilateral salpingoophorectomy, right pelvic lymphadenectomy. 22 modifier for extreme obesity  Surgeon: RDonaciano Eva Assistant Surgeon: LLahoma CrockerMD  Operative Findings:  : extreme obesity, BMI 53kg/m2, with significant retroperitoneal and intraperitoneal adiposity. Obesity necessitated an additional hour of operating time to create safe exposure. Obesity required additional personnel in the operating room for positioning and retraction. Severe obesity  substantially increased the complexity of the procedure. 8cm uterus, grossly normal,  normal appearing cervix. Retroperitoneal fibrosis consistent with nodal positivity. Clinically suspicious right pelvic lymph node. Unable to completely visualize retroperitoneal nodes due to extreme obesity.    02/12/2019 Pathology Results   IMMUNOHISTOCHEMICAL AND MORPHOMETRIC ANALYSIS PERFORMED MANUALLY The tumor cells are POSITIVE for Her2 (3+). Estrogen Receptor: 70%, POSITIVE, STRONG STAINING INTENSITY Progesterone Receptor: 70%, POSITIVE, STRONG STAINING INTENSITY Proliferation Marker Ki67: 20% REFERENCE RANGE ESTROGEN RECEPTOR NEGATIVE 0% POSITIVE =>1% REFERENCE RANGE PROGESTERONE RECEPTOR NEGATIVE 0% POSITIVE =>1% All controls stained appropriately  1. Uterus +/- tubes/ovaries, neoplastic, cervix, bilateral fallopian tubes and ovaries - MIXED HIGH-GRADE SEROUS AND ENDOMETRIAL ADENOCARCINOMA, 6.5 CM. SEE NOTE - CARCINOMA INVOLVES ENTIRE ENDOMETRIUM, ANTERIOR AND POSTERIOR LOWER UTERINE SEGMENTS AND ANTERIOR AND POSTERIOR CERVIX. - CARCINOMA INVADES FOR A DEPTH OF 1.9 CM WHERE MYOMETRIAL THICKNESS IS 2.0 CM (GREATER THAN 50% MYOMETRIAL INVASION) - CARCINOMA IS 1 MM FROM THE CERVICAL MARGIN - NEGATIVE FOR LYMPHOVASCULAR OR PERINEURAL INVASION - BILATERAL UNREMARKABLE FALLOPIAN TUBES AND OVARIES, NEGATIVE FOR CARCINOMA - SEE ONCOLOGY TABLE 2. Lymph node, biopsy, right pelvic - METASTATIC CARCINOMA TO ONE OF TWO LYMPH NODES (1/2) Microscopic Comment 1. (v4.1.0.0) UTERUS, CARCINOMA OR CARCINOSARCOMA Procedure: Total hysterectomy with bilateral salpingo-oophorectomy Histologic type: Mixed high-grade serous and endometrioid adenocarcinoma Histologic Grade: NA Myometrial invasion: Present Depth of invasion: 19 mm Myometrial thickness: 20 mm Uterine Serosa Involvement: Not identified Cervical stromal involvement: Present Extent of involvement of other organs: NA Lymphovascular invasion: Not  identified Regional Lymph Nodes: Examined: 0 Sentinel 2 Non-sentinel 2 Total Lymph nodes with metastasis: 1 Isolated tumor cells (< 0.2 mm): 0 Micrometastasis: (> 0.2 mm and < 2.0 mm): 1 Macrometastasis: (> 2.0 mm): 0 Extracapsular extension: Not identified Tumor block for ancillary studies: 19F MMR / MSI testing: Pending Pathologic Stage Classification (pTNM, AJCC 8th edition): pT2, pN35m FIGO Stage: IIIC1 Representative Tumor Block: 1A, 1C   03/17/2019 Echocardiogram   1. The left ventricle has normal systolic function, with an ejection fraction of 55-60%. The cavity size was normal. There is moderate concentric left ventricular hypertrophy. Left ventricular diastolic Doppler parameters are consistent with impaired relaxation.  2. The right ventricle has normal systolic function. The cavity was normal. There is no increase in right ventricular wall thickness.  3. GLS: -15.5%, no prior study available for comparison.   03/17/2019 Tumor Marker   Patient's tumor was tested for the following markers: CA-125 Results of the tumor marker test revealed 43   03/20/2019 -  Chemotherapy   The patient had trastuzumab for chemotherapy treatment.     04/30/2019 Tumor Marker   Patient's tumor was tested for the following markers:CA-125 Results of the tumor marker test revealed 29   05/20/2019 Imaging   1. No evidence of metastatic disease. 2. Small pelvic and left paracolic gutter ascites, new. 3. Aortic atherosclerosis (ICD10-170.0). Coronary artery calcification.     05/26/2019 Echocardiogram      1. The left ventricle has normal systolic function with an ejection fraction of 60-65%. The cavity size was normal. There is mildly increased left ventricular wall thickness. Left ventricular diastolic parameters were normal.  2. GLS -16.4% (underestimated due to poor endocardial tracking).  3. The right ventricle has normal systolic function. The cavity was normal. There is no increase in right  ventricular wall thickness.  4. Mild calcification of the mitral valve leaflet.  5. Mild calcification of the aortic valve. No stenosis of the aortic valve.  6. The aorta is normal in size and structure.   09/02/2019 Imaging   No evidence  of recurrent or metastatic carcinoma within the abdomen or pelvis.   Colonic diverticulosis. No radiographic evidence of diverticulitis.   Increased large stool burden noted; recommend clinical correlation for possible constipation.   09/02/2019 Echocardiogram   1. Left ventricular ejection fraction, by visual estimation, is 60 to 65%. The left ventricle has normal function. There is no left ventricular hypertrophy.  2. Global right ventricle has normal systolic function.The right ventricular size is normal. No increase in right ventricular wall thickness.  3. Left atrial size was normal.  4. Right atrial size was normal.  5. The mitral valve is normal in structure. No evidence of mitral valve regurgitation.  6. The tricuspid valve is normal in structure. Tricuspid valve regurgitation is trivial.  7. The aortic valve is normal in structure. Aortic valve regurgitation is not visualized.  8. The pulmonic valve was normal in structure. Pulmonic valve regurgitation is trivial.  9. Mildly elevated pulmonary artery systolic pressure. 10. The average left ventricular global longitudinal strain is -26.9 %.   09/03/2019 Tumor Marker   Patient's tumor was tested for the following markers: CA-125 Results of the tumor marker test revealed 13.5   11/05/2019 Tumor Marker   Patient's tumor was tested for the following markers: CA-125 Results of the tumor marker test revealed 27.   11/25/2019 Imaging   1. New omental nodules suspicious for early peritoneal spread of malignancy. 2. New thickening along the right paracolic gutter concerning for potential tumor. 3. Other imaging findings of potential clinical significance: Mild cardiomegaly. Stable lateral segment  left hepatic lobe hypodense lesions, likely cysts. Multilevel posterior decompression with multilevel foraminal impingement. 4. 4 mm left lower lobe pulmonary nodule not readily appreciable on the prior exam, could be inflammatory or neoplastic. Surveillance suggested. 5. Other imaging findings of potential clinical significance: Mild cardiomegaly. Suspected hepatic cysts. Multilevel lumbar impingement.   Aortic Atherosclerosis (ICD10-I70.0).   11/26/2019 Tumor Marker   Patient's tumor was tested for the following markers: CA-125 Results of the tumor marker test revealed 129   12/11/2019 -  Chemotherapy   The patient had palonosetron (ALOXI) injection 0.25 mg, 0.25 mg, Intravenous,  Once, 2 of 6 cycles Administration: 0.25 mg (12/11/2019) CARBOplatin (PARAPLATIN) 700 mg in sodium chloride 0.9 % 250 mL chemo infusion, 700 mg, Intravenous,  Once, 2 of 6 cycles Administration: 700 mg (12/11/2019) fosaprepitant (EMEND) 150 mg in sodium chloride 0.9 % 145 mL IVPB, 150 mg, Intravenous,  Once, 2 of 6 cycles Administration: 150 mg (12/11/2019) PACLitaxel (TAXOL) 264 mg in sodium chloride 0.9 % 250 mL chemo infusion (> 51m/m2), 135 mg/m2 = 264 mg, Intravenous,  Once, 2 of 6 cycles Dose modification: 101.25 mg/m2 (75 % of original dose 135 mg/m2, Cycle 2, Reason: Dose Not Tolerated) Administration: 264 mg (12/11/2019)  for chemotherapy treatment.      REVIEW OF SYSTEMS:   Constitutional: Denies fevers, chills or abnormal weight loss Eyes: Denies blurriness of vision Ears, nose, mouth, throat, and face: Denies mucositis or sore throat Respiratory: Denies cough, dyspnea or wheezes Cardiovascular: Denies palpitation, chest discomfort or lower extremity swelling Gastrointestinal:  Denies nausea, heartburn or change in bowel habits Skin: Denies abnormal skin rashes Lymphatics: Denies new lymphadenopathy or easy bruising Behavioral/Psych: Mood is stable, no new changes  All other systems were reviewed  with the patient and are negative.  I have reviewed the past medical history, past surgical history, social history and family history with the patient and they are unchanged from previous note.  ALLERGIES:  is allergic  to tegaderm ag mesh [silver].  MEDICATIONS:  Current Outpatient Medications  Medication Sig Dispense Refill  . dexamethasone (DECADRON) 4 MG tablet Take 2 tabs at the night before and 2 tabs the morning of chemotherapy, every 3 weeks, by mouth x 6 cycles 36 tablet 6  . diphenhydrAMINE (BENADRYL) 25 mg capsule Take 100 mg by mouth 2 (two) times a day.    . furosemide (LASIX) 20 MG tablet Take 1 tablet (20 mg total) by mouth daily. 30 tablet 1  . gabapentin (NEURONTIN) 400 MG capsule Take 3 capsules (1,200 mg total) by mouth 3 (three) times daily. 270 capsule 11  . lidocaine-prilocaine (EMLA) cream Apply to affected area once 30 g 3  . losartan-hydrochlorothiazide (HYZAAR) 50-12.5 MG tablet Take 2 tablets by mouth daily.    . methadone (DOLOPHINE) 10 MG tablet Take 2 tablets (20 mg total) by mouth every 12 (twelve) hours. 60 tablet 0  . ondansetron (ZOFRAN) 8 MG tablet Take 8 mg by mouth every 8 (eight) hours as needed.    Marland Kitchen oxyCODONE-acetaminophen (PERCOCET) 10-325 MG tablet Take 1-2 tablets by mouth every 6 (six) hours as needed for pain.    Marland Kitchen prochlorperazine (COMPAZINE) 10 MG tablet TAKE 1 TABLET BY MOUTH EVERY 6 HOURS AS NEEDED FOR NAUSEA OR VOMITING 30 tablet 1   No current facility-administered medications for this visit.   Facility-Administered Medications Ordered in Other Visits  Medication Dose Route Frequency Provider Last Rate Last Admin  . CARBOplatin (PARAPLATIN) 700 mg in sodium chloride 0.9 % 250 mL chemo infusion  700 mg Intravenous Once Alvy Bimler, Elianis Fischbach, MD      . heparin lock flush 100 unit/mL  500 Units Intracatheter Once PRN Alvy Bimler, Lilyanah Celestin, MD      . PACLitaxel (TAXOL) 198 mg in sodium chloride 0.9 % 250 mL chemo infusion (> 31m/m2)  101.25 mg/m2 (Treatment Plan  Adjusted) Intravenous Once GHeath Lark MD 94 mL/hr at 01/01/20 1207 198 mg at 01/01/20 1207  . sodium chloride flush (NS) 0.9 % injection 10 mL  10 mL Intracatheter PRN GAlvy Bimler Kaylea Mounsey, MD        PHYSICAL EXAMINATION: ECOG PERFORMANCE STATUS: 1 - Symptomatic but completely ambulatory  Vitals:   01/01/20 0952  BP: (!) 164/90  Pulse: 95  Resp: 18  Temp: 98 F (36.7 C)  SpO2: 97%   Filed Weights   01/01/20 0952  Weight: 294 lb 12.8 oz (133.7 kg)    GENERAL:alert, no distress and comfortable SKIN: skin color, texture, turgor are normal, no rashes or significant lesions EYES: normal, Conjunctiva are pink and non-injected, sclera clear OROPHARYNX:no exudate, no erythema and lips, buccal mucosa, and tongue normal  NECK: supple, thyroid normal size, non-tender, without nodularity LYMPH:  no palpable lymphadenopathy in the cervical, axillary or inguinal LUNGS: clear to auscultation and percussion with normal breathing effort HEART: regular rate & rhythm and no murmurs and no lower extremity edema ABDOMEN:abdomen soft, non-tender and normal bowel sounds Musculoskeletal:no cyanosis of digits and no clubbing  NEURO: alert & oriented x 3 with fluent speech, no focal motor/sensory deficits  LABORATORY DATA:  I have reviewed the data as listed    Component Value Date/Time   NA 140 01/01/2020 0921   K 3.5 01/01/2020 0921   CL 100 01/01/2020 0921   CO2 27 01/01/2020 0921   GLUCOSE 269 (H) 01/01/2020 0921   BUN 17 01/01/2020 0921   CREATININE 0.80 01/01/2020 0921   CALCIUM 9.0 01/01/2020 0921   PROT 7.1 01/01/2020 05859  ALBUMIN 3.5 01/01/2020 0921   AST 13 (L) 01/01/2020 0921   ALT 17 01/01/2020 0921   ALKPHOS 112 01/01/2020 0921   BILITOT 0.3 01/01/2020 0921   GFRNONAA >60 01/01/2020 0921   GFRAA >60 01/01/2020 0921    No results found for: SPEP, UPEP  Lab Results  Component Value Date   WBC 7.9 01/01/2020   NEUTROABS 6.5 01/01/2020   HGB 12.5 01/01/2020   HCT 38.0  01/01/2020   MCV 94.8 01/01/2020   PLT 172 01/01/2020      Chemistry      Component Value Date/Time   NA 140 01/01/2020 0921   K 3.5 01/01/2020 0921   CL 100 01/01/2020 0921   CO2 27 01/01/2020 0921   BUN 17 01/01/2020 0921   CREATININE 0.80 01/01/2020 0921      Component Value Date/Time   CALCIUM 9.0 01/01/2020 0921   ALKPHOS 112 01/01/2020 0921   AST 13 (L) 01/01/2020 0921   ALT 17 01/01/2020 0921   BILITOT 0.3 01/01/2020 6384

## 2020-01-01 NOTE — Assessment & Plan Note (Signed)
She has significant steroid-induced hyperglycemia We discussed the importance of dietary modification before treatment I will prescribe 1 dose of insulin today

## 2020-01-01 NOTE — Telephone Encounter (Signed)
Scheduled per 3/19 sch msg. Printed calendar and took to pt in tx area.

## 2020-01-01 NOTE — Assessment & Plan Note (Signed)
she has mild peripheral neuropathy, likely related to side effects of treatment. °I plan to reduce the dose of treatment as outlined above.  °I explained to the patient the rationale of this strategy and reassured the patient it would not compromise the efficacy of treatment ° °

## 2020-01-01 NOTE — Progress Notes (Signed)
Post insulin CBG 218

## 2020-01-01 NOTE — Assessment & Plan Note (Signed)
Unfortunately, she tolerated treatment very poorly with significant flare of sciatica and peripheral neuropathy I recommend dose reduction of Taxol at 105mg  per metered square which she tolerated better in the past I will reassess her symptoms prior to next visit I recommend minimum 3 cycles of treatment before repeat imaging study

## 2020-01-01 NOTE — Patient Instructions (Signed)

## 2020-01-02 LAB — CA 125: Cancer Antigen (CA) 125: 123 U/mL — ABNORMAL HIGH (ref 0.0–38.1)

## 2020-01-04 ENCOUNTER — Telehealth: Payer: Self-pay

## 2020-01-04 NOTE — Telephone Encounter (Signed)
Called and given below message. She verbalized understanding. 

## 2020-01-04 NOTE — Telephone Encounter (Signed)
-----   Message from Heath Lark, MD sent at 01/04/2020  8:43 AM EDT ----- Regarding: pls call and let her know CA-125 is slightly better

## 2020-01-18 NOTE — Progress Notes (Signed)
Pharmacist Chemotherapy Monitoring - Follow Up Assessment    I verify that I have reviewed each item in the below checklist:  . Regimen for the patient is scheduled for the appropriate day and plan matches scheduled date. Marland Kitchen Appropriate non-routine labs are ordered dependent on drug ordered. . If applicable, additional medications reviewed and ordered per protocol based on lifetime cumulative doses and/or treatment regimen.   Plan for follow-up and/or issues identified: No . I-vent associated with next due treatment: No . MD and/or nursing notified: No  Kathryn [name] 01/18/2020 3:24 PM

## 2020-01-22 ENCOUNTER — Inpatient Hospital Stay: Payer: Medicare Other | Attending: Hematology and Oncology

## 2020-01-22 ENCOUNTER — Other Ambulatory Visit: Payer: Self-pay

## 2020-01-22 ENCOUNTER — Inpatient Hospital Stay: Payer: Medicare Other | Admitting: Hematology and Oncology

## 2020-01-22 ENCOUNTER — Encounter: Payer: Self-pay | Admitting: Hematology and Oncology

## 2020-01-22 ENCOUNTER — Inpatient Hospital Stay: Payer: Medicare Other

## 2020-01-22 VITALS — BP 146/94 | HR 87 | Temp 98.3°F | Resp 18 | Ht 63.0 in | Wt 299.6 lb

## 2020-01-22 DIAGNOSIS — G629 Polyneuropathy, unspecified: Secondary | ICD-10-CM | POA: Insufficient documentation

## 2020-01-22 DIAGNOSIS — K5909 Other constipation: Secondary | ICD-10-CM | POA: Diagnosis not present

## 2020-01-22 DIAGNOSIS — G8929 Other chronic pain: Secondary | ICD-10-CM

## 2020-01-22 DIAGNOSIS — D61818 Other pancytopenia: Secondary | ICD-10-CM | POA: Insufficient documentation

## 2020-01-22 DIAGNOSIS — M549 Dorsalgia, unspecified: Secondary | ICD-10-CM | POA: Diagnosis not present

## 2020-01-22 DIAGNOSIS — C541 Malignant neoplasm of endometrium: Secondary | ICD-10-CM

## 2020-01-22 DIAGNOSIS — I10 Essential (primary) hypertension: Secondary | ICD-10-CM | POA: Insufficient documentation

## 2020-01-22 DIAGNOSIS — Z9221 Personal history of antineoplastic chemotherapy: Secondary | ICD-10-CM | POA: Insufficient documentation

## 2020-01-22 DIAGNOSIS — Z5111 Encounter for antineoplastic chemotherapy: Secondary | ICD-10-CM | POA: Diagnosis not present

## 2020-01-22 DIAGNOSIS — G63 Polyneuropathy in diseases classified elsewhere: Secondary | ICD-10-CM | POA: Diagnosis not present

## 2020-01-22 DIAGNOSIS — Z7189 Other specified counseling: Secondary | ICD-10-CM

## 2020-01-22 LAB — CMP (CANCER CENTER ONLY)
ALT: 17 U/L (ref 0–44)
AST: 16 U/L (ref 15–41)
Albumin: 3.5 g/dL (ref 3.5–5.0)
Alkaline Phosphatase: 106 U/L (ref 38–126)
Anion gap: 11 (ref 5–15)
BUN: 12 mg/dL (ref 8–23)
CO2: 28 mmol/L (ref 22–32)
Calcium: 9 mg/dL (ref 8.9–10.3)
Chloride: 101 mmol/L (ref 98–111)
Creatinine: 0.78 mg/dL (ref 0.44–1.00)
GFR, Est AFR Am: >60 mL/min (ref >60)
GFR, Estimated: >60 mL/min (ref >60)
Glucose, Bld: 188 mg/dL — ABNORMAL HIGH (ref 70–99)
Potassium: 3.4 mmol/L — ABNORMAL LOW (ref 3.5–5.1)
Sodium: 140 mmol/L (ref 135–145)
Total Bilirubin: 0.5 mg/dL (ref 0.3–1.2)
Total Protein: 7.1 g/dL (ref 6.5–8.1)

## 2020-01-22 LAB — CBC WITH DIFFERENTIAL (CANCER CENTER ONLY)
Abs Immature Granulocytes: 0.03 10*3/uL (ref 0.00–0.07)
Basophils Absolute: 0 10*3/uL (ref 0.0–0.1)
Basophils Relative: 0 %
Eosinophils Absolute: 0 10*3/uL (ref 0.0–0.5)
Eosinophils Relative: 0 %
HCT: 33.9 % — ABNORMAL LOW (ref 36.0–46.0)
Hemoglobin: 11.1 g/dL — ABNORMAL LOW (ref 12.0–15.0)
Immature Granulocytes: 1 %
Lymphocytes Relative: 14 %
Lymphs Abs: 0.8 10*3/uL (ref 0.7–4.0)
MCH: 32.4 pg (ref 26.0–34.0)
MCHC: 32.7 g/dL (ref 30.0–36.0)
MCV: 98.8 fL (ref 80.0–100.0)
Monocytes Absolute: 0.2 10*3/uL (ref 0.1–1.0)
Monocytes Relative: 4 %
Neutro Abs: 4.6 10*3/uL (ref 1.7–7.7)
Neutrophils Relative %: 81 %
Platelet Count: 155 10*3/uL (ref 150–400)
RBC: 3.43 MIL/uL — ABNORMAL LOW (ref 3.87–5.11)
RDW: 18.4 % — ABNORMAL HIGH (ref 11.5–15.5)
WBC Count: 5.6 10*3/uL (ref 4.0–10.5)
nRBC: 0 % (ref 0.0–0.2)

## 2020-01-22 MED ORDER — SODIUM CHLORIDE 0.9% FLUSH
10.0000 mL | INTRAVENOUS | Status: DC | PRN
  Administered 2020-01-22: 10 mL
  Filled 2020-01-22: qty 10

## 2020-01-22 MED ORDER — SODIUM CHLORIDE 0.9 % IV SOLN
150.0000 mg | Freq: Once | INTRAVENOUS | Status: AC
  Administered 2020-01-22: 150 mg via INTRAVENOUS
  Filled 2020-01-22: qty 150

## 2020-01-22 MED ORDER — FAMOTIDINE IN NACL 20-0.9 MG/50ML-% IV SOLN
20.0000 mg | Freq: Once | INTRAVENOUS | Status: AC
  Administered 2020-01-22: 20 mg via INTRAVENOUS

## 2020-01-22 MED ORDER — FAMOTIDINE IN NACL 20-0.9 MG/50ML-% IV SOLN
INTRAVENOUS | Status: AC
  Filled 2020-01-22: qty 50

## 2020-01-22 MED ORDER — DIPHENHYDRAMINE HCL 50 MG/ML IJ SOLN
INTRAMUSCULAR | Status: AC
  Filled 2020-01-22: qty 1

## 2020-01-22 MED ORDER — SODIUM CHLORIDE 0.9 % IV SOLN
696.0000 mg | Freq: Once | INTRAVENOUS | Status: AC
  Administered 2020-01-22: 700 mg via INTRAVENOUS
  Filled 2020-01-22: qty 70

## 2020-01-22 MED ORDER — DIPHENHYDRAMINE HCL 50 MG/ML IJ SOLN
25.0000 mg | Freq: Once | INTRAMUSCULAR | Status: AC
  Administered 2020-01-22: 25 mg via INTRAVENOUS

## 2020-01-22 MED ORDER — SODIUM CHLORIDE 0.9 % IV SOLN
Freq: Once | INTRAVENOUS | Status: AC
  Filled 2020-01-22: qty 250

## 2020-01-22 MED ORDER — PALONOSETRON HCL INJECTION 0.25 MG/5ML
0.2500 mg | Freq: Once | INTRAVENOUS | Status: AC
  Administered 2020-01-22: 0.25 mg via INTRAVENOUS

## 2020-01-22 MED ORDER — SODIUM CHLORIDE 0.9 % IV SOLN
10.0000 mg | Freq: Once | INTRAVENOUS | Status: AC
  Administered 2020-01-22: 10 mg via INTRAVENOUS
  Filled 2020-01-22: qty 10

## 2020-01-22 MED ORDER — PALONOSETRON HCL INJECTION 0.25 MG/5ML
INTRAVENOUS | Status: AC
  Filled 2020-01-22: qty 5

## 2020-01-22 MED ORDER — HEPARIN SOD (PORK) LOCK FLUSH 100 UNIT/ML IV SOLN
500.0000 [IU] | Freq: Once | INTRAVENOUS | Status: AC | PRN
  Administered 2020-01-22: 500 [IU]
  Filled 2020-01-22: qty 5

## 2020-01-22 MED ORDER — SODIUM CHLORIDE 0.9 % IV SOLN
101.2500 mg/m2 | Freq: Once | INTRAVENOUS | Status: AC
  Administered 2020-01-22: 198 mg via INTRAVENOUS
  Filled 2020-01-22: qty 33

## 2020-01-22 NOTE — Assessment & Plan Note (Signed)
Her neuropathy is stable with recent medication adjustment She will continue to take medicine as prescribed

## 2020-01-22 NOTE — Assessment & Plan Note (Signed)
We discussed the importance of laxative therapy 

## 2020-01-22 NOTE — Patient Instructions (Signed)
[place] Discharge Instructions for Patients Receiving Chemotherapy  Today you received the following chemotherapy agents Taxol/Carboplain  To help prevent nausea and vomiting after your treatment, we encourage you to take your nausea medication as directed   If you develop nausea and vomiting that is not controlled by your nausea medication, call the clinic.   BELOW ARE SYMPTOMS THAT SHOULD BE REPORTED IMMEDIATELY:  *FEVER GREATER THAN 100.5 F  *CHILLS WITH OR WITHOUT FEVER  NAUSEA AND VOMITING THAT IS NOT CONTROLLED WITH YOUR NAUSEA MEDICATION  *UNUSUAL SHORTNESS OF BREATH  *UNUSUAL BRUISING OR BLEEDING  TENDERNESS IN MOUTH AND THROAT WITH OR WITHOUT PRESENCE OF ULCERS  *URINARY PROBLEMS  *BOWEL PROBLEMS  UNUSUAL RASH Items with * indicate a potential emergency and should be followed up as soon as possible.  Feel free to call the clinic should you have any questions or concerns. The clinic phone number is [phone].  Please show the [place] at check-in to the Emergency Department and triage nurse.

## 2020-01-22 NOTE — Assessment & Plan Note (Signed)
Her chronic back pain is much improved with recent methadone We will continue the same The patient is allowed to take methadone twice a day while on her prescribed oxycodone by her pain medicine doctor

## 2020-01-22 NOTE — Assessment & Plan Note (Signed)
She tolerated treatment much better with recent addition of treatment for pain with methadone We will continue with similar dose adjustment I will reassess her symptoms prior to next visit I plan to repeat CT imaging before her next cycle of treatment

## 2020-01-22 NOTE — Progress Notes (Signed)
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 CT ABDOMEN PELVIS W CONTRAST    Standing Status:   Future    Standing Expiration Date:   01/21/2021    Order Specific Question:   If indicated for the ordered procedure, I authorize the administration of contrast media per Radiology protocol    Answer:   Yes    Order Specific Question:   Preferred imaging location?    Answer:   [facility]    Order Specific Question:   Radiology Contrast Protocol - do NOT remove file path    Answer:   [email or web address]    All questions were answered. The patient knows to call the clinic with any problems, questions or concerns. The total time spent in the appointment was 25 minutes encounter with patients including review of chart and various tests results, discussions about plan of care and coordination of care plan   Heath Lark, MD 01/22/2020 11:25 AM  INTERVAL HISTORY: Please see below for problem oriented charting. 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 A Pap was done 09/02/18 showign ASC-H. She was referred onto GYN and seen by Dr. Paula Compton    09/09/2018 Procedure   She underwent endometrial biopsy   09/12/2018 Pathology Results   Endometrial biopsy positive for adenocarcinoma   09/22/2018 Imaging   Ct abdomen and pelvis showed: Diffuse endometrial thickening, consistent with primary endometrial carcinoma.  Pelvic and abdominal retroperitoneal and retrocrural lymphadenopathy, consistent with metastatic disease.  Moderate hepatic steatosis.   09/25/2018 Cancer Staging   Staging form: Corpus Uteri - Carcinoma and Carcinosarcoma, AJCC 8th Edition - Pathologic: Stage IIIC2 (pT2, pN2, cM0) - Signed by Heath Lark, MD on 02/23/2019   10/01/2018 Procedure   Successful placement of a right IJ approach Power Port with ultrasound and fluoroscopic guidance. The catheter is ready for use.   10/02/2018 Tumor Marker   Patient's tumor was tested for the following markers: CA-125 Results of the tumor marker test revealed 578   10/03/2018 - 01/16/2019 Chemotherapy   The patient had carboplatin and Taxol with dose adjustment due to neuropathy x 6 cycles  10/24/2018 Tumor Marker   Patient's tumor was tested for the following markers: CA-125 Results of the tumor marker test revealed 296    Genetic Testing   Patient has genetic testing done for MSI/MMR. Results revealed MSI stable and MMR normal on Accession [id number].   11/14/2018 Tumor  Marker   Patient's tumor was tested for the following markers: CA-125 Results of the tumor marker test revealed 81   11/27/2018 Imaging   1. Significant decrease in abdominal retroperitoneal and bilateral iliac lymphadenopathy since previous study. 2. Significant decrease in abnormal endometrial soft tissue density since prior study. 3. No new or progressive metastatic disease identified.   12/05/2018 Tumor Marker   Patient's tumor was tested for the following markers: CA-125 Results of the tumor marker test revealed 46.3   12/26/2018 Tumor Marker   Patient's tumor was tested for the following markers: CA-125 Results of the tumor marker test revealed 38.2   01/16/2019 Tumor Marker   Patient's tumor was tested for the following markers: CA-125 Results of the tumor marker test revealed 30.8   02/03/2019 Imaging   1. Numerous retroperitoneal, iliac, and pelvic lymph nodes are stable or slightly decreased in size compared to immediate prior examination dated 11/27/2018 and significantly decreased in size compared to exam dated 09/20/2018.  2.  Unchanged thickening of the endometrium.  3. Other chronic, incidental, and postoperative findings as detailed above.   02/12/2019 Imaging   1. Uterus +/- tubes/ovaries, neoplastic, cervix, bilateral fallopian tubes and ovaries - MIXED HIGH-GRADE SEROUS AND ENDOMETRIAL ADENOCARCINOMA, 6.5 CM. SEE Kathryn [name] - CARCINOMA INVOLVES ENTIRE ENDOMETRIUM, ANTERIOR AND POSTERIOR LOWER UTERINE SEGMENTS AND ANTERIOR AND POSTERIOR CERVIX. - CARCINOMA INVADES FOR A DEPTH OF 1.9 CM WHERE MYOMETRIAL THICKNESS IS 2.0 CM (GREATER THAN 50% MYOMETRIAL INVASION) - CARCINOMA IS 1 MM FROM THE CERVICAL MARGIN - NEGATIVE FOR LYMPHOVASCULAR OR PERINEURAL INVASION - BILATERAL UNREMARKABLE FALLOPIAN TUBES AND OVARIES, NEGATIVE FOR CARCINOMA - SEE ONCOLOGY TABLE 2. Lymph node, biopsy, right pelvic - METASTATIC CARCINOMA TO ONE OF TWO LYMPH NODES (1/2) Microscopic Comment 1.  (v4.1.0.0) UTERUS, CARCINOMA OR CARCINOSARCOMA Procedure: Total hysterectomy with bilateral salpingo-oophorectomy Histologic type: Mixed high-grade serous and endometrioid adenocarcinoma Histologic Grade: NA Myometrial invasion: Present Depth of invasion: 19 mm Myometrial thickness: 20 mm Uterine Serosa Involvement: Not identified Cervical stromal involvement: Present Extent of involvement of other organs: NA Lymphovascular invasion: Not identified Regional Lymph Nodes: Examined: 0 Sentinel 2 Non-sentinel 2 Total Lymph nodes with metastasis: 1 Isolated tumor cells (< 0.2 mm): 0 Micrometastasis: (> 0.2 mm and < 2.0 mm): 1 Macrometastasis: (> 2.0 mm): 0 Extracapsular extension: Not identified Tumor block for ancillary studies: 19F MMR / MSI testing: Pending Pathologic Stage Classification (pTNM, AJCC 8th edition): pT2, pN42m FIGO Stage: IIIC1 Representative Tumor Block: 1A, 1C (v4.1.0.0) Diagnosis Kathryn [name] 1. Immunohistochemical stains for p16, p53, ER and Ki-67 show a pattern of staining consistent with mixed high-grade serous and endometrial adenocarcinoma. Dr. KLyndon Codehas reviewed this case and concurs with the above interpretation. A molecular study for microsatellite instability (MSI) and immunostains for MMR-related proteins are pending and will be reported in an addendum.   02/12/2019 Surgery   Pre-operative Diagnosis: endometrial cancer stage IIIC2, s/p neoadjuvant chemotherapy, extreme obesity (BMI 53kg/m2)  Operation: Robotic-assisted laparoscopic total hysterectomy with bilateral salpingoophorectomy, right pelvic lymphadenectomy. 22 modifier for extreme obesity  Surgeon: RDonaciano Eva Assistant Surgeon: LLahoma CrockerMD  Operative Findings:  : extreme obesity, BMI 53kg/m2, with significant retroperitoneal and intraperitoneal adiposity. Obesity necessitated an additional  hour of operating time to create safe exposure. Obesity required additional personnel in the  operating room for positioning and retraction. Severe obesity substantially increased the complexity of the procedure. 8cm uterus, grossly normal, normal appearing cervix. Retroperitoneal fibrosis consistent with nodal positivity. Clinically suspicious right pelvic lymph node. Unable to completely visualize retroperitoneal nodes due to extreme obesity.    02/12/2019 Pathology Results   IMMUNOHISTOCHEMICAL AND MORPHOMETRIC ANALYSIS PERFORMED MANUALLY The tumor cells are POSITIVE for Her2 (3+). Estrogen Receptor: 70%, POSITIVE, STRONG STAINING INTENSITY Progesterone Receptor: 70%, POSITIVE, STRONG STAINING INTENSITY Proliferation Marker Ki67: 20% REFERENCE RANGE ESTROGEN RECEPTOR NEGATIVE 0% POSITIVE =>1% REFERENCE RANGE PROGESTERONE RECEPTOR NEGATIVE 0% POSITIVE =>1% All controls stained appropriately  1. Uterus +/- tubes/ovaries, neoplastic, cervix, bilateral fallopian tubes and ovaries - MIXED HIGH-GRADE SEROUS AND ENDOMETRIAL ADENOCARCINOMA, 6.5 CM. SEE Kathryn [name] - CARCINOMA INVOLVES ENTIRE ENDOMETRIUM, ANTERIOR AND POSTERIOR LOWER UTERINE SEGMENTS AND ANTERIOR AND POSTERIOR CERVIX. - CARCINOMA INVADES FOR A DEPTH OF 1.9 CM WHERE MYOMETRIAL THICKNESS IS 2.0 CM (GREATER THAN 50% MYOMETRIAL INVASION) - CARCINOMA IS 1 MM FROM THE CERVICAL MARGIN - NEGATIVE FOR LYMPHOVASCULAR OR PERINEURAL INVASION - BILATERAL UNREMARKABLE FALLOPIAN TUBES AND OVARIES, NEGATIVE FOR CARCINOMA - SEE ONCOLOGY TABLE 2. Lymph node, biopsy, right pelvic - METASTATIC CARCINOMA TO ONE OF TWO LYMPH NODES (1/2) Microscopic Comment 1. (v4.1.0.0) UTERUS, CARCINOMA OR CARCINOSARCOMA Procedure: Total hysterectomy with bilateral salpingo-oophorectomy Histologic type: Mixed high-grade serous and endometrioid adenocarcinoma Histologic Grade: NA Myometrial invasion: Present Depth of invasion: 19 mm Myometrial thickness: 20 mm Uterine Serosa Involvement: Not identified Cervical stromal involvement: Present Extent of  involvement of other organs: NA Lymphovascular invasion: Not identified Regional Lymph Nodes: Examined: 0 Sentinel 2 Non-sentinel 2 Total Lymph nodes with metastasis: 1 Isolated tumor cells (< 0.2 mm): 0 Micrometastasis: (> 0.2 mm and < 2.0 mm): 1 Macrometastasis: (> 2.0 mm): 0 Extracapsular extension: Not identified Tumor block for ancillary studies: 36F MMR / MSI testing: Pending Pathologic Stage Classification (pTNM, AJCC 8th edition): pT2, pN10m FIGO Stage: IIIC1 Representative Tumor Block: 1A, 1C   03/17/2019 Echocardiogram   1. The left ventricle has normal systolic function, with an ejection fraction of 55-60%. The cavity size was normal. There is moderate concentric left ventricular hypertrophy. Left ventricular diastolic Doppler parameters are consistent with impaired relaxation.  2. The right ventricle has normal systolic function. The cavity was normal. There is no increase in right ventricular wall thickness.  3. GLS: -15.5%, no prior study available for comparison.   03/17/2019 Tumor Marker   Patient's tumor was tested for the following markers: CA-125 Results of the tumor marker test revealed 43   03/20/2019 -  Chemotherapy   The patient had trastuzumab for chemotherapy treatment.     04/30/2019 Tumor Marker   Patient's tumor was tested for the following markers:CA-125 Results of the tumor marker test revealed 29   05/20/2019 Imaging   1. No evidence of metastatic disease. 2. Small pelvic and left paracolic gutter ascites, new. 3. Aortic atherosclerosis (ICD10-170.0). Coronary artery calcification.     05/26/2019 Echocardiogram      1. The left ventricle has normal systolic function with an ejection fraction of 60-65%. The cavity size was normal. There is mildly increased left ventricular wall thickness. Left ventricular diastolic parameters were normal.  2. GLS -16.4% (underestimated due to poor endocardial tracking).  3. The right ventricle has normal systolic  function. The cavity was normal. There is no increase in right ventricular wall thickness.  4. Mild calcification of the mitral  valve leaflet.  5. Mild calcification of the aortic valve. No stenosis of the aortic valve.  6. The aorta is normal in size and structure.   09/02/2019 Imaging   No evidence of recurrent or metastatic carcinoma within the abdomen or pelvis.   Colonic diverticulosis. No radiographic evidence of diverticulitis.   Increased large stool burden noted; recommend clinical correlation for possible constipation.   09/02/2019 Echocardiogram   1. Left ventricular ejection fraction, by visual estimation, is 60 to 65%. The left ventricle has normal function. There is no left ventricular hypertrophy.  2. Global right ventricle has normal systolic function.The right ventricular size is normal. No increase in right ventricular wall thickness.  3. Left atrial size was normal.  4. Right atrial size was normal.  5. The mitral valve is normal in structure. No evidence of mitral valve regurgitation.  6. The tricuspid valve is normal in structure. Tricuspid valve regurgitation is trivial.  7. The aortic valve is normal in structure. Aortic valve regurgitation is not visualized.  8. The pulmonic valve was normal in structure. Pulmonic valve regurgitation is trivial.  9. Mildly elevated pulmonary artery systolic pressure. 10. The average left ventricular global longitudinal strain is -26.9 %.   09/03/2019 Tumor Marker   Patient's tumor was tested for the following markers: CA-125 Results of the tumor marker test revealed 13.5   11/05/2019 Tumor Marker   Patient's tumor was tested for the following markers: CA-125 Results of the tumor marker test revealed 27.   11/25/2019 Imaging   1. New omental nodules suspicious for early peritoneal spread of malignancy. 2. New thickening along the right paracolic gutter concerning for potential tumor. 3. Other imaging findings of potential  clinical significance: Mild cardiomegaly. Stable lateral segment left hepatic lobe hypodense lesions, likely cysts. Multilevel posterior decompression with multilevel foraminal impingement. 4. 4 mm left lower lobe pulmonary nodule not readily appreciable on the prior exam, could be inflammatory or neoplastic. Surveillance suggested. 5. Other imaging findings of potential clinical significance: Mild cardiomegaly. Suspected hepatic cysts. Multilevel lumbar impingement.   Aortic Atherosclerosis (ICD10-I70.0).   11/26/2019 Tumor Marker   Patient's tumor was tested for the following markers: CA-125 Results of the tumor marker test revealed 129   12/11/2019 -  Chemotherapy   The patient had palonosetron (ALOXI) injection 0.25 mg, 0.25 mg, Intravenous,  Once, 3 of 6 cycles Administration: 0.25 mg (12/11/2019), 0.25 mg (01/01/2020) CARBOplatin (PARAPLATIN) 700 mg in sodium chloride 0.9 % 250 mL chemo infusion, 700 mg, Intravenous,  Once, 3 of 6 cycles Administration: 700 mg (12/11/2019), 700 mg (01/01/2020) fosaprepitant (EMEND) 150 mg in sodium chloride 0.9 % 145 mL IVPB, 150 mg, Intravenous,  Once, 3 of 6 cycles Administration: 150 mg (12/11/2019), 150 mg (01/01/2020) PACLitaxel (TAXOL) 264 mg in sodium chloride 0.9 % 250 mL chemo infusion (> 2m/m2), 135 mg/m2 = 264 mg, Intravenous,  Once, 3 of 6 cycles Dose modification: 101.25 mg/m2 (75 % of original dose 135 mg/m2, Cycle 2, Reason: Dose Not Tolerated) Administration: 264 mg (12/11/2019), 198 mg (01/01/2020)  for chemotherapy treatment.    01/01/2020 Tumor Marker   Patient's tumor was tested for the following markers: CA-125 Results of the tumor marker test revealed 123     REVIEW OF SYSTEMS:   Constitutional: Denies fevers, chills or abnormal weight loss Eyes: Denies blurriness of vision Ears, nose, mouth, throat, and face: Denies mucositis or sore throat Respiratory: Denies cough, dyspnea or wheezes Cardiovascular: Denies palpitation, chest  discomfort or lower extremity swelling Skin: Denies  abnormal skin rashes Lymphatics: Denies new lymphadenopathy or easy bruising Behavioral/Psych: Mood is stable, no new changes  All other systems were reviewed with the patient and are negative.  I have reviewed the past medical history, past surgical history, social history and family history with the patient and they are unchanged from previous Kathryn [name].  ALLERGIES:  is allergic to tegaderm ag mesh [silver].  MEDICATIONS:  Current Outpatient Medications  Medication Sig Dispense Refill  . dexamethasone (DECADRON) 4 MG tablet Take 2 tabs at the night before and 2 tabs the morning of chemotherapy, every 3 weeks, by mouth x 6 cycles 36 tablet 6  . diphenhydrAMINE (BENADRYL) 25 mg capsule Take 100 mg by mouth 2 (two) times a day.    . furosemide (LASIX) 20 MG tablet Take 1 tablet (20 mg total) by mouth daily. 30 tablet 1  . gabapentin (NEURONTIN) 400 MG capsule Take 3 capsules (1,200 mg total) by mouth 3 (three) times daily. 270 capsule 11  . lidocaine-prilocaine (EMLA) cream Apply to affected area once 30 g 3  . losartan-hydrochlorothiazide (HYZAAR) 50-12.5 MG tablet Take 2 tablets by mouth daily.    . methadone (DOLOPHINE) 10 MG tablet Take 2 tablets (20 mg total) by mouth every 12 (twelve) hours. 60 tablet 0  . ondansetron (ZOFRAN) 8 MG tablet Take 8 mg by mouth every 8 (eight) hours as needed.    Marland Kitchen oxyCODONE-acetaminophen (PERCOCET) 10-325 MG tablet Take 1-2 tablets by mouth every 6 (six) hours as needed for pain.    Marland Kitchen prochlorperazine (COMPAZINE) 10 MG tablet TAKE 1 TABLET BY MOUTH EVERY 6 HOURS AS NEEDED FOR NAUSEA OR VOMITING 30 tablet 1   No current facility-administered medications for this visit.   Facility-Administered Medications Ordered in Other Visits  Medication Dose Route Frequency Provider Last Rate Last Admin  . CARBOplatin (PARAPLATIN) 700 mg in sodium chloride 0.9 % 250 mL chemo infusion  700 mg Intravenous Once Alvy Bimler, Nima Kemppainen,  MD      . dexamethasone (DECADRON) 10 mg in sodium chloride 0.9 % 50 mL IVPB  10 mg Intravenous Once Alvy Bimler, Domingos Riggi, MD 204 mL/hr at 01/22/20 1118 10 mg at 01/22/20 1118  . fosaprepitant (EMEND) 150 mg in sodium chloride 0.9 % 145 mL IVPB  150 mg Intravenous Once Alvy Bimler, Vashawn Ekstein, MD      . heparin lock flush 100 unit/mL  500 Units Intracatheter Once PRN Alvy Bimler, Tabitha Tupper, MD      . PACLitaxel (TAXOL) 198 mg in sodium chloride 0.9 % 250 mL chemo infusion (> 27m/m2)  101.25 mg/m2 (Treatment Plan Adjusted) Intravenous Once Avanthika Dehnert, MD      . sodium chloride flush (NS) 0.9 % injection 10 mL  10 mL Intracatheter PRN GAlvy Bimler Drisana Schweickert, MD        PHYSICAL EXAMINATION: ECOG PERFORMANCE STATUS: 1 - Symptomatic but completely ambulatory  Vitals:   01/22/20 1041  BP: (!) 146/94  Pulse: 87  Resp: 18  Temp: 98.3 F (36.8 C)  SpO2: 100%   Filed Weights   01/22/20 1041  Weight: 299 lb 9.6 oz (135.9 kg)    GENERAL:alert, no distress and comfortable SKIN: skin color, texture, turgor are normal, no rashes or significant lesions EYES: normal, Conjunctiva are pink and non-injected, sclera clear OROPHARYNX:no exudate, no erythema and lips, buccal mucosa, and tongue normal  NECK: supple, thyroid normal size, non-tender, without nodularity LYMPH:  no palpable lymphadenopathy in the cervical, axillary or inguinal LUNGS: clear to auscultation and percussion with normal breathing effort HEART: regular rate &  rhythm and no murmurs and no lower extremity edema ABDOMEN:abdomen soft, non-tender and normal bowel sounds Musculoskeletal:no cyanosis of digits and no clubbing  NEURO: alert & oriented x 3 with fluent speech, no focal motor/sensory deficits  LABORATORY DATA:  I have reviewed the data as listed    Component Value Date/Time   NA 140 01/22/2020 1005   K 3.4 (L) 01/22/2020 1005   CL 101 01/22/2020 1005   CO2 28 01/22/2020 1005   GLUCOSE 188 (H) 01/22/2020 1005   BUN 12 01/22/2020 1005   CREATININE 0.78  01/22/2020 1005   CALCIUM 9.0 01/22/2020 1005   PROT 7.1 01/22/2020 1005   ALBUMIN 3.5 01/22/2020 1005   AST 16 01/22/2020 1005   ALT 17 01/22/2020 1005   ALKPHOS 106 01/22/2020 1005   BILITOT 0.5 01/22/2020 1005   GFRNONAA >60 01/22/2020 1005   GFRAA >60 01/22/2020 1005    No results found for: SPEP, UPEP  Lab Results  Component Value Date   WBC 5.6 01/22/2020   NEUTROABS 4.6 01/22/2020   HGB 11.1 (L) 01/22/2020   HCT 33.9 (L) 01/22/2020   MCV 98.8 01/22/2020   PLT 155 01/22/2020      Chemistry      Component Value Date/Time   NA 140 01/22/2020 1005   K 3.4 (L) 01/22/2020 1005   CL 101 01/22/2020 1005   CO2 28 01/22/2020 1005   BUN 12 01/22/2020 1005   CREATININE 0.78 01/22/2020 1005      Component Value Date/Time   CALCIUM 9.0 01/22/2020 1005   ALKPHOS 106 01/22/2020 1005   AST 16 01/22/2020 1005   ALT 17 01/22/2020 1005   BILITOT 0.5 01/22/2020 1005

## 2020-01-23 LAB — CA 125: Cancer Antigen (CA) 125: 109 U/mL — ABNORMAL HIGH (ref 0.0–38.1)

## 2020-01-25 ENCOUNTER — Telehealth: Payer: Self-pay

## 2020-01-25 NOTE — Telephone Encounter (Signed)
RN notified pt of CA 125 results per MD review.

## 2020-02-02 ENCOUNTER — Telehealth: Payer: Self-pay

## 2020-02-02 ENCOUNTER — Other Ambulatory Visit: Payer: Self-pay | Admitting: Hematology and Oncology

## 2020-02-02 NOTE — Telephone Encounter (Signed)
Called and given below message. She verbalized understanding and does need the Rx for Methadone.

## 2020-02-02 NOTE — Telephone Encounter (Signed)
done

## 2020-02-02 NOTE — Telephone Encounter (Signed)
-----   Message from Heath Lark, MD sent at 02/02/2020  9:06 AM EDT ----- Can you call and ask and verify she needs methadone refill? I typically do not refill for computer automated generated request

## 2020-02-08 NOTE — Progress Notes (Signed)

## 2020-02-09 DIAGNOSIS — M961 Postlaminectomy syndrome, not elsewhere classified: Secondary | ICD-10-CM | POA: Diagnosis not present

## 2020-02-09 DIAGNOSIS — Z79891 Long term (current) use of opiate analgesic: Secondary | ICD-10-CM | POA: Diagnosis not present

## 2020-02-09 DIAGNOSIS — G894 Chronic pain syndrome: Secondary | ICD-10-CM | POA: Diagnosis not present

## 2020-02-09 DIAGNOSIS — M5416 Radiculopathy, lumbar region: Secondary | ICD-10-CM | POA: Diagnosis not present

## 2020-02-11 ENCOUNTER — Other Ambulatory Visit: Payer: Self-pay

## 2020-02-11 ENCOUNTER — Ambulatory Visit (HOSPITAL_COMMUNITY)
Admission: RE | Admit: 2020-02-11 | Discharge: 2020-02-11 | Disposition: A | Discharge status: Home / Self Care | Payer: Medicare Other | Source: Ambulatory Visit | Attending: Hematology and Oncology | Admitting: Hematology and Oncology

## 2020-02-11 DIAGNOSIS — C541 Malignant neoplasm of endometrium: Secondary | ICD-10-CM | POA: Insufficient documentation

## 2020-02-11 DIAGNOSIS — C679 Malignant neoplasm of bladder, unspecified: Secondary | ICD-10-CM | POA: Diagnosis not present

## 2020-02-11 MED ORDER — HEPARIN SOD (PORK) LOCK FLUSH 100 UNIT/ML IV SOLN
INTRAVENOUS | Status: AC
  Filled 2020-02-11: qty 5

## 2020-02-11 MED ORDER — HEPARIN SOD (PORK) LOCK FLUSH 100 UNIT/ML IV SOLN
500.0000 [IU] | Freq: Once | INTRAVENOUS | Status: AC
  Administered 2020-02-11: 500 [IU] via INTRAVENOUS

## 2020-02-11 MED ORDER — IOHEXOL 300 MG/ML  SOLN
100.0000 mL | Freq: Once | INTRAMUSCULAR | Status: AC | PRN
  Administered 2020-02-11: 100 mL via INTRAVENOUS

## 2020-02-11 MED ORDER — SODIUM CHLORIDE (PF) 0.9 % IJ SOLN
INTRAMUSCULAR | Status: AC
  Filled 2020-02-11: qty 50

## 2020-02-11 MED FILL — Fosaprepitant Dimeglumine For IV Infusion 150 MG (Base Eq): INTRAVENOUS | Qty: 5 | Status: AC

## 2020-02-12 ENCOUNTER — Other Ambulatory Visit: Payer: Self-pay

## 2020-02-12 ENCOUNTER — Inpatient Hospital Stay: Payer: Medicare Other

## 2020-02-12 ENCOUNTER — Telehealth: Payer: Self-pay | Admitting: Pharmacist

## 2020-02-12 ENCOUNTER — Inpatient Hospital Stay: Payer: Medicare Other | Admitting: Hematology and Oncology

## 2020-02-12 ENCOUNTER — Telehealth: Payer: Self-pay | Admitting: Pharmacy Technician

## 2020-02-12 ENCOUNTER — Encounter: Payer: Self-pay | Admitting: Hematology and Oncology

## 2020-02-12 VITALS — BP 149/74 | HR 81 | Temp 98.8°F | Resp 18 | Ht 63.0 in | Wt 295.2 lb

## 2020-02-12 DIAGNOSIS — K5909 Other constipation: Secondary | ICD-10-CM

## 2020-02-12 DIAGNOSIS — G629 Polyneuropathy, unspecified: Secondary | ICD-10-CM | POA: Diagnosis not present

## 2020-02-12 DIAGNOSIS — G8929 Other chronic pain: Secondary | ICD-10-CM | POA: Diagnosis not present

## 2020-02-12 DIAGNOSIS — D61818 Other pancytopenia: Secondary | ICD-10-CM

## 2020-02-12 DIAGNOSIS — C541 Malignant neoplasm of endometrium: Secondary | ICD-10-CM | POA: Diagnosis not present

## 2020-02-12 DIAGNOSIS — M549 Dorsalgia, unspecified: Secondary | ICD-10-CM | POA: Diagnosis not present

## 2020-02-12 DIAGNOSIS — Z7189 Other specified counseling: Secondary | ICD-10-CM | POA: Diagnosis not present

## 2020-02-12 DIAGNOSIS — Z5111 Encounter for antineoplastic chemotherapy: Secondary | ICD-10-CM | POA: Diagnosis not present

## 2020-02-12 DIAGNOSIS — Z9221 Personal history of antineoplastic chemotherapy: Secondary | ICD-10-CM | POA: Diagnosis not present

## 2020-02-12 DIAGNOSIS — I10 Essential (primary) hypertension: Secondary | ICD-10-CM | POA: Diagnosis not present

## 2020-02-12 LAB — CMP (CANCER CENTER ONLY)
ALT: 16 U/L (ref 0–44)
AST: 17 U/L (ref 15–41)
Albumin: 3.5 g/dL (ref 3.5–5.0)
Alkaline Phosphatase: 105 U/L (ref 38–126)
Anion gap: 9 (ref 5–15)
BUN: 13 mg/dL (ref 8–23)
CO2: 31 mmol/L (ref 22–32)
Calcium: 8.8 mg/dL — ABNORMAL LOW (ref 8.9–10.3)
Chloride: 99 mmol/L (ref 98–111)
Creatinine: 0.73 mg/dL (ref 0.44–1.00)
GFR, Est AFR Am: >60 mL/min (ref >60)
GFR, Estimated: >60 mL/min (ref >60)
Glucose, Bld: 174 mg/dL — ABNORMAL HIGH (ref 70–99)
Potassium: 3.2 mmol/L — ABNORMAL LOW (ref 3.5–5.1)
Sodium: 139 mmol/L (ref 135–145)
Total Bilirubin: 0.5 mg/dL (ref 0.3–1.2)
Total Protein: 6.9 g/dL (ref 6.5–8.1)

## 2020-02-12 LAB — CBC WITH DIFFERENTIAL (CANCER CENTER ONLY)
Abs Immature Granulocytes: 0.02 10*3/uL (ref 0.00–0.07)
Basophils Absolute: 0 10*3/uL (ref 0.0–0.1)
Basophils Relative: 0 %
Eosinophils Absolute: 0 10*3/uL (ref 0.0–0.5)
Eosinophils Relative: 0 %
HCT: 31.1 % — ABNORMAL LOW (ref 36.0–46.0)
Hemoglobin: 10.3 g/dL — ABNORMAL LOW (ref 12.0–15.0)
Immature Granulocytes: 1 %
Lymphocytes Relative: 17 %
Lymphs Abs: 0.7 10*3/uL (ref 0.7–4.0)
MCH: 33.4 pg (ref 26.0–34.0)
MCHC: 33.1 g/dL (ref 30.0–36.0)
MCV: 101 fL — ABNORMAL HIGH (ref 80.0–100.0)
Monocytes Absolute: 0.4 10*3/uL (ref 0.1–1.0)
Monocytes Relative: 8 %
Neutro Abs: 3.3 10*3/uL (ref 1.7–7.7)
Neutrophils Relative %: 74 %
Platelet Count: 84 10*3/uL — ABNORMAL LOW (ref 150–400)
RBC: 3.08 MIL/uL — ABNORMAL LOW (ref 3.87–5.11)
RDW: 19.8 % — ABNORMAL HIGH (ref 11.5–15.5)
WBC Count: 4.4 10*3/uL (ref 4.0–10.5)
nRBC: 0 % (ref 0.0–0.2)

## 2020-02-12 MED ORDER — DIPHENHYDRAMINE HCL 50 MG/ML IJ SOLN
INTRAMUSCULAR | Status: AC
  Filled 2020-02-12: qty 1

## 2020-02-12 MED ORDER — SODIUM CHLORIDE 0.9% FLUSH
10.0000 mL | Freq: Once | INTRAVENOUS | Status: AC
  Administered 2020-02-12: 10 mL
  Filled 2020-02-12: qty 10

## 2020-02-12 MED ORDER — PALONOSETRON HCL INJECTION 0.25 MG/5ML
INTRAVENOUS | Status: AC
  Filled 2020-02-12: qty 5

## 2020-02-12 MED ORDER — FAMOTIDINE IN NACL 20-0.9 MG/50ML-% IV SOLN
INTRAVENOUS | Status: AC
  Filled 2020-02-12: qty 50

## 2020-02-12 MED ORDER — LENVATINIB (20 MG DAILY DOSE) 2 X 10 MG PO CPPK: 20.0000 mg | ORAL_CAPSULE | Freq: Every day | ORAL | 11 refills | Status: DC

## 2020-02-12 MED ORDER — RIVAROXABAN 20 MG PO TABS: 20.0000 mg | ORAL_TABLET | Freq: Every day | ORAL | 9 refills | Status: DC

## 2020-02-12 MED FILL — XARELTO 20 MG TABLET: 20 | 30 days supply | Qty: 30 | Fill #0

## 2020-02-12 NOTE — Progress Notes (Signed)
[facility] OFFICE PROGRESS NOTE  Patient Care Team: Ronita Hipps, MD as PCP - General (Family Medicine) Nicholaus Bloom, MD (Anesthesiology)  ASSESSMENT & PLAN:  Endometrial cancer [facility]) Unfortunately, despite improvement in her tumor marker levels, CT imaging showed disease progression I will cancel her treatment today She has very mild pancytopenia which I expect to improvement next week She is mildly symptomatic with recent onset of worsening constipation We discussed aggressive laxative therapy I reviewed the guidelines with the patient and family We discussed chemotherapy options including single agent topotecan, liposomal doxorubicin, combination treatment of pembrolizumab plus lenvatinib versus antiestrogen therapy The risks, benefits, side effects of each options were discussed in great detail and ultimately, she choose to proceed with combination treatment of pembrolizumab plus lenvatinib I am hopeful that we can get her started on treatment next week I will get insurance prior authorization to try to get lenvatinib filled by next week  We reviewed the current guidelines Goal is palliative I recommend switching her treatment with Lenvima and pembrolizumab.   The combination was approved following review conducted under [employer], an intiative of the [place] which provides a framework for concurrent submission and review of oncology drugs among international partners. The FDA approved the combination with the [place] Therapeutic Goods Administration and [facility] [place].   Efficacy of the drugs together was investigated in Study 111/KEYNOTE-146 ([id number]), a single-arm, multicenter, open-label, multi-cohort trial that enrolled 108 patients with metastatic endometrial cancer that had progressed following at least one prior systemic therapy in any setting.  Patients took 20 mg of lenvatinib orally once daily in combination with 200 mg  of pembrolizumab administered intravenously every 3 weeks until unacceptable toxicity or disease progression.  Among the 108 patients, 94 had tumors that were not MSI-H or dMMR, 11 had tumors that were MSI-H or dMMR, and in 3 patients the tumor MSI-H or dMMR status was not known.  Results  The major efficacy outcome measures were objective response rate (ORR) and duration of response (DOR) by independent radiologic review committee using RECIST 1.1. The ORR in the 94 patients whose tumors were not MSI-H or dMMR was 38.3% (95% CI: 29%, 49%) with 10 complete responses (10.6%) and 26 partial responses (27.7%). Median DOR was not reached at the time of data cutoff and 25 patients (69% of responders) had response durations ?6 months.  In another updated publication published on JCO: DOI: 10.1200/JCO.19.02627 Journal of Clinical Oncology, Published online December 26, 2018. Lenvatinib Plus Pembrolizumab in Patients With Advanced Endometrial Cancer Abstract PURPOSE  Patients with advanced endometrial carcinoma have limited treatment options. We report final primary efficacy analysis results for a patient cohort with advanced endometrial carcinoma receiving lenvatinib plus pembrolizumab in an ongoing phase Ib/II study of selected solid tumors. METHODS  Patients took lenvatinib 20 mg once daily orally plus pembrolizumab 200 mg intravenously once every 3 weeks, in 3-week cycles. The primary end point was objective response rate (ORR) at 24 weeks ([id number]); secondary efficacy end points included duration of response (DOR), progression-free survival (PFS), and overall survival (OS). Tumor assessments were evaluated by investigators per immune-related RECIST.  RESULTS  At data cutoff, 108 patients with previously treated endometrial carcinoma were enrolled, with a median follow-up of 18.7 months. The HGDJM42 was 38.0% (95% CI, 28.8% to 47.8%). Among subgroups, the ASTMH96 (95% CI) was 63.6% (30.8% to 89.1%) in patients  with microsatellite instability (MSI)-high tumors (n = 11) and 36.2% (26.5% to 46.7%) in patients with  microsatellite-stable tumors (n = 94). For previously treated patients, regardless of tumor MSI status, the median DOR was 21.2 months (95% CI, 7.6 months to not estimable), median PFS was 7.4 months (95% CI, 5.3 to 8.7 months), and median OS was 16.7 months (15.0 months to not estimable). Grade 3 or 4 treatment-related adverse events occurred in 83/124 (66.9%) patients. CONCLUSION  Lenvatinib plus pembrolizumab showed promising antitumor activity in patients with advanced endometrial carcinoma who have experienced disease progression after prior systemic therapy, regardless of tumor MSI status. The combination therapy had a manageable toxicity profile. The most common adverse reactions for endometrial cancer were fatigue, hypertension, musculoskeletal pain, diarrhea, decreased appetite, hypothyroidism, nausea, stomatitis, vomiting, decreased weight, abdominal pain, headache, constipation, urinary tract infection, dysphonia, hemorrhagic events, hypomagnesemia, palmar-plantar erythrodysesthesia, dyspnea, cough, and rash.   The most common adverse reactions for endometrial cancer were fatigue, hypertension, musculoskeletal pain, diarrhea, decreased appetite, hypothyroidism, nausea, stomatitis, vomiting, decreased weight, abdominal pain, headache, constipation, urinary tract infection, dysphonia, hemorrhagic events, hypomagnesemia, palmar-plantar erythrodysesthesia, dyspnea, cough, and rash.   After much discussion, the patient would like to proceed with the plan of care. 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 We have extensive discussion about laxative therapy  Chronic back pain greater than 3 months duration I will take over her pain management from now on with permission from her pain specialist She is aware of narcotic refill policy  Essential hypertension Her blood pressure was recently suboptimally controlled It is likely to get worse with lenvatinib I recommend she start checking her blood pressure twice a day and I will see her next week and will take over blood pressure management as well  Goals of care, counseling/discussion She understood goals of care is palliative in nature We discussed the importance of advanced directives and living will We discussed prognosis with or without treatment   Orders Placed This Encounter  Procedures  . CBC with Differential (Cancer Center Only)    Standing Status:   Standing    Number of Occurrences:   20    Standing Expiration Date:   02/11/2021  . CMP ([facility] only)    Standing Status:   Standing    Number of Occurrences:   20    Standing Expiration Date:   02/11/2021  . T4    Standing Status:   Standing    Number of Occurrences:   20    Standing Expiration Date:   02/11/2021  . TSH    Standing Status:   Standing    Number of Occurrences:   20    Standing Expiration Date:   02/11/2021  . Total Protein, Urine dipstick    Standing Status:   Standing    Number of Occurrences:   20    Standing Expiration Date:   02/11/2021    All questions were answered. The patient knows to call the clinic with any problems, questions or concerns. The total time spent in the  appointment was 80 minutes encounter with patients including review of chart and various tests results,  discussions about plan of care and coordination of care plan   Heath Lark, MD 02/12/2020 11:47 AM  INTERVAL HISTORY: Please see below for problem oriented charting. She returns with her family for further follow-up and review of imaging studies Since last time I saw her, she noticed some mild worsening constipation She has met with her pain specialist who felt it is reasonable for me to take over her pain management She noticed her blood pressure at home has been elevated.  She has increased the blood pressure medication at home Denies recent nausea No recent infection, fever or chills No recent sensation of bloating The patient denies any recent signs or symptoms of bleeding such as spontaneous epistaxis, hematuria or hematochezia.  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 A Pap was done 09/02/18 showign ASC-H. She was referred onto GYN and seen by Dr. Paula Compton    09/09/2018 Procedure   She underwent endometrial biopsy   09/12/2018 Pathology Results   Endometrial biopsy positive for adenocarcinoma   09/22/2018 Imaging   Ct abdomen and pelvis showed: Diffuse endometrial thickening, consistent with primary endometrial carcinoma.  Pelvic and abdominal retroperitoneal and retrocrural lymphadenopathy, consistent with metastatic disease.  Moderate hepatic steatosis.   09/25/2018 Cancer Staging   Staging form: Corpus Uteri - Carcinoma and Carcinosarcoma, AJCC 8th Edition - Pathologic: Stage IIIC2 (pT2, pN2, cM0) - Signed by Heath Lark, MD on 02/23/2019   10/01/2018 Procedure   Successful placement of a right IJ approach Power Port with ultrasound and fluoroscopic guidance. The catheter is ready for use.   10/02/2018 Tumor Marker    Patient's tumor was tested for the following markers: CA-125 Results of the tumor marker test revealed 578   10/03/2018 - 01/16/2019 Chemotherapy   The patient had carboplatin and Taxol with dose adjustment due to neuropathy x 6 cycles   10/24/2018 Tumor Marker   Patient's tumor was tested for the following markers: CA-125 Results of the tumor marker test revealed 296    Genetic Testing   Patient has genetic testing done for MSI/MMR. Results revealed MSI stable and MMR normal on Access[employer] [id number].   11/14/2018 Tumor Marker   Patient's tumor was tested for the following markers: CA-125 Results of the tumor marker test revealed 81   11/27/2018 Imaging   1. Significant decrease in abdominal retroperitoneal and bilateral iliac lymphadenopathy since previous study. 2. Significant decrease in abnormal endometrial soft tissue density since prior study. 3. No new or progressive metastatic disease identified.   12/05/2018 Tumor Marker   Patient's tumor was tested for the following markers: CA-125 Results of the tumor marker test revealed 46.3   12/26/2018 Tumor Marker   Patient's tumor was tested for the following markers: CA-125 Results of the tumor marker test revealed 38.2   01/16/2019 Tumor Marker   Patient's tumor was tested for the following markers: CA-125 Results of the tumor marker test revealed 30.8   02/03/2019 Imaging   1. Numerous retroperitoneal, iliac, and pelvic lymph nodes are stable or slightly decreased in size compared to immediate prior examination dated 11/27/2018 and significantly decreased in size compared to exam dated 09/20/2018.  2.  Unchanged thickening of the  endometrium.  3. Other chronic, incidental, and postoperative findings as detailed above.   02/12/2019 Imaging   1. Uterus +/- tubes/ovaries, neoplastic, cervix, bilateral fallopian tubes and ovaries - MIXED HIGH-GRADE SEROUS AND ENDOMETRIAL ADENOCARCINOMA, 6.5 CM. SEE NOTE - CARCINOMA INVOLVES ENTIRE  ENDOMETRIUM, ANTERIOR AND POSTERIOR LOWER UTERINE SEGMENTS AND ANTERIOR AND POSTERIOR CERVIX. - CARCINOMA INVADES FOR A DEPTH OF 1.9 CM WHERE MYOMETRIAL THICKNESS IS 2.0 CM (GREATER THAN 50% MYOMETRIAL INVASION) - CARCINOMA IS 1 MM FROM THE CERVICAL MARGIN - NEGATIVE FOR LYMPHOVASCULAR OR PERINEURAL INVASION - BILATERAL UNREMARKABLE FALLOPIAN TUBES AND OVARIES, NEGATIVE FOR CARCINOMA - SEE ONCOLOGY TABLE 2. Lymph node, biopsy, right pelvic - METASTATIC CARCINOMA TO ONE OF TWO LYMPH NODES (1/2) Microscopic Comment 1. (v4.1.0.0) UTERUS, CARCINOMA OR CARCINOSARCOMA Procedure: Total hysterectomy with bilateral salpingo-oophorectomy Histologic type: Mixed high-grade serous and endometrioid adenocarcinoma Histologic Grade: NA Myometrial invasion: Present Depth of invasion: 19 mm Myometrial thickness: 20 mm Uterine Serosa Involvement: Not identified Cervical stromal involvement: Present Extent of involvement of other organs: NA Lymphovascular invasion: Not identified Regional Lymph Nodes: Examined: 0 Sentinel 2 Non-sentinel 2 Total Lymph nodes with metastasis: 1 Isolated tumor cells (< 0.2 mm): 0 Micrometastasis: (> 0.2 mm and < 2.0 mm): 1 Macrometastasis: (> 2.0 mm): 0 Extracapsular extension: Not identified Tumor block for ancillary studies: 3F MMR / MSI testing: Pending Pathologic Stage Classification (pTNM, AJCC 8th edition): pT2, pN54m FIGO Stage: IIIC1 Representative Tumor Block: 1A, 1C (v4.1.0.0) Diagnosis Note 1. Immunohistochemical stains for p16, p53, ER and Ki-67 show a pattern of staining consistent with mixed high-grade serous and endometrial adenocarcinoma. Dr. KLyndon Codehas reviewed this case and concurs with the above interpretation. A molecular study for microsatellite instability (MSI) and immunostains for MMR-related proteins are pending and will be reported in an addendum.   02/12/2019 Surgery   Pre-operative Diagnosis: endometrial cancer stage IIIC2, s/p neoadjuvant  chemotherapy, extreme obesity (BMI 53kg/m2)  Operation: Robotic-assisted laparoscopic total hysterectomy with bilateral salpingoophorectomy, right pelvic lymphadenectomy. 22 modifier for extreme obesity  Surgeon: RDonaciano Eva Assistant Surgeon: LLahoma CrockerMD  Operative Findings:  : extreme obesity, BMI 53kg/m2, with significant retroperitoneal and intraperitoneal adiposity. Obesity necessitated an additional hour of operating time to create safe exposure. Obesity required additional personnel in the operating room for positioning and retraction. Severe obesity substantially increased the complexity of the procedure. 8cm uterus, grossly normal, normal appearing cervix. Retroperitoneal fibrosis consistent with nodal positivity. Clinically suspicious right pelvic lymph node. Unable to completely visualize retroperitoneal nodes due to extreme obesity.    02/12/2019 Pathology Results   IMMUNOHISTOCHEMICAL AND MORPHOMETRIC ANALYSIS PERFORMED MANUALLY The tumor cells are POSITIVE for Her2 (3+). Estrogen Receptor: 70%, POSITIVE, STRONG STAINING INTENSITY Progesterone Receptor: 70%, POSITIVE, STRONG STAINING INTENSITY Proliferation Marker Ki67: 20% REFERENCE RANGE ESTROGEN RECEPTOR NEGATIVE 0% POSITIVE =>1% REFERENCE RANGE PROGESTERONE RECEPTOR NEGATIVE 0% POSITIVE =>1% All controls stained appropriately  1. Uterus +/- tubes/ovaries, neoplastic, cervix, bilateral fallopian tubes and ovaries - MIXED HIGH-GRADE SEROUS AND ENDOMETRIAL ADENOCARCINOMA, 6.5 CM. SEE NOTE - CARCINOMA INVOLVES ENTIRE ENDOMETRIUM, ANTERIOR AND POSTERIOR LOWER UTERINE SEGMENTS AND ANTERIOR AND POSTERIOR CERVIX. - CARCINOMA INVADES FOR A DEPTH OF 1.9 CM WHERE MYOMETRIAL THICKNESS IS 2.0 CM (GREATER THAN 50% MYOMETRIAL INVASION) - CARCINOMA IS 1 MM FROM THE CERVICAL MARGIN - NEGATIVE FOR LYMPHOVASCULAR OR PERINEURAL INVASION - BILATERAL UNREMARKABLE FALLOPIAN TUBES AND OVARIES, NEGATIVE FOR CARCINOMA -  SEE ONCOLOGY TABLE 2. Lymph node, biopsy, right pelvic - METASTATIC CARCINOMA TO ONE OF TWO LYMPH NODES (1/2) Microscopic Comment  1. (v4.1.0.0) UTERUS, CARCINOMA OR CARCINOSARCOMA Procedure: Total hysterectomy with bilateral salpingo-oophorectomy Histologic type: Mixed high-grade serous and endometrioid adenocarcinoma Histologic Grade: NA Myometrial invasion: Present Depth of invasion: 19 mm Myometrial thickness: 20 mm Uterine Serosa Involvement: Not identified Cervical stromal involvement: Present Extent of involvement of other organs: NA Lymphovascular invasion: Not identified Regional Lymph Nodes: Examined: 0 Sentinel 2 Non-sentinel 2 Total Lymph nodes with metastasis: 1 Isolated tumor cells (< 0.2 mm): 0 Micrometastasis: (> 0.2 mm and < 2.0 mm): 1 Macrometastasis: (> 2.0 mm): 0 Extracapsular extension: Not identified Tumor block for ancillary studies: 67F MMR / MSI testing: Pending Pathologic Stage Classification (pTNM, AJCC 8th edition): pT2, pN84m FIGO Stage: IIIC1 Representative Tumor Block: 1A, 1C   03/17/2019 Echocardiogram   1. The left ventricle has normal systolic function, with an ejection fraction of 55-60%. The cavity size was normal. There is moderate concentric left ventricular hypertrophy. Left ventricular diastolic Doppler parameters are consistent with impaired relaxation.  2. The right ventricle has normal systolic function. The cavity was normal. There is no increase in right ventricular wall thickness.  3. GLS: -15.5%, no prior study available for comparison.   03/17/2019 Tumor Marker   Patient's tumor was tested for the following markers: CA-125 Results of the tumor marker test revealed 43   03/20/2019 -  Chemotherapy   The patient had trastuzumab for chemotherapy treatment.     04/30/2019 Tumor Marker   Patient's tumor was tested for the following markers:CA-125 Results of the tumor marker test revealed 29   05/20/2019 Imaging   1. No evidence of  metastatic disease. 2. Small pelvic and left paracolic gutter ascites, new. 3. Aortic atherosclerosis (ICD10-170.0). Coronary artery calcification.     05/26/2019 Echocardiogram      1. The left ventricle has normal systolic function with an ejection fraction of 60-65%. The cavity size was normal. There is mildly increased left ventricular wall thickness. Left ventricular diastolic parameters were normal.  2. GLS -16.4% (underestimated due to poor endocardial tracking).  3. The right ventricle has normal systolic function. The cavity was normal. There is no increase in right ventricular wall thickness.  4. Mild calcification of the mitral valve leaflet.  5. Mild calcification of the aortic valve. No stenosis of the aortic valve.  6. The aorta is normal in size and structure.   09/02/2019 Imaging   No evidence of recurrent or metastatic carcinoma within the abdomen or pelvis.   Colonic diverticulosis. No radiographic evidence of diverticulitis.   Increased large stool burden noted; recommend clinical correlation for possible constipation.   09/02/2019 Echocardiogram   1. Left ventricular ejection fraction, by visual estimation, is 60 to 65%. The left ventricle has normal function. There is no left ventricular hypertrophy.  2. Global right ventricle has normal systolic function.The right ventricular size is normal. No increase in right ventricular wall thickness.  3. Left atrial size was normal.  4. Right atrial size was normal.  5. The mitral valve is normal in structure. No evidence of mitral valve regurgitation.  6. The tricuspid valve is normal in structure. Tricuspid valve regurgitation is trivial.  7. The aortic valve is normal in structure. Aortic valve regurgitation is not visualized.  8. The pulmonic valve was normal in structure. Pulmonic valve regurgitation is trivial.  9. Mildly elevated pulmonary artery systolic pressure. 10. The average left ventricular global longitudinal  strain is -26.9 %.   09/03/2019 Tumor Marker   Patient's tumor was tested for the following markers: CA-125 Results of  the tumor marker test revealed 13.5   11/05/2019 Tumor Marker   Patient's tumor was tested for the following markers: CA-125 Results of the tumor marker test revealed 27.   11/25/2019 Imaging   1. New omental nodules suspicious for early peritoneal spread of malignancy. 2. New thickening along the right paracolic gutter concerning for potential tumor. 3. Other imaging findings of potential clinical significance: Mild cardiomegaly. Stable lateral segment left hepatic lobe hypodense lesions, likely cysts. Multilevel posterior decompression with multilevel foraminal impingement. 4. 4 mm left lower lobe pulmonary nodule not readily appreciable on the prior exam, could be inflammatory or neoplastic. Surveillance suggested. 5. Other imaging findings of potential clinical significance: Mild cardiomegaly. Suspected hepatic cysts. Multilevel lumbar impingement.   Aortic Atherosclerosis (ICD10-I70.0).   11/26/2019 Tumor Marker   Patient's tumor was tested for the following markers: CA-125 Results of the tumor marker test revealed 129   12/11/2019 -  Chemotherapy   The patient had carboplatin and taxol for chemotherapy treatment.     01/01/2020 Tumor Marker   Patient's tumor was tested for the following markers: CA-125 Results of the tumor marker test revealed 123   01/22/2020 Tumor Marker   Patient's tumor was tested for the following markers: CA-125 Results of the tumor marker test revealed 109.   02/11/2020 Imaging   1. Interval worsening of disease as evidenced by new and enlarging peritoneal and omental nodularity. 2. Slight enlargement of retroperitoneal lymph nodes. 3. New small area of low attenuation along the medial margin of the spleen, potentially small capsular implant not seen on the previous study.   Aortic Atherosclerosis (ICD10-I70.0).     02/19/2020 -   Chemotherapy   The patient had pembrolizumab (KEYTRUDA) 200 mg in sodium chloride 0.9 % 50 mL chemo infusion, 200 mg, Intravenous, Once, 0 of 6 cycles  for chemotherapy treatment.      REVIEW OF SYSTEMS:   Constitutional: Denies fevers, chills or abnormal weight loss Eyes: Denies blurriness of vision Ears, nose, mouth, throat, and face: Denies mucositis or sore throat Respiratory: Denies cough, dyspnea or wheezes Cardiovascular: Denies palpitation, chest discomfort or lower extremity swelling Skin: Denies abnormal skin rashes Lymphatics: Denies new lymphadenopathy or easy bruising Neurological:Denies numbness, tingling or new weaknesses Behavioral/Psych: Mood is stable, no new changes  All other systems were reviewed with the patient and are negative.  I have reviewed the past medical history, past surgical history, social history and family history with the patient and they are unchanged from previous note.  ALLERGIES:  is allergic to tegaderm ag mesh [silver].  MEDICATIONS:  Current Outpatient Medications  Medication Sig Dispense Refill  . loratadine (CLARITIN) 10 MG tablet Take 10 mg by mouth daily.    . ranitidine (ZANTAC) 150 MG capsule Take 150 mg by mouth 2 (two) times daily.    . diphenhydrAMINE (BENADRYL) 25 mg capsule Take 100 mg by mouth 2 (two) times a day.    . furosemide (LASIX) 20 MG tablet Take 1 tablet (20 mg total) by mouth daily. 30 tablet 1  . gabapentin (NEURONTIN) 400 MG capsule Take 3 capsules (1,200 mg total) by mouth 3 (three) times daily. 270 capsule 11  . lenvatinib 20 mg daily dose (LENVIMA) 2 x 10 MG capsule Take 2 capsules (20 mg total) by mouth daily. 60 capsule 11  . lidocaine-prilocaine (EMLA) cream Apply to affected area once 30 g 3  . losartan-hydrochlorothiazide (HYZAAR) 50-12.5 MG tablet Take 2 tablets by mouth daily.    . methadone (DOLOPHINE) 10  MG tablet Take 2 tablets (20 mg total) by mouth every 12 (twelve) hours. 90 tablet 0  . ondansetron  (ZOFRAN) 8 MG tablet Take 8 mg by mouth every 8 (eight) hours as needed.    Marland Kitchen oxyCODONE-acetaminophen (PERCOCET) 10-325 MG tablet Take 1-2 tablets by mouth every 6 (six) hours as needed for pain.    Marland Kitchen prochlorperazine (COMPAZINE) 10 MG tablet TAKE 1 TABLET BY MOUTH EVERY 6 HOURS AS NEEDED FOR NAUSEA OR VOMITING 30 tablet 1  . rivaroxaban (XARELTO) 20 MG TABS tablet Take 1 tablet (20 mg total) by mouth daily with supper. 30 tablet 9   No current facility-administered medications for this visit.    PHYSICAL EXAMINATION: ECOG PERFORMANCE STATUS: 1 - Symptomatic but completely ambulatory  Vitals:   02/12/20 0908  BP: (!) 149/74  Pulse: 81  Resp: 18  Temp: 98.8 F (37.1 C)  SpO2: 100%   Filed Weights   02/12/20 0908  Weight: 295 lb 3.2 oz (133.9 kg)    GENERAL:alert, no distress and comfortable NEURO: alert & oriented x 3 with fluent speech, no focal motor/sensory deficits  LABORATORY DATA:  I have reviewed the data as listed    Component Value Date/Time   NA 139 02/12/2020 0827   K 3.2 (L) 02/12/2020 0827   CL 99 02/12/2020 0827   CO2 31 02/12/2020 0827   GLUCOSE 174 (H) 02/12/2020 0827   BUN 13 02/12/2020 0827   CREATININE 0.73 02/12/2020 0827   CALCIUM 8.8 (L) 02/12/2020 0827   PROT 6.9 02/12/2020 0827   ALBUMIN 3.5 02/12/2020 0827   AST 17 02/12/2020 0827   ALT 16 02/12/2020 0827   ALKPHOS 105 02/12/2020 0827   BILITOT 0.5 02/12/2020 0827   GFRNONAA >60 02/12/2020 0827   GFRAA >60 02/12/2020 0827    No results found for: SPEP, UPEP  Lab Results  Component Value Date   WBC 4.4 02/12/2020   NEUTROABS 3.3 02/12/2020   HGB 10.3 (L) 02/12/2020   HCT 31.1 (L) 02/12/2020   MCV 101.0 (H) 02/12/2020   PLT 84 (L) 02/12/2020      Chemistry      Component Value Date/Time   NA 139 02/12/2020 0827   K 3.2 (L) 02/12/2020 0827   CL 99 02/12/2020 0827   CO2 31 02/12/2020 0827   BUN 13 02/12/2020 0827   CREATININE 0.73 02/12/2020 0827      Component Value  Date/Time   CALCIUM 8.8 (L) 02/12/2020 0827   ALKPHOS 105 02/12/2020 0827   AST 17 02/12/2020 0827   ALT 16 02/12/2020 0827   BILITOT 0.5 02/12/2020 0827       RADIOGRAPHIC STUDIES: I have reviewed multiple CT imaging with the patient and family I have personally reviewed the radiological images as listed and agreed with the findings in the report. 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 Post hysterectomy. On systemic therapy. EXAM: CT ABDOMEN AND PELVIS WITH CONTRAST TECHNIQUE: Multidetector CT imaging of the abdomen and pelvis was performed using the standard protocol following bolus administration of intravenous contrast. CONTRAST:  174m OMNIPAQUE IOHEXOL 300 MG/ML  SOLN COMPARISON:  11/25/2019 FINDINGS: Lower chest: Basilar atelectasis without effusion or consolidation. Hepatobiliary: Stable mild post cholecystectomy biliary ductal distension. Lobular hepatic contours, unchanged from prior exam. No focal, suspicious hepatic lesion. Nodularity along the RIGHT hepatic margin is however new, in more since pouch measuring approximately 2.6 cm greatest length an  approximately 8 mm greatest thickness. This is associated with diffuse peritoneal nodularity which is worsened since the previous exam, see below. Pancreas: Pancreas is normal without focal lesion, inflammation or ductal dilation. Spleen: Spleen with small area of low attenuation along the medial margin of the spleen, potentially small capsular implant not seen on the previous study (image 32, series 2) approximately 5 mm greatest thickness. Adrenals/Urinary Tract: Adrenal glands are normal. Kidneys with smooth renal contours, no hydronephrosis. Urinary bladder is normal. Stomach/Bowel: No sign of bowel obstruction or acute bowel process. Vascular/Lymphatic: Retroperitoneal lymph nodes with slight enlarged, largest a preaortic lymph node in the  retroperitoneum (image 39, series 2) measuring approximately 9 mm, previously 6 mm. Atheromatous plaque of the abdominal aorta tracking into branch vessels. Small lymph nodes in the pelvis Reproductive: Post hysterectomy. Nodule at the RIGHT vaginal apex 2.2 x 1.7 cm was not as discrete on the previous study and measured approximately 7 mm in short axis. Other: Extensive nodularity of the anterior peritoneum and omentum. Nodule in the anterior abdominal cavity 3 x 1.5 cm (image 55, series 2) no nodules greater than a cm in this location on the previous imaging study. Diffusely nodular peritoneum and omentum. A second example of this process is seen on image 47 of series 2 measuring 16 mm. RIGHT pericolic nodularity (image 48, series 2 1.9 x 1.1 cm just posterior to the RIGHT colon. Musculoskeletal: No destructive bone finding. Signs of lumbar laminectomy and fusion similar to the previous exam. This spans L3 through L5. IMPRESSION: 1. Interval worsening of disease as evidenced by new and enlarging peritoneal and omental nodularity. 2. Slight enlargement of retroperitoneal lymph nodes. 3. New small area of low attenuation along the medial margin of the spleen, potentially small capsular implant not seen on the previous study. Aortic Atherosclerosis (ICD10-I70.0). Electronically Signed   By: Zetta Bills M.D.   On: 02/11/2020 10:24

## 2020-02-12 NOTE — Telephone Encounter (Signed)
Oral Oncology Patient Advocate Encounter   Received notification from [employer] that prior authorization for Lenvima is required.   PA submitted on CoverMyMeds Key [id number] Status is pending   Oral Oncology Clinic will continue to follow.  [place] Patient [place] Phone [phone] Fax [phone] 02/12/2020 1:46 PM

## 2020-02-12 NOTE — Assessment & Plan Note (Signed)
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

## 2020-02-12 NOTE — Assessment & Plan Note (Signed)
Her blood pressure was recently suboptimally controlled It is likely to get worse with lenvatinib I recommend she start checking her blood pressure twice a day and I will see her next week and will take over blood pressure management as well

## 2020-02-12 NOTE — Progress Notes (Signed)
DISCONTINUE ON PATHWAY REGIMEN - Uterine     Cycle 1: A cycle is 21 days:     Trastuzumab-xxxx      Paclitaxel      Carboplatin    Cycles 2 through 6: A cycle is every 21 days:     Trastuzumab-xxxx      Paclitaxel      Carboplatin    Cycles 7 and beyond: A cycle is every 21 days:     Trastuzumab-xxxx   **Always confirm dose/schedule in your pharmacy ordering system**  REASON: Disease Progression PRIOR TREATMENT: [id number]: Carboplatin AUC=5 + Paclitaxel 175 mg/m2 + Trastuzumab 8/6 mg/kg IV q21 Days x 6 Cycles, Followed By Trastuzumab Maintenance (6 mg/kg IV q21 Days) Until Progression or Unacceptable Toxicity TREATMENT RESPONSE: Progressive Disease (PD)  START OFF PATHWAY REGIMEN - Uterine   [id number]:Lenvatinib 20 mg PO Daily D1-21 + Pembrolizumab 200 mg IV D1 q21 Days:   A cycle is every 21 days:     Lenvatinib      Pembrolizumab   **Always confirm dose/schedule in your pharmacy ordering system**  Patient Characteristics: Serous Carcinoma, Recurrent/Progressive Disease, Third Line and Beyond, HER2 Positive Histology: Serous Carcinoma Therapeutic Status: Recurrent or Progressive Disease Line of Therapy: Third Line and Beyond HER2 Status: Positive Intent of Therapy: Non-Curative / Palliative Intent, Discussed with Patient

## 2020-02-12 NOTE — Assessment & Plan Note (Signed)
This is due to side effects of recent treatment She is not symptomatic Observe for now

## 2020-02-12 NOTE — Assessment & Plan Note (Signed)
She understood goals of care is palliative in nature We discussed the importance of advanced directives and living will We discussed prognosis with or without treatment

## 2020-02-12 NOTE — Telephone Encounter (Addendum)
Oral Oncology Pharmacist Encounter  Received new prescription for Lenvima (lenvatinib) for the treatment of endometrial cancer in conjunction with pembrolizumab, planned duration until disease progression or unacceptable drug toxicity.  CMP from 02/12/20 assessed, no relevant lab abnormalities. Prescription dose and frequency assessed.   Current medication list in Epic reviewed, one DDIs with lenvatinib identified: -Methadone: Lenvatinib is a hish risk medication for QTc prolongation and methadone also has a risk of QTc- prolongation. The recommendation is to consider alternatives if possible, if not patient should be actively monitored to QTc prolongation/arrhythmias while on both agents. No recent ECG on file. Drug-drug interaction discussed with Dr. Alvy Bimler, she plans on checking an ECG for the patient at her next visit to the office ahead of starting her treatment.  Prescription has been e-scribed to the [facility] for benefits analysis and approval.  Oral Oncology Clinic will continue to follow for insurance authorization, copayment issues, initial counseling and start date.  Darl Pikes, PharmD, BCPS, BCOP, CPP Hematology/Oncology Clinical Pharmacist [facility] [place] [facility] [phone]  02/12/2020 11:27 AM

## 2020-02-12 NOTE — Assessment & Plan Note (Signed)
Unfortunately, despite improvement in her tumor marker levels, CT imaging showed disease progression I will cancel her treatment today She has very mild pancytopenia which I expect to improvement next week She is mildly symptomatic with recent onset of worsening constipation We discussed aggressive laxative therapy I reviewed the guidelines with the patient and family We discussed chemotherapy options including single agent topotecan, liposomal doxorubicin, combination treatment of pembrolizumab plus lenvatinib versus antiestrogen therapy The risks, benefits, side effects of each options were discussed in great detail and ultimately, she choose to proceed with combination treatment of pembrolizumab plus lenvatinib I am hopeful that we can get her started on treatment next week I will get insurance prior authorization to try to get lenvatinib filled by next week  We reviewed the current guidelines Goal is palliative I recommend switching her treatment with Lenvima and pembrolizumab.   The combination was approved following review conducted under [employer], an intiative of the [place] which provides a framework for concurrent submission and review of oncology drugs among international partners. The FDA approved the combination with the [place] Therapeutic Goods Administration and [facility] [place].   Efficacy of the drugs together was investigated in Study 111/KEYNOTE-146 ([id number]), a single-arm, multicenter, open-label, multi-cohort trial that enrolled 108 patients with metastatic endometrial cancer that had progressed following at least one prior systemic therapy in any setting.  Patients took 20 mg of lenvatinib orally once daily in combination with 200 mg of pembrolizumab administered intravenously every 3 weeks until unacceptable toxicity or disease progression.  Among the 108 patients, 94 had tumors that were not MSI-H or dMMR, 11 had tumors that were MSI-H or  dMMR, and in 3 patients the tumor MSI-H or dMMR status was not known.  Results  The major efficacy outcome measures were objective response rate (ORR) and duration of response (DOR) by independent radiologic review committee using RECIST 1.1. The ORR in the 94 patients whose tumors were not MSI-H or dMMR was 38.3% (95% CI: 29%, 49%) with 10 complete responses (10.6%) and 26 partial responses (27.7%). Median DOR was not reached at the time of data cutoff and 25 patients (69% of responders) had response durations ?6 months.  In another updated publication published on JCO: DOI: 10.1200/JCO.19.02627 Journal of Clinical Oncology, Published online December 26, 2018. Lenvatinib Plus Pembrolizumab in Patients With Advanced Endometrial Cancer Abstract PURPOSE  Patients with advanced endometrial carcinoma have limited treatment options. We report final primary efficacy analysis results for a patient cohort with advanced endometrial carcinoma receiving lenvatinib plus pembrolizumab in an ongoing phase Ib/II study of selected solid tumors. METHODS  Patients took lenvatinib 20 mg once daily orally plus pembrolizumab 200 mg intravenously once every 3 weeks, in 3-week cycles. The primary end point was objective response rate (ORR) at 24 weeks ([id number]); secondary efficacy end points included duration of response (DOR), progression-free survival (PFS), and overall survival (OS). Tumor assessments were evaluated by investigators per immune-related RECIST.  RESULTS  At data cutoff, 108 patients with previously treated endometrial carcinoma were enrolled, with a median follow-up of 18.7 months. The STMHD62 was 38.0% (95% CI, 28.8% to 47.8%). Among subgroups, the IWLNL89 (95% CI) was 63.6% (30.8% to 89.1%) in patients with microsatellite instability (MSI)-high tumors (n = 11) and 36.2% (26.5% to 46.7%) in patients with microsatellite-stable tumors (n = 94). For previously treated patients, regardless of tumor MSI status, the  median DOR was 21.2 months (95% CI, 7.6 months to not estimable), median PFS was 7.4 months (95%  CI, 5.3 to 8.7 months), and median OS was 16.7 months (15.0 months to not estimable). Grade 3 or 4 treatment-related adverse events occurred in 83/124 (66.9%) patients. CONCLUSION  Lenvatinib plus pembrolizumab showed promising antitumor activity in patients with advanced endometrial carcinoma who have experienced disease progression after prior systemic therapy, regardless of tumor MSI status. The combination therapy had a manageable toxicity profile. The most common adverse reactions for endometrial cancer were fatigue, hypertension, musculoskeletal pain, diarrhea, decreased appetite, hypothyroidism, nausea, stomatitis, vomiting, decreased weight, abdominal pain, headache, constipation, urinary tract infection, dysphonia, hemorrhagic events, hypomagnesemia, palmar-plantar erythrodysesthesia, dyspnea, cough, and rash.   The most common adverse reactions for endometrial cancer were fatigue, hypertension, musculoskeletal pain, diarrhea, decreased appetite, hypothyroidism, nausea, stomatitis, vomiting, decreased weight, abdominal pain, headache, constipation, urinary tract infection, dysphonia, hemorrhagic events, hypomagnesemia, palmar-plantar erythrodysesthesia, dyspnea, cough, and rash.   After much discussion, the patient would like to proceed with the plan of care. The patient instructed to check her blood pressure twice a day I will review her blood pressure monitoring next week We also discussed option of DVT prevention The patient is at extremely high risk of DVT due to reduced mobility, cancer progression as well as obesity We discussed ASCO guidelines to consider NOAC for DVT prevention We discussed the risk and benefits of NOAC and she agreed to proceed

## 2020-02-12 NOTE — Assessment & Plan Note (Signed)
I will take over her pain management from now on with permission from her pain specialist She is aware of narcotic refill policy

## 2020-02-15 NOTE — Telephone Encounter (Signed)
Oral Oncology Patient Advocate Encounter  Prior Authorization for [name] has been approved.    PA# [id number] Effective dates: 02/12/2020 through 10/14/2020  Oral Oncology Clinic will continue to follow.   [name] Patient [place] Phone [phone] Fax [phone] 02/15/2020 8:37 AM

## 2020-02-15 NOTE — Telephone Encounter (Signed)
error 

## 2020-02-16 ENCOUNTER — Telehealth: Payer: Self-pay

## 2020-02-16 NOTE — Telephone Encounter (Signed)
Oral Chemotherapy Pharmacist Encounter  Ms. Wittner will get started on Lenvima 10mg  while he [place] application is pending approval.  Patient Education I spoke with patient for overview of new oral chemotherapy medication: Lenvima (lenvatinib) for the treatment of endometrial cancer in conjunction with pembrolizumab, planned duration until disease progression or unacceptable drug toxicity.   Pt is doing well. Counseled patient on administration, dosing, side effects, monitoring, drug-drug interaction, drug-food interactions, safe handling, storage, and disposal.  Side effects include but not limited to: HTN, diarrhea, fatigue, N/V, hand-foot syndrome.    Reviewed with patient importance of keeping a medication schedule and plan for any missed doses.  Ms. Rodd voiced understanding and appreciation. All questions answered. Medication handout placed in the mail.  Provided patient with [facility] phone number. Patient knows to call the office with questions or concerns. Oral Chemotherapy Navigation Clinic will continue to follow.  Darl Pikes, PharmD, BCPS, BCOP, CPP Hematology/Oncology Clinical Pharmacist [facility] [place] [facility] [phone]  02/16/2020 3:18 PM

## 2020-02-16 NOTE — Telephone Encounter (Signed)
Oral Oncology Patient Advocate Encounter  Met patient in lobby to complete application for [employer] in an effort to reduce patient's out of pocket expense for Lenvima to $0.    Application completed and faxed to [phone].   [employer] patient assistance phone number for follow up is [phone].   This encounter will be updated until final determination.    [place] Patient [place] Phone [phone] Fax [phone] 02/16/2020 2:01 PM

## 2020-02-19 ENCOUNTER — Other Ambulatory Visit: Payer: Self-pay

## 2020-02-19 ENCOUNTER — Inpatient Hospital Stay: Payer: Medicare Other | Admitting: Hematology and Oncology

## 2020-02-19 ENCOUNTER — Encounter: Payer: Self-pay | Admitting: Hematology and Oncology

## 2020-02-19 ENCOUNTER — Inpatient Hospital Stay: Payer: Medicare Other

## 2020-02-19 ENCOUNTER — Inpatient Hospital Stay: Payer: Medicare Other | Attending: Hematology and Oncology

## 2020-02-19 DIAGNOSIS — R001 Bradycardia, unspecified: Secondary | ICD-10-CM | POA: Diagnosis not present

## 2020-02-19 DIAGNOSIS — Z5112 Encounter for antineoplastic immunotherapy: Secondary | ICD-10-CM | POA: Diagnosis not present

## 2020-02-19 DIAGNOSIS — E876 Hypokalemia: Secondary | ICD-10-CM | POA: Insufficient documentation

## 2020-02-19 DIAGNOSIS — G893 Neoplasm related pain (acute) (chronic): Secondary | ICD-10-CM

## 2020-02-19 DIAGNOSIS — D61818 Other pancytopenia: Secondary | ICD-10-CM

## 2020-02-19 DIAGNOSIS — C541 Malignant neoplasm of endometrium: Secondary | ICD-10-CM

## 2020-02-19 DIAGNOSIS — Z7189 Other specified counseling: Secondary | ICD-10-CM

## 2020-02-19 DIAGNOSIS — I10 Essential (primary) hypertension: Secondary | ICD-10-CM | POA: Diagnosis not present

## 2020-02-19 LAB — CMP (CANCER CENTER ONLY)
ALT: 17 U/L (ref 0–44)
AST: 16 U/L (ref 15–41)
Albumin: 3.5 g/dL (ref 3.5–5.0)
Alkaline Phosphatase: 102 U/L (ref 38–126)
Anion gap: 13 (ref 5–15)
BUN: 12 mg/dL (ref 8–23)
CO2: 29 mmol/L (ref 22–32)
Calcium: 8.9 mg/dL (ref 8.9–10.3)
Chloride: 98 mmol/L (ref 98–111)
Creatinine: 0.76 mg/dL (ref 0.44–1.00)
GFR, Est AFR Am: >60 mL/min (ref >60)
GFR, Estimated: >60 mL/min (ref >60)
Glucose, Bld: 169 mg/dL — ABNORMAL HIGH (ref 70–99)
Potassium: 2.9 mmol/L — CL (ref 3.5–5.1)
Sodium: 140 mmol/L (ref 135–145)
Total Bilirubin: 0.5 mg/dL (ref 0.3–1.2)
Total Protein: 6.8 g/dL (ref 6.5–8.1)

## 2020-02-19 LAB — CBC WITH DIFFERENTIAL (CANCER CENTER ONLY)
Abs Immature Granulocytes: 0.01 10*3/uL (ref 0.00–0.07)
Basophils Absolute: 0 10*3/uL (ref 0.0–0.1)
Basophils Relative: 1 %
Eosinophils Absolute: 0 10*3/uL (ref 0.0–0.5)
Eosinophils Relative: 1 %
HCT: 29.6 % — ABNORMAL LOW (ref 36.0–46.0)
Hemoglobin: 9.8 g/dL — ABNORMAL LOW (ref 12.0–15.0)
Immature Granulocytes: 0 %
Lymphocytes Relative: 26 %
Lymphs Abs: 1 10*3/uL (ref 0.7–4.0)
MCH: 33.9 pg (ref 26.0–34.0)
MCHC: 33.1 g/dL (ref 30.0–36.0)
MCV: 102.4 fL — ABNORMAL HIGH (ref 80.0–100.0)
Monocytes Absolute: 0.5 10*3/uL (ref 0.1–1.0)
Monocytes Relative: 14 %
Neutro Abs: 2.2 10*3/uL (ref 1.7–7.7)
Neutrophils Relative %: 58 %
Platelet Count: 124 10*3/uL — ABNORMAL LOW (ref 150–400)
RBC: 2.89 MIL/uL — ABNORMAL LOW (ref 3.87–5.11)
RDW: 20.7 % — ABNORMAL HIGH (ref 11.5–15.5)
WBC Count: 3.8 10*3/uL — ABNORMAL LOW (ref 4.0–10.5)
nRBC: 0 % (ref 0.0–0.2)

## 2020-02-19 LAB — TOTAL PROTEIN, URINE DIPSTICK: Protein, ur: NEGATIVE mg/dL

## 2020-02-19 LAB — TSH: TSH: 3.134 u[IU]/mL (ref 0.308–3.960)

## 2020-02-19 MED ORDER — SODIUM CHLORIDE 0.9 % IV SOLN
Freq: Once | INTRAVENOUS | Status: AC
  Filled 2020-02-19: qty 250

## 2020-02-19 MED ORDER — SODIUM CHLORIDE 0.9% FLUSH
10.0000 mL | INTRAVENOUS | Status: DC | PRN
  Administered 2020-02-19: 10 mL
  Filled 2020-02-19: qty 10

## 2020-02-19 MED ORDER — SODIUM CHLORIDE 0.9 % IV SOLN
200.0000 mg | Freq: Once | INTRAVENOUS | Status: AC
  Administered 2020-02-19: 200 mg via INTRAVENOUS
  Filled 2020-02-19: qty 8

## 2020-02-19 MED ORDER — METHADONE HCL 10 MG PO TABS: 20.0000 mg | ORAL_TABLET | Freq: Two times a day (BID) | ORAL | 0 refills | Status: DC

## 2020-02-19 MED ORDER — RIVAROXABAN 20 MG PO TABS: 20.0000 mg | ORAL_TABLET | Freq: Every day | ORAL | 9 refills | Status: AC

## 2020-02-19 MED ORDER — HEPARIN SOD (PORK) LOCK FLUSH 100 UNIT/ML IV SOLN
500.0000 [IU] | Freq: Once | INTRAVENOUS | Status: AC | PRN
  Administered 2020-02-19: 500 [IU]
  Filled 2020-02-19: qty 5

## 2020-02-19 NOTE — Assessment & Plan Note (Signed)
This is likely due to recent side effects of treatment She is not symptomatic Observe only for now 

## 2020-02-19 NOTE — Assessment & Plan Note (Signed)
We discussed the risks, benefits, side effects of combination chemotherapy with pembrolizumab and lenvatinib again today with the patient and her son and they are in agreement to proceed Due to some mild delay of lenvatinib, she will be given sample at 10 mg only to start with I think that is reasonable Her EKG is normal today I will see her again next week for further follow-up and repeat blood work and toxicity review

## 2020-02-19 NOTE — Assessment & Plan Note (Signed)
We discussed the importance of getting blood pressure under control and close monitoring at home She is recommended to document her blood pressure twice a day and will call me if her blood pressure is grossly elevated I will see her again next week for further follow-up

## 2020-02-19 NOTE — Assessment & Plan Note (Signed)
I will take over her pain management from now on with permission from her pain specialist Her pain is well controlled with methadone as long-acting agents along with breakthrough medicine as needed She is aware of narcotic refill policy

## 2020-02-19 NOTE — Progress Notes (Signed)
Reported critical potassium 2.9 to Dr. Gorsuch. 

## 2020-02-19 NOTE — Progress Notes (Signed)
[place] OFFICE PROGRESS NOTE  Patient Care Team: Ronita Hipps, MD as PCP - General (Family Medicine) Nicholaus Bloom, MD (Anesthesiology)  ASSESSMENT & PLAN:  Endometrial cancer [facility]) We discussed the risks, benefits, side effects of combination chemotherapy with pembrolizumab and lenvatinib again today with the patient and her son and they are in agreement to proceed Due to some mild delay of lenvatinib, she will be given sample at 10 mg only to start with I think that is reasonable Her EKG is normal today I will see her again next week for further follow-up and repeat blood work and toxicity review  Pancytopenia, acquired [facility]) This is likely due to recent side effects of treatment She is not symptomatic Observe only for now  Cancer associated pain I will take over her pain management from now on with permission from her pain specialist Her pain is well controlled with methadone as long-acting agents along with breakthrough medicine as needed She is aware of narcotic refill policy  Essential hypertension We discussed the importance of getting blood pressure under control and close monitoring at home She is recommended to document her blood pressure twice a day and will call me if her blood pressure is grossly elevated I will see her again next week for further follow-up  Hypokalemia due to loss of potassium This is due to recent diarrhea We discussed potassium rich diet I plan to recheck it next week I do not recommend potassium replacement therapy for now   No orders of the defined types were placed in this encounter.   All questions were answered. The patient knows to call the clinic with any problems, questions or concerns. The total time spent in the appointment was 30 minutes encounter with patients including review of chart and various tests results, discussions about plan of care and coordination of care plan   Heath Lark, MD 02/19/2020 11:33  AM  INTERVAL HISTORY: Please see below for problem oriented charting. She returns today with her son for further follow-up They have numerous questions about the plan of care Since last time I saw her, she has been using diuretic therapy and lost a lot of fluid weight She also have recent diarrhea that is still limiting She is cutting back on some stool softener Her appetite is Falor She has purchased a new blood pressure machine but is having difficulties getting the good reading because the cuff was too tight causing pain Denies nausea Her chronic back pain is stable on current prescription pain medicine  SUMMARY OF ONCOLOGIC HISTORY: Oncology History Overview Note  MSI - Stable MMR - Normal 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 A Pap was done 09/02/18 showign ASC-[facility]. She was referred onto GYN and seen by Dr. Paula Compton    09/09/2018 Procedure   She underwent endometrial biopsy   09/12/2018 Pathology Results   Endometrial biopsy positive for adenocarcinoma   09/22/2018 Imaging   Ct abdomen and pelvis showed: Diffuse endometrial thickening, consistent with primary endometrial carcinoma.  Pelvic and abdominal retroperitoneal and retrocrural lymphadenopathy, consistent with metastatic disease.  Moderate hepatic steatosis.   09/25/2018 Cancer Staging   Staging form: Corpus Uteri - Carcinoma and Carcinosarcoma, AJCC 8th Edition - Pathologic: Stage IIIC2 (pT2, pN2, cM0) - Signed by Heath Lark, MD on 02/23/2019   10/01/2018 Procedure   Successful placement  of a right IJ approach Power Port with ultrasound and fluoroscopic guidance. The catheter is ready for use.   10/02/2018 Tumor Marker   Patient's tumor was tested for the following markers: CA-125 Results of the tumor marker test revealed 578   10/03/2018 -  01/16/2019 Chemotherapy   The patient had carboplatin and Taxol with dose adjustment due to neuropathy x 6 cycles   10/24/2018 Tumor Marker   Patient's tumor was tested for the following markers: CA-125 Results of the tumor marker test revealed 296    Genetic Testing   Patient has genetic testing done for MSI/MMR. Results revealed MSI stable and MMR normal on Accession [id number].   11/14/2018 Tumor Marker   Patient's tumor was tested for the following markers: CA-125 Results of the tumor marker test revealed 81   11/27/2018 Imaging   1. Significant decrease in abdominal retroperitoneal and bilateral iliac lymphadenopathy since previous study. 2. Significant decrease in abnormal endometrial soft tissue density since prior study. 3. No new or progressive metastatic disease identified.   12/05/2018 Tumor Marker   Patient's tumor was tested for the following markers: CA-125 Results of the tumor marker test revealed 46.3   12/26/2018 Tumor Marker   Patient's tumor was tested for the following markers: CA-125 Results of the tumor marker test revealed 38.2   01/16/2019 Tumor Marker   Patient's tumor was tested for the following markers: CA-125 Results of the tumor marker test revealed 30.8   02/03/2019 Imaging   1. Numerous retroperitoneal, iliac, and pelvic lymph nodes are stable or slightly decreased in size compared to immediate prior examination dated 11/27/2018 and significantly decreased in size compared to exam dated 09/20/2018.  2.  Unchanged thickening of the endometrium.  3. Other chronic, incidental, and postoperative findings as detailed above.   02/12/2019 Imaging   1. Uterus +/- tubes/ovaries, neoplastic, cervix, bilateral fallopian tubes and ovaries - MIXED HIGH-GRADE SEROUS AND ENDOMETRIAL ADENOCARCINOMA, 6.5 CM. SEE NOTE - CARCINOMA INVOLVES ENTIRE ENDOMETRIUM, ANTERIOR AND POSTERIOR LOWER UTERINE SEGMENTS AND ANTERIOR AND POSTERIOR CERVIX. - CARCINOMA INVADES FOR A  DEPTH OF 1.9 CM WHERE MYOMETRIAL THICKNESS IS 2.0 CM (GREATER THAN 50% MYOMETRIAL INVASION) - CARCINOMA IS 1 MM FROM THE CERVICAL MARGIN - NEGATIVE FOR LYMPHOVASCULAR OR PERINEURAL INVASION - BILATERAL UNREMARKABLE FALLOPIAN TUBES AND OVARIES, NEGATIVE FOR CARCINOMA - SEE ONCOLOGY TABLE 2. Lymph node, biopsy, right pelvic - METASTATIC CARCINOMA TO ONE OF TWO LYMPH NODES (1/2) Microscopic Comment 1. (v4.1.0.0) UTERUS, CARCINOMA OR CARCINOSARCOMA Procedure: Total hysterectomy with bilateral salpingo-oophorectomy Histologic type: Mixed high-grade serous and endometrioid adenocarcinoma Histologic Grade: NA Myometrial invasion: Present Depth of invasion: 19 mm Myometrial thickness: 20 mm Uterine Serosa Involvement: Not identified Cervical stromal involvement: Present Extent of involvement of other organs: NA Lymphovascular invasion: Not identified Regional Lymph Nodes: Examined: 0 Sentinel 2 Non-sentinel 2 Total Lymph nodes with metastasis: 1 Isolated tumor cells (< 0.2 mm): 0 Micrometastasis: (> 0.2 mm and < 2.0 mm): 1 Macrometastasis: (> 2.0 mm): 0 Extracapsular extension: Not identified Tumor block for ancillary studies: 40F MMR / MSI testing: Pending Pathologic Stage Classification (pTNM, AJCC 8th edition): pT2, pN25m FIGO Stage: IIIC1 Representative Tumor Block: 1A, 1C (v4.1.0.0) Diagnosis Note 1. Immunohistochemical stains for p16, p53, ER and Ki-67 show a pattern of staining consistent with mixed high-grade serous and endometrial adenocarcinoma. Dr. KLyndon Codehas reviewed this case and concurs with the above interpretation. A molecular study for microsatellite instability (MSI) and immunostains for MMR-related proteins are pending and will be reported in an addendum.  02/12/2019 Surgery   Pre-operative Diagnosis: endometrial cancer stage IIIC2, s/p neoadjuvant chemotherapy, extreme obesity (BMI 53kg/m2)  Operation: Robotic-assisted laparoscopic total hysterectomy with bilateral  salpingoophorectomy, right pelvic lymphadenectomy. 22 modifier for extreme obesity  Surgeon: Donaciano Eva  Assistant Surgeon: Lahoma Crocker MD  Operative Findings:  : extreme obesity, BMI 53kg/m2, with significant retroperitoneal and intraperitoneal adiposity. Obesity necessitated an additional hour of operating time to create safe exposure. Obesity required additional personnel in the operating room for positioning and retraction. Severe obesity substantially increased the complexity of the procedure. 8cm uterus, grossly normal, normal appearing cervix. Retroperitoneal fibrosis consistent with nodal positivity. Clinically suspicious right pelvic lymph node. Unable to completely visualize retroperitoneal nodes due to extreme obesity.    02/12/2019 Pathology Results   IMMUNOHISTOCHEMICAL AND MORPHOMETRIC ANALYSIS PERFORMED MANUALLY The tumor cells are POSITIVE for Her2 (3+). Estrogen Receptor: 70%, POSITIVE, STRONG STAINING INTENSITY Progesterone Receptor: 70%, POSITIVE, STRONG STAINING INTENSITY Proliferation Marker Ki67: 20% REFERENCE RANGE ESTROGEN RECEPTOR NEGATIVE 0% POSITIVE =>1% REFERENCE RANGE PROGESTERONE RECEPTOR NEGATIVE 0% POSITIVE =>1% All controls stained appropriately  1. Uterus +/- tubes/ovaries, neoplastic, cervix, bilateral fallopian tubes and ovaries - MIXED HIGH-GRADE SEROUS AND ENDOMETRIAL ADENOCARCINOMA, 6.5 CM. SEE NOTE - CARCINOMA INVOLVES ENTIRE ENDOMETRIUM, ANTERIOR AND POSTERIOR LOWER UTERINE SEGMENTS AND ANTERIOR AND POSTERIOR CERVIX. - CARCINOMA INVADES FOR A DEPTH OF 1.9 CM WHERE MYOMETRIAL THICKNESS IS 2.0 CM (GREATER THAN 50% MYOMETRIAL INVASION) - CARCINOMA IS 1 MM FROM THE CERVICAL MARGIN - NEGATIVE FOR LYMPHOVASCULAR OR PERINEURAL INVASION - BILATERAL UNREMARKABLE FALLOPIAN TUBES AND OVARIES, NEGATIVE FOR CARCINOMA - SEE ONCOLOGY TABLE 2. Lymph node, biopsy, right pelvic - METASTATIC CARCINOMA TO ONE OF TWO LYMPH NODES  (1/2) Microscopic Comment 1. (v4.1.0.0) UTERUS, CARCINOMA OR CARCINOSARCOMA Procedure: Total hysterectomy with bilateral salpingo-oophorectomy Histologic type: Mixed high-grade serous and endometrioid adenocarcinoma Histologic Grade: NA Myometrial invasion: Present Depth of invasion: 19 mm Myometrial thickness: 20 mm Uterine Serosa Involvement: Not identified Cervical stromal involvement: Present Extent of involvement of other organs: NA Lymphovascular invasion: Not identified Regional Lymph Nodes: Examined: 0 Sentinel 2 Non-sentinel 2 Total Lymph nodes with metastasis: 1 Isolated tumor cells (< 0.2 mm): 0 Micrometastasis: (> 0.2 mm and < 2.0 mm): 1 Macrometastasis: (> 2.0 mm): 0 Extracapsular extension: Not identified Tumor block for ancillary studies: 68F MMR / MSI testing: Pending Pathologic Stage Classification (pTNM, AJCC 8th edition): pT2, pN78m FIGO Stage: IIIC1 Representative Tumor Block: 1A, 1C   03/17/2019 Echocardiogram   1. The left ventricle has normal systolic function, with an ejection fraction of 55-60%. The cavity size was normal. There is moderate concentric left ventricular hypertrophy. Left ventricular diastolic Doppler parameters are consistent with impaired relaxation.  2. The right ventricle has normal systolic function. The cavity was normal. There is no increase in right ventricular wall thickness.  3. GLS: -15.5%, no prior study available for comparison.   03/17/2019 Tumor Marker   Patient's tumor was tested for the following markers: CA-125 Results of the tumor marker test revealed 43   03/20/2019 -  Chemotherapy   The patient had trastuzumab for chemotherapy treatment.     04/30/2019 Tumor Marker   Patient's tumor was tested for the following markers:CA-125 Results of the tumor marker test revealed 29   05/20/2019 Imaging   1. No evidence of metastatic disease. 2. Small pelvic and left paracolic gutter ascites, new. 3. Aortic atherosclerosis  (ICD10-170.0). Coronary artery calcification.     05/26/2019 Echocardiogram      1. The left ventricle has normal systolic  function with an ejection fraction of 60-65%. The cavity size was normal. There is mildly increased left ventricular wall thickness. Left ventricular diastolic parameters were normal.  2. GLS -16.4% (underestimated due to poor endocardial tracking).  3. The right ventricle has normal systolic function. The cavity was normal. There is no increase in right ventricular wall thickness.  4. Mild calcification of the mitral valve leaflet.  5. Mild calcification of the aortic valve. No stenosis of the aortic valve.  6. The aorta is normal in size and structure.   09/02/2019 Imaging   No evidence of recurrent or metastatic carcinoma within the abdomen or pelvis.   Colonic diverticulosis. No radiographic evidence of diverticulitis.   Increased large stool burden noted; recommend clinical correlation for possible constipation.   09/02/2019 Echocardiogram   1. Left ventricular ejection fraction, by visual estimation, is 60 to 65%. The left ventricle has normal function. There is no left ventricular hypertrophy.  2. Global right ventricle has normal systolic function.The right ventricular size is normal. No increase in right ventricular wall thickness.  3. Left atrial size was normal.  4. Right atrial size was normal.  5. The mitral valve is normal in structure. No evidence of mitral valve regurgitation.  6. The tricuspid valve is normal in structure. Tricuspid valve regurgitation is trivial.  7. The aortic valve is normal in structure. Aortic valve regurgitation is not visualized.  8. The pulmonic valve was normal in structure. Pulmonic valve regurgitation is trivial.  9. Mildly elevated pulmonary artery systolic pressure. 10. The average left ventricular global longitudinal strain is -26.9 %.   09/03/2019 Tumor Marker   Patient's tumor was tested for the following markers:  CA-125 Results of the tumor marker test revealed 13.5   11/05/2019 Tumor Marker   Patient's tumor was tested for the following markers: CA-125 Results of the tumor marker test revealed 27.   11/25/2019 Imaging   1. New omental nodules suspicious for early peritoneal spread of malignancy. 2. New thickening along the right paracolic gutter concerning for potential tumor. 3. Other imaging findings of potential clinical significance: Mild cardiomegaly. Stable lateral segment left hepatic lobe hypodense lesions, likely cysts. Multilevel posterior decompression with multilevel foraminal impingement. 4. 4 mm left lower lobe pulmonary nodule not readily appreciable on the prior exam, could be inflammatory or neoplastic. Surveillance suggested. 5. Other imaging findings of potential clinical significance: Mild cardiomegaly. Suspected hepatic cysts. Multilevel lumbar impingement.   Aortic Atherosclerosis (ICD10-I70.0).   11/26/2019 Tumor Marker   Patient's tumor was tested for the following markers: CA-125 Results of the tumor marker test revealed 129   12/11/2019 -  Chemotherapy   The patient had carboplatin and taxol for chemotherapy treatment.     01/01/2020 Tumor Marker   Patient's tumor was tested for the following markers: CA-125 Results of the tumor marker test revealed 123   01/22/2020 Tumor Marker   Patient's tumor was tested for the following markers: CA-125 Results of the tumor marker test revealed 109.   02/11/2020 Imaging   1. Interval worsening of disease as evidenced by new and enlarging peritoneal and omental nodularity. 2. Slight enlargement of retroperitoneal lymph nodes. 3. New small area of low attenuation along the medial margin of the spleen, potentially small capsular implant not seen on the previous study.   Aortic Atherosclerosis (ICD10-I70.0).     02/19/2020 -  Chemotherapy   The patient had pembrolizumab (KEYTRUDA) 200 mg in sodium chloride 0.9 % 50 mL chemo infusion,  200 mg, Intravenous, Once,  1 of 6 cycles Administration: 200 mg (02/19/2020)  for chemotherapy treatment.      REVIEW OF SYSTEMS:   Constitutional: Denies fevers, chills or abnormal weight loss Eyes: Denies blurriness of vision Ears, nose, mouth, throat, and face: Denies mucositis or sore throat Respiratory: Denies cough, dyspnea or wheezes Cardiovascular: Denies palpitation, chest discomfort  Skin: Denies abnormal skin rashes Lymphatics: Denies new lymphadenopathy or easy bruising Neurological:Denies numbness, tingling or new weaknesses Behavioral/Psych: Mood is stable, no new changes  All other systems were reviewed with the patient and are negative.  I have reviewed the past medical history, past surgical history, social history and family history with the patient and they are unchanged from previous note.  ALLERGIES:  is allergic to tegaderm ag mesh [silver].  MEDICATIONS:  Current Outpatient Medications  Medication Sig Dispense Refill  . diphenhydrAMINE (BENADRYL) 25 mg capsule Take 100 mg by mouth 2 (two) times a day.    . furosemide (LASIX) 20 MG tablet Take 1 tablet (20 mg total) by mouth daily. 30 tablet 1  . gabapentin (NEURONTIN) 400 MG capsule Take 3 capsules (1,200 mg total) by mouth 3 (three) times daily. 270 capsule 11  . lenvatinib 20 mg daily dose (LENVIMA) 2 x 10 MG capsule Take 2 capsules (20 mg total) by mouth daily. 60 capsule 11  . lidocaine-prilocaine (EMLA) cream Apply to affected area once 30 g 3  . loratadine (CLARITIN) 10 MG tablet Take 10 mg by mouth daily.    Marland Kitchen losartan-hydrochlorothiazide (HYZAAR) 50-12.5 MG tablet Take 2 tablets by mouth daily.    . methadone (DOLOPHINE) 10 MG tablet Take 2 tablets (20 mg total) by mouth every 12 (twelve) hours. 90 tablet 0  . ondansetron (ZOFRAN) 8 MG tablet Take 8 mg by mouth every 8 (eight) hours as needed.    Marland Kitchen oxyCODONE-acetaminophen (PERCOCET) 10-325 MG tablet Take 1-2 tablets by mouth every 6 (six) hours as needed  for pain.    Marland Kitchen prochlorperazine (COMPAZINE) 10 MG tablet TAKE 1 TABLET BY MOUTH EVERY 6 HOURS AS NEEDED FOR NAUSEA OR VOMITING 30 tablet 1  . ranitidine (ZANTAC) 150 MG capsule Take 150 mg by mouth 2 (two) times daily.    . rivaroxaban (XARELTO) 20 MG TABS tablet Take 1 tablet (20 mg total) by mouth daily with supper. 30 tablet 9   No current facility-administered medications for this visit.   Facility-Administered Medications Ordered in Other Visits  Medication Dose Route Frequency Provider Last Rate Last Admin  . sodium chloride flush (NS) 0.9 % injection 10 mL  10 mL Intracatheter PRN Alvy Bimler, Calley Drenning, MD   10 mL at 02/19/20 1105    PHYSICAL EXAMINATION: ECOG PERFORMANCE STATUS: 1 - Symptomatic but completely ambulatory  Vitals:   02/19/20 0844  BP: 136/73  Pulse: 84  Resp: 18  Temp: 98 F (36.7 C)  SpO2: 100%   Filed Weights   02/19/20 0844  Weight: 290 lb 3.2 oz (131.6 kg)    GENERAL:alert, no distress and comfortable HEART: regular rate & rhythm and no murmurs with mild lower extremity edema NEURO: alert & oriented x 3 with fluent speech, no focal motor/sensory deficits  LABORATORY DATA:  I have reviewed the data as listed    Component Value Date/Time   NA 140 02/19/2020 0805   K 2.9 (LL) 02/19/2020 0805   CL 98 02/19/2020 0805   CO2 29 02/19/2020 0805   GLUCOSE 169 (H) 02/19/2020 0805   BUN 12 02/19/2020 0805   CREATININE 0.76 02/19/2020  0805   CALCIUM 8.9 02/19/2020 0805   PROT 6.8 02/19/2020 0805   ALBUMIN 3.5 02/19/2020 0805   AST 16 02/19/2020 0805   ALT 17 02/19/2020 0805   ALKPHOS 102 02/19/2020 0805   BILITOT 0.5 02/19/2020 0805   GFRNONAA >60 02/19/2020 0805   GFRAA >60 02/19/2020 0805    No results found for: SPEP, UPEP  Lab Results  Component Value Date   WBC 3.8 (L) 02/19/2020   NEUTROABS 2.2 02/19/2020   HGB 9.8 (L) 02/19/2020   HCT 29.6 (L) 02/19/2020   MCV 102.4 (H) 02/19/2020   PLT 124 (L) 02/19/2020      Chemistry      Component  Value Date/Time   NA 140 02/19/2020 0805   K 2.9 (LL) 02/19/2020 0805   CL 98 02/19/2020 0805   CO2 29 02/19/2020 0805   BUN 12 02/19/2020 0805   CREATININE 0.76 02/19/2020 0805      Component Value Date/Time   CALCIUM 8.9 02/19/2020 0805   ALKPHOS 102 02/19/2020 0805   AST 16 02/19/2020 0805   ALT 17 02/19/2020 0805   BILITOT 0.5 02/19/2020 0805       RADIOGRAPHIC STUDIES: I have personally reviewed the radiological images as listed and agreed with the findings in the report. 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 Post hysterectomy. On systemic therapy. EXAM: CT ABDOMEN AND PELVIS WITH CONTRAST TECHNIQUE: Multidetector CT imaging of the abdomen and pelvis was performed using the standard protocol following bolus administration of intravenous contrast. CONTRAST:  171m OMNIPAQUE IOHEXOL 300 MG/ML  SOLN COMPARISON:  11/25/2019 FINDINGS: Lower chest: Basilar atelectasis without effusion or consolidation. Hepatobiliary: Stable mild post cholecystectomy biliary ductal distension. Lobular hepatic contours, unchanged from prior exam. No focal, suspicious hepatic lesion. Nodularity along the RIGHT hepatic margin is however new, in more since pouch measuring approximately 2.6 cm greatest length an approximately 8 mm greatest thickness. This is associated with diffuse peritoneal nodularity which is worsened since the previous exam, see below. Pancreas: Pancreas is normal without focal lesion, inflammation or ductal dilation. Spleen: Spleen with small area of low attenuation along the medial margin of the spleen, potentially small capsular implant not seen on the previous study (image 32, series 2) approximately 5 mm greatest thickness. Adrenals/Urinary Tract: Adrenal glands are normal. Kidneys with smooth renal contours, no hydronephrosis. Urinary bladder is normal. Stomach/Bowel: No sign of bowel  obstruction or acute bowel process. Vascular/Lymphatic: Retroperitoneal lymph nodes with slight enlarged, largest a preaortic lymph node in the retroperitoneum (image 39, series 2) measuring approximately 9 mm, previously 6 mm. Atheromatous plaque of the abdominal aorta tracking into branch vessels. Small lymph nodes in the pelvis Reproductive: Post hysterectomy. Nodule at the RIGHT vaginal apex 2.2 x 1.7 cm was not as discrete on the previous study and measured approximately 7 mm in short axis. Other: Extensive nodularity of the anterior peritoneum and omentum. Nodule in the anterior abdominal cavity 3 x 1.5 cm (image 55, series 2) no nodules greater than a cm in this location on the previous imaging study. Diffusely nodular peritoneum and omentum. A second example of this process is seen on image 47 of series 2 measuring 16 mm. RIGHT pericolic nodularity (image 48, series 2 1.9 x 1.1 cm just posterior to the RIGHT colon. Musculoskeletal: No destructive bone finding. Signs of lumbar laminectomy and fusion similar to the previous exam. This spans L3 through L5. IMPRESSION:  1. Interval worsening of disease as evidenced by new and enlarging peritoneal and omental nodularity. 2. Slight enlargement of retroperitoneal lymph nodes. 3. New small area of low attenuation along the medial margin of the spleen, potentially small capsular implant not seen on the previous study. Aortic Atherosclerosis (ICD10-I70.0). Electronically Signed   By: Zetta Bills M.D.   On: 02/11/2020 10:24

## 2020-02-19 NOTE — Assessment & Plan Note (Signed)
This is due to recent diarrhea We discussed potassium rich diet I plan to recheck it next week I do not recommend potassium replacement therapy for now

## 2020-02-19 NOTE — Patient Instructions (Signed)
[facility] Discharge Instructions for Patients Receiving Chemotherapy  Today you received the following agents Keytruda  To help prevent nausea and vomiting after your treatment, we encourage you to take your nausea medication as directed   If you develop nausea and vomiting that is not controlled by your nausea medication, call the clinic.   BELOW ARE SYMPTOMS THAT SHOULD BE REPORTED IMMEDIATELY:  *FEVER GREATER THAN 100.5 F  *CHILLS WITH OR WITHOUT FEVER  NAUSEA AND VOMITING THAT IS NOT CONTROLLED WITH YOUR NAUSEA MEDICATION  *UNUSUAL SHORTNESS OF BREATH  *UNUSUAL BRUISING OR BLEEDING  TENDERNESS IN MOUTH AND THROAT WITH OR WITHOUT PRESENCE OF ULCERS  *URINARY PROBLEMS  *BOWEL PROBLEMS  UNUSUAL RASH Items with * indicate a potential emergency and should be followed up as soon as possible.  Feel free to call the clinic should you have any questions or concerns. The clinic phone number is [phone].  Please show the [facility] at check-in to the Emergency Department and triage nurse.  Pembrolizumab [facility]) injection What is this medicine? PEMBROLIZUMAB (pem broe liz ue mab) is a monoclonal antibody. It is used to treat certain types of cancer. This medicine may be used for other purposes; ask your health care provider or pharmacist if you have questions. COMMON BRAND NAME(S): Keytruda What should I tell my health care provider before I take this medicine? They need to know if you have any of these conditions:  diabetes  immune system problems  inflammatory bowel disease  liver disease  lung or breathing disease  lupus  received or scheduled to receive an organ transplant or a stem-cell transplant that uses donor stem cells  an unusual or allergic reaction to pembrolizumab, other medicines, foods, dyes, or preservatives  pregnant or trying to get pregnant  breast-feeding How should I use this medicine? This medicine is for  infusion into a vein. It is given by a health care professional in a hospital or clinic setting. A special MedGuide will be given to you before each treatment. Be sure to read this information carefully each time. Talk to your pediatrician regarding the use of this medicine in children. While this drug may be prescribed for children as young as 6 months for selected conditions, precautions do apply. Overdosage: If you think you have taken too much of this medicine contact a poison control center or emergency room at once. NOTE: This medicine is only for you. Do not share this medicine with others. What if I miss a dose? It is important not to miss your dose. Call your doctor or health care professional if you are unable to keep an appointment. What may interact with this medicine? Interactions have not been studied. Give your health care provider a list of all the medicines, herbs, non-prescription drugs, or dietary supplements you use. Also tell them if you smoke, drink alcohol, or use illegal drugs. Some items may interact with your medicine. This list may not describe all possible interactions. Give your health care provider a list of all the medicines, herbs, non-prescription drugs, or dietary supplements you use. Also tell them if you smoke, drink alcohol, or use illegal drugs. Some items may interact with your medicine. What should I watch for while using this medicine? Your condition will be monitored carefully while you are receiving this medicine. You may need blood work done while you are taking this medicine. Do not become pregnant while taking this medicine or for 4 months after stopping it. Women should  inform their doctor if they wish to become pregnant or think they might be pregnant. There is a potential for serious side effects to an unborn child. Talk to your health care professional or pharmacist for more information. Do not breast-feed an infant while taking this medicine or for 4  months after the last dose. What side effects may I notice from receiving this medicine? Side effects that you should report to your doctor or health care professional as soon as possible:  allergic reactions like skin rash, itching or hives, swelling of the face, lips, or tongue  bloody or black, tarry  breathing problems  changes in vision  chest pain  chills  confusion  constipation  cough  diarrhea  dizziness or feeling faint or lightheaded  fast or irregular heartbeat  fever  flushing  joint pain  low blood counts - this medicine may decrease the number of white blood cells, red blood cells and platelets. You may be at increased risk for infections and bleeding.  muscle pain  muscle weakness  pain, tingling, numbness in the hands or feet  persistent headache  redness, blistering, peeling or loosening of the skin, including inside the mouth  signs and symptoms of high blood sugar such as dizziness; dry mouth; dry skin; fruity breath; nausea; stomach pain; increased hunger or thirst; increased urination  signs and symptoms of kidney injury like trouble passing urine or change in the amount of urine  signs and symptoms of liver injury like dark urine, light-colored stools, loss of appetite, nausea, right upper belly pain, yellowing of the eyes or skin  sweating  swollen lymph nodes  weight loss Side effects that usually do not require medical attention (report to your doctor or health care professional if they continue or are bothersome):  decreased appetite  hair loss  muscle pain  tiredness This list may not describe all possible side effects. Call your doctor for medical advice about side effects. You may report side effects to FDA at 1-800-FDA-1088. Where should I keep my medicine? This drug is given in a hospital or clinic and will not be stored at home. NOTE: This sheet is a summary. It may not cover all possible information. If you have  questions about this medicine, talk to your doctor, pharmacist, or health care provider.  2020 [employer]vier/Gold Standard (2019-08-07 18:07:58)  

## 2020-02-20 LAB — CA 125: Cancer Antigen (CA) 125: 308 U/mL — ABNORMAL HIGH (ref 0.0–38.1)

## 2020-02-20 LAB — T4: T4, Total: 8.7 ug/dL (ref 4.5–12.0)

## 2020-02-22 ENCOUNTER — Telehealth: Payer: Self-pay | Admitting: Emergency Medicine

## 2020-02-22 ENCOUNTER — Telehealth: Payer: Self-pay | Admitting: Hematology and Oncology

## 2020-02-22 NOTE — Telephone Encounter (Signed)
-----   Message from Ishmael Holter, RN sent at 02/19/2020 11:07 AM EDT ----- Regarding: Dr. Alvy Bimler 1st tx f/u call Dr. Alvy Bimler 1st [place] f/u call - Keytruda - tolerated well

## 2020-02-22 NOTE — Telephone Encounter (Signed)
Chemo f/u call, spoke with patient.  Pt reports only mild fatigue and a headache that was resolved with OTC tylenol.  Pt denies any other questions/concerns at this time.

## 2020-02-22 NOTE — Telephone Encounter (Signed)
Pt is aware of appts on 5/14. Per 5/7 sch msg.

## 2020-02-23 NOTE — Telephone Encounter (Signed)
Patient has been approved for Lenvima at no charge from [facility] 02/22/20-10/14/20.  [place] [facility] [place] Phone [phone] Fax [phone] 02/23/2020 8:50 AM

## 2020-02-25 ENCOUNTER — Other Ambulatory Visit: Payer: Self-pay | Admitting: Hematology and Oncology

## 2020-02-26 ENCOUNTER — Encounter: Payer: Self-pay | Admitting: Hematology and Oncology

## 2020-02-26 ENCOUNTER — Other Ambulatory Visit: Payer: Self-pay

## 2020-02-26 ENCOUNTER — Telehealth: Payer: Self-pay

## 2020-02-26 ENCOUNTER — Inpatient Hospital Stay: Payer: Medicare Other | Admitting: Hematology and Oncology

## 2020-02-26 ENCOUNTER — Inpatient Hospital Stay: Payer: Medicare Other

## 2020-02-26 VITALS — BP 167/82 | HR 80 | Temp 97.8°F | Resp 18 | Ht 63.0 in | Wt 285.4 lb

## 2020-02-26 DIAGNOSIS — C541 Malignant neoplasm of endometrium: Secondary | ICD-10-CM

## 2020-02-26 DIAGNOSIS — Z5112 Encounter for antineoplastic immunotherapy: Secondary | ICD-10-CM | POA: Diagnosis not present

## 2020-02-26 DIAGNOSIS — E876 Hypokalemia: Secondary | ICD-10-CM | POA: Diagnosis not present

## 2020-02-26 DIAGNOSIS — G893 Neoplasm related pain (acute) (chronic): Secondary | ICD-10-CM

## 2020-02-26 DIAGNOSIS — D61818 Other pancytopenia: Secondary | ICD-10-CM | POA: Diagnosis not present

## 2020-02-26 DIAGNOSIS — Z7189 Other specified counseling: Secondary | ICD-10-CM

## 2020-02-26 DIAGNOSIS — I10 Essential (primary) hypertension: Secondary | ICD-10-CM | POA: Diagnosis not present

## 2020-02-26 DIAGNOSIS — R001 Bradycardia, unspecified: Secondary | ICD-10-CM | POA: Diagnosis not present

## 2020-02-26 LAB — CBC WITH DIFFERENTIAL (CANCER CENTER ONLY)
Abs Immature Granulocytes: 0.01 10*3/uL (ref 0.00–0.07)
Basophils Absolute: 0 10*3/uL (ref 0.0–0.1)
Basophils Relative: 0 %
Eosinophils Absolute: 0 10*3/uL (ref 0.0–0.5)
Eosinophils Relative: 1 %
HCT: 34.7 % — ABNORMAL LOW (ref 36.0–46.0)
Hemoglobin: 11.5 g/dL — ABNORMAL LOW (ref 12.0–15.0)
Immature Granulocytes: 0 %
Lymphocytes Relative: 35 %
Lymphs Abs: 1.3 10*3/uL (ref 0.7–4.0)
MCH: 34.1 pg — ABNORMAL HIGH (ref 26.0–34.0)
MCHC: 33.1 g/dL (ref 30.0–36.0)
MCV: 103 fL — ABNORMAL HIGH (ref 80.0–100.0)
Monocytes Absolute: 0.4 10*3/uL (ref 0.1–1.0)
Monocytes Relative: 12 %
Neutro Abs: 1.9 10*3/uL (ref 1.7–7.7)
Neutrophils Relative %: 52 %
Platelet Count: 147 10*3/uL — ABNORMAL LOW (ref 150–400)
RBC: 3.37 MIL/uL — ABNORMAL LOW (ref 3.87–5.11)
RDW: 19.2 % — ABNORMAL HIGH (ref 11.5–15.5)
WBC Count: 3.6 10*3/uL — ABNORMAL LOW (ref 4.0–10.5)
nRBC: 0 % (ref 0.0–0.2)

## 2020-02-26 LAB — CMP (CANCER CENTER ONLY)
ALT: 17 U/L (ref 0–44)
AST: 21 U/L (ref 15–41)
Albumin: 3.7 g/dL (ref 3.5–5.0)
Alkaline Phosphatase: 111 U/L (ref 38–126)
Anion gap: 12 (ref 5–15)
BUN: 12 mg/dL (ref 8–23)
CO2: 30 mmol/L (ref 22–32)
Calcium: 9.2 mg/dL (ref 8.9–10.3)
Chloride: 98 mmol/L (ref 98–111)
Creatinine: 0.76 mg/dL (ref 0.44–1.00)
GFR, Est AFR Am: >60 mL/min (ref >60)
GFR, Estimated: >60 mL/min (ref >60)
Glucose, Bld: 121 mg/dL — ABNORMAL HIGH (ref 70–99)
Potassium: 3.2 mmol/L — ABNORMAL LOW (ref 3.5–5.1)
Sodium: 140 mmol/L (ref 135–145)
Total Bilirubin: 0.5 mg/dL (ref 0.3–1.2)
Total Protein: 7.1 g/dL (ref 6.5–8.1)

## 2020-02-26 LAB — TSH: TSH: 3.404 u[IU]/mL (ref 0.308–3.960)

## 2020-02-26 LAB — TOTAL PROTEIN, URINE DIPSTICK: Protein, ur: NEGATIVE mg/dL

## 2020-02-26 MED ORDER — SODIUM CHLORIDE 0.9% FLUSH
10.0000 mL | Freq: Once | INTRAVENOUS | Status: AC
  Administered 2020-02-26: 10 mL
  Filled 2020-02-26: qty 10

## 2020-02-26 MED ORDER — METOPROLOL TARTRATE 25 MG PO TABS: 25.0000 mg | ORAL_TABLET | Freq: Two times a day (BID) | ORAL | 3 refills | Status: DC

## 2020-02-26 MED ORDER — HEPARIN SOD (PORK) LOCK FLUSH 100 UNIT/ML IV SOLN
500.0000 [IU] | Freq: Once | INTRAVENOUS | Status: AC
  Administered 2020-02-26: 500 [IU]
  Filled 2020-02-26: qty 5

## 2020-02-26 NOTE — Assessment & Plan Note (Signed)
She has poorly controlled hypertension She has difficulties tolerating her blood pressure cuff Even when she was able to get accurate blood pressure reading from her right upper arm, her blood pressure is consistently high I recommend she continues taking furosemide and Hyzaar in the morning and I will introduce the addition of metoprolol twice a day She is instructed to also document her heart rate with each blood pressure monitoring

## 2020-02-26 NOTE — Assessment & Plan Note (Signed)
Overall, she tolerated treatment well without major side effects, except for significant hypertension She was noted to have major electrolyte imbalances in her last visit We will recheck labs today I will call her again next week to follow-up on her blood pressure control She will continue lenvatinib at 10 mg dose only, not to escalate until we get her blood pressure somewhat better controlled

## 2020-02-26 NOTE — Assessment & Plan Note (Signed)
Repeat potassium level has improved She will continue on potassium rich diet

## 2020-02-26 NOTE — Telephone Encounter (Signed)
-----   Message from Heath Lark, MD sent at 02/26/2020 10:19 AM EDT ----- Regarding: let her know labs are good. potassium is better. hemoglobin is better

## 2020-02-26 NOTE — Progress Notes (Signed)
[facility] [facility] OFFICE PROGRESS NOTE  Patient Care Team: Ronita Hipps, MD as PCP - General (Family Medicine) Nicholaus Bloom, MD (Anesthesiology)  ASSESSMENT & PLAN:  Endometrial cancer ([place]) Overall, she tolerated treatment well without major side effects, except for significant hypertension She was noted to have major electrolyte imbalances in her last visit We will recheck labs today I will call her again next week to follow-up on her blood pressure control She will continue lenvatinib at 10 mg dose only, not to escalate until we get her blood pressure somewhat better controlled  Essential hypertension She has poorly controlled hypertension She has difficulties tolerating her blood pressure cuff Even when she was able to get accurate blood pressure reading from her right upper arm, her blood pressure is consistently high I recommend she continues taking furosemide and Hyzaar in the morning and I will introduce the addition of metoprolol twice a day She is instructed to also document her heart rate with each blood pressure monitoring   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   All questions were answered. The patient knows to call the clinic with any problems, questions or concerns. The total time spent in the appointment was 20 minutes encounter with patients including review of chart and various tests results, discussions about plan of care and coordination of care plan   Heath Lark, MD 02/26/2020 9:35 AM  INTERVAL HISTORY: Please see below for problem oriented charting. She returns for further follow-up and toxicity check after starting her on treatment last week She denies side effects of  treatment She is attempting to eat potassium high diet She denies recent diarrhea Her pain is well controlled She has difficulties getting blood pressure readings Her systolic blood pressure typically runs between 150-190 She is able to get a better reading of her right upper arm due to severe pain with the blood pressure cuff on the left upper arm She has some nasal congestion/allergies and have some recent headaches but that has since resolved  SUMMARY OF ONCOLOGIC HISTORY: Oncology History Overview Note  MSI - Stable MMR - Normal 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 A Pap was done 09/02/18 showign ASC-H. She was referred onto GYN and seen by Dr. Paula Compton    09/09/2018 Procedure   She underwent endometrial biopsy   09/12/2018 Pathology Results   Endometrial biopsy positive for adenocarcinoma   09/22/2018 Imaging   Ct abdomen and pelvis showed: Diffuse endometrial thickening, consistent with primary endometrial carcinoma.  Pelvic and abdominal retroperitoneal and retrocrural lymphadenopathy, consistent with metastatic disease.  Moderate hepatic steatosis.   09/25/2018 Cancer Staging   Staging form: Corpus Uteri - Carcinoma and Carcinosarcoma, AJCC 8th Edition - Pathologic: Stage IIIC2 (pT2, pN2, cM0) - Signed by Heath Lark, MD on 02/23/2019   10/01/2018 Procedure   Successful placement of a right IJ approach Power Port with ultrasound and fluoroscopic guidance. The catheter is ready for use.   10/02/2018 Tumor Marker   Patient's tumor was tested for the following markers: CA-125 Results of the tumor marker test revealed 578  10/03/2018 - 01/16/2019 Chemotherapy   The patient had carboplatin and Taxol with dose adjustment due to neuropathy x 6 cycles   10/24/2018 Tumor Marker   Patient's tumor was tested for  the following markers: CA-125 Results of the tumor marker test revealed 296    Genetic Testing   Patient has genetic testing done for MSI/MMR. Results revealed MSI stable and MMR normal on Accession [id number].   11/14/2018 Tumor Marker   Patient's tumor was tested for the following markers: CA-125 Results of the tumor marker test revealed 81   11/27/2018 Imaging   1. Significant decrease in abdominal retroperitoneal and bilateral iliac lymphadenopathy since previous study. 2. Significant decrease in abnormal endometrial soft tissue density since prior study. 3. No new or progressive metastatic disease identified.   12/05/2018 Tumor Marker   Patient's tumor was tested for the following markers: CA-125 Results of the tumor marker test revealed 46.3   12/26/2018 Tumor Marker   Patient's tumor was tested for the following markers: CA-125 Results of the tumor marker test revealed 38.2   01/16/2019 Tumor Marker   Patient's tumor was tested for the following markers: CA-125 Results of the tumor marker test revealed 30.8   02/03/2019 Imaging   1. Numerous retroperitoneal, iliac, and pelvic lymph nodes are stable or slightly decreased in size compared to immediate prior examination dated 11/27/2018 and significantly decreased in size compared to exam dated 09/20/2018.  2.  Unchanged thickening of the endometrium.  3. Other chronic, incidental, and postoperative findings as detailed above.   02/12/2019 Imaging   1. Uterus +/- tubes/ovaries, neoplastic, cervix, bilateral fallopian tubes and ovaries - MIXED HIGH-GRADE SEROUS AND ENDOMETRIAL ADENOCARCINOMA, 6.5 CM. SEE NOTE - CARCINOMA INVOLVES ENTIRE ENDOMETRIUM, ANTERIOR AND POSTERIOR LOWER UTERINE SEGMENTS AND ANTERIOR AND POSTERIOR CERVIX. - CARCINOMA INVADES FOR A DEPTH OF 1.9 CM WHERE MYOMETRIAL THICKNESS IS 2.0 CM (GREATER THAN 50% MYOMETRIAL INVASION) - CARCINOMA IS 1 MM FROM THE CERVICAL MARGIN - NEGATIVE FOR LYMPHOVASCULAR OR  PERINEURAL INVASION - BILATERAL UNREMARKABLE FALLOPIAN TUBES AND OVARIES, NEGATIVE FOR CARCINOMA - SEE ONCOLOGY TABLE 2. Lymph node, biopsy, right pelvic - METASTATIC CARCINOMA TO ONE OF TWO LYMPH NODES (1/2) Microscopic Comment 1. (v4.1.0.0) UTERUS, CARCINOMA OR CARCINOSARCOMA Procedure: Total hysterectomy with bilateral salpingo-oophorectomy Histologic type: Mixed high-grade serous and endometrioid adenocarcinoma Histologic Grade: NA Myometrial invasion: Present Depth of invasion: 19 mm Myometrial thickness: 20 mm Uterine Serosa Involvement: Not identified Cervical stromal involvement: Present Extent of involvement of other organs: NA Lymphovascular invasion: Not identified Regional Lymph Nodes: Examined: 0 Sentinel 2 Non-sentinel 2 Total Lymph nodes with metastasis: 1 Isolated tumor cells (< 0.2 mm): 0 Micrometastasis: (> 0.2 mm and < 2.0 mm): 1 Macrometastasis: (> 2.0 mm): 0 Extracapsular extension: Not identified Tumor block for ancillary studies: 18F MMR / MSI testing: Pending Pathologic Stage Classification (pTNM, AJCC 8th edition): pT2, pN48m FIGO Stage: IIIC1 Representative Tumor Block: 1A, 1C (v4.1.0.0) Diagnosis Note 1. Immunohistochemical stains for p16, p53, ER and Ki-67 show a pattern of staining consistent with mixed high-grade serous and endometrial adenocarcinoma. Dr. KLyndon Codehas reviewed this case and concurs with the above interpretation. A molecular study for microsatellite instability (MSI) and immunostains for MMR-related proteins are pending and will be reported in an addendum.   02/12/2019 Surgery   Pre-operative Diagnosis: endometrial cancer stage IIIC2, s/p neoadjuvant chemotherapy, extreme obesity (BMI 53kg/m2)  Operation: Robotic-assisted laparoscopic total hysterectomy with bilateral salpingoophorectomy, right pelvic lymphadenectomy. 22 modifier for extreme obesity  Surgeon: RDonaciano Eva Assistant Surgeon:  Lahoma Crocker  MD  Operative Findings:  : extreme obesity, BMI 53kg/m2, with significant retroperitoneal and intraperitoneal adiposity. Obesity necessitated an additional hour of operating time to create safe exposure. Obesity required additional personnel in the operating room for positioning and retraction. Severe obesity substantially increased the complexity of the procedure. 8cm uterus, grossly normal, normal appearing cervix. Retroperitoneal fibrosis consistent with nodal positivity. Clinically suspicious right pelvic lymph node. Unable to completely visualize retroperitoneal nodes due to extreme obesity.    02/12/2019 Pathology Results   IMMUNOHISTOCHEMICAL AND MORPHOMETRIC ANALYSIS PERFORMED MANUALLY The tumor cells are POSITIVE for Her2 (3+). Estrogen Receptor: 70%, POSITIVE, STRONG STAINING INTENSITY Progesterone Receptor: 70%, POSITIVE, STRONG STAINING INTENSITY Proliferation Marker Ki67: 20% REFERENCE RANGE ESTROGEN RECEPTOR NEGATIVE 0% POSITIVE =>1% REFERENCE RANGE PROGESTERONE RECEPTOR NEGATIVE 0% POSITIVE =>1% All controls stained appropriately  1. Uterus +/- tubes/ovaries, neoplastic, cervix, bilateral fallopian tubes and ovaries - MIXED HIGH-GRADE SEROUS AND ENDOMETRIAL ADENOCARCINOMA, 6.5 CM. SEE NOTE - CARCINOMA INVOLVES ENTIRE ENDOMETRIUM, ANTERIOR AND POSTERIOR LOWER UTERINE SEGMENTS AND ANTERIOR AND POSTERIOR CERVIX. - CARCINOMA INVADES FOR A DEPTH OF 1.9 CM WHERE MYOMETRIAL THICKNESS IS 2.0 CM (GREATER THAN 50% MYOMETRIAL INVASION) - CARCINOMA IS 1 MM FROM THE CERVICAL MARGIN - NEGATIVE FOR LYMPHOVASCULAR OR PERINEURAL INVASION - BILATERAL UNREMARKABLE FALLOPIAN TUBES AND OVARIES, NEGATIVE FOR CARCINOMA - SEE ONCOLOGY TABLE 2. Lymph node, biopsy, right pelvic - METASTATIC CARCINOMA TO ONE OF TWO LYMPH NODES (1/2) Microscopic Comment 1. (v4.1.0.0) UTERUS, CARCINOMA OR CARCINOSARCOMA Procedure: Total hysterectomy with bilateral salpingo-oophorectomy Histologic type: Mixed  high-grade serous and endometrioid adenocarcinoma Histologic Grade: NA Myometrial invasion: Present Depth of invasion: 19 mm Myometrial thickness: 20 mm Uterine Serosa Involvement: Not identified Cervical stromal involvement: Present Extent of involvement of other organs: NA Lymphovascular invasion: Not identified Regional Lymph Nodes: Examined: 0 Sentinel 2 Non-sentinel 2 Total Lymph nodes with metastasis: 1 Isolated tumor cells (< 0.2 mm): 0 Micrometastasis: (> 0.2 mm and < 2.0 mm): 1 Macrometastasis: (> 2.0 mm): 0 Extracapsular extension: Not identified Tumor block for ancillary studies: 62F MMR / MSI testing: Pending Pathologic Stage Classification (pTNM, AJCC 8th edition): pT2, pN37m FIGO Stage: IIIC1 Representative Tumor Block: 1A, 1C   03/17/2019 Echocardiogram   1. The left ventricle has normal systolic function, with an ejection fraction of 55-60%. The cavity size was normal. There is moderate concentric left ventricular hypertrophy. Left ventricular diastolic Doppler parameters are consistent with impaired relaxation.  2. The right ventricle has normal systolic function. The cavity was normal. There is no increase in right ventricular wall thickness.  3. GLS: -15.5%, no prior study available for comparison.   03/17/2019 Tumor Marker   Patient's tumor was tested for the following markers: CA-125 Results of the tumor marker test revealed 43   03/20/2019 -  Chemotherapy   The patient had trastuzumab for chemotherapy treatment.     04/30/2019 Tumor Marker   Patient's tumor was tested for the following markers:CA-125 Results of the tumor marker test revealed 29   05/20/2019 Imaging   1. No evidence of metastatic disease. 2. Small pelvic and left paracolic gutter ascites, new. 3. Aortic atherosclerosis (ICD10-170.0). Coronary artery calcification.     05/26/2019 Echocardiogram      1. The left ventricle has normal systolic function with an ejection fraction of 60-65%. The  cavity size was normal. There is mildly increased left ventricular wall thickness. Left ventricular diastolic parameters were normal.  2. GLS -16.4% (underestimated due to poor endocardial tracking).  3. The right ventricle has  normal systolic function. The cavity was normal. There is no increase in right ventricular wall thickness.  4. Mild calcification of the mitral valve leaflet.  5. Mild calcification of the aortic valve. No stenosis of the aortic valve.  6. The aorta is normal in size and structure.   09/02/2019 Imaging   No evidence of recurrent or metastatic carcinoma within the abdomen or pelvis.   Colonic diverticulosis. No radiographic evidence of diverticulitis.   Increased large stool burden noted; recommend clinical correlation for possible constipation.   09/02/2019 Echocardiogram   1. Left ventricular ejection fraction, by visual estimation, is 60 to 65%. The left ventricle has normal function. There is no left ventricular hypertrophy.  2. Global right ventricle has normal systolic function.The right ventricular size is normal. No increase in right ventricular wall thickness.  3. Left atrial size was normal.  4. Right atrial size was normal.  5. The mitral valve is normal in structure. No evidence of mitral valve regurgitation.  6. The tricuspid valve is normal in structure. Tricuspid valve regurgitation is trivial.  7. The aortic valve is normal in structure. Aortic valve regurgitation is not visualized.  8. The pulmonic valve was normal in structure. Pulmonic valve regurgitation is trivial.  9. Mildly elevated pulmonary artery systolic pressure. 10. The average left ventricular global longitudinal strain is -26.9 %.   09/03/2019 Tumor Marker   Patient's tumor was tested for the following markers: CA-125 Results of the tumor marker test revealed 13.5   11/05/2019 Tumor Marker   Patient's tumor was tested for the following markers: CA-125 Results of the tumor marker  test revealed 27.   11/25/2019 Imaging   1. New omental nodules suspicious for early peritoneal spread of malignancy. 2. New thickening along the right paracolic gutter concerning for potential tumor. 3. Other imaging findings of potential clinical significance: Mild cardiomegaly. Stable lateral segment left hepatic lobe hypodense lesions, likely cysts. Multilevel posterior decompression with multilevel foraminal impingement. 4. 4 mm left lower lobe pulmonary nodule not readily appreciable on the prior exam, could be inflammatory or neoplastic. Surveillance suggested. 5. Other imaging findings of potential clinical significance: Mild cardiomegaly. Suspected hepatic cysts. Multilevel lumbar impingement.   Aortic Atherosclerosis (ICD10-I70.0).   11/26/2019 Tumor Marker   Patient's tumor was tested for the following markers: CA-125 Results of the tumor marker test revealed 129   12/11/2019 -  Chemotherapy   The patient had carboplatin and taxol for chemotherapy treatment.     01/01/2020 Tumor Marker   Patient's tumor was tested for the following markers: CA-125 Results of the tumor marker test revealed 123   01/22/2020 Tumor Marker   Patient's tumor was tested for the following markers: CA-125 Results of the tumor marker test revealed 109.   02/11/2020 Imaging   1. Interval worsening of disease as evidenced by new and enlarging peritoneal and omental nodularity. 2. Slight enlargement of retroperitoneal lymph nodes. 3. New small area of low attenuation along the medial margin of the spleen, potentially small capsular implant not seen on the previous study.   Aortic Atherosclerosis (ICD10-I70.0).     02/19/2020 -  Chemotherapy   The patient had pembrolizumab plus Lenvatinib for chemotherapy treatment.     02/19/2020 Tumor Marker   Patient's tumor was tested for the following markers: CA-125 Results of the tumor marker test revealed 308     REVIEW OF SYSTEMS:   Constitutional: Denies  fevers, chills or abnormal weight loss Eyes: Denies blurriness of vision Ears, nose, mouth, throat, and  face: Denies mucositis or sore throat Respiratory: Denies cough, dyspnea or wheezes Cardiovascular: Denies palpitation, chest discomfort or lower extremity swelling Gastrointestinal:  Denies nausea, heartburn or change in bowel habits Lymphatics: Denies new lymphadenopathy or easy bruising Neurological:Denies numbness, tingling or new weaknesses Behavioral/Psych: Mood is stable, no new changes  All other systems were reviewed with the patient and are negative.  I have reviewed the past medical history, past surgical history, social history and family history with the patient and they are unchanged from previous note.  ALLERGIES:  is allergic to tegaderm ag mesh [silver].  MEDICATIONS:  Current Outpatient Medications  Medication Sig Dispense Refill  . diphenhydrAMINE (BENADRYL) 25 mg capsule Take 100 mg by mouth 2 (two) times a day.    . furosemide (LASIX) 20 MG tablet Take 1 tablet (20 mg total) by mouth daily. 30 tablet 11  . gabapentin (NEURONTIN) 400 MG capsule Take 3 capsules (1,200 mg total) by mouth 3 (three) times daily. 270 capsule 11  . lenvatinib 20 mg daily dose (LENVIMA) 2 x 10 MG capsule Take 2 capsules (20 mg total) by mouth daily. 60 capsule 11  . lidocaine-prilocaine (EMLA) cream Apply to affected area once 30 g 3  . loratadine (CLARITIN) 10 MG tablet Take 10 mg by mouth daily.    Marland Kitchen losartan-hydrochlorothiazide (HYZAAR) 50-12.5 MG tablet Take 2 tablets by mouth daily.    . methadone (DOLOPHINE) 10 MG tablet Take 2 tablets (20 mg total) by mouth every 12 (twelve) hours. 90 tablet 0  . metoprolol tartrate (LOPRESSOR) 25 MG tablet Take 1 tablet (25 mg total) by mouth 2 (two) times daily. 60 tablet 3  . ondansetron (ZOFRAN) 8 MG tablet Take 8 mg by mouth every 8 (eight) hours as needed.    Marland Kitchen oxyCODONE-acetaminophen (PERCOCET) 10-325 MG tablet Take 1-2 tablets by mouth every  6 (six) hours as needed for pain.    Marland Kitchen prochlorperazine (COMPAZINE) 10 MG tablet TAKE 1 TABLET BY MOUTH EVERY 6 HOURS AS NEEDED FOR NAUSEA OR VOMITING 30 tablet 1  . ranitidine (ZANTAC) 150 MG capsule Take 150 mg by mouth 2 (two) times daily.    . rivaroxaban (XARELTO) 20 MG TABS tablet Take 1 tablet (20 mg total) by mouth daily with supper. 30 tablet 9   No current facility-administered medications for this visit.    PHYSICAL EXAMINATION: ECOG PERFORMANCE STATUS: 1 - Symptomatic but completely ambulatory  Vitals:   02/26/20 0802  BP: (!) 167/82  Pulse: 80  Resp: 18  Temp: 97.8 F (36.6 C)  SpO2: 98%   Filed Weights   02/26/20 0802  Weight: 285 lb 6.4 oz (129.5 kg)    GENERAL:alert, no distress and comfortable SKIN: Noted very minimum skin rash on her hands NEURO: alert & oriented x 3 with fluent speech, no focal motor/sensory deficits  LABORATORY DATA:  I have reviewed the data as listed    Component Value Date/Time   NA 140 02/26/2020 0835   K 3.2 (L) 02/26/2020 0835   CL 98 02/26/2020 0835   CO2 30 02/26/2020 0835   GLUCOSE 121 (H) 02/26/2020 0835   BUN 12 02/26/2020 0835   CREATININE 0.76 02/26/2020 0835   CALCIUM 9.2 02/26/2020 0835   PROT 7.1 02/26/2020 0835   ALBUMIN 3.7 02/26/2020 0835   AST 21 02/26/2020 0835   ALT 17 02/26/2020 0835   ALKPHOS 111 02/26/2020 0835   BILITOT 0.5 02/26/2020 0835   GFRNONAA >60 02/26/2020 0835   GFRAA >60 02/26/2020 2130  No results found for: SPEP, UPEP  Lab Results  Component Value Date   WBC 3.6 (L) 02/26/2020   NEUTROABS 1.9 02/26/2020   HGB 11.5 (L) 02/26/2020   HCT 34.7 (L) 02/26/2020   MCV 103.0 (H) 02/26/2020   PLT 147 (L) 02/26/2020      Chemistry      Component Value Date/Time   NA 140 02/26/2020 0835   K 3.2 (L) 02/26/2020 0835   CL 98 02/26/2020 0835   CO2 30 02/26/2020 0835   BUN 12 02/26/2020 0835   CREATININE 0.76 02/26/2020 0835      Component Value Date/Time   CALCIUM 9.2 02/26/2020  0835   ALKPHOS 111 02/26/2020 0835   AST 21 02/26/2020 0835   ALT 17 02/26/2020 0835   BILITOT 0.5 02/26/2020 7308

## 2020-02-26 NOTE — Telephone Encounter (Signed)
Called and given below message. She verbalized understanding. 

## 2020-02-26 NOTE — Assessment & Plan Note (Signed)
Her pain is well controlled She will continue methadone along with breakthrough pain medicine as needed

## 2020-02-27 LAB — T4: T4, Total: 8.4 ug/dL (ref 4.5–12.0)

## 2020-03-03 ENCOUNTER — Telehealth: Payer: Self-pay

## 2020-03-03 NOTE — Telephone Encounter (Signed)
-----   Message from Heath Lark, MD sent at 03/03/2020  8:33 AM EDT ----- Regarding: what is the status of her BP at home?

## 2020-03-03 NOTE — Telephone Encounter (Signed)
Called and given below message. She verbalized understanding. 

## 2020-03-03 NOTE — Telephone Encounter (Signed)
Called and given below message. She verbalized understanding.  5/15 am bp 169/96, pulse 54, did not recheck in pm 5/16 am bp 159/72, p- 57 and pm 147/86, p- 60 5/17 am bp-180/72, p-49 and pm 152/76, p-55 5/18 am bp-148/73, p-43 and pm 129/92, p-50 5/19 am 133/72, p-44 and pm 153/77, p- 55 5/20 am 144/72 and pulse 47

## 2020-03-03 NOTE — Telephone Encounter (Signed)
Much better No changes to BP meds Instruct her to call if SBP > 150 consistently

## 2020-03-07 NOTE — Progress Notes (Signed)
Pharmacist Chemotherapy Monitoring - Follow Up Assessment    I verify that I have reviewed each item in the below checklist:  . Regimen for the patient is scheduled for the appropriate day and plan matches scheduled date. Marland Kitchen Appropriate non-routine labs are ordered dependent on drug ordered. . If applicable, additional medications reviewed and ordered per protocol based on lifetime cumulative doses and/or treatment regimen.   Plan for follow-up and/or issues identified: No . I-vent associated with next due treatment: No . MD and/or nursing notified: No  Jemmie Ledgerwood D 03/07/2020 9:27 AM

## 2020-03-11 ENCOUNTER — Inpatient Hospital Stay: Payer: Medicare Other

## 2020-03-11 ENCOUNTER — Other Ambulatory Visit: Payer: Self-pay | Admitting: Hematology and Oncology

## 2020-03-11 ENCOUNTER — Inpatient Hospital Stay: Payer: Medicare Other | Admitting: Hematology and Oncology

## 2020-03-11 ENCOUNTER — Encounter: Payer: Self-pay | Admitting: Hematology and Oncology

## 2020-03-11 ENCOUNTER — Other Ambulatory Visit: Payer: Self-pay

## 2020-03-11 DIAGNOSIS — D61818 Other pancytopenia: Secondary | ICD-10-CM

## 2020-03-11 DIAGNOSIS — C541 Malignant neoplasm of endometrium: Secondary | ICD-10-CM

## 2020-03-11 DIAGNOSIS — I10 Essential (primary) hypertension: Secondary | ICD-10-CM | POA: Diagnosis not present

## 2020-03-11 DIAGNOSIS — Z5112 Encounter for antineoplastic immunotherapy: Secondary | ICD-10-CM | POA: Diagnosis not present

## 2020-03-11 DIAGNOSIS — E876 Hypokalemia: Secondary | ICD-10-CM | POA: Diagnosis not present

## 2020-03-11 DIAGNOSIS — G893 Neoplasm related pain (acute) (chronic): Secondary | ICD-10-CM

## 2020-03-11 DIAGNOSIS — Z7189 Other specified counseling: Secondary | ICD-10-CM

## 2020-03-11 DIAGNOSIS — R001 Bradycardia, unspecified: Secondary | ICD-10-CM | POA: Diagnosis not present

## 2020-03-11 LAB — CMP (CANCER CENTER ONLY)
ALT: 13 U/L (ref 0–44)
AST: 18 U/L (ref 15–41)
Albumin: 3.6 g/dL (ref 3.5–5.0)
Alkaline Phosphatase: 107 U/L (ref 38–126)
Anion gap: 12 (ref 5–15)
BUN: 12 mg/dL (ref 8–23)
CO2: 31 mmol/L (ref 22–32)
Calcium: 9.1 mg/dL (ref 8.9–10.3)
Chloride: 99 mmol/L (ref 98–111)
Creatinine: 0.79 mg/dL (ref 0.44–1.00)
GFR, Est AFR Am: >60 mL/min (ref >60)
GFR, Estimated: >60 mL/min (ref >60)
Glucose, Bld: 127 mg/dL — ABNORMAL HIGH (ref 70–99)
Potassium: 2.9 mmol/L — CL (ref 3.5–5.1)
Sodium: 142 mmol/L (ref 135–145)
Total Bilirubin: 0.6 mg/dL (ref 0.3–1.2)
Total Protein: 6.9 g/dL (ref 6.5–8.1)

## 2020-03-11 LAB — CBC WITH DIFFERENTIAL (CANCER CENTER ONLY)
Abs Immature Granulocytes: 0.01 10*3/uL (ref 0.00–0.07)
Basophils Absolute: 0 10*3/uL (ref 0.0–0.1)
Basophils Relative: 0 %
Eosinophils Absolute: 0 10*3/uL (ref 0.0–0.5)
Eosinophils Relative: 1 %
HCT: 35.7 % — ABNORMAL LOW (ref 36.0–46.0)
Hemoglobin: 11.9 g/dL — ABNORMAL LOW (ref 12.0–15.0)
Immature Granulocytes: 0 %
Lymphocytes Relative: 37 %
Lymphs Abs: 1.4 10*3/uL (ref 0.7–4.0)
MCH: 35.4 pg — ABNORMAL HIGH (ref 26.0–34.0)
MCHC: 33.3 g/dL (ref 30.0–36.0)
MCV: 106.3 fL — ABNORMAL HIGH (ref 80.0–100.0)
Monocytes Absolute: 0.4 10*3/uL (ref 0.1–1.0)
Monocytes Relative: 11 %
Neutro Abs: 2 10*3/uL (ref 1.7–7.7)
Neutrophils Relative %: 51 %
Platelet Count: 146 10*3/uL — ABNORMAL LOW (ref 150–400)
RBC: 3.36 MIL/uL — ABNORMAL LOW (ref 3.87–5.11)
RDW: 18.1 % — ABNORMAL HIGH (ref 11.5–15.5)
WBC Count: 3.9 10*3/uL — ABNORMAL LOW (ref 4.0–10.5)
nRBC: 0 % (ref 0.0–0.2)

## 2020-03-11 LAB — TOTAL PROTEIN, URINE DIPSTICK: Protein, ur: NEGATIVE mg/dL

## 2020-03-11 LAB — TSH: TSH: 4.104 u[IU]/mL — ABNORMAL HIGH (ref 0.308–3.960)

## 2020-03-11 LAB — MAGNESIUM: Magnesium: 1.2 mg/dL — CL (ref 1.7–2.4)

## 2020-03-11 MED ORDER — MAGNESIUM OXIDE 400 (241.3 MG) MG PO TABS
400.0000 mg | ORAL_TABLET | Freq: Every day | ORAL | 3 refills | Status: DC
Start: 2020-03-11 — End: 2020-06-30

## 2020-03-11 MED ORDER — HEPARIN SOD (PORK) LOCK FLUSH 100 UNIT/ML IV SOLN
500.0000 [IU] | Freq: Once | INTRAVENOUS | Status: AC | PRN
  Administered 2020-03-11: 500 [IU]
  Filled 2020-03-11: qty 5

## 2020-03-11 MED ORDER — METOPROLOL TARTRATE 25 MG PO TABS: 12.5000 mg | ORAL_TABLET | Freq: Two times a day (BID) | ORAL | 3 refills | Status: DC

## 2020-03-11 MED ORDER — SODIUM CHLORIDE 0.9% FLUSH
10.0000 mL | Freq: Once | INTRAVENOUS | Status: AC
  Administered 2020-03-11: 10 mL
  Filled 2020-03-11: qty 10

## 2020-03-11 MED ORDER — METHADONE HCL 10 MG PO TABS: 20.0000 mg | ORAL_TABLET | Freq: Two times a day (BID) | ORAL | 0 refills | Status: DC

## 2020-03-11 MED ORDER — MAGNESIUM SULFATE 4 GM/100ML IV SOLN
4.0000 g | Freq: Once | INTRAVENOUS | Status: AC
  Administered 2020-03-11: 4 g via INTRAVENOUS
  Filled 2020-03-11: qty 100

## 2020-03-11 MED ORDER — SODIUM CHLORIDE 0.9% FLUSH
10.0000 mL | INTRAVENOUS | Status: DC | PRN
  Administered 2020-03-11: 10 mL
  Filled 2020-03-11: qty 10

## 2020-03-11 MED ORDER — MAGNESIUM SULFATE 2 GM/50ML IV SOLN
INTRAVENOUS | Status: AC
  Filled 2020-03-11: qty 100

## 2020-03-11 MED ORDER — SODIUM CHLORIDE 0.9 % IV SOLN
Freq: Once | INTRAVENOUS | Status: AC
  Filled 2020-03-11: qty 250

## 2020-03-11 MED ORDER — SODIUM CHLORIDE 0.9 % IV SOLN
200.0000 mg | Freq: Once | INTRAVENOUS | Status: AC
  Administered 2020-03-11: 200 mg via INTRAVENOUS
  Filled 2020-03-11: qty 8

## 2020-03-11 NOTE — Assessment & Plan Note (Signed)
Her magnesium level is low, could be related to her diuretic therapy and prior chemotherapy causing magnesium wasting I recommend IV magnesium today followed by oral magnesium replacement therapy

## 2020-03-11 NOTE — Assessment & Plan Note (Signed)
She has multiple different side effects from treatment with electrolyte imbalances, hypertension and bradycardia We will proceed with treatment today I will make medication adjustment to her beta-blocker EKG today satisfactory We will correct hypomagnesemia I will call her next week for further follow-up She needs minimum 4 cycles of pembrolizumab before repeat imaging study

## 2020-03-11 NOTE — Progress Notes (Signed)
[facility] OFFICE PROGRESS NOTE  Patient Care Team: Ronita Hipps, MD as PCP - General (Family Medicine) Nicholaus Bloom, MD (Anesthesiology)  ASSESSMENT & PLAN:  Endometrial cancer [facility]) She has multiple different side effects from treatment with electrolyte imbalances, hypertension and bradycardia We will proceed with treatment today I will make medication adjustment to her beta-blocker EKG today satisfactory We will correct hypomagnesemia I will call her next week for further follow-up She needs minimum 4 cycles of pembrolizumab before repeat imaging study  Pancytopenia, acquired [facility]) This is likely due to recent side effects of treatment She is not symptomatic Observe only for now  Hypomagnesemia Her magnesium level is low, could be related to her diuretic therapy and prior chemotherapy causing magnesium wasting I recommend IV magnesium today followed by oral magnesium replacement therapy  Cancer associated pain Her pain is reasonably controlled with combination of methadone along with Percocet as needed I refill her prescription today Her EKG showed no evidence of QTC prolongation  Essential hypertension Her blood pressure is intermittently elevated She has no signs of proteinuria She is noted to have severe bradycardia I recommend reducing metoprolol to half the dose She remained bradycardic with her heart rate monitoring next week, we will discontinue metoprolol altogether   No orders of the defined types were placed in this encounter.   All questions were answered. The patient knows to call the clinic with any problems, questions or concerns. The total time spent in the appointment was 30 minutes encounter with patients including review of chart and various tests results, discussions about plan of care and coordination of care plan   Heath Lark, MD 03/11/2020 4:00 PM  INTERVAL HISTORY: Please see below for problem oriented charting. She returns  for cycle #2 of treatment She brought with her blood pressure monitoring over the past week Her evening blood pressure seems to be mildly elevated She is noted to be bradycardic with her heart rate as low as 44 She is not symptomatic with dizziness, chest pain but does have shortness of breath on exertion Her pain control is stable.  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 A Pap was done 09/02/18 showign ASC-H. She was referred onto GYN and seen by Dr. Paula Compton    09/09/2018 Procedure   She underwent endometrial biopsy   09/12/2018 Pathology Results   Endometrial biopsy positive for adenocarcinoma   09/22/2018 Imaging   Ct abdomen and pelvis showed: Diffuse endometrial thickening, consistent with primary endometrial carcinoma.  Pelvic and abdominal retroperitoneal and retrocrural lymphadenopathy, consistent with metastatic disease.  Moderate hepatic steatosis.   09/25/2018 Cancer Staging   Staging form: Corpus Uteri - Carcinoma and Carcinosarcoma, AJCC 8th Edition - Pathologic: Stage IIIC2 (pT2, pN2, cM0) - Signed by Heath Lark, MD on 02/23/2019   10/01/2018 Procedure   Successful placement of a right IJ approach Power Port with ultrasound and fluoroscopic guidance. The catheter is ready for use.   10/02/2018 Tumor Marker   Patient's tumor was tested for the following markers: CA-125 Results of the tumor marker test revealed 578   10/03/2018 - 01/16/2019 Chemotherapy   The patient  had carboplatin and Taxol with dose adjustment due to neuropathy x 6 cycles   10/24/2018 Tumor Marker   Patient's tumor  was tested for the following markers: CA-125 Results of the tumor marker test revealed 296    Genetic Testing   Patient has genetic testing done for MSI/MMR. Results revealed MSI stable and MMR normal on Accession [id number].   11/14/2018 Tumor Marker   Patient's tumor was tested for the following markers: CA-125 Results of the tumor marker test revealed 81   11/27/2018 Imaging   1. Significant decrease in abdominal retroperitoneal and bilateral iliac lymphadenopathy since previous study. 2. Significant decrease in abnormal endometrial soft tissue density since prior study. 3. No new or progressive metastatic disease identified.   12/05/2018 Tumor Marker   Patient's tumor was tested for the following markers: CA-125 Results of the tumor marker test revealed 46.3   12/26/2018 Tumor Marker   Patient's tumor was tested for the following markers: CA-125 Results of the tumor marker test revealed 38.2   01/16/2019 Tumor Marker   Patient's tumor was tested for the following markers: CA-125 Results of the tumor marker test revealed 30.8   02/03/2019 Imaging   1. Numerous retroperitoneal, iliac, and pelvic lymph nodes are stable or slightly decreased in size compared to immediate prior examination dated 11/27/2018 and significantly decreased in size compared to exam dated 09/20/2018.  2.  Unchanged thickening of the endometrium.  3. Other chronic, incidental, and postoperative findings as detailed above.   02/12/2019 Imaging   1. Uterus +/- tubes/ovaries, neoplastic, cervix, bilateral fallopian tubes and ovaries - MIXED HIGH-GRADE SEROUS AND ENDOMETRIAL ADENOCARCINOMA, 6.5 CM. SEE NOTE - CARCINOMA INVOLVES ENTIRE ENDOMETRIUM, ANTERIOR AND POSTERIOR LOWER UTERINE SEGMENTS AND ANTERIOR AND POSTERIOR CERVIX. - CARCINOMA INVADES FOR A DEPTH OF 1.9 CM WHERE MYOMETRIAL THICKNESS IS 2.0 CM (GREATER THAN 50% MYOMETRIAL INVASION) - CARCINOMA IS 1 MM FROM THE CERVICAL MARGIN - NEGATIVE FOR  LYMPHOVASCULAR OR PERINEURAL INVASION - BILATERAL UNREMARKABLE FALLOPIAN TUBES AND OVARIES, NEGATIVE FOR CARCINOMA - SEE ONCOLOGY TABLE 2. Lymph node, biopsy, right pelvic - METASTATIC CARCINOMA TO ONE OF TWO LYMPH NODES (1/2) Microscopic Comment 1. (v4.1.0.0) UTERUS, CARCINOMA OR CARCINOSARCOMA Procedure: Total hysterectomy with bilateral salpingo-oophorectomy Histologic type: Mixed high-grade serous and endometrioid adenocarcinoma Histologic Grade: NA Myometrial invasion: Present Depth of invasion: 19 mm Myometrial thickness: 20 mm Uterine Serosa Involvement: Not identified Cervical stromal involvement: Present Extent of involvement of other organs: NA Lymphovascular invasion: Not identified Regional Lymph Nodes: Examined: 0 Sentinel 2 Non-sentinel 2 Total Lymph nodes with metastasis: 1 Isolated tumor cells (< 0.2 mm): 0 Micrometastasis: (> 0.2 mm and < 2.0 mm): 1 Macrometastasis: (> 2.0 mm): 0 Extracapsular extension: Not identified Tumor block for ancillary studies: 77F MMR / MSI testing: Pending Pathologic Stage Classification (pTNM, AJCC 8th edition): pT2, pN31m FIGO Stage: IIIC1 Representative Tumor Block: 1A, 1C (v4.1.0.0) Diagnosis Note 1. Immunohistochemical stains for p16, p53, ER and Ki-67 show a pattern of staining consistent with mixed high-grade serous and endometrial adenocarcinoma. Dr. KLyndon Codehas reviewed this case and concurs with the above interpretation. A molecular study for microsatellite instability (MSI) and immunostains for MMR-related proteins are pending and will be reported in an addendum.   02/12/2019 Surgery   Pre-operative Diagnosis: endometrial cancer stage IIIC2, s/p neoadjuvant chemotherapy, extreme obesity (BMI 53kg/m2)  Operation: Robotic-assisted laparoscopic total hysterectomy with bilateral salpingoophorectomy, right pelvic lymphadenectomy. 22 modifier for extreme obesity  Surgeon: RDonaciano Eva Assistant Surgeon: LLahoma CrockerMD  Operative Findings:  :  extreme obesity, BMI 53kg/m2, with significant retroperitoneal and intraperitoneal adiposity. Obesity necessitated an additional hour of operating time to create safe exposure. Obesity required additional personnel in the operating room for positioning and retraction. Severe obesity substantially increased the complexity of the procedure. 8cm uterus, grossly normal, normal appearing cervix. Retroperitoneal fibrosis consistent with nodal positivity. Clinically suspicious right pelvic lymph node. Unable to completely visualize retroperitoneal nodes due to extreme obesity.    02/12/2019 Pathology Results   IMMUNOHISTOCHEMICAL AND MORPHOMETRIC ANALYSIS PERFORMED MANUALLY The tumor cells are POSITIVE for Her2 (3+). Estrogen Receptor: 70%, POSITIVE, STRONG STAINING INTENSITY Progesterone Receptor: 70%, POSITIVE, STRONG STAINING INTENSITY Proliferation Marker Ki67: 20% REFERENCE RANGE ESTROGEN RECEPTOR NEGATIVE 0% POSITIVE =>1% REFERENCE RANGE PROGESTERONE RECEPTOR NEGATIVE 0% POSITIVE =>1% All controls stained appropriately  1. Uterus +/- tubes/ovaries, neoplastic, cervix, bilateral fallopian tubes and ovaries - MIXED HIGH-GRADE SEROUS AND ENDOMETRIAL ADENOCARCINOMA, 6.5 CM. SEE NOTE - CARCINOMA INVOLVES ENTIRE ENDOMETRIUM, ANTERIOR AND POSTERIOR LOWER UTERINE SEGMENTS AND ANTERIOR AND POSTERIOR CERVIX. - CARCINOMA INVADES FOR A DEPTH OF 1.9 CM WHERE MYOMETRIAL THICKNESS IS 2.0 CM (GREATER THAN 50% MYOMETRIAL INVASION) - CARCINOMA IS 1 MM FROM THE CERVICAL MARGIN - NEGATIVE FOR LYMPHOVASCULAR OR PERINEURAL INVASION - BILATERAL UNREMARKABLE FALLOPIAN TUBES AND OVARIES, NEGATIVE FOR CARCINOMA - SEE ONCOLOGY TABLE 2. Lymph node, biopsy, right pelvic - METASTATIC CARCINOMA TO ONE OF TWO LYMPH NODES (1/2) Microscopic Comment 1. (v4.1.0.0) UTERUS, CARCINOMA OR CARCINOSARCOMA Procedure: Total hysterectomy with bilateral salpingo-oophorectomy Histologic type: Mixed  high-grade serous and endometrioid adenocarcinoma Histologic Grade: NA Myometrial invasion: Present Depth of invasion: 19 mm Myometrial thickness: 20 mm Uterine Serosa Involvement: Not identified Cervical stromal involvement: Present Extent of involvement of other organs: NA Lymphovascular invasion: Not identified Regional Lymph Nodes: Examined: 0 Sentinel 2 Non-sentinel 2 Total Lymph nodes with metastasis: 1 Isolated tumor cells (< 0.2 mm): 0 Micrometastasis: (> 0.2 mm and < 2.0 mm): 1 Macrometastasis: (> 2.0 mm): 0 Extracapsular extension: Not identified Tumor block for ancillary studies: 40F MMR / MSI testing: Pending Pathologic Stage Classification (pTNM, AJCC 8th edition): pT2, pN38m FIGO Stage: IIIC1 Representative Tumor Block: 1A, 1C   03/17/2019 Echocardiogram   1. The left ventricle has normal systolic function, with an ejection fraction of 55-60%. The cavity size was normal. There is moderate concentric left ventricular hypertrophy. Left ventricular diastolic Doppler parameters are consistent with impaired relaxation.  2. The right ventricle has normal systolic function. The cavity was normal. There is no increase in right ventricular wall thickness.  3. GLS: -15.5%, no prior study available for comparison.   03/17/2019 Tumor Marker   Patient's tumor was tested for the following markers: CA-125 Results of the tumor marker test revealed 43   03/20/2019 -  Chemotherapy   The patient had trastuzumab for chemotherapy treatment.     04/30/2019 Tumor Marker   Patient's tumor was tested for the following markers:CA-125 Results of the tumor marker test revealed 29   05/20/2019 Imaging   1. No evidence of metastatic disease. 2. Small pelvic and left paracolic gutter ascites, new. 3. Aortic atherosclerosis (ICD10-170.0). Coronary artery calcification.     05/26/2019 Echocardiogram      1. The left ventricle has normal systolic function with an ejection fraction of 60-65%. The  cavity size was normal. There is mildly increased left ventricular wall thickness. Left ventricular diastolic parameters were normal.  2. GLS -16.4% (underestimated due to poor endocardial tracking).  3. The right ventricle has normal systolic function. The cavity was normal. There is no  increase in right ventricular wall thickness.  4. Mild calcification of the mitral valve leaflet.  5. Mild calcification of the aortic valve. No stenosis of the aortic valve.  6. The aorta is normal in size and structure.   09/02/2019 Imaging   No evidence of recurrent or metastatic carcinoma within the abdomen or pelvis.   Colonic diverticulosis. No radiographic evidence of diverticulitis.   Increased large stool burden noted; recommend clinical correlation for possible constipation.   09/02/2019 Echocardiogram   1. Left ventricular ejection fraction, by visual estimation, is 60 to 65%. The left ventricle has normal function. There is no left ventricular hypertrophy.  2. Global right ventricle has normal systolic function.The right ventricular size is normal. No increase in right ventricular wall thickness.  3. Left atrial size was normal.  4. Right atrial size was normal.  5. The mitral valve is normal in structure. No evidence of mitral valve regurgitation.  6. The tricuspid valve is normal in structure. Tricuspid valve regurgitation is trivial.  7. The aortic valve is normal in structure. Aortic valve regurgitation is not visualized.  8. The pulmonic valve was normal in structure. Pulmonic valve regurgitation is trivial.  9. Mildly elevated pulmonary artery systolic pressure. 10. The average left ventricular global longitudinal strain is -26.9 %.   09/03/2019 Tumor Marker   Patient's tumor was tested for the following markers: CA-125 Results of the tumor marker test revealed 13.5   11/05/2019 Tumor Marker   Patient's tumor was tested for the following markers: CA-125 Results of the tumor marker  test revealed 27.   11/25/2019 Imaging   1. New omental nodules suspicious for early peritoneal spread of malignancy. 2. New thickening along the right paracolic gutter concerning for potential tumor. 3. Other imaging findings of potential clinical significance: Mild cardiomegaly. Stable lateral segment left hepatic lobe hypodense lesions, likely cysts. Multilevel posterior decompression with multilevel foraminal impingement. 4. 4 mm left lower lobe pulmonary nodule not readily appreciable on the prior exam, could be inflammatory or neoplastic. Surveillance suggested. 5. Other imaging findings of potential clinical significance: Mild cardiomegaly. Suspected hepatic cysts. Multilevel lumbar impingement.   Aortic Atherosclerosis (ICD10-I70.0).   11/26/2019 Tumor Marker   Patient's tumor was tested for the following markers: CA-125 Results of the tumor marker test revealed 129   12/11/2019 -  Chemotherapy   The patient had carboplatin and taxol for chemotherapy treatment.     01/01/2020 Tumor Marker   Patient's tumor was tested for the following markers: CA-125 Results of the tumor marker test revealed 123   01/22/2020 Tumor Marker   Patient's tumor was tested for the following markers: CA-125 Results of the tumor marker test revealed 109.   02/11/2020 Imaging   1. Interval worsening of disease as evidenced by new and enlarging peritoneal and omental nodularity. 2. Slight enlargement of retroperitoneal lymph nodes. 3. New small area of low attenuation along the medial margin of the spleen, potentially small capsular implant not seen on the previous study.   Aortic Atherosclerosis (ICD10-I70.0).     02/19/2020 -  Chemotherapy   The patient had pembrolizumab plus Lenvatinib for chemotherapy treatment.     02/19/2020 Tumor Marker   Patient's tumor was tested for the following markers: CA-125 Results of the tumor marker test revealed 308     REVIEW OF SYSTEMS:   Constitutional: Denies  fevers, chills or abnormal weight loss Eyes: Denies blurriness of vision Ears, nose, mouth, throat, and face: Denies mucositis or sore throat Respiratory: Denies cough, dyspnea  or wheezes Cardiovascular: Denies palpitation, chest discomfort or lower extremity swelling Gastrointestinal:  Denies nausea, heartburn or change in bowel habits Skin: Denies abnormal skin rashes Lymphatics: Denies new lymphadenopathy or easy bruising Neurological:Denies numbness, tingling or new weaknesses Behavioral/Psych: Mood is stable, no new changes  All other systems were reviewed with the patient and are negative.  I have reviewed the past medical history, past surgical history, social history and family history with the patient and they are unchanged from previous note.  ALLERGIES:  is allergic to tegaderm ag mesh [silver].  MEDICATIONS:  Current Outpatient Medications  Medication Sig Dispense Refill  . diphenhydrAMINE (BENADRYL) 25 mg capsule Take 100 mg by mouth 2 (two) times a day.    . furosemide (LASIX) 20 MG tablet Take 1 tablet (20 mg total) by mouth daily. 30 tablet 11  . gabapentin (NEURONTIN) 400 MG capsule Take 3 capsules (1,200 mg total) by mouth 3 (three) times daily. 270 capsule 11  . lenvatinib 20 mg daily dose (LENVIMA) 2 x 10 MG capsule Take 2 capsules (20 mg total) by mouth daily. 60 capsule 11  . lidocaine-prilocaine (EMLA) cream Apply to affected area once 30 g 3  . loratadine (CLARITIN) 10 MG tablet Take 10 mg by mouth daily.    Marland Kitchen losartan-hydrochlorothiazide (HYZAAR) 50-12.5 MG tablet Take 2 tablets by mouth daily.    . magnesium oxide (MAG-OX) 400 (241.3 Mg) MG tablet Take 1 tablet (400 mg total) by mouth daily. 30 tablet 3  . methadone (DOLOPHINE) 10 MG tablet Take 2 tablets (20 mg total) by mouth every 12 (twelve) hours. 90 tablet 0  . metoprolol tartrate (LOPRESSOR) 25 MG tablet Take 0.5 tablets (12.5 mg total) by mouth 2 (two) times daily. 60 tablet 3  . ondansetron (ZOFRAN) 8  MG tablet Take 8 mg by mouth every 8 (eight) hours as needed.    Marland Kitchen oxyCODONE-acetaminophen (PERCOCET) 10-325 MG tablet Take 1-2 tablets by mouth every 6 (six) hours as needed for pain.    Marland Kitchen prochlorperazine (COMPAZINE) 10 MG tablet TAKE 1 TABLET BY MOUTH EVERY 6 HOURS AS NEEDED FOR NAUSEA OR VOMITING 30 tablet 1  . ranitidine (ZANTAC) 150 MG capsule Take 150 mg by mouth 2 (two) times daily.    . rivaroxaban (XARELTO) 20 MG TABS tablet Take 1 tablet (20 mg total) by mouth daily with supper. 30 tablet 9   No current facility-administered medications for this visit.   Facility-Administered Medications Ordered in Other Visits  Medication Dose Route Frequency Provider Last Rate Last Admin  . sodium chloride flush (NS) 0.9 % injection 10 mL  10 mL Intracatheter PRN Alvy Bimler, Shahid Flori, MD   10 mL at 03/11/20 1521    PHYSICAL EXAMINATION: ECOG PERFORMANCE STATUS: 2 - Symptomatic, <50% confined to bed  Vitals:   03/11/20 1055  BP: (!) 176/62  Pulse: (!) 52  Resp: 18  Temp: 98.2 F (36.8 C)  SpO2: 97%   Filed Weights   03/11/20 1055  Weight: 280 lb 12.8 oz (127.4 kg)    GENERAL:alert, no distress and comfortable SKIN: skin color, texture, turgor are normal, no rashes or significant lesions EYES: normal, Conjunctiva are pink and non-injected, sclera clear OROPHARYNX:no exudate, no erythema and lips, buccal mucosa, and tongue normal  NECK: supple, thyroid normal size, non-tender, without nodularity LYMPH:  no palpable lymphadenopathy in the cervical, axillary or inguinal LUNGS: clear to auscultation and percussion with normal breathing effort HEART: regular rate & rhythm and no murmurs and no lower extremity edema  ABDOMEN:abdomen soft, non-tender and normal bowel sounds Musculoskeletal:no cyanosis of digits and no clubbing  NEURO: alert & oriented x 3 with fluent speech, no focal motor/sensory deficits  LABORATORY DATA:  I have reviewed the data as listed    Component Value Date/Time   NA  142 03/11/2020 1010   K 2.9 (LL) 03/11/2020 1010   CL 99 03/11/2020 1010   CO2 31 03/11/2020 1010   GLUCOSE 127 (H) 03/11/2020 1010   BUN 12 03/11/2020 1010   CREATININE 0.79 03/11/2020 1010   CALCIUM 9.1 03/11/2020 1010   PROT 6.9 03/11/2020 1010   ALBUMIN 3.6 03/11/2020 1010   AST 18 03/11/2020 1010   ALT 13 03/11/2020 1010   ALKPHOS 107 03/11/2020 1010   BILITOT 0.6 03/11/2020 1010   GFRNONAA >60 03/11/2020 1010   GFRAA >60 03/11/2020 1010    No results found for: SPEP, UPEP  Lab Results  Component Value Date   WBC 3.9 (L) 03/11/2020   NEUTROABS 2.0 03/11/2020   HGB 11.9 (L) 03/11/2020   HCT 35.7 (L) 03/11/2020   MCV 106.3 (H) 03/11/2020   PLT 146 (L) 03/11/2020      Chemistry      Component Value Date/Time   NA 142 03/11/2020 1010   K 2.9 (LL) 03/11/2020 1010   CL 99 03/11/2020 1010   CO2 31 03/11/2020 1010   BUN 12 03/11/2020 1010   CREATININE 0.79 03/11/2020 1010      Component Value Date/Time   CALCIUM 9.1 03/11/2020 1010   ALKPHOS 107 03/11/2020 1010   AST 18 03/11/2020 1010   ALT 13 03/11/2020 1010   BILITOT 0.6 03/11/2020 1010     EKG is reviewed today

## 2020-03-11 NOTE — Assessment & Plan Note (Signed)
This is likely due to recent side effects of treatment She is not symptomatic Observe only for now 

## 2020-03-11 NOTE — Progress Notes (Signed)
Reported critical potassium 2.9 to Dr. Alvy Bimler. Reported critical magnesium 1.2 to Dr. Alvy Bimler.

## 2020-03-11 NOTE — Assessment & Plan Note (Signed)
Her pain is reasonably controlled with combination of methadone along with Percocet as needed I refill her prescription today Her EKG showed no evidence of QTC prolongation

## 2020-03-11 NOTE — Patient Instructions (Signed)
[facility] Discharge Instructions for Patients Receiving Chemotherapy  Today you received the following chemotherapy agents: pembrolizumab.  To help prevent nausea and vomiting after your treatment, we encourage you to take your nausea medication as directed.   If you develop nausea and vomiting that is not controlled by your nausea medication, call the clinic.   BELOW ARE SYMPTOMS THAT SHOULD BE REPORTED IMMEDIATELY:  *FEVER GREATER THAN 100.5 F  *CHILLS WITH OR WITHOUT FEVER  NAUSEA AND VOMITING THAT IS NOT CONTROLLED WITH YOUR NAUSEA MEDICATION  *UNUSUAL SHORTNESS OF BREATH  *UNUSUAL BRUISING OR BLEEDING  TENDERNESS IN MOUTH AND THROAT WITH OR WITHOUT PRESENCE OF ULCERS  *URINARY PROBLEMS  *BOWEL PROBLEMS  UNUSUAL RASH Items with * indicate a potential emergency and should be followed up as soon as possible.  Feel free to call the clinic should you have any questions or concerns. The clinic phone number is [phone].  Please show the CHEMO ALERT CARD at check-in to the Emergency Department and triage nurse.  Magnesium Sulfate injection What is this medicine? MAGNESIUM SULFATE (mag NEE zee um SUL fate) is an electrolyte injection commonly used to treat low magnesium levels in your blood. It is also used to prevent or control seizures in women with preeclampsia or eclampsia. This medicine may be used for other purposes; ask your health care provider or pharmacist if you have questions. What should I tell my health care provider before I take this medicine? They need to know if you have any of these conditions:  heart disease  history of irregular heart beat  kidney disease  an unusual or allergic reaction to magnesium sulfate, medicines, foods, dyes, or preservatives  pregnant or trying to get pregnant  breast-feeding How should I use this medicine? This medicine is for infusion into a vein. It is given by a health care professional in a  hospital or clinic setting. Talk to your pediatrician regarding the use of this medicine in children. While this drug may be prescribed for selected conditions, precautions do apply. Overdosage: If you think you have taken too much of this medicine contact a poison control center or emergency room at once. NOTE: This medicine is only for you. Do not share this medicine with others. What if I miss a dose? This does not apply. What may interact with this medicine? This medicine may interact with the following medications:  certain medicines for anxiety or sleep  certain medicines for seizures like phenobarbital  digoxin  medicines that relax muscles for surgery  narcotic medicines for pain This list may not describe all possible interactions. Give your health care provider a list of all the medicines, herbs, non-prescription drugs, or dietary supplements you use. Also tell them if you smoke, drink alcohol, or use illegal drugs. Some items may interact with your medicine. What should I watch for while using this medicine? Your condition will be monitored carefully while you are receiving this medicine. You may need blood work done while you are receiving this medicine. What side effects may I notice from receiving this medicine? Side effects that you should report to your doctor or health care professional as soon as possible:  allergic reactions like skin rash, itching or hives, swelling of the face, lips, or tongue  facial flushing  muscle weakness  signs and symptoms of low blood pressure like dizziness; feeling faint or lightheaded, falls; unusually weak or tired  signs and symptoms of a dangerous change in heartbeat or heart rhythm   like chest pain; dizziness; fast or irregular heartbeat; palpitations; breathing problems  sweating This list may not describe all possible side effects. Call your doctor for medical advice about side effects. You may report side effects to FDA at  1-800-FDA-1088. Where should I keep my medicine? This drug is given in a hospital or clinic and will not be stored at home. NOTE: This sheet is a summary. It may not cover all possible information. If you have questions about this medicine, talk to your doctor, pharmacist, or health care provider.  2020 [employer]/Gold Standard (2016-04-18 12:31:42)    

## 2020-03-11 NOTE — Assessment & Plan Note (Signed)
Her blood pressure is intermittently elevated She has no signs of proteinuria She is noted to have severe bradycardia I recommend reducing metoprolol to half the dose She remained bradycardic with her heart rate monitoring next week, we will discontinue metoprolol altogether

## 2020-03-12 LAB — T4: T4, Total: 7.2 ug/dL (ref 4.5–12.0)

## 2020-03-15 ENCOUNTER — Telehealth: Payer: Self-pay | Admitting: Hematology and Oncology

## 2020-03-15 NOTE — Telephone Encounter (Signed)
Scheduled appts per 5/28 sch msg. Pt confirmed appt dates and times.

## 2020-04-01 ENCOUNTER — Encounter: Payer: Self-pay | Admitting: Hematology and Oncology

## 2020-04-01 ENCOUNTER — Other Ambulatory Visit: Payer: Self-pay

## 2020-04-01 ENCOUNTER — Other Ambulatory Visit: Payer: Self-pay | Admitting: Hematology and Oncology

## 2020-04-01 ENCOUNTER — Inpatient Hospital Stay: Payer: Medicare Other

## 2020-04-01 ENCOUNTER — Inpatient Hospital Stay: Payer: Medicare Other | Admitting: Hematology and Oncology

## 2020-04-01 ENCOUNTER — Inpatient Hospital Stay: Payer: Medicare Other | Attending: Hematology and Oncology

## 2020-04-01 DIAGNOSIS — G893 Neoplasm related pain (acute) (chronic): Secondary | ICD-10-CM | POA: Diagnosis not present

## 2020-04-01 DIAGNOSIS — C541 Malignant neoplasm of endometrium: Secondary | ICD-10-CM | POA: Diagnosis not present

## 2020-04-01 DIAGNOSIS — E039 Hypothyroidism, unspecified: Secondary | ICD-10-CM

## 2020-04-01 DIAGNOSIS — R42 Dizziness and giddiness: Secondary | ICD-10-CM | POA: Diagnosis not present

## 2020-04-01 DIAGNOSIS — E876 Hypokalemia: Secondary | ICD-10-CM | POA: Diagnosis not present

## 2020-04-01 DIAGNOSIS — I10 Essential (primary) hypertension: Secondary | ICD-10-CM | POA: Diagnosis not present

## 2020-04-01 DIAGNOSIS — R001 Bradycardia, unspecified: Secondary | ICD-10-CM | POA: Diagnosis not present

## 2020-04-01 DIAGNOSIS — M549 Dorsalgia, unspecified: Secondary | ICD-10-CM | POA: Diagnosis not present

## 2020-04-01 DIAGNOSIS — Z5112 Encounter for antineoplastic immunotherapy: Secondary | ICD-10-CM | POA: Insufficient documentation

## 2020-04-01 DIAGNOSIS — Z7189 Other specified counseling: Secondary | ICD-10-CM

## 2020-04-01 LAB — CBC WITH DIFFERENTIAL (CANCER CENTER ONLY)
Abs Immature Granulocytes: 0.01 10*3/uL (ref 0.00–0.07)
Basophils Absolute: 0 10*3/uL (ref 0.0–0.1)
Basophils Relative: 0 %
Eosinophils Absolute: 0.1 10*3/uL (ref 0.0–0.5)
Eosinophils Relative: 1 %
HCT: 37.3 % (ref 36.0–46.0)
Hemoglobin: 12.4 g/dL (ref 12.0–15.0)
Immature Granulocytes: 0 %
Lymphocytes Relative: 29 %
Lymphs Abs: 1.3 10*3/uL (ref 0.7–4.0)
MCH: 35.3 pg — ABNORMAL HIGH (ref 26.0–34.0)
MCHC: 33.2 g/dL (ref 30.0–36.0)
MCV: 106.3 fL — ABNORMAL HIGH (ref 80.0–100.0)
Monocytes Absolute: 0.5 10*3/uL (ref 0.1–1.0)
Monocytes Relative: 10 %
Neutro Abs: 2.7 10*3/uL (ref 1.7–7.7)
Neutrophils Relative %: 60 %
Platelet Count: 142 10*3/uL — ABNORMAL LOW (ref 150–400)
RBC: 3.51 MIL/uL — ABNORMAL LOW (ref 3.87–5.11)
RDW: 14.4 % (ref 11.5–15.5)
WBC Count: 4.5 10*3/uL (ref 4.0–10.5)
nRBC: 0 % (ref 0.0–0.2)

## 2020-04-01 LAB — CMP (CANCER CENTER ONLY)
ALT: 19 U/L (ref 0–44)
AST: 20 U/L (ref 15–41)
Albumin: 3.6 g/dL (ref 3.5–5.0)
Alkaline Phosphatase: 133 U/L — ABNORMAL HIGH (ref 38–126)
Anion gap: 10 (ref 5–15)
BUN: 10 mg/dL (ref 8–23)
CO2: 32 mmol/L (ref 22–32)
Calcium: 9.3 mg/dL (ref 8.9–10.3)
Chloride: 98 mmol/L (ref 98–111)
Creatinine: 0.76 mg/dL (ref 0.44–1.00)
GFR, Est AFR Am: >60 mL/min (ref >60)
GFR, Estimated: >60 mL/min (ref >60)
Glucose, Bld: 121 mg/dL — ABNORMAL HIGH (ref 70–99)
Potassium: 3 mmol/L — CL (ref 3.5–5.1)
Sodium: 140 mmol/L (ref 135–145)
Total Bilirubin: 0.5 mg/dL (ref 0.3–1.2)
Total Protein: 7.2 g/dL (ref 6.5–8.1)

## 2020-04-01 LAB — MAGNESIUM: Magnesium: 1.5 mg/dL — ABNORMAL LOW (ref 1.7–2.4)

## 2020-04-01 LAB — TOTAL PROTEIN, URINE DIPSTICK: Protein, ur: NEGATIVE mg/dL

## 2020-04-01 LAB — TSH: TSH: 5.306 u[IU]/mL — ABNORMAL HIGH (ref 0.308–3.960)

## 2020-04-01 MED ORDER — SODIUM CHLORIDE 0.9 % IV SOLN
Freq: Once | INTRAVENOUS | Status: AC
  Filled 2020-04-01: qty 250

## 2020-04-01 MED ORDER — SODIUM CHLORIDE 0.9 % IV SOLN
200.0000 mg | Freq: Once | INTRAVENOUS | Status: AC
  Administered 2020-04-01: 200 mg via INTRAVENOUS
  Filled 2020-04-01: qty 8

## 2020-04-01 MED ORDER — HEPARIN SOD (PORK) LOCK FLUSH 100 UNIT/ML IV SOLN
500.0000 [IU] | Freq: Once | INTRAVENOUS | Status: AC | PRN
  Administered 2020-04-01: 500 [IU]
  Filled 2020-04-01: qty 5

## 2020-04-01 MED ORDER — LOSARTAN POTASSIUM 100 MG PO TABS
150.0000 mg | ORAL_TABLET | Freq: Every day | ORAL | 2 refills | Status: DC
Start: 2020-04-01 — End: 2020-08-01

## 2020-04-01 MED ORDER — SODIUM CHLORIDE 0.9% FLUSH
10.0000 mL | INTRAVENOUS | Status: DC | PRN
  Administered 2020-04-01: 10 mL
  Filled 2020-04-01: qty 10

## 2020-04-01 MED ORDER — HEPARIN SOD (PORK) LOCK FLUSH 100 UNIT/ML IV SOLN
500.0000 [IU] | Freq: Once | INTRAVENOUS | Status: DC
  Filled 2020-04-01: qty 5

## 2020-04-01 MED ORDER — OXYCODONE-ACETAMINOPHEN 10-325 MG PO TABS: 1.0000 | ORAL_TABLET | Freq: Four times a day (QID) | ORAL | 0 refills | Status: DC | PRN

## 2020-04-01 MED ORDER — METHADONE HCL 10 MG PO TABS: 20.0000 mg | ORAL_TABLET | Freq: Three times a day (TID) | ORAL | 0 refills | Status: DC

## 2020-04-01 MED ORDER — LEVOTHYROXINE SODIUM 50 MCG PO TABS: 50.0000 ug | ORAL_TABLET | Freq: Every day | ORAL | 11 refills | Status: AC

## 2020-04-01 MED ORDER — SODIUM CHLORIDE 0.9% FLUSH
10.0000 mL | Freq: Once | INTRAVENOUS | Status: AC
  Administered 2020-04-01: 10 mL
  Filled 2020-04-01: qty 10

## 2020-04-01 MED ORDER — MAGNESIUM SULFATE 4 GM/100ML IV SOLN
4.0000 g | Freq: Once | INTRAVENOUS | Status: AC
  Administered 2020-04-01: 4 g via INTRAVENOUS
  Filled 2020-04-01: qty 100

## 2020-04-01 MED ORDER — ALTEPLASE 2 MG IJ SOLR
2.0000 mg | Freq: Once | INTRAMUSCULAR | Status: DC
  Filled 2020-04-01: qty 2

## 2020-04-01 NOTE — Assessment & Plan Note (Signed)
She has multiple different side effects from treatment with electrolyte imbalances, hypertension, acute on chronic pain and bradycardia We will proceed with treatment today EKG today satisfactory We will correct hypomagnesemia I will call her next week for further follow-up She needs minimum 4 cycles of pembrolizumab before repeat imaging study

## 2020-04-01 NOTE — Progress Notes (Signed)
Reported potassium of 3 to Dr. Alvy Bimler.

## 2020-04-01 NOTE — Assessment & Plan Note (Signed)
Her blood pressure control is better but she is getting bradycardic I will discontinue metoprolol I have discontinue Hyzaar I refill her prescription by replacing it with losartan only

## 2020-04-01 NOTE — Assessment & Plan Note (Signed)
She has poorly controlled pain I recommend increasing the dose of methadone I have repeated her EKG QTC level is okay She is still somewhat bradycardiac I will discontinue metoprolol I will call her next week to assess pain management She is aware about increasing risk of sedation and constipation with increased pain medicine

## 2020-04-01 NOTE — Assessment & Plan Note (Signed)
Her magnesium level is low, could be related to her diuretic therapy and prior chemotherapy causing magnesium wasting I recommend IV magnesium today followed by oral magnesium replacement therapy I recommend discontinuation of diuretic therapy

## 2020-04-01 NOTE — Progress Notes (Signed)
Per Dr. Alvy Bimler, patient to receive magnesium 4 grams IV today in infusion. No potassium supplementation at this time - oral diuretics were discontinued.   Demetrius Charity, PharmD, BCPS, [facility] Pharmacist Pharmacy Phone: [phone] 04/01/2020

## 2020-04-01 NOTE — Assessment & Plan Note (Signed)
This likely contributed to bradycardia I will start her on Synthroid This is due to side effects of immunotherapy

## 2020-04-01 NOTE — Progress Notes (Addendum)
[place] OFFICE PROGRESS NOTE  Patient Care Team: Ronita Hipps, MD as PCP - General (Family Medicine) Nicholaus Bloom, MD (Anesthesiology)  ASSESSMENT & PLAN:  Endometrial cancer [facility]) She has multiple different side effects from treatment with electrolyte imbalances, hypertension, acute on chronic pain and bradycardia We will proceed with treatment today EKG today satisfactory We will correct hypomagnesemia I will call her next week for further follow-up She needs minimum 4 cycles of pembrolizumab before repeat imaging study  Cancer associated pain She has poorly controlled pain I recommend increasing the dose of methadone I have repeated her EKG QTC level is okay She is still somewhat bradycardiac I will discontinue metoprolol I will call her next week to assess pain management She is aware about increasing risk of sedation and constipation with increased pain medicine  Hypomagnesemia Her magnesium level is low, could be related to her diuretic therapy and prior chemotherapy causing magnesium wasting I recommend IV magnesium today followed by oral magnesium replacement therapy I recommend discontinuation of diuretic therapy  Hypokalemia due to loss of potassium Repeat potassium level has improved She will continue on potassium rich diet I would discontinue her diuretic therapy   Essential hypertension Her blood pressure control is better but she is getting bradycardic I will discontinue metoprolol I have discontinue Hyzaar I refill her prescription by replacing it with losartan only  Acquired hypothyroidism This likely contributed to bradycardia I will start her on Synthroid This is due to side effects of immunotherapy   No orders of the defined types were placed in this encounter.   All questions were answered. The patient knows to call the clinic with any problems, questions or concerns. The total time spent in the appointment was 40 minutes  encounter with patients including review of chart and various tests results, discussions about plan of care and coordination of care plan   Heath Lark, MD 04/01/2020 11:31 AM  INTERVAL HISTORY: Please see below for problem oriented charting. 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 A Pap was done 09/02/18 showign ASC-H. She was referred onto GYN and seen by Dr. Paula Compton    09/09/2018 Procedure   She underwent endometrial biopsy   09/12/2018 Pathology Results   Endometrial biopsy positive for adenocarcinoma   09/22/2018 Imaging   Ct abdomen and pelvis showed: Diffuse endometrial thickening, consistent with primary endometrial carcinoma.  Pelvic and abdominal retroperitoneal and retrocrural lymphadenopathy, consistent with metastatic disease.  Moderate hepatic steatosis.   09/25/2018 Cancer Staging   Staging form: Corpus Uteri - Carcinoma and Carcinosarcoma, AJCC 8th Edition - Pathologic: Stage IIIC2 (pT2, pN2, cM0) - Signed by Heath Lark, MD on 02/23/2019   10/01/2018 Procedure   Successful placement of a right IJ approach Power Port with ultrasound and fluoroscopic guidance. The catheter is ready for use.   10/02/2018 Tumor Marker   Patient's tumor was tested for the following markers: CA-125  Results of the tumor marker test revealed 578   10/03/2018 -  01/16/2019 Chemotherapy   The patient had carboplatin and Taxol with dose adjustment due to neuropathy x 6 cycles   10/24/2018 Tumor Marker   Patient's tumor was tested for the following markers: CA-125 Results of the tumor marker test revealed 296    Genetic Testing   Patient has genetic testing done for MSI/MMR. Results revealed MSI stable and MMR normal on Accession [id number].   11/14/2018 Tumor Marker   Patient's tumor was tested for the following markers: CA-125 Results of the tumor marker test revealed 81   11/27/2018 Imaging   1. Significant decrease in abdominal retroperitoneal and bilateral iliac lymphadenopathy since previous study. 2. Significant decrease in abnormal endometrial soft tissue density since prior study. 3. No new or progressive metastatic disease identified.   12/05/2018 Tumor Marker   Patient's tumor was tested for the following markers: CA-125 Results of the tumor marker test revealed 46.3   12/26/2018 Tumor Marker   Patient's tumor was tested for the following markers: CA-125 Results of the tumor marker test revealed 38.2   01/16/2019 Tumor Marker   Patient's tumor was tested for the following markers: CA-125 Results of the tumor marker test revealed 30.8   02/03/2019 Imaging   1. Numerous retroperitoneal, iliac, and pelvic lymph nodes are stable or slightly decreased in size compared to immediate prior examination dated 11/27/2018 and significantly decreased in size compared to exam dated 09/20/2018.  2.  Unchanged thickening of the endometrium.  3. Other chronic, incidental, and postoperative findings as detailed above.   02/12/2019 Imaging   1. Uterus +/- tubes/ovaries, neoplastic, cervix, bilateral fallopian tubes and ovaries - MIXED HIGH-GRADE SEROUS AND ENDOMETRIAL ADENOCARCINOMA, 6.5 CM. SEE NOTE - CARCINOMA INVOLVES ENTIRE ENDOMETRIUM, ANTERIOR AND POSTERIOR LOWER UTERINE SEGMENTS AND ANTERIOR AND POSTERIOR CERVIX. - CARCINOMA INVADES FOR A  DEPTH OF 1.9 CM WHERE MYOMETRIAL THICKNESS IS 2.0 CM (GREATER THAN 50% MYOMETRIAL INVASION) - CARCINOMA IS 1 MM FROM THE CERVICAL MARGIN - NEGATIVE FOR LYMPHOVASCULAR OR PERINEURAL INVASION - BILATERAL UNREMARKABLE FALLOPIAN TUBES AND OVARIES, NEGATIVE FOR CARCINOMA - SEE ONCOLOGY TABLE 2. Lymph node, biopsy, right pelvic - METASTATIC CARCINOMA TO ONE OF TWO LYMPH NODES (1/2) Microscopic Comment 1. (v4.1.0.0) UTERUS, CARCINOMA OR CARCINOSARCOMA Procedure: Total hysterectomy with bilateral salpingo-oophorectomy Histologic type: Mixed high-grade serous and endometrioid adenocarcinoma Histologic Grade: NA Myometrial invasion: Present Depth of invasion: 19 mm Myometrial thickness: 20 mm Uterine Serosa Involvement: Not identified Cervical stromal involvement: Present Extent of involvement of other organs: NA Lymphovascular invasion: Not identified Regional Lymph Nodes: Examined: 0 Sentinel 2 Non-sentinel 2 Total Lymph nodes with metastasis: 1 Isolated tumor cells (< 0.2 mm): 0 Micrometastasis: (> 0.2 mm and < 2.0 mm): 1 Macrometastasis: (> 2.0 mm): 0 Extracapsular extension: Not identified Tumor block for ancillary studies: 58F MMR / MSI testing: Pending Pathologic Stage Classification (pTNM, AJCC 8th edition): pT2, pN88m FIGO Stage: IIIC1 Representative Tumor Block: 1A, 1C (v4.1.0.0) Diagnosis Note 1. Immunohistochemical stains for p16, p53, ER and Ki-67 show a pattern of staining consistent with mixed high-grade serous and endometrial adenocarcinoma. Dr. KLyndon Codehas reviewed this case and concurs with the above interpretation. A molecular study for microsatellite instability (MSI) and immunostains for MMR-related proteins are pending and will be reported in an addendum.   02/12/2019 Surgery   Pre-operative Diagnosis: endometrial cancer stage IIIC2, s/p neoadjuvant chemotherapy, extreme obesity (BMI 53kg/m2)  Operation: Robotic-assisted laparoscopic total hysterectomy with bilateral  salpingoophorectomy, right pelvic lymphadenectomy. 22 modifier  for extreme obesity  Surgeon: Donaciano Eva  Assistant Surgeon: Lahoma Crocker MD  Operative Findings:  : extreme obesity, BMI 53kg/m2, with significant retroperitoneal and intraperitoneal adiposity. Obesity necessitated an additional hour of operating time to create safe exposure. Obesity required additional personnel in the operating room for positioning and retraction. Severe obesity substantially increased the complexity of the procedure. 8cm uterus, grossly normal, normal appearing cervix. Retroperitoneal fibrosis consistent with nodal positivity. Clinically suspicious right pelvic lymph node. Unable to completely visualize retroperitoneal nodes due to extreme obesity.    02/12/2019 Pathology Results   IMMUNOHISTOCHEMICAL AND MORPHOMETRIC ANALYSIS PERFORMED MANUALLY The tumor cells are POSITIVE for Her2 (3+). Estrogen Receptor: 70%, POSITIVE, STRONG STAINING INTENSITY Progesterone Receptor: 70%, POSITIVE, STRONG STAINING INTENSITY Proliferation Marker Ki67: 20% REFERENCE RANGE ESTROGEN RECEPTOR NEGATIVE 0% POSITIVE =>1% REFERENCE RANGE PROGESTERONE RECEPTOR NEGATIVE 0% POSITIVE =>1% All controls stained appropriately  1. Uterus +/- tubes/ovaries, neoplastic, cervix, bilateral fallopian tubes and ovaries - MIXED HIGH-GRADE SEROUS AND ENDOMETRIAL ADENOCARCINOMA, 6.5 CM. SEE NOTE - CARCINOMA INVOLVES ENTIRE ENDOMETRIUM, ANTERIOR AND POSTERIOR LOWER UTERINE SEGMENTS AND ANTERIOR AND POSTERIOR CERVIX. - CARCINOMA INVADES FOR A DEPTH OF 1.9 CM WHERE MYOMETRIAL THICKNESS IS 2.0 CM (GREATER THAN 50% MYOMETRIAL INVASION) - CARCINOMA IS 1 MM FROM THE CERVICAL MARGIN - NEGATIVE FOR LYMPHOVASCULAR OR PERINEURAL INVASION - BILATERAL UNREMARKABLE FALLOPIAN TUBES AND OVARIES, NEGATIVE FOR CARCINOMA - SEE ONCOLOGY TABLE 2. Lymph node, biopsy, right pelvic - METASTATIC CARCINOMA TO ONE OF TWO LYMPH NODES  (1/2) Microscopic Comment 1. (v4.1.0.0) UTERUS, CARCINOMA OR CARCINOSARCOMA Procedure: Total hysterectomy with bilateral salpingo-oophorectomy Histologic type: Mixed high-grade serous and endometrioid adenocarcinoma Histologic Grade: NA Myometrial invasion: Present Depth of invasion: 19 mm Myometrial thickness: 20 mm Uterine Serosa Involvement: Not identified Cervical stromal involvement: Present Extent of involvement of other organs: NA Lymphovascular invasion: Not identified Regional Lymph Nodes: Examined: 0 Sentinel 2 Non-sentinel 2 Total Lymph nodes with metastasis: 1 Isolated tumor cells (< 0.2 mm): 0 Micrometastasis: (> 0.2 mm and < 2.0 mm): 1 Macrometastasis: (> 2.0 mm): 0 Extracapsular extension: Not identified Tumor block for ancillary studies: 57F MMR / MSI testing: Pending Pathologic Stage Classification (pTNM, AJCC 8th edition): pT2, pN63m FIGO Stage: IIIC1 Representative Tumor Block: 1A, 1C   03/17/2019 Echocardiogram   1. The left ventricle has normal systolic function, with an ejection fraction of 55-60%. The cavity size was normal. There is moderate concentric left ventricular hypertrophy. Left ventricular diastolic Doppler parameters are consistent with impaired relaxation.  2. The right ventricle has normal systolic function. The cavity was normal. There is no increase in right ventricular wall thickness.  3. GLS: -15.5%, no prior study available for comparison.   03/17/2019 Tumor Marker   Patient's tumor was tested for the following markers: CA-125 Results of the tumor marker test revealed 43   03/20/2019 -  Chemotherapy   The patient had trastuzumab for chemotherapy treatment.     04/30/2019 Tumor Marker   Patient's tumor was tested for the following markers:CA-125 Results of the tumor marker test revealed 29   05/20/2019 Imaging   1. No evidence of metastatic disease. 2. Small pelvic and left paracolic gutter ascites, new. 3. Aortic atherosclerosis  (ICD10-170.0). Coronary artery calcification.     05/26/2019 Echocardiogram      1. The left ventricle has normal systolic function with an ejection fraction of 60-65%. The cavity size was normal. There is mildly increased left ventricular wall thickness. Left ventricular diastolic parameters were normal.  2. GLS -16.4% (underestimated due  to poor endocardial tracking).  3. The right ventricle has normal systolic function. The cavity was normal. There is no increase in right ventricular wall thickness.  4. Mild calcification of the mitral valve leaflet.  5. Mild calcification of the aortic valve. No stenosis of the aortic valve.  6. The aorta is normal in size and structure.   09/02/2019 Imaging   No evidence of recurrent or metastatic carcinoma within the abdomen or pelvis.   Colonic diverticulosis. No radiographic evidence of diverticulitis.   Increased large stool burden noted; recommend clinical correlation for possible constipation.   09/02/2019 Echocardiogram   1. Left ventricular ejection fraction, by visual estimation, is 60 to 65%. The left ventricle has normal function. There is no left ventricular hypertrophy.  2. Global right ventricle has normal systolic function.The right ventricular size is normal. No increase in right ventricular wall thickness.  3. Left atrial size was normal.  4. Right atrial size was normal.  5. The mitral valve is normal in structure. No evidence of mitral valve regurgitation.  6. The tricuspid valve is normal in structure. Tricuspid valve regurgitation is trivial.  7. The aortic valve is normal in structure. Aortic valve regurgitation is not visualized.  8. The pulmonic valve was normal in structure. Pulmonic valve regurgitation is trivial.  9. Mildly elevated pulmonary artery systolic pressure. 10. The average left ventricular global longitudinal strain is -26.9 %.   09/03/2019 Tumor Marker   Patient's tumor was tested for the following markers:  CA-125 Results of the tumor marker test revealed 13.5   11/05/2019 Tumor Marker   Patient's tumor was tested for the following markers: CA-125 Results of the tumor marker test revealed 27.   11/25/2019 Imaging   1. New omental nodules suspicious for early peritoneal spread of malignancy. 2. New thickening along the right paracolic gutter concerning for potential tumor. 3. Other imaging findings of potential clinical significance: Mild cardiomegaly. Stable lateral segment left hepatic lobe hypodense lesions, likely cysts. Multilevel posterior decompression with multilevel foraminal impingement. 4. 4 mm left lower lobe pulmonary nodule not readily appreciable on the prior exam, could be inflammatory or neoplastic. Surveillance suggested. 5. Other imaging findings of potential clinical significance: Mild cardiomegaly. Suspected hepatic cysts. Multilevel lumbar impingement.   Aortic Atherosclerosis (ICD10-I70.0).   11/26/2019 Tumor Marker   Patient's tumor was tested for the following markers: CA-125 Results of the tumor marker test revealed 129   12/11/2019 -  Chemotherapy   The patient had carboplatin and taxol for chemotherapy treatment.     01/01/2020 Tumor Marker   Patient's tumor was tested for the following markers: CA-125 Results of the tumor marker test revealed 123   01/22/2020 Tumor Marker   Patient's tumor was tested for the following markers: CA-125 Results of the tumor marker test revealed 109.   02/11/2020 Imaging   1. Interval worsening of disease as evidenced by new and enlarging peritoneal and omental nodularity. 2. Slight enlargement of retroperitoneal lymph nodes. 3. New small area of low attenuation along the medial margin of the spleen, potentially small capsular implant not seen on the previous study.   Aortic Atherosclerosis (ICD10-I70.0).     02/19/2020 -  Chemotherapy   The patient had pembrolizumab plus Lenvatinib for chemotherapy treatment.     02/19/2020 Tumor  Marker   Patient's tumor was tested for the following markers: CA-125 Results of the tumor marker test revealed 308     REVIEW OF SYSTEMS:   Constitutional: Denies fevers, chills or abnormal weight loss  Eyes: Denies blurriness of vision Ears, nose, mouth, throat, and face: Denies mucositis or sore throat Respiratory: Denies cough, dyspnea or wheezes Cardiovascular: Denies palpitation, chest discomfort or lower extremity swelling Gastrointestinal:  Denies nausea, heartburn or change in bowel habits Skin: Denies abnormal skin rashes Lymphatics: Denies new lymphadenopathy or easy bruising Neurological:Denies numbness, tingling or new weaknesses Behavioral/Psych: Mood is stable, no new changes  All other systems were reviewed with the patient and are negative.  I have reviewed the past medical history, past surgical history, social history and family history with the patient and they are unchanged from previous note.  ALLERGIES:  is allergic to tegaderm ag mesh [silver].  MEDICATIONS:  Current Outpatient Medications  Medication Sig Dispense Refill  . diphenhydrAMINE (BENADRYL) 25 mg capsule Take 100 mg by mouth 2 (two) times a day.    . furosemide (LASIX) 20 MG tablet Take 1 tablet (20 mg total) by mouth daily. 30 tablet 11  . gabapentin (NEURONTIN) 400 MG capsule Take 3 capsules (1,200 mg total) by mouth 3 (three) times daily. 270 capsule 11  . lenvatinib 20 mg daily dose (LENVIMA) 2 x 10 MG capsule Take 2 capsules (20 mg total) by mouth daily. 60 capsule 11  . levothyroxine (SYNTHROID) 50 MCG tablet Take 1 tablet (50 mcg total) by mouth daily before breakfast. 30 tablet 11  . lidocaine-prilocaine (EMLA) cream Apply to affected area once 30 g 3  . loratadine (CLARITIN) 10 MG tablet Take 10 mg by mouth daily.    Marland Kitchen losartan (COZAAR) 100 MG tablet Take 1.5 tablets (150 mg total) by mouth daily. 90 tablet 2  . magnesium oxide (MAG-OX) 400 (241.3 Mg) MG tablet Take 1 tablet (400 mg total)  by mouth daily. 30 tablet 3  . methadone (DOLOPHINE) 10 MG tablet Take 2 tablets (20 mg total) by mouth every 8 (eight) hours. 90 tablet 0  . ondansetron (ZOFRAN) 8 MG tablet Take 8 mg by mouth every 8 (eight) hours as needed.    Marland Kitchen oxyCODONE-acetaminophen (PERCOCET) 10-325 MG tablet Take 1-2 tablets by mouth every 6 (six) hours as needed for pain. 90 tablet 0  . prochlorperazine (COMPAZINE) 10 MG tablet TAKE 1 TABLET BY MOUTH EVERY 6 HOURS AS NEEDED FOR NAUSEA OR VOMITING 30 tablet 1  . ranitidine (ZANTAC) 150 MG capsule Take 150 mg by mouth 2 (two) times daily.    . rivaroxaban (XARELTO) 20 MG TABS tablet Take 1 tablet (20 mg total) by mouth daily with supper. 30 tablet 9   No current facility-administered medications for this visit.   Facility-Administered Medications Ordered in Other Visits  Medication Dose Route Frequency Provider Last Rate Last Admin  . 0.9 %  sodium chloride infusion   Intravenous Once Alvy Bimler, Mattingly Fountaine, MD      . magnesium sulfate IVPB 4 g 100 mL  4 g Intravenous Once Alvy Bimler, Burley Kopka, MD      . pembrolizumab (KEYTRUDA) 200 mg in sodium chloride 0.9 % 50 mL chemo infusion  200 mg Intravenous Once Alvy Bimler, Lajean Boese, MD        PHYSICAL EXAMINATION: ECOG PERFORMANCE STATUS: 1 - Symptomatic but completely ambulatory  Vitals:   04/01/20 1025  BP: (!) 150/83  Pulse: (!) 55  Resp: 18  Temp: (!) 97.3 F (36.3 C)  SpO2: 99%   Filed Weights   04/01/20 1025  Weight: 277 lb 12.8 oz (126 kg)    GENERAL:alert, no distress and comfortable She is bradycardic NEURO: alert & oriented x 3 with fluent  speech, no focal motor/sensory deficits  LABORATORY DATA:  I have reviewed the data as listed    Component Value Date/Time   NA 140 04/01/2020 0945   K 3.0 (LL) 04/01/2020 0945   CL 98 04/01/2020 0945   CO2 32 04/01/2020 0945   GLUCOSE 121 (H) 04/01/2020 0945   BUN 10 04/01/2020 0945   CREATININE 0.76 04/01/2020 0945   CALCIUM 9.3 04/01/2020 0945   PROT 7.2 04/01/2020 0945    ALBUMIN 3.6 04/01/2020 0945   AST 20 04/01/2020 0945   ALT 19 04/01/2020 0945   ALKPHOS 133 (H) 04/01/2020 0945   BILITOT 0.5 04/01/2020 0945   GFRNONAA >60 04/01/2020 0945   GFRAA >60 04/01/2020 0945    No results found for: SPEP, UPEP  Lab Results  Component Value Date   WBC 4.5 04/01/2020   NEUTROABS 2.7 04/01/2020   HGB 12.4 04/01/2020   HCT 37.3 04/01/2020   MCV 106.3 (H) 04/01/2020   PLT 142 (L) 04/01/2020      Chemistry      Component Value Date/Time   NA 140 04/01/2020 0945   K 3.0 (LL) 04/01/2020 0945   CL 98 04/01/2020 0945   CO2 32 04/01/2020 0945   BUN 10 04/01/2020 0945   CREATININE 0.76 04/01/2020 0945      Component Value Date/Time   CALCIUM 9.3 04/01/2020 0945   ALKPHOS 133 (H) 04/01/2020 0945   AST 20 04/01/2020 0945   ALT 19 04/01/2020 0945   BILITOT 0.5 04/01/2020 0945      EKG is reviewed

## 2020-04-01 NOTE — Patient Instructions (Signed)
[facility] Discharge Instructions for Patients Receiving Chemotherapy  Today you received the following chemotherapy agents: pembrolizumab.  To help prevent nausea and vomiting after your treatment, we encourage you to take your nausea medication as directed.   If you develop nausea and vomiting that is not controlled by your nausea medication, call the clinic.   BELOW ARE SYMPTOMS THAT SHOULD BE REPORTED IMMEDIATELY:  *FEVER GREATER THAN 100.5 F  *CHILLS WITH OR WITHOUT FEVER  NAUSEA AND VOMITING THAT IS NOT CONTROLLED WITH YOUR NAUSEA MEDICATION  *UNUSUAL SHORTNESS OF BREATH  *UNUSUAL BRUISING OR BLEEDING  TENDERNESS IN MOUTH AND THROAT WITH OR WITHOUT PRESENCE OF ULCERS  *URINARY PROBLEMS  *BOWEL PROBLEMS  UNUSUAL RASH Items with * indicate a potential emergency and should be followed up as soon as possible.  Feel free to call the clinic should you have any questions or concerns. The clinic phone number is [phone].  Please show the [place] at check-in to the Emergency Department and triage nurse.  Magnesium Sulfate injection What is this medicine? MAGNESIUM SULFATE (mag NEE zee um SUL fate) is an electrolyte injection commonly used to treat low magnesium levels in your blood. It is also used to prevent or control seizures in women with preeclampsia or eclampsia. This medicine may be used for other purposes; ask your health care provider or pharmacist if you have questions. What should I tell my health care provider before I take this medicine? They need to know if you have any of these conditions:  heart disease  history of irregular heart beat  kidney disease  an unusual or allergic reaction to magnesium sulfate, medicines, foods, dyes, or preservatives  pregnant or trying to get pregnant  breast-feeding How should I use this medicine? This medicine is for infusion into a vein. It is given by a health care professional in a  hospital or clinic setting. Talk to your pediatrician regarding the use of this medicine in children. While this drug may be prescribed for selected conditions, precautions do apply. Overdosage: If you think you have taken too much of this medicine contact a poison control center or emergency room at once. NOTE: This medicine is only for you. Do not share this medicine with others. What if I miss a dose? This does not apply. What may interact with this medicine? This medicine may interact with the following medications:  certain medicines for anxiety or sleep  certain medicines for seizures like phenobarbital  digoxin  medicines that relax muscles for surgery  narcotic medicines for pain This list may not describe all possible interactions. Give your health care provider a list of all the medicines, herbs, non-prescription drugs, or dietary supplements you use. Also tell them if you smoke, drink alcohol, or use illegal drugs. Some items may interact with your medicine. What should I watch for while using this medicine? Your condition will be monitored carefully while you are receiving this medicine. You may need blood work done while you are receiving this medicine. What side effects may I notice from receiving this medicine? Side effects that you should report to your doctor or health care professional as soon as possible:  allergic reactions like skin rash, itching or hives, swelling of the face, lips, or tongue  facial flushing  muscle weakness  signs and symptoms of low blood pressure like dizziness; feeling faint or lightheaded, falls; unusually weak or tired  signs and symptoms of a dangerous change in heartbeat or heart rhythm  like chest pain; dizziness; fast or irregular heartbeat; palpitations; breathing problems  sweating This list may not describe all possible side effects. Call your doctor for medical advice about side effects. You may report side effects to FDA at  1-800-FDA-1088. Where should I keep my medicine? This drug is given in a hospital or clinic and will not be stored at home. NOTE: This sheet is a summary. It may not cover all possible information. If you have questions about this medicine, talk to your doctor, pharmacist, or health care provider.  2020 [employer] Standard (2016-04-18 12:31:42)

## 2020-04-01 NOTE — Assessment & Plan Note (Signed)
Repeat potassium level has improved She will continue on potassium rich diet I would discontinue her diuretic therapy

## 2020-04-02 LAB — T4: T4, Total: 7.9 ug/dL (ref 4.5–12.0)

## 2020-04-02 LAB — CA 125: Cancer Antigen (CA) 125: 375 U/mL — ABNORMAL HIGH (ref 0.0–38.1)

## 2020-04-04 ENCOUNTER — Telehealth: Payer: Self-pay

## 2020-04-04 NOTE — Telephone Encounter (Signed)
-----   Message from Heath Lark, MD sent at 04/04/2020  8:07 AM EDT ----- Regarding: can you call how is her BP, HR and pain control is over the weekend?

## 2020-04-04 NOTE — Telephone Encounter (Signed)
Called and given below message. She verbalized understanding. She is doing well with pain. 6/19 am bp- 115/72 and HR 62/ pm 138/66 and HR 67. She missed one dose of bp medication due to pharmacy delay. 6/20 am bp-143/96 HR 62 / pm 158/74 HR-56 6/21 am bp134/72 and HR 53.

## 2020-04-19 ENCOUNTER — Other Ambulatory Visit: Payer: Self-pay | Admitting: Hematology and Oncology

## 2020-04-19 ENCOUNTER — Inpatient Hospital Stay: Payer: Medicare Other

## 2020-04-19 ENCOUNTER — Telehealth: Payer: Self-pay

## 2020-04-19 ENCOUNTER — Inpatient Hospital Stay: Payer: Medicare Other | Admitting: Hematology and Oncology

## 2020-04-19 NOTE — Telephone Encounter (Signed)
Called regarding today's appts. She is too early for treatment and not due to Friday. She would like to reschedule everything to Friday. Scheduling message sent.

## 2020-04-22 ENCOUNTER — Inpatient Hospital Stay: Payer: Medicare Other | Attending: Hematology and Oncology | Admitting: Hematology and Oncology

## 2020-04-22 ENCOUNTER — Other Ambulatory Visit: Payer: Self-pay

## 2020-04-22 ENCOUNTER — Inpatient Hospital Stay: Payer: Medicare Other

## 2020-04-22 ENCOUNTER — Encounter: Payer: Self-pay | Admitting: Hematology and Oncology

## 2020-04-22 ENCOUNTER — Inpatient Hospital Stay: Payer: Medicare Other | Admitting: Hematology and Oncology

## 2020-04-22 VITALS — BP 163/91 | HR 59 | Temp 98.2°F | Resp 18 | Ht 63.0 in | Wt 274.0 lb

## 2020-04-22 DIAGNOSIS — E876 Hypokalemia: Secondary | ICD-10-CM

## 2020-04-22 DIAGNOSIS — G893 Neoplasm related pain (acute) (chronic): Secondary | ICD-10-CM | POA: Insufficient documentation

## 2020-04-22 DIAGNOSIS — Z7189 Other specified counseling: Secondary | ICD-10-CM

## 2020-04-22 DIAGNOSIS — C541 Malignant neoplasm of endometrium: Secondary | ICD-10-CM | POA: Insufficient documentation

## 2020-04-22 DIAGNOSIS — R001 Bradycardia, unspecified: Secondary | ICD-10-CM | POA: Diagnosis not present

## 2020-04-22 DIAGNOSIS — Z5112 Encounter for antineoplastic immunotherapy: Secondary | ICD-10-CM | POA: Diagnosis not present

## 2020-04-22 DIAGNOSIS — K5909 Other constipation: Secondary | ICD-10-CM | POA: Insufficient documentation

## 2020-04-22 DIAGNOSIS — I10 Essential (primary) hypertension: Secondary | ICD-10-CM | POA: Diagnosis not present

## 2020-04-22 LAB — CMP (CANCER CENTER ONLY)
ALT: 15 U/L (ref 0–44)
AST: 19 U/L (ref 15–41)
Albumin: 3.6 g/dL (ref 3.5–5.0)
Alkaline Phosphatase: 118 U/L (ref 38–126)
Anion gap: 9 (ref 5–15)
BUN: 13 mg/dL (ref 8–23)
CO2: 30 mmol/L (ref 22–32)
Calcium: 9.2 mg/dL (ref 8.9–10.3)
Chloride: 101 mmol/L (ref 98–111)
Creatinine: 0.78 mg/dL (ref 0.44–1.00)
GFR, Est AFR Am: >60 mL/min (ref >60)
GFR, Estimated: >60 mL/min (ref >60)
Glucose, Bld: 115 mg/dL — ABNORMAL HIGH (ref 70–99)
Potassium: 3.6 mmol/L (ref 3.5–5.1)
Sodium: 140 mmol/L (ref 135–145)
Total Bilirubin: 0.5 mg/dL (ref 0.3–1.2)
Total Protein: 7.3 g/dL (ref 6.5–8.1)

## 2020-04-22 LAB — CBC WITH DIFFERENTIAL (CANCER CENTER ONLY)
Abs Immature Granulocytes: 0 10*3/uL (ref 0.00–0.07)
Basophils Absolute: 0 10*3/uL (ref 0.0–0.1)
Basophils Relative: 0 %
Eosinophils Absolute: 0 10*3/uL (ref 0.0–0.5)
Eosinophils Relative: 0 %
HCT: 37.7 % (ref 36.0–46.0)
Hemoglobin: 12.2 g/dL (ref 12.0–15.0)
Immature Granulocytes: 0 %
Lymphocytes Relative: 37 %
Lymphs Abs: 1.5 10*3/uL (ref 0.7–4.0)
MCH: 34.1 pg — ABNORMAL HIGH (ref 26.0–34.0)
MCHC: 32.4 g/dL (ref 30.0–36.0)
MCV: 105.3 fL — ABNORMAL HIGH (ref 80.0–100.0)
Monocytes Absolute: 0.5 10*3/uL (ref 0.1–1.0)
Monocytes Relative: 12 %
Neutro Abs: 2.1 10*3/uL (ref 1.7–7.7)
Neutrophils Relative %: 51 %
Platelet Count: 136 10*3/uL — ABNORMAL LOW (ref 150–400)
RBC: 3.58 MIL/uL — ABNORMAL LOW (ref 3.87–5.11)
RDW: 13.2 % (ref 11.5–15.5)
WBC Count: 4.2 10*3/uL (ref 4.0–10.5)
nRBC: 0 % (ref 0.0–0.2)

## 2020-04-22 LAB — MAGNESIUM: Magnesium: 1.4 mg/dL — CL (ref 1.7–2.4)

## 2020-04-22 LAB — TOTAL PROTEIN, URINE DIPSTICK: Protein, ur: NEGATIVE mg/dL

## 2020-04-22 LAB — TSH: TSH: 2.735 u[IU]/mL (ref 0.308–3.960)

## 2020-04-22 MED ORDER — SODIUM CHLORIDE 0.9 % IV SOLN
200.0000 mg | Freq: Once | INTRAVENOUS | Status: AC
  Administered 2020-04-22: 200 mg via INTRAVENOUS
  Filled 2020-04-22: qty 8

## 2020-04-22 MED ORDER — MAGNESIUM SULFATE 2 GM/50ML IV SOLN
2.0000 g | Freq: Once | INTRAVENOUS | Status: AC
  Administered 2020-04-22: 2 g via INTRAVENOUS

## 2020-04-22 MED ORDER — MAGNESIUM SULFATE 2 GM/50ML IV SOLN
INTRAVENOUS | Status: AC
  Filled 2020-04-22: qty 50

## 2020-04-22 MED ORDER — SODIUM CHLORIDE 0.9% FLUSH
10.0000 mL | INTRAVENOUS | Status: DC | PRN
  Administered 2020-04-22: 10 mL
  Filled 2020-04-22: qty 10

## 2020-04-22 MED ORDER — METHADONE HCL 10 MG PO TABS: 20.0000 mg | ORAL_TABLET | Freq: Three times a day (TID) | ORAL | 0 refills | Status: DC

## 2020-04-22 MED ORDER — HEPARIN SOD (PORK) LOCK FLUSH 100 UNIT/ML IV SOLN
500.0000 [IU] | Freq: Once | INTRAVENOUS | Status: AC | PRN
  Administered 2020-04-22: 500 [IU]
  Filled 2020-04-22: qty 5

## 2020-04-22 MED ORDER — SODIUM CHLORIDE 0.9 % IV SOLN
Freq: Once | INTRAVENOUS | Status: AC
  Filled 2020-04-22: qty 250

## 2020-04-22 MED ORDER — SODIUM CHLORIDE 0.9% FLUSH
10.0000 mL | Freq: Once | INTRAVENOUS | Status: AC
  Administered 2020-04-22: 10 mL
  Filled 2020-04-22: qty 10

## 2020-04-22 NOTE — Assessment & Plan Note (Signed)
I have reviewed her blood pressure documentation from home Her blood pressure seems to fluctuate widely She will continue current prescription of blood pressure medicine We will continue to observe closely

## 2020-04-22 NOTE — Assessment & Plan Note (Signed)
She has multiple different side effects from treatment with electrolyte imbalances, hypertension, acute on chronic pain and bradycardia We will proceed with treatment today Since I adjusted her blood pressure medication, bradycardia is improving Her pain is stable We will proceed with cycle 4 of pembrolizumab today I plan to see her back in 2 weeks with repeat CT imaging for objective assessment of response to therapy

## 2020-04-22 NOTE — Assessment & Plan Note (Signed)
She will receive IV magnesium replacement

## 2020-04-22 NOTE — Assessment & Plan Note (Signed)
Her pain is well controlled with current dose of methadone I refill her prescription We discussed narcotic refill policy

## 2020-04-22 NOTE — Assessment & Plan Note (Signed)
We have extensive discussions about laxative therapy 

## 2020-04-22 NOTE — Progress Notes (Signed)
Reported Magnesium 1.4 to Dr. Gorsuch. °

## 2020-04-22 NOTE — Progress Notes (Signed)
[name] OFF[facility] PROGRESS NOTE  Patient Care Team: Ronita Hipps, MD as PCP - General (Family Medicine) Nicholaus Bloom, MD (Anesthesiology)  ASSESSMENT & PLAN:  Endometrial cancer [facility]) She has multiple different side effects from treatment with electrolyte imbalances, hypertension, acute on chronic pain and bradycardia We will proceed with treatment today Since I adjusted her blood pressure medication, bradycardia is improving Her pain is stable We will proceed with cycle 4 of pembrolizumab today I plan to see her back in 2 weeks with repeat CT imaging for objective assessment of response to therapy  Cancer associated pain Her pain is well controlled with current dose of methadone I refill her prescription We discussed narcotic refill policy  Other constipation We have extensive discussions about laxative therapy  Essential hypertension I have reviewed her blood pressure documentation from home Her blood pressure seems to fluctuate widely She will continue current prescription of blood pressure medicine We will continue to observe closely  Hypomagnesemia She will receive IV magnesium replacement   Orders Placed This Encounter  Procedures  . CT ABDOMEN PELVIS W CONTRAST    Standing Status:   Future    Standing Expiration Date:   04/22/2021    Order Specific Question:   If indicated for the ordered procedure, I authorize the administration of contrast media per Radiology protocol    Answer:   Yes    Order Specific Question:   Preferred imaging location?    Answer:   [facility]    Order Specific Question:   Radiology Contrast Protocol - do NOT remove file path    Answer:   [email or web address]    All questions were answered. The patient knows to call the clinic with any problems, questions or concerns. The total time spent in the appointment was 25 minutes encounter with patients including review of chart and various tests  results, discussions about plan of care and coordination of care plan   Heath Lark, MD 04/22/2020 12:26 PM  INTERVAL HISTORY: Please see below for problem oriented charting. 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 A Pap was done 09/02/18 showign ASC-H. She was referred onto GYN and seen by Dr. Paula Compton    09/09/2018 Procedure   She underwent endometrial biopsy   09/12/2018 Pathology Results   Endometrial biopsy positive for adenocarcinoma   09/22/2018 Imaging   Ct abdomen and pelvis showed: Diffuse endometrial thickening, consistent with primary endometrial carcinoma.  Pelvic and abdominal retroperitoneal and retrocrural lymphadenopathy, consistent with metastatic disease.  Moderate hepatic steatosis.   09/25/2018 Cancer Staging   Staging form: Corpus Uteri - Carcinoma and Carcinosarcoma, AJCC 8th Edition - Pathologic: Stage IIIC2 (pT2, pN2, cM0) - Signed by Heath Lark, MD on 02/23/2019   10/01/2018 Procedure   Successful placement of a right IJ approach Power Port with ultrasound and fluoroscopic guidance. The catheter is ready for use.   10/02/2018 Tumor Marker   Patient's tumor was tested for the following markers: CA-125 Results of the tumor marker test revealed 578   10/03/2018 -  01/16/2019 Chemotherapy   The patient had carboplatin and Taxol with dose adjustment due to neuropathy x 6 cycles   10/24/2018 Tumor Marker   Patient's tumor was tested for the following markers: CA-125 Results of the tumor  marker test revealed 296    Genetic Testing   Patient has genetic testing done for MSI/MMR. Results revealed MSI stable and MMR normal on Accession [id number].   11/14/2018 Tumor Marker   Patient's tumor was tested for the following markers: CA-125 Results of the tumor marker test revealed 81   11/27/2018 Imaging   1. Significant decrease in abdominal retroperitoneal and bilateral iliac lymphadenopathy since previous study. 2. Significant decrease in abnormal endometrial soft tissue density since prior study. 3. No new or progressive metastatic disease identified.   12/05/2018 Tumor Marker   Patient's tumor was tested for the following markers: CA-125 Results of the tumor marker test revealed 46.3   12/26/2018 Tumor Marker   Patient's tumor was tested for the following markers: CA-125 Results of the tumor marker test revealed 38.2   01/16/2019 Tumor Marker   Patient's tumor was tested for the following markers: CA-125 Results of the tumor marker test revealed 30.8   02/03/2019 Imaging   1. Numerous retroperitoneal, iliac, and pelvic lymph nodes are stable or slightly decreased in size compared to immediate prior examination dated 11/27/2018 and significantly decreased in size compared to exam dated 09/20/2018.  2.  Unchanged thickening of the endometrium.  3. Other chronic, incidental, and postoperative findings as detailed above.   02/12/2019 Imaging   1. Uterus +/- tubes/ovaries, neoplastic, cervix, bilateral fallopian tubes and ovaries - MIXED HIGH-GRADE SEROUS AND ENDOMETRIAL ADENOCARCINOMA, 6.5 CM. SEE NOTE - CARCINOMA INVOLVES ENTIRE ENDOMETRIUM, ANTERIOR AND POSTERIOR LOWER UTERINE SEGMENTS AND ANTERIOR AND POSTERIOR CERVIX. - CARCINOMA INVADES FOR A DEPTH OF 1.9 CM WHERE MYOMETRIAL THICKNESS IS 2.0 CM (GREATER THAN 50% MYOMETRIAL INVASION) - CARCINOMA IS 1 MM FROM THE CERVICAL MARGIN - NEGATIVE FOR LYMPHOVASCULAR OR PERINEURAL INVASION - BILATERAL UNREMARKABLE FALLOPIAN  TUBES AND OVARIES, NEGATIVE FOR CARCINOMA - SEE ONCOLOGY TABLE 2. Lymph node, biopsy, right pelvic - METASTATIC CARCINOMA TO ONE OF TWO LYMPH NODES (1/2) Microscopic Comment 1. (v4.1.0.0) UTERUS, CARCINOMA OR CARCINOSARCOMA Procedure: Total hysterectomy with bilateral salpingo-oophorectomy Histologic type: Mixed high-grade serous and endometrioid adenocarcinoma Histologic Grade: NA Myometrial invasion: Present Depth of invasion: 19 mm Myometrial thickness: 20 mm Uterine Serosa Involvement: Not identified Cervical stromal involvement: Present Extent of involvement of other organs: NA Lymphovascular invasion: Not identified Regional Lymph Nodes: Examined: 0 Sentinel 2 Non-sentinel 2 Total Lymph nodes with metastasis: 1 Isolated tumor cells (< 0.2 mm): 0 Micrometastasis: (> 0.2 mm and < 2.0 mm): 1 Macrometastasis: (> 2.0 mm): 0 Extracapsular extension: Not identified Tumor block for ancillary studies: 78F MMR / MSI testing: Pending Pathologic Stage Classification (pTNM, AJCC 8th edition): pT2, pN59m FIGO Stage: IIIC1 Representative Tumor Block: 1A, 1C (v4.1.0.0) Diagnosis Note 1. Immunohistochemical stains for p16, p53, ER and Ki-67 show a pattern of staining consistent with mixed high-grade serous and endometrial adenocarcinoma. Dr. KLyndon Codehas reviewed this case and concurs with the above interpretation. A molecular study for microsatellite instability (MSI) and immunostains for MMR-related proteins are pending and will be reported in an addendum.   02/12/2019 Surgery   Pre-operative Diagnosis: endometrial cancer stage IIIC2, s/p neoadjuvant chemotherapy, extreme obesity (BMI 53kg/m2)  Operation: Robotic-assisted laparoscopic total hysterectomy with bilateral salpingoophorectomy, right pelvic lymphadenectomy. 22 modifier for extreme obesity  Surgeon: RDonaciano Eva Assistant Surgeon: LChrissie Noa  Moore MD  Operative Findings:  : extreme obesity, BMI 53kg/m2, with  significant retroperitoneal and intraperitoneal adiposity. Obesity necessitated an additional hour of operating time to create safe exposure. Obesity required additional personnel in the operating room for positioning and retraction. Severe obesity substantially increased the complexity of the procedure. 8cm uterus, grossly normal, normal appearing cervix. Retroperitoneal fibrosis consistent with nodal positivity. Clinically suspicious right pelvic lymph node. Unable to completely visualize retroperitoneal nodes due to extreme obesity.    02/12/2019 Pathology Results   IMMUNOHISTOCHEMICAL AND MORPHOMETRIC ANALYSIS PERFORMED MANUALLY The tumor cells are POSITIVE for Her2 (3+). Estrogen Receptor: 70%, POSITIVE, STRONG STAINING INTENSITY Progesterone Receptor: 70%, POSITIVE, STRONG STAINING INTENSITY Proliferation Marker Ki67: 20% REFERENCE RANGE ESTROGEN RECEPTOR NEGATIVE 0% POSITIVE =>1% REFERENCE RANGE PROGESTERONE RECEPTOR NEGATIVE 0% POSITIVE =>1% All controls stained appropriately  1. Uterus +/- tubes/ovaries, neoplastic, cervix, bilateral fallopian tubes and ovaries - MIXED HIGH-GRADE SEROUS AND ENDOMETRIAL ADENOCARCINOMA, 6.5 CM. SEE NOTE - CARCINOMA INVOLVES ENTIRE ENDOMETRIUM, ANTERIOR AND POSTERIOR LOWER UTERINE SEGMENTS AND ANTERIOR AND POSTERIOR CERVIX. - CARCINOMA INVADES FOR A DEPTH OF 1.9 CM WHERE MYOMETRIAL THICKNESS IS 2.0 CM (GREATER THAN 50% MYOMETRIAL INVASION) - CARCINOMA IS 1 MM FROM THE CERVICAL MARGIN - NEGATIVE FOR LYMPHOVASCULAR OR PERINEURAL INVASION - BILATERAL UNREMARKABLE FALLOPIAN TUBES AND OVARIES, NEGATIVE FOR CARCINOMA - SEE ONCOLOGY TABLE 2. Lymph node, biopsy, right pelvic - METASTATIC CARCINOMA TO ONE OF TWO LYMPH NODES (1/2) Microscopic Comment 1. (v4.1.0.0) UTERUS, CARCINOMA OR CARCINOSARCOMA Procedure: Total hysterectomy with bilateral salpingo-oophorectomy Histologic type: Mixed high-grade serous and endometrioid adenocarcinoma Histologic Grade:  NA Myometrial invasion: Present Depth of invasion: 19 mm Myometrial thickness: 20 mm Uterine Serosa Involvement: Not identified Cervical stromal involvement: Present Extent of involvement of other organs: NA Lymphovascular invasion: Not identified Regional Lymph Nodes: Examined: 0 Sentinel 2 Non-sentinel 2 Total Lymph nodes with metastasis: 1 Isolated tumor cells (< 0.2 mm): 0 Micrometastasis: (> 0.2 mm and < 2.0 mm): 1 Macrometastasis: (> 2.0 mm): 0 Extracapsular extension: Not identified Tumor block for ancillary studies: 69F MMR / MSI testing: Pending Pathologic Stage Classification (pTNM, AJCC 8th edition): pT2, pN25m FIGO Stage: IIIC1 Representative Tumor Block: 1A, 1C   03/17/2019 Echocardiogram   1. The left ventricle has normal systolic function, with an ejection fraction of 55-60%. The cavity size was normal. There is moderate concentric left ventricular hypertrophy. Left ventricular diastolic Doppler parameters are consistent with impaired relaxation.  2. The right ventricle has normal systolic function. The cavity was normal. There is no increase in right ventricular wall thickness.  3. GLS: -15.5%, no prior study available for comparison.   03/17/2019 Tumor Marker   Patient's tumor was tested for the following markers: CA-125 Results of the tumor marker test revealed 43   03/20/2019 -  Chemotherapy   The patient had trastuzumab for chemotherapy treatment.     04/30/2019 Tumor Marker   Patient's tumor was tested for the following markers:CA-125 Results of the tumor marker test revealed 29   05/20/2019 Imaging   1. No evidence of metastatic disease. 2. Small pelvic and left paracolic gutter ascites, new. 3. Aortic atherosclerosis (ICD10-170.0). Coronary artery calcification.     05/26/2019 Echocardiogram      1. The left ventricle has normal systolic function with an ejection fraction of 60-65%. The cavity size was normal. There is mildly increased left ventricular wall  thickness. Left ventricular diastolic parameters were normal.  2. GLS -16.4% (underestimated due to poor endocardial tracking).  3. The right ventricle has normal systolic  function. The cavity was normal. There is no increase in right ventricular wall thickness.  4. Mild calcification of the mitral valve leaflet.  5. Mild calcification of the aortic valve. No stenosis of the aortic valve.  6. The aorta is normal in size and structure.   09/02/2019 Imaging   No evidence of recurrent or metastatic carcinoma within the abdomen or pelvis.   Colonic diverticulosis. No radiographic evidence of diverticulitis.   Increased large stool burden noted; recommend clinical correlation for possible constipation.   09/02/2019 Echocardiogram   1. Left ventricular ejection fraction, by visual estimation, is 60 to 65%. The left ventricle has normal function. There is no left ventricular hypertrophy.  2. Global right ventricle has normal systolic function.The right ventricular size is normal. No increase in right ventricular wall thickness.  3. Left atrial size was normal.  4. Right atrial size was normal.  5. The mitral valve is normal in structure. No evidence of mitral valve regurgitation.  6. The tricuspid valve is normal in structure. Tricuspid valve regurgitation is trivial.  7. The aortic valve is normal in structure. Aortic valve regurgitation is not visualized.  8. The pulmonic valve was normal in structure. Pulmonic valve regurgitation is trivial.  9. Mildly elevated pulmonary artery systolic pressure. 10. The average left ventricular global longitudinal strain is -26.9 %.   09/03/2019 Tumor Marker   Patient's tumor was tested for the following markers: CA-125 Results of the tumor marker test revealed 13.5   11/05/2019 Tumor Marker   Patient's tumor was tested for the following markers: CA-125 Results of the tumor marker test revealed 27.   11/25/2019 Imaging   1. New omental nodules  suspicious for early peritoneal spread of malignancy. 2. New thickening along the right paracolic gutter concerning for potential tumor. 3. Other imaging findings of potential clinical significance: Mild cardiomegaly. Stable lateral segment left hepatic lobe hypodense lesions, likely cysts. Multilevel posterior decompression with multilevel foraminal impingement. 4. 4 mm left lower lobe pulmonary nodule not readily appreciable on the prior exam, could be inflammatory or neoplastic. Surveillance suggested. 5. Other imaging findings of potential clinical significance: Mild cardiomegaly. Suspected hepatic cysts. Multilevel lumbar impingement.   Aortic Atherosclerosis (ICD10-I70.0).   11/26/2019 Tumor Marker   Patient's tumor was tested for the following markers: CA-125 Results of the tumor marker test revealed 129   12/11/2019 -  Chemotherapy   The patient had carboplatin and taxol for chemotherapy treatment.     01/01/2020 Tumor Marker   Patient's tumor was tested for the following markers: CA-125 Results of the tumor marker test revealed 123   01/22/2020 Tumor Marker   Patient's tumor was tested for the following markers: CA-125 Results of the tumor marker test revealed 109.   02/11/2020 Imaging   1. Interval worsening of disease as evidenced by new and enlarging peritoneal and omental nodularity. 2. Slight enlargement of retroperitoneal lymph nodes. 3. New small area of low attenuation along the medial margin of the spleen, potentially small capsular implant not seen on the previous study.   Aortic Atherosclerosis (ICD10-I70.0).     02/19/2020 -  Chemotherapy   The patient had pembrolizumab plus Lenvatinib for chemotherapy treatment.     02/19/2020 Tumor Marker   Patient's tumor was tested for the following markers: CA-125 Results of the tumor marker test revealed 308   04/01/2020 Tumor Marker   Patient's tumor was tested for the following markers: CA-125 Results of the tumor marker  test revealed 375     REVIEW  OF SYSTEMS:   Constitutional: Denies fevers, chills or abnormal weight loss Eyes: Denies blurriness of vision Ears, nose, mouth, throat, and face: Denies mucositis or sore throat Respiratory: Denies cough, dyspnea or wheezes Cardiovascular: Denies palpitation, chest discomfort or lower extremity swelling Skin: Denies abnormal skin rashes Lymphatics: Denies new lymphadenopathy or easy bruising Neurological:Denies numbness, tingling or new weaknesses Behavioral/Psych: Mood is stable, no new changes  All other systems were reviewed with the patient and are negative.  I have reviewed the past medical history, past surgical history, social history and family history with the patient and they are unchanged from previous note.  ALLERGIES:  is allergic to tegaderm ag mesh [silver].  MEDICATIONS:  Current Outpatient Medications  Medication Sig Dispense Refill  . diphenhydrAMINE (BENADRYL) 25 mg capsule Take 100 mg by mouth 2 (two) times a day.    . furosemide (LASIX) 20 MG tablet Take 1 tablet (20 mg total) by mouth daily. 30 tablet 11  . gabapentin (NEURONTIN) 400 MG capsule Take 3 capsules (1,200 mg total) by mouth 3 (three) times daily. 270 capsule 11  . lenvatinib 20 mg daily dose (LENVIMA) 2 x 10 MG capsule Take 2 capsules (20 mg total) by mouth daily. 60 capsule 11  . levothyroxine (SYNTHROID) 50 MCG tablet Take 1 tablet (50 mcg total) by mouth daily before breakfast. (Patient taking differently: Take 50 mcg by mouth daily before breakfast. Separate from magnesium supplementation by at least 4 hours.) 30 tablet 11  . lidocaine-prilocaine (EMLA) cream Apply to affected area once 30 g 3  . loratadine (CLARITIN) 10 MG tablet Take 10 mg by mouth daily.    Marland Kitchen losartan (COZAAR) 100 MG tablet Take 1.5 tablets (150 mg total) by mouth daily. 90 tablet 2  . magnesium oxide (MAG-OX) 400 (241.3 Mg) MG tablet Take 1 tablet (400 mg total) by mouth daily. 30 tablet 3  .  methadone (DOLOPHINE) 10 MG tablet Take 2 tablets (20 mg total) by mouth every 8 (eight) hours. 90 tablet 0  . ondansetron (ZOFRAN) 8 MG tablet Take 8 mg by mouth every 8 (eight) hours as needed.    Marland Kitchen oxyCODONE-acetaminophen (PERCOCET) 10-325 MG tablet Take 1-2 tablets by mouth every 6 (six) hours as needed for pain. 90 tablet 0  . prochlorperazine (COMPAZINE) 10 MG tablet TAKE 1 TABLET BY MOUTH EVERY 6 HOURS AS NEEDED FOR NAUSEA OR VOMITING 30 tablet 1  . ranitidine (ZANTAC) 150 MG capsule Take 150 mg by mouth 2 (two) times daily.    . rivaroxaban (XARELTO) 20 MG TABS tablet Take 1 tablet (20 mg total) by mouth daily with supper. 30 tablet 9   No current facility-administered medications for this visit.    PHYSICAL EXAMINATION: ECOG PERFORMANCE STATUS: 1 - Symptomatic but completely ambulatory  Vitals:   04/22/20 1143  BP: (!) 163/91  Pulse: (!) 59  Resp: 18  Temp: 98.2 F (36.8 C)  SpO2: 100%   Filed Weights   04/22/20 1143  Weight: 274 lb (124.3 kg)    GENERAL:alert, no distress and comfortable NEURO: alert & oriented x 3 with fluent speech, no focal motor/sensory deficits  LABORATORY DATA:  I have reviewed the data as listed    Component Value Date/Time   NA 140 04/22/2020 1126   K 3.6 04/22/2020 1126   CL 101 04/22/2020 1126   CO2 30 04/22/2020 1126   GLUCOSE 115 (H) 04/22/2020 1126   BUN 13 04/22/2020 1126   CREATININE 0.78 04/22/2020 1126   CALCIUM 9.2  04/22/2020 1126   PROT 7.3 04/22/2020 1126   ALBUMIN 3.6 04/22/2020 1126   AST 19 04/22/2020 1126   ALT 15 04/22/2020 1126   ALKPHOS 118 04/22/2020 1126   BILITOT 0.5 04/22/2020 1126   GFRNONAA >60 04/22/2020 1126   GFRAA >60 04/22/2020 1126    No results found for: SPEP, UPEP  Lab Results  Component Value Date   WBC 4.2 04/22/2020   NEUTROABS 2.1 04/22/2020   HGB 12.2 04/22/2020   HCT 37.7 04/22/2020   MCV 105.3 (H) 04/22/2020   PLT 136 (L) 04/22/2020      Chemistry      Component Value  Date/Time   NA 140 04/22/2020 1126   K 3.6 04/22/2020 1126   CL 101 04/22/2020 1126   CO2 30 04/22/2020 1126   BUN 13 04/22/2020 1126   CREATININE 0.78 04/22/2020 1126      Component Value Date/Time   CALCIUM 9.2 04/22/2020 1126   ALKPHOS 118 04/22/2020 1126   AST 19 04/22/2020 1126   ALT 15 04/22/2020 1126   BILITOT 0.5 04/22/2020 1126

## 2020-04-22 NOTE — Patient Instructions (Signed)
[facility] Discharge Instructions for Patients Receiving Chemotherapy  Today you received the following chemotherapy agents Pembrolizumab [facility]).  To help prevent nausea and vomiting after your treatment, we encourage you to take your nausea medication as prescribed.   If you develop nausea and vomiting that is not controlled by your nausea medication, call the clinic.   BELOW ARE SYMPTOMS THAT SHOULD BE REPORTED IMMEDIATELY:  *FEVER GREATER THAN 100.5 F  *CHILLS WITH OR WITHOUT FEVER  NAUSEA AND VOMITING THAT IS NOT CONTROLLED WITH YOUR NAUSEA MEDICATION  *UNUSUAL SHORTNESS OF BREATH  *UNUSUAL BRUISING OR BLEEDING  TENDERNESS IN MOUTH AND THROAT WITH OR WITHOUT PRESENCE OF ULCERS  *URINARY PROBLEMS  *BOWEL PROBLEMS  UNUSUAL RASH Items with * indicate a potential emergency and should be followed up as soon as possible.  Feel free to call the clinic should you have any questions or concerns. The clinic phone number is [phone].  Please show the [facility] at check-in to the Emergency Department and triage nurse.  Magnesium Sulfate injection What is this medicine? MAGNESIUM SULFATE (mag NEE zee um SUL fate) is an electrolyte injection commonly used to treat low magnesium levels in your blood. It is also used to prevent or control seizures in women with preeclampsia or eclampsia. This medicine may be used for other purposes; ask your health care provider or pharmacist if you have questions. What should I tell my health care provider before I take this medicine? They need to know if you have any of these conditions:  heart disease  history of irregular heart beat  kidney disease  an unusual or allergic reaction to magnesium sulfate, medicines, foods, dyes, or preservatives  pregnant or trying to get pregnant  breast-feeding How should I use this medicine? This medicine is for infusion into a vein. It is given by a health care  professional in a hospital or clinic setting. Talk to your pediatrician regarding the use of this medicine in children. While this drug may be prescribed for selected conditions, precautions do apply. Overdosage: If you think you have taken too much of this medicine contact a poison control center or emergency room at once. NOTE: This medicine is only for you. Do not share this medicine with others. What if I miss a dose? This does not apply. What may interact with this medicine? This medicine may interact with the following medications:  certain medicines for anxiety or sleep  certain medicines for seizures like phenobarbital  digoxin  medicines that relax muscles for surgery  narcotic medicines for pain This list may not describe all possible interactions. Give your health care provider a list of all the medicines, herbs, non-prescription drugs, or dietary supplements you use. Also tell them if you smoke, drink alcohol, or use illegal drugs. Some items may interact with your medicine. What should I watch for while using this medicine? Your condition will be monitored carefully while you are receiving this medicine. You may need blood work done while you are receiving this medicine. What side effects may I notice from receiving this medicine? Side effects that you should report to your doctor or health care professional as soon as possible:  allergic reactions like skin rash, itching or hives, swelling of the face, lips, or tongue  facial flushing  muscle weakness  signs and symptoms of low blood pressure like dizziness; feeling faint or lightheaded, falls; unusually weak or tired  signs and symptoms of a dangerous change in heartbeat or heart  rhythm like chest pain; dizziness; fast or irregular heartbeat; palpitations; breathing problems  sweating This list may not describe all possible side effects. Call your doctor for medical advice about side effects. You may report side  effects to FDA at 1-800-FDA-1088. Where should I keep my medicine? This drug is given in a hospital or clinic and will not be stored at home. NOTE: This sheet is a summary. It may not cover all possible information. If you have questions about this medicine, talk to your doctor, pharmacist, or health care provider.  2020 Elsevier/Gold Standard (2016-04-18 12:31:42)

## 2020-04-23 ENCOUNTER — Ambulatory Visit: Payer: Medicare Other

## 2020-04-23 LAB — CA 125: Cancer Antigen (CA) 125: 592 U/mL — ABNORMAL HIGH (ref 0.0–38.1)

## 2020-04-23 LAB — T4: T4, Total: 9.7 ug/dL (ref 4.5–12.0)

## 2020-05-03 ENCOUNTER — Ambulatory Visit (HOSPITAL_COMMUNITY)
Admission: RE | Admit: 2020-05-03 | Discharge: 2020-05-03 | Disposition: A | Discharge status: Home / Self Care | Payer: Medicare Other | Source: Ambulatory Visit | Attending: Hematology and Oncology | Admitting: Hematology and Oncology

## 2020-05-03 ENCOUNTER — Other Ambulatory Visit: Payer: Self-pay

## 2020-05-03 ENCOUNTER — Encounter (HOSPITAL_COMMUNITY): Payer: Self-pay

## 2020-05-03 DIAGNOSIS — C541 Malignant neoplasm of endometrium: Secondary | ICD-10-CM | POA: Diagnosis not present

## 2020-05-03 MED ORDER — HEPARIN SOD (PORK) LOCK FLUSH 100 UNIT/ML IV SOLN
INTRAVENOUS | Status: AC
  Filled 2020-05-03: qty 5

## 2020-05-03 MED ORDER — HEPARIN SOD (PORK) LOCK FLUSH 100 UNIT/ML IV SOLN
500.0000 [IU] | Freq: Once | INTRAVENOUS | Status: AC
  Administered 2020-05-03: 500 [IU] via INTRAVENOUS

## 2020-05-03 MED ORDER — IOHEXOL 300 MG/ML  SOLN
100.0000 mL | Freq: Once | INTRAMUSCULAR | Status: AC | PRN
  Administered 2020-05-03: 100 mL via INTRAVENOUS

## 2020-05-03 MED ORDER — SODIUM CHLORIDE (PF) 0.9 % IJ SOLN
INTRAMUSCULAR | Status: AC
  Filled 2020-05-03: qty 50

## 2020-05-04 ENCOUNTER — Encounter: Payer: Self-pay | Admitting: Hematology and Oncology

## 2020-05-04 ENCOUNTER — Other Ambulatory Visit: Payer: Self-pay

## 2020-05-04 ENCOUNTER — Telehealth: Payer: Self-pay | Admitting: Hematology and Oncology

## 2020-05-04 ENCOUNTER — Telehealth: Payer: Self-pay

## 2020-05-04 ENCOUNTER — Inpatient Hospital Stay: Payer: Medicare Other | Admitting: Hematology and Oncology

## 2020-05-04 VITALS — BP 153/78 | HR 60 | Temp 98.2°F | Resp 18 | Ht 63.0 in | Wt 274.0 lb

## 2020-05-04 DIAGNOSIS — C541 Malignant neoplasm of endometrium: Secondary | ICD-10-CM | POA: Diagnosis not present

## 2020-05-04 DIAGNOSIS — K5909 Other constipation: Secondary | ICD-10-CM | POA: Diagnosis not present

## 2020-05-04 DIAGNOSIS — Z7189 Other specified counseling: Secondary | ICD-10-CM

## 2020-05-04 DIAGNOSIS — G893 Neoplasm related pain (acute) (chronic): Secondary | ICD-10-CM

## 2020-05-04 DIAGNOSIS — R001 Bradycardia, unspecified: Secondary | ICD-10-CM | POA: Diagnosis not present

## 2020-05-04 DIAGNOSIS — Z5112 Encounter for antineoplastic immunotherapy: Secondary | ICD-10-CM | POA: Diagnosis not present

## 2020-05-04 DIAGNOSIS — I10 Essential (primary) hypertension: Secondary | ICD-10-CM | POA: Diagnosis not present

## 2020-05-04 NOTE — Progress Notes (Signed)
Fall witnessed by family accompanying patient. Not witnessed by staff. Patient states she was ambulating with her son while leaving facility. States she realized she was going to fall but was unable to stop it. Denied dizziness or sycope. States she fell backwards and lightly struck head on floor. No loss of consciousness. Denied pain/injury at time of assessment. No obvious injury identified on physical assessment. Patient was assisted to standing position and assisted to chair without difficulty. Sandi Mealy, PA-C came to lobby to assess patient. Patient declined further treatment and was assisted to vehicle via wheelchair without further incident.

## 2020-05-04 NOTE — Assessment & Plan Note (Signed)
We have extensive discussions about laxative therapy 

## 2020-05-04 NOTE — Progress Notes (Signed)
[place] OFFICE PROGRESS NOTE  Patient Care Team: Ronita Hipps, MD as PCP - General (Family Medicine) Nicholaus Bloom, MD (Anesthesiology)  ASSESSMENT & PLAN:  Endometrial cancer [facility]) I reviewed imaging studies with the patient and family Unfortunately, she has significant signs of cancer progression Overall, she has progressed on carboplatin, paclitaxel, Herceptin, lenvatinib and pembrolizumab We reviewed the current guidelines We discussed treatment options including liposomal doxorubicin, topotecan or docetaxel The risks, benefits, side effects of each option were fully discussed and ultimately she made decision to proceed with liposomal doxorubicin Her treatment option is based on the following publication:  Phase II trial of the pegylated liposomal doxorubicin in previously treated metastatic endometrial cancer: a [employer]nec[employer] study by Camille Bal 1, Henrene Dodge, Gabriela Eves, Danie Binder  PMID: [id number] DOI: 10.1200/JCO.2002.08.171 Abstract Purpose: To determine whether pegylated liposomal doxorubicin (PLD) has antitumor activity in pretreated patients with persistent or recurrent endometrial carcinoma and to define the nature and degree of toxicity of PLD.  Patients and methods: Women with histologically documented recurrent or persistent measurable endometrial carcinoma and with failure of one prior treatment regardless of prior anthracycline therapy were enrolled. PLD was administered intravenously over a 1-hour period at a dose of 50 mg/m(2) every 4 weeks; the dosage was modified in accordance with observed toxicity.  Results: Of 46 patients entered, 42 were assessable for response, as three were declared ineligible on central pathology review and one was not assessable for response. 33 had received prior chemotherapy, 11 hormonal therapy, and 29 radiation therapy. Doxorubicin had been given to 32 patients, carboplatin with paclitaxel to  six, carboplatin to one, and fluorouracil to one. Four patients had partial responses lasting 1.1, 2.1, 3.3, and 5.4 months; the overall response rate was 9.5% (95% confidence interval, 2.7% to 22.6%). Three of these responses (in liver and in lymph node) occurred in patients who had progressed after doxorubicin with either paclitaxel or cisplatin. The median number of courses was 2.5 (range, one to 14). Toxicity was generally mild: only 25 patients experienced leukopenia, with a median WBC count of 2,900 (range, 800 to 3,900) at nadir. The only grade 4 toxicities were one episode each of esophagitis, hematuria, and vomiting. The median overall survival was 8.2 months.  Conclusion: PLD has only limited activity in pretreated advanced, recurrent endometrial cancer, but further trials in anthracycline-naive patients and in previously untreated patients are ongoing. Its toxicity profile should permit its use in combination with myelosuppressive drugs.  Treatment goal is palliative We discussed the risk, benefits, side effects including risk of pancytopenia, infection, congestive heart failure, hand-foot syndrome, nausea with vomiting, mucositis, etc. and she is in agreement to proceed I will obtain baseline echocardiogram before we proceed with therapy I will see her prior to cycle 2 of treatment for assessment of toxicity I recommend minimum 3 cycles before repeating imaging study  Cancer associated pain Her pain is well controlled with current dose of methadone We discussed narcotic refill policy  Other constipation We have extensive discussions about laxative therapy  Goals of care, counseling/discussion She understood goals of care is palliative in nature We discussed the importance of advanced directives and living will We discussed prognosis with or without treatment She has advanced directives and living will She does not want to be resuscitated in the event of terminal illness   Orders  Placed This Encounter  Procedures  . CBC with Differential (Cancer Center Only)    Standing Status:   Standing  Number of Occurrences:   20    Standing Expiration Date:   05/04/2021  . CMP ([place] only)    Standing Status:   Standing    Number of Occurrences:   20    Standing Expiration Date:   05/04/2021  . ECHOCARDIOGRAM COMPLETE    Standing Status:   Future    Standing Expiration Date:   05/04/2021    Order Specific Question:   Where should this test be performed    Answer:   [facility]    Order Specific Question:   Perflutren DEFINITY (image enhancing agent) should be administered unless hypersensitivity or allergy exist    Answer:   Administer Perflutren    Order Specific Question:   Reason for exam-Echo    Answer:   Chemotherapy evaluation  v87.41 / v58.11    All questions were answered. The patient knows to call the clinic with any problems, questions or concerns. The total time spent in the appointment was 60 minutes encounter with patients including review of chart and various tests results, discussions about plan of care and coordination of care plan   Heath Lark, MD 05/04/2020 1:57 PM  INTERVAL HISTORY: Please see below for problem oriented charting. 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 A Pap was done 09/02/18 showign ASC-H. She was referred onto GYN and seen by Dr. Paula Compton    09/09/2018 Procedure   She underwent endometrial biopsy   09/12/2018 Pathology Results   Endometrial biopsy positive for adenocarcinoma   09/22/2018 Imaging   Ct abdomen and pelvis showed: Diffuse endometrial thickening, consistent with primary endometrial carcinoma.  Pelvic and abdominal retroperitoneal and retrocrural lymphadenopathy, consistent with metastatic disease.  Moderate hepatic steatosis.   09/25/2018 Cancer Staging   Staging form: Corpus Uteri - Carcinoma and Carcinosarcoma, AJCC 8th Edition - Pathologic: Stage IIIC2 (pT2, pN2, cM0) - Signed by Heath Lark, MD on 02/23/2019   10/01/2018 Procedure   Successful placement of a right IJ approach Power Port with ultrasound and fluoroscopic guidance. The catheter is ready for use.   10/02/2018 Tumor Marker   Patient's tumor was tested for the following markers: CA-125 Results of the tumor marker test revealed 578   10/03/2018 - 01/16/2019 Chemotherapy   The patient had carboplatin and Taxol with dose adjustment due to neuropathy x 6 cycles   10/24/2018 Tumor Marker   Patient's tumor was tested for the following markers: CA-125 Results of the tumor marker test revealed 296    Genetic Testing   Patient has genetic testing done for MSI/MMR. Results revealed MSI stable and MMR normal on [employer] [id number].   11/14/2018 Tumor Marker   Patient's tumor was tested for the following markers: CA-125 Results of the tumor marker test revealed 81   11/27/2018 Imaging   1. Significant decrease in abdominal retroperitoneal and bilateral  iliac lymphadenopathy since previous study. 2. Significant decrease in abnormal endometrial soft tissue density since prior study. 3. No new or progressive metastatic disease identified.   12/05/2018 Tumor Marker   Patient's tumor was tested for the following markers: CA-125 Results of the  tumor marker test revealed 46.3   12/26/2018 Tumor Marker   Patient's tumor was tested for the following markers: CA-125 Results of the tumor marker test revealed 38.2   01/16/2019 Tumor Marker   Patient's tumor was tested for the following markers: CA-125 Results of the tumor marker test revealed 30.8   02/03/2019 Imaging   1. Numerous retroperitoneal, iliac, and pelvic lymph nodes are stable or slightly decreased in size compared to immediate prior examination dated 11/27/2018 and significantly decreased in size compared to exam dated 09/20/2018.  2.  Unchanged thickening of the endometrium.  3. Other chronic, incidental, and postoperative findings as detailed above.   02/12/2019 Imaging   1. Uterus +/- tubes/ovaries, neoplastic, cervix, bilateral fallopian tubes and ovaries - MIXED HIGH-GRADE SEROUS AND ENDOMETRIAL ADENOCARCINOMA, 6.5 CM. SEE NOTE - CARCINOMA INVOLVES ENTIRE ENDOMETRIUM, ANTERIOR AND POSTERIOR LOWER UTERINE SEGMENTS AND ANTERIOR AND POSTERIOR CERVIX. - CARCINOMA INVADES FOR A DEPTH OF 1.9 CM WHERE MYOMETRIAL THICKNESS IS 2.0 CM (GREATER THAN 50% MYOMETRIAL INVASION) - CARCINOMA IS 1 MM FROM THE CERVICAL MARGIN - NEGATIVE FOR LYMPHOVASCULAR OR PERINEURAL INVASION - BILATERAL UNREMARKABLE FALLOPIAN TUBES AND OVARIES, NEGATIVE FOR CARCINOMA - SEE ONCOLOGY TABLE 2. Lymph node, biopsy, right pelvic - METASTATIC CARCINOMA TO ONE OF TWO LYMPH NODES (1/2) Microscopic Comment 1. (v4.1.0.0) UTERUS, CARCINOMA OR CARCINOSARCOMA Procedure: Total hysterectomy with bilateral salpingo-oophorectomy Histologic type: Mixed high-grade serous and endometrioid adenocarcinoma Histologic Grade: NA Myometrial invasion: Present Depth of invasion: 19 mm Myometrial thickness: 20 mm Uterine Serosa Involvement: Not identified Cervical stromal involvement: Present Extent of involvement of other organs: NA Lymphovascular invasion: Not identified Regional Lymph Nodes: Examined: 0  Sentinel 2 Non-sentinel 2 Total Lymph nodes with metastasis: 1 Isolated tumor cells (< 0.2 mm): 0 Micrometastasis: (> 0.2 mm and < 2.0 mm): 1 Macrometastasis: (> 2.0 mm): 0 Extracapsular extension: Not identified Tumor block for ancillary studies: 75F MMR / MSI testing: Pending Pathologic Stage Classification (pTNM, AJCC 8th edition): pT2, pN73m FIGO Stage: IIIC1 Representative Tumor Block: 1A, 1C (v4.1.0.0) Diagnosis Note 1. Immunohistochemical stains for p16, p53, ER and Ki-67 show a pattern of staining consistent with mixed high-grade serous and endometrial adenocarcinoma. Dr. KLyndon Codehas reviewed this case and concurs with the above interpretation. A molecular study for microsatellite instability (MSI) and immunostains for MMR-related proteins are pending and will be reported in an addendum.   02/12/2019 Surgery   Pre-operative Diagnosis: endometrial cancer stage IIIC2, s/p neoadjuvant chemotherapy, extreme obesity (BMI 53kg/m2)  Operation: Robotic-assisted laparoscopic total hysterectomy with bilateral salpingoophorectomy, right pelvic lymphadenectomy. 22 modifier for extreme obesity  Surgeon: RDonaciano Eva Assistant Surgeon: LLahoma CrockerMD  Operative Findings:  : extreme obesity, BMI 53kg/m2, with significant retroperitoneal and intraperitoneal adiposity. Obesity necessitated an additional hour of operating time to create safe exposure. Obesity required additional personnel in the operating room for positioning and retraction. Severe obesity substantially increased the complexity of the procedure. 8cm uterus, grossly normal, normal appearing cervix. Retroperitoneal fibrosis consistent with nodal positivity. Clinically suspicious right pelvic lymph node. Unable to completely visualize retroperitoneal nodes due to extreme obesity.    02/12/2019 Pathology Results   IMMUNOHISTOCHEMICAL AND MORPHOMETRIC ANALYSIS PERFORMED MANUALLY The tumor cells are POSITIVE for Her2  (3+). Estrogen Receptor: 70%,  POSITIVE, STRONG STAINING INTENSITY Progesterone Receptor: 70%, POSITIVE, STRONG STAINING INTENSITY Proliferation Marker Ki67: 20% REFERENCE RANGE ESTROGEN RECEPTOR NEGATIVE 0% POSITIVE =>1% REFERENCE RANGE PROGESTERONE RECEPTOR NEGATIVE 0% POSITIVE =>1% All controls stained appropriately  1. Uterus +/- tubes/ovaries, neoplastic, cervix, bilateral fallopian tubes and ovaries - MIXED HIGH-GRADE SEROUS AND ENDOMETRIAL ADENOCARCINOMA, 6.5 CM. SEE NOTE - CARCINOMA INVOLVES ENTIRE ENDOMETRIUM, ANTERIOR AND POSTERIOR LOWER UTERINE SEGMENTS AND ANTERIOR AND POSTERIOR CERVIX. - CARCINOMA INVADES FOR A DEPTH OF 1.9 CM WHERE MYOMETRIAL THICKNESS IS 2.0 CM (GREATER THAN 50% MYOMETRIAL INVASION) - CARCINOMA IS 1 MM FROM THE CERVICAL MARGIN - NEGATIVE FOR LYMPHOVASCULAR OR PERINEURAL INVASION - BILATERAL UNREMARKABLE FALLOPIAN TUBES AND OVARIES, NEGATIVE FOR CARCINOMA - SEE ONCOLOGY TABLE 2. Lymph node, biopsy, right pelvic - METASTATIC CARCINOMA TO ONE OF TWO LYMPH NODES (1/2) Microscopic Comment 1. (v4.1.0.0) UTERUS, CARCINOMA OR CARCINOSARCOMA Procedure: Total hysterectomy with bilateral salpingo-oophorectomy Histologic type: Mixed high-grade serous and endometrioid adenocarcinoma Histologic Grade: NA Myometrial invasion: Present Depth of invasion: 19 mm Myometrial thickness: 20 mm Uterine Serosa Involvement: Not identified Cervical stromal involvement: Present Extent of involvement of other organs: NA Lymphovascular invasion: Not identified Regional Lymph Nodes: Examined: 0 Sentinel 2 Non-sentinel 2 Total Lymph nodes with metastasis: 1 Isolated tumor cells (< 0.2 mm): 0 Micrometastasis: (> 0.2 mm and < 2.0 mm): 1 Macrometastasis: (> 2.0 mm): 0 Extracapsular extension: Not identified Tumor block for ancillary studies: 20F MMR / MSI testing: Pending Pathologic Stage Classification (pTNM, AJCC 8th edition): pT2, pN31m FIGO Stage: IIIC1 Representative  Tumor Block: 1A, 1C   03/17/2019 Echocardiogram   1. The left ventricle has normal systolic function, with an ejection fraction of 55-60%. The cavity size was normal. There is moderate concentric left ventricular hypertrophy. Left ventricular diastolic Doppler parameters are consistent with impaired relaxation.  2. The right ventricle has normal systolic function. The cavity was normal. There is no increase in right ventricular wall thickness.  3. GLS: -15.5%, no prior study available for comparison.   03/17/2019 Tumor Marker   Patient's tumor was tested for the following markers: CA-125 Results of the tumor marker test revealed 43   03/20/2019 -  Chemotherapy   The patient had trastuzumab for chemotherapy treatment.     04/30/2019 Tumor Marker   Patient's tumor was tested for the following markers:CA-125 Results of the tumor marker test revealed 29   05/20/2019 Imaging   1. No evidence of metastatic disease. 2. Small pelvic and left paracolic gutter ascites, new. 3. Aortic atherosclerosis (ICD10-170.0). Coronary artery calcification.     05/26/2019 Echocardiogram      1. The left ventricle has normal systolic function with an ejection fraction of 60-65%. The cavity size was normal. There is mildly increased left ventricular wall thickness. Left ventricular diastolic parameters were normal.  2. GLS -16.4% (underestimated due to poor endocardial tracking).  3. The right ventricle has normal systolic function. The cavity was normal. There is no increase in right ventricular wall thickness.  4. Mild calcification of the mitral valve leaflet.  5. Mild calcification of the aortic valve. No stenosis of the aortic valve.  6. The aorta is normal in size and structure.   09/02/2019 Imaging   No evidence of recurrent or metastatic carcinoma within the abdomen or pelvis.   Colonic diverticulosis. No radiographic evidence of diverticulitis.   Increased large stool burden noted; recommend clinical  correlation for possible constipation.   09/02/2019 Echocardiogram   1. Left ventricular ejection fraction, by visual estimation, is 60 to 65%.  The left ventricle has normal function. There is no left ventricular hypertrophy.  2. Global right ventricle has normal systolic function.The right ventricular size is normal. No increase in right ventricular wall thickness.  3. Left atrial size was normal.  4. Right atrial size was normal.  5. The mitral valve is normal in structure. No evidence of mitral valve regurgitation.  6. The tricuspid valve is normal in structure. Tricuspid valve regurgitation is trivial.  7. The aortic valve is normal in structure. Aortic valve regurgitation is not visualized.  8. The pulmonic valve was normal in structure. Pulmonic valve regurgitation is trivial.  9. Mildly elevated pulmonary artery systolic pressure. 10. The average left ventricular global longitudinal strain is -26.9 %.   09/03/2019 Tumor Marker   Patient's tumor was tested for the following markers: CA-125 Results of the tumor marker test revealed 13.5   11/05/2019 Tumor Marker   Patient's tumor was tested for the following markers: CA-125 Results of the tumor marker test revealed 27.   11/25/2019 Imaging   1. New omental nodules suspicious for early peritoneal spread of malignancy. 2. New thickening along the right paracolic gutter concerning for potential tumor. 3. Other imaging findings of potential clinical significance: Mild cardiomegaly. Stable lateral segment left hepatic lobe hypodense lesions, likely cysts. Multilevel posterior decompression with multilevel foraminal impingement. 4. 4 mm left lower lobe pulmonary nodule not readily appreciable on the prior exam, could be inflammatory or neoplastic. Surveillance suggested. 5. Other imaging findings of potential clinical significance: Mild cardiomegaly. Suspected hepatic cysts. Multilevel lumbar impingement.   Aortic Atherosclerosis  (ICD10-I70.0).   11/26/2019 Tumor Marker   Patient's tumor was tested for the following markers: CA-125 Results of the tumor marker test revealed 129   12/11/2019 -  Chemotherapy   The patient had carboplatin and taxol for chemotherapy treatment.     01/01/2020 Tumor Marker   Patient's tumor was tested for the following markers: CA-125 Results of the tumor marker test revealed 123   01/22/2020 Tumor Marker   Patient's tumor was tested for the following markers: CA-125 Results of the tumor marker test revealed 109.   02/11/2020 Imaging   1. Interval worsening of disease as evidenced by new and enlarging peritoneal and omental nodularity. 2. Slight enlargement of retroperitoneal lymph nodes. 3. New small area of low attenuation along the medial margin of the spleen, potentially small capsular implant not seen on the previous study.   Aortic Atherosclerosis (ICD10-I70.0).     02/19/2020 -  Chemotherapy   The patient had pembrolizumab plus Lenvatinib for chemotherapy treatment.     02/19/2020 Tumor Marker   Patient's tumor was tested for the following markers: CA-125 Results of the tumor marker test revealed 308   04/01/2020 Tumor Marker   Patient's tumor was tested for the following markers: CA-125 Results of the tumor marker test revealed 375   04/22/2020 Tumor Marker   Patient's tumor was tested for the following markers: CA-125 Results of the tumor marker test revealed 592   05/04/2020 Imaging   1. Significant interval worsening of omental nodularity and soft tissue caking in the ventral abdomen, which is now essentially confluent and not discretely measurable. There is additional nodularity in the right paracolic gutter. Findings are consistent with worsened omental and peritoneal metastatic disease. 2. There is a new, ill-defined hypodense lesion of the inferior right lobe of the liver, hepatic segment VI, measuring 2.3 x 1.5 cm, concerning for hepatic metastatic disease. 3. Status  post hysterectomy. Soft tissue nodule in  the vicinity of the right vaginal cuff is not significantly changed, measuring 2.1 x 1.4 cm. 4. Prominent retroperitoneal lymph nodes remain subcentimeter and are not significantly changed. Attention on follow-up. 5. Aortic Atherosclerosis (ICD10-I70.0).   05/13/2020 -  Chemotherapy   The patient had DOXOrubicin HCL LIPOSOMAL (DOXIL) 94 mg in dextrose 5 % 500 mL chemo infusion, 40 mg/m2, Intravenous,  Once, 0 of 6 cycles  for chemotherapy treatment.      REVIEW OF SYSTEMS:   Constitutional: Denies fevers, chills or abnormal weight loss Eyes: Denies blurriness of vision Ears, nose, mouth, throat, and face: Denies mucositis or sore throat Respiratory: Denies cough, dyspnea or wheezes Cardiovascular: Denies palpitation, chest discomfort or lower extremity swelling Skin: Denies abnormal skin rashes Lymphatics: Denies new lymphadenopathy or easy bruising Neurological:Denies numbness, tingling or new weaknesses Behavioral/Psych: Mood is stable, no new changes  All other systems were reviewed with the patient and are negative.  I have reviewed the past medical history, past surgical history, social history and family history with the patient and they are unchanged from previous note.  ALLERGIES:  is allergic to tegaderm ag mesh [silver].  MEDICATIONS:  Current Outpatient Medications  Medication Sig Dispense Refill  . diphenhydrAMINE (BENADRYL) 25 mg capsule Take 100 mg by mouth 2 (two) times a day.    . furosemide (LASIX) 20 MG tablet Take 1 tablet (20 mg total) by mouth daily. 30 tablet 11  . gabapentin (NEURONTIN) 400 MG capsule Take 3 capsules (1,200 mg total) by mouth 3 (three) times daily. 270 capsule 11  . levothyroxine (SYNTHROID) 50 MCG tablet Take 1 tablet (50 mcg total) by mouth daily before breakfast. (Patient taking differently: Take 50 mcg by mouth daily before breakfast. Separate from magnesium supplementation by at least 4 hours.) 30  tablet 11  . lidocaine-prilocaine (EMLA) cream Apply to affected area once 30 g 3  . loratadine (CLARITIN) 10 MG tablet Take 10 mg by mouth daily.    Marland Kitchen losartan (COZAAR) 100 MG tablet Take 1.5 tablets (150 mg total) by mouth daily. 90 tablet 2  . magnesium oxide (MAG-OX) 400 (241.3 Mg) MG tablet Take 1 tablet (400 mg total) by mouth daily. 30 tablet 3  . magnesium oxide (MAG-OX) 400 MG tablet Take 1 tablet by mouth daily.    . methadone (DOLOPHINE) 10 MG tablet Take 2 tablets (20 mg total) by mouth every 8 (eight) hours. 90 tablet 0  . metoprolol tartrate (LOPRESSOR) 25 MG tablet Take 25 mg by mouth 2 (two) times daily.    . ondansetron (ZOFRAN) 8 MG tablet Take 8 mg by mouth every 8 (eight) hours as needed.    Marland Kitchen oxyCODONE-acetaminophen (PERCOCET) 10-325 MG tablet Take 1-2 tablets by mouth every 6 (six) hours as needed for pain. 90 tablet 0  . prochlorperazine (COMPAZINE) 10 MG tablet TAKE 1 TABLET BY MOUTH EVERY 6 HOURS AS NEEDED FOR NAUSEA OR VOMITING 30 tablet 1  . ranitidine (ZANTAC) 150 MG capsule Take 150 mg by mouth 2 (two) times daily.    . rivaroxaban (XARELTO) 20 MG TABS tablet Take 1 tablet (20 mg total) by mouth daily with supper. 30 tablet 9  . STOOL SOFTENER 100 MG capsule Take 100 mg by mouth as needed.     No current facility-administered medications for this visit.    PHYSICAL EXAMINATION: ECOG PERFORMANCE STATUS: 1 - Symptomatic but completely ambulatory  Vitals:   05/04/20 1255  BP: (!) 153/78  Pulse: 60  Resp: 18  Temp: 98.2 F (  36.8 C)  SpO2: 100%   Filed Weights   05/04/20 1255  Weight: 274 lb (124.3 kg)    GENERAL:alert, no distress and comfortable NEURO: alert & oriented x 3 with fluent speech, no focal motor/sensory deficits  LABORATORY DATA:  I have reviewed the data as listed    Component Value Date/Time   NA 140 04/22/2020 1126   K 3.6 04/22/2020 1126   CL 101 04/22/2020 1126   CO2 30 04/22/2020 1126   GLUCOSE 115 (H) 04/22/2020 1126   BUN 13  04/22/2020 1126   CREATININE 0.78 04/22/2020 1126   CALCIUM 9.2 04/22/2020 1126   PROT 7.3 04/22/2020 1126   ALBUMIN 3.6 04/22/2020 1126   AST 19 04/22/2020 1126   ALT 15 04/22/2020 1126   ALKPHOS 118 04/22/2020 1126   BILITOT 0.5 04/22/2020 1126   GFRNONAA >60 04/22/2020 1126   GFRAA >60 04/22/2020 1126    No results found for: SPEP, UPEP  Lab Results  Component Value Date   WBC 4.2 04/22/2020   NEUTROABS 2.1 04/22/2020   HGB 12.2 04/22/2020   HCT 37.7 04/22/2020   MCV 105.3 (H) 04/22/2020   PLT 136 (L) 04/22/2020      Chemistry      Component Value Date/Time   NA 140 04/22/2020 1126   K 3.6 04/22/2020 1126   CL 101 04/22/2020 1126   CO2 30 04/22/2020 1126   BUN 13 04/22/2020 1126   CREATININE 0.78 04/22/2020 1126      Component Value Date/Time   CALCIUM 9.2 04/22/2020 1126   ALKPHOS 118 04/22/2020 1126   AST 19 04/22/2020 1126   ALT 15 04/22/2020 1126   BILITOT 0.5 04/22/2020 1126       RADIOGRAPHIC STUDIES: I have reviewed multiple imaging studies with the patient and family I have personally reviewed the radiological images as listed and agreed with the findings in the report. CT ABDOMEN PELVIS W CONTRAST  Result Date: 05/04/2020 CLINICAL DATA:  Metastatic uterine cancer, restaging EXAM: CT ABDOMEN AND PELVIS WITH CONTRAST TECHNIQUE: Multidetector CT imaging of the abdomen and pelvis was performed using the standard protocol following bolus administration of intravenous contrast. CONTRAST:  176m OMNIPAQUE IOHEXOL 300 MG/ML SOLN, additional oral enteric contrast COMPARISON:  02/11/2020, 11/25/2019 FINDINGS: Lower chest: No acute abnormality. Hepatobiliary: There is a new, ill-defined hypodense lesion of the inferior right lobe of the liver, hepatic segment VI, measuring 2.3 x 1.5 cm (series 2, image 27). Fluid attenuation cysts or hemangiomata of the left lobe of the liver. Status post cholecystectomy. Redemonstrated postoperative ductal dilatation. Pancreas:  Unremarkable. No pancreatic ductal dilatation or surrounding inflammatory changes. Spleen: Normal in size without significant abnormality. Adrenals/Urinary Tract: Adrenal glands are unremarkable. Kidneys are normal, without renal calculi, solid lesion, or hydronephrosis. Bladder is unremarkable. Stomach/Bowel: Stomach is within normal limits. Appendix appears normal. No evidence of bowel wall thickening, distention, or inflammatory changes. Vascular/Lymphatic: Scattered aortic atherosclerosis. Prominent retroperitoneal lymph nodes remain subcentimeter and are not significantly changed (series 2, image 36). Reproductive: Status post hysterectomy. Soft tissue nodule in the vicinity of the right vaginal cuff is not significantly changed, measuring 2.1 x 1.4 cm (series 2, image 68). Other: No abdominal wall hernia or abnormality. No abdominopelvic ascites. Significant interval worsening of omental nodularity and soft tissue caking in the ventral abdomen, which is now essentially confluent and not discretely measurable (series 2, image 52). There is additional nodularity in the right paracolic gutter (series 2, image 46). Musculoskeletal: No acute or significant osseous findings.  IMPRESSION: 1. Significant interval worsening of omental nodularity and soft tissue caking in the ventral abdomen, which is now essentially confluent and not discretely measurable. There is additional nodularity in the right paracolic gutter. Findings are consistent with worsened omental and peritoneal metastatic disease. 2. There is a new, ill-defined hypodense lesion of the inferior right lobe of the liver, hepatic segment VI, measuring 2.3 x 1.5 cm , concerning for hepatic metastatic disease. 3. Status post hysterectomy. Soft tissue nodule in the vicinity of the right vaginal cuff is not significantly changed, measuring 2.1 x 1.4 cm. 4. Prominent retroperitoneal lymph nodes remain subcentimeter and are not significantly changed. Attention on  follow-up. 5. Aortic Atherosclerosis (ICD10-I70.0). Electronically Signed   By: Eddie Candle M.D.   On: 05/04/2020 09:51

## 2020-05-04 NOTE — Telephone Encounter (Signed)
Scheduled appts per 7/21 sch msg. Gave pt a print out of AVS.

## 2020-05-04 NOTE — Assessment & Plan Note (Signed)
She understood goals of care is palliative in nature We discussed the importance of advanced directives and living will We discussed prognosis with or without treatment She has advanced directives and living will She does not want to be resuscitated in the event of terminal illness

## 2020-05-04 NOTE — Assessment & Plan Note (Signed)
I reviewed imaging studies with the patient and family Unfortunately, she has significant signs of cancer progression Overall, she has progressed on carboplatin, paclitaxel, Herceptin, lenvatinib and pembrolizumab We reviewed the current guidelines We discussed treatment options including liposomal doxorubicin, topotecan or docetaxel The risks, benefits, side effects of each option were fully discussed and ultimately she made decision to proceed with liposomal doxorubicin Her treatment option is based on the following publication:  Phase II trial of the pegylated liposomal doxorubicin in previously treated metastatic endometrial cancer: a [employer] study by Camille Bal 1, Henrene Dodge, Gabriela Eves, Danie Binder  PMID: [id number] DOI: 10.1200/JCO.2002.08.171 Abstract Purpose: To determine whether pegylated liposomal doxorubicin (PLD) has antitumor activity in pretreated patients with persistent or recurrent endometrial carcinoma and to define the nature and degree of toxicity of PLD.  Patients and methods: Women with histologically documented recurrent or persistent measurable endometrial carcinoma and with failure of one prior treatment regardless of prior anthracycline therapy were enrolled. PLD was administered intravenously over a 1-hour period at a dose of 50 mg/m(2) every 4 weeks; the dosage was modified in accordance with observed toxicity.  Results: Of 46 patients entered, 42 were assessable for response, as three were declared ineligible on central pathology review and one was not assessable for response. 17 had received prior chemotherapy, 11 hormonal therapy, and 29 radiation therapy. Doxorubicin had been given to 32 patients, carboplatin with paclitaxel to six, carboplatin to one, and fluorouracil to one. Four patients had partial responses lasting 1.1, 2.1, 3.3, and 5.4 months; the overall response rate was 9.5% (95% confidence interval, 2.7% to 22.6%). Three of  these responses (in liver and in lymph node) occurred in patients who had progressed after doxorubicin with either paclitaxel or cisplatin. The median number of courses was 2.5 (range, one to 14). Toxicity was generally mild: only 25 patients experienced leukopenia, with a median WBC count of 2,900 (range, 800 to 3,900) at nadir. The only grade 4 toxicities were one episode each of esophagitis, hematuria, and vomiting. The median overall survival was 8.2 months.  Conclusion: PLD has only limited activity in pretreated advanced, recurrent endometrial cancer, but further trials in anthracycline-naive patients and in previously untreated patients are ongoing. Its toxicity profile should permit its use in combination with myelosuppressive drugs.  Treatment goal is palliative We discussed the risk, benefits, side effects including risk of pancytopenia, infection, congestive heart failure, hand-foot syndrome, nausea with vomiting, mucositis, etc. and she is in agreement to proceed I will obtain baseline echocardiogram before we proceed with therapy I will see her prior to cycle 2 of treatment for assessment of toxicity I recommend minimum 3 cycles before repeating imaging study

## 2020-05-04 NOTE — Assessment & Plan Note (Signed)
Her pain is well controlled with current dose of methadone We discussed narcotic refill policy 

## 2020-05-04 NOTE — Progress Notes (Signed)
DISCONTINUE OFF PATHWAY REGIMEN - Uterine   [id number]:Lenvatinib 20 mg PO Daily D1-21 + Pembrolizumab 200 mg IV D1 q21 Days:   A cycle is every 21 days:     Lenvatinib      Pembrolizumab   **Always confirm dose/schedule in your pharmacy ordering system**  REASON: Disease Progression PRIOR TREATMENT: Off Pathway: Lenvatinib 20 mg PO Daily D1-21 + Pembrolizumab 200 mg IV D1 q21 Days TREATMENT RESPONSE: Progressive Disease (PD)  START OFF PATHWAY REGIMEN - Uterine   [id number]:Liposomal Doxorubicin (Doxil) 40 mg/m2 q28 Days:   A cycle is every 28 days:     Liposomal doxorubicin   **Always confirm dose/schedule in your pharmacy ordering system**  Patient Characteristics: Serous Carcinoma, Recurrent/Progressive Disease, Third Line and Beyond, HER2 Positive Histology: Serous Carcinoma Therapeutic Status: Recurrent or Progressive Disease Line of Therapy: Third Line and Beyond HER2 Status: Positive Intent of Therapy: Non-Curative / Palliative Intent, Discussed with Patient

## 2020-05-04 NOTE — Telephone Encounter (Signed)
Called and left a message with Echo appt for 7/26, arrive at [place] for 9 am appt. Ask her to call the office for questions.

## 2020-05-05 NOTE — Telephone Encounter (Signed)
Called and given Echo appt. She verbalized understanding. She is doing well today. She is complaining of a little soreness only from the fall yesterday. Instructed her to call the office if needed.

## 2020-05-06 NOTE — Progress Notes (Signed)
Pharmacist Chemotherapy Monitoring - Initial Assessment    Anticipated start date: 05/12/20  Regimen:   Are orders appropriate based on the patients diagnosis, regimen, and cycle? Yes  Does the plan date match the patients scheduled date? Yes  Is the sequencing of drugs appropriate? Yes  Are the premedications appropriate for the patients regimen? Yes  Prior Authorization for treatment is: Approved o If applicable, is the correct biosimilar selected based on the patient's insurance? not applicable  Organ Function and Labs:  Are dose adjustments needed based on the patient's renal function, hepatic function, or hematologic function? No  Are appropriate labs ordered prior to the start of patient's treatment? Yes  Other organ system assessment, if indicated: anthracyclines: Echo/ MUGA  The following baseline labs, if indicated, have been ordered: N/A  Dose Assessment:  Are the drug doses appropriate? Yes  Are the following correct: o Drug concentrations Yes o IV fluid compatible with drug Yes o Administration routes Yes o Timing of therapy Yes  If applicable, does the patient have documented access for treatment and/or plans for port-a-cath placement? yes  If applicable, have lifetime cumulative doses been properly documented and assessed? yes Lifetime Dose Tracking   Carboplatin: 8,750 mg = 0.01 % of the maximum lifetime dose of 999,999,999 mg  o   Toxicity Monitoring/Prevention:  The patient has the following take home antiemetics prescribed: Ondansetron and Prochlorperazine  The patient has the following take home medications prescribed: N/A  Medication allergies and previous infusion related reactions, if applicable, have been reviewed and addressed. Yes  The patient's current medication list has been assessed for drug-drug interactions with their chemotherapy regimen. no significant drug-drug interactions were identified on review.  Order Review:  Are the  treatment plan orders signed? Yes  Is the patient scheduled to see a provider prior to their treatment? No  I verify that I have reviewed each item in the above checklist and answered each question accordingly.   Kennith Center, Pharm.D., CPP 05/06/2020@12 :20 PM

## 2020-05-09 ENCOUNTER — Ambulatory Visit (HOSPITAL_COMMUNITY)
Admission: RE | Admit: 2020-05-09 | Discharge: 2020-05-09 | Disposition: A | Discharge status: Home / Self Care | Payer: Medicare Other | Source: Ambulatory Visit | Attending: Hematology and Oncology | Admitting: Hematology and Oncology

## 2020-05-09 ENCOUNTER — Other Ambulatory Visit: Payer: Self-pay

## 2020-05-09 ENCOUNTER — Telehealth: Payer: Self-pay

## 2020-05-09 DIAGNOSIS — Z5111 Encounter for antineoplastic chemotherapy: Secondary | ICD-10-CM

## 2020-05-09 DIAGNOSIS — I10 Essential (primary) hypertension: Secondary | ICD-10-CM | POA: Diagnosis not present

## 2020-05-09 DIAGNOSIS — C541 Malignant neoplasm of endometrium: Secondary | ICD-10-CM | POA: Insufficient documentation

## 2020-05-09 LAB — ECHOCARDIOGRAM COMPLETE
Area-P 1/2: 2.99 cm2
Calc EF: 71.4 %
S' Lateral: 3.1 cm
Single Plane A2C EF: 64.9 %
Single Plane A4C EF: 77.6 %

## 2020-05-09 NOTE — Progress Notes (Signed)
  Echocardiogram 2D Echocardiogram has been performed.  Kathryn [name] 05/09/2020, 9:52 AM

## 2020-05-09 NOTE — Telephone Encounter (Signed)
Called per Dr. Alvy Bimler and told her Echo is good. She verbalized understanding.

## 2020-05-10 ENCOUNTER — Telehealth: Payer: Self-pay | Admitting: Oncology

## 2020-05-10 NOTE — Telephone Encounter (Signed)
Kathryn [name] called and said she is having nausea that started last night.  She ate a fresh peach and started burping during the night.  She said she has tried to throw up but is only able to bring up phlegm.  She is not running a fever but does have chills every once in a while.  Advised her to take either Zofran or Compazine and that I will check on her this afternoon.

## 2020-05-11 ENCOUNTER — Other Ambulatory Visit: Payer: Self-pay | Admitting: Oncology

## 2020-05-11 ENCOUNTER — Telehealth: Payer: Self-pay

## 2020-05-11 MED ORDER — METHADONE HCL 10 MG PO TABS: 20.0000 mg | ORAL_TABLET | Freq: Three times a day (TID) | ORAL | 0 refills | Status: DC

## 2020-05-11 NOTE — Telephone Encounter (Signed)
Called Kathryn [name] to see how she is feeling.  She said she is feeling better today.  She did take Zofran which helped.

## 2020-05-11 NOTE — Telephone Encounter (Signed)
Called pt to inform her that Dr Alen Blew refilled Methadone per her request and was sent to [facility] in [place]. Pt verbalized thanks and understanding.

## 2020-05-12 ENCOUNTER — Inpatient Hospital Stay: Payer: Medicare Other

## 2020-05-12 ENCOUNTER — Other Ambulatory Visit: Payer: Self-pay

## 2020-05-12 VITALS — BP 117/63 | HR 61 | Temp 97.8°F | Resp 18 | Wt 280.0 lb

## 2020-05-12 DIAGNOSIS — I10 Essential (primary) hypertension: Secondary | ICD-10-CM | POA: Diagnosis not present

## 2020-05-12 DIAGNOSIS — K5909 Other constipation: Secondary | ICD-10-CM | POA: Diagnosis not present

## 2020-05-12 DIAGNOSIS — E876 Hypokalemia: Secondary | ICD-10-CM

## 2020-05-12 DIAGNOSIS — G893 Neoplasm related pain (acute) (chronic): Secondary | ICD-10-CM | POA: Diagnosis not present

## 2020-05-12 DIAGNOSIS — Z7189 Other specified counseling: Secondary | ICD-10-CM

## 2020-05-12 DIAGNOSIS — C541 Malignant neoplasm of endometrium: Secondary | ICD-10-CM

## 2020-05-12 DIAGNOSIS — R001 Bradycardia, unspecified: Secondary | ICD-10-CM | POA: Diagnosis not present

## 2020-05-12 DIAGNOSIS — Z5112 Encounter for antineoplastic immunotherapy: Secondary | ICD-10-CM | POA: Diagnosis not present

## 2020-05-12 LAB — CBC WITH DIFFERENTIAL (CANCER CENTER ONLY)
Abs Immature Granulocytes: 0 10*3/uL (ref 0.00–0.07)
Basophils Absolute: 0 10*3/uL (ref 0.0–0.1)
Basophils Relative: 0 %
Eosinophils Absolute: 0 10*3/uL (ref 0.0–0.5)
Eosinophils Relative: 0 %
HCT: 34.5 % — ABNORMAL LOW (ref 36.0–46.0)
Hemoglobin: 11 g/dL — ABNORMAL LOW (ref 12.0–15.0)
Immature Granulocytes: 0 %
Lymphocytes Relative: 25 %
Lymphs Abs: 0.6 10*3/uL — ABNORMAL LOW (ref 0.7–4.0)
MCH: 33.5 pg (ref 26.0–34.0)
MCHC: 31.9 g/dL (ref 30.0–36.0)
MCV: 105.2 fL — ABNORMAL HIGH (ref 80.0–100.0)
Monocytes Absolute: 0.1 10*3/uL (ref 0.1–1.0)
Monocytes Relative: 6 %
Neutro Abs: 1.6 10*3/uL — ABNORMAL LOW (ref 1.7–7.7)
Neutrophils Relative %: 69 %
Platelet Count: 147 10*3/uL — ABNORMAL LOW (ref 150–400)
RBC: 3.28 MIL/uL — ABNORMAL LOW (ref 3.87–5.11)
RDW: 13.5 % (ref 11.5–15.5)
WBC Count: 2.4 10*3/uL — ABNORMAL LOW (ref 4.0–10.5)
nRBC: 0 % (ref 0.0–0.2)

## 2020-05-12 LAB — CMP (CANCER CENTER ONLY)
ALT: 33 U/L (ref 0–44)
AST: 37 U/L (ref 15–41)
Albumin: 3.5 g/dL (ref 3.5–5.0)
Alkaline Phosphatase: 168 U/L — ABNORMAL HIGH (ref 38–126)
Anion gap: 10 (ref 5–15)
BUN: 10 mg/dL (ref 8–23)
CO2: 27 mmol/L (ref 22–32)
Calcium: 10.1 mg/dL (ref 8.9–10.3)
Chloride: 102 mmol/L (ref 98–111)
Creatinine: 0.77 mg/dL (ref 0.44–1.00)
GFR, Est AFR Am: >60 mL/min (ref >60)
GFR, Estimated: >60 mL/min (ref >60)
Glucose, Bld: 165 mg/dL — ABNORMAL HIGH (ref 70–99)
Potassium: 4 mmol/L (ref 3.5–5.1)
Sodium: 139 mmol/L (ref 135–145)
Total Bilirubin: 0.4 mg/dL (ref 0.3–1.2)
Total Protein: 7.1 g/dL (ref 6.5–8.1)

## 2020-05-12 LAB — MAGNESIUM: Magnesium: 1.9 mg/dL (ref 1.7–2.4)

## 2020-05-12 MED ORDER — DOXORUBICIN HCL LIPOSOMAL CHEMO INJECTION 2 MG/ML
40.0000 mg/m2 | Freq: Once | INTRAVENOUS | Status: AC
  Administered 2020-05-12: 94 mg via INTRAVENOUS
  Filled 2020-05-12: qty 47

## 2020-05-12 MED ORDER — HEPARIN SOD (PORK) LOCK FLUSH 100 UNIT/ML IV SOLN
500.0000 [IU] | Freq: Once | INTRAVENOUS | Status: AC | PRN
  Administered 2020-05-12: 500 [IU]
  Filled 2020-05-12: qty 5

## 2020-05-12 MED ORDER — DEXTROSE 5 % IV SOLN
Freq: Once | INTRAVENOUS | Status: AC
  Filled 2020-05-12: qty 250

## 2020-05-12 MED ORDER — SODIUM CHLORIDE 0.9 % IV SOLN
10.0000 mg | Freq: Once | INTRAVENOUS | Status: AC
  Administered 2020-05-12: 10 mg via INTRAVENOUS
  Filled 2020-05-12: qty 10

## 2020-05-12 MED ORDER — SODIUM CHLORIDE 0.9% FLUSH
10.0000 mL | Freq: Once | INTRAVENOUS | Status: AC
  Administered 2020-05-12: 10 mL
  Filled 2020-05-12: qty 10

## 2020-05-12 MED ORDER — SODIUM CHLORIDE 0.9% FLUSH
10.0000 mL | INTRAVENOUS | Status: DC | PRN
  Administered 2020-05-12: 10 mL
  Filled 2020-05-12: qty 10

## 2020-05-12 NOTE — Patient Instructions (Signed)
[facility] Discharge Instructions for Patients Receiving Chemotherapy  Today you received the following chemotherapy agents: doxorubicin liposomal.  To help prevent nausea and vomiting after your treatment, we encourage you to take your nausea medication as prescribed.   If you develop nausea and vomiting that is not controlled by your nausea medication, call the clinic.   BELOW ARE SYMPTOMS THAT SHOULD BE REPORTED IMMEDIATELY:  *FEVER GREATER THAN 100.5 F  *CHILLS WITH OR WITHOUT FEVER  NAUSEA AND VOMITING THAT IS NOT CONTROLLED WITH YOUR NAUSEA MEDICATION  *UNUSUAL SHORTNESS OF BREATH  *UNUSUAL BRUISING OR BLEEDING  TENDERNESS IN MOUTH AND THROAT WITH OR WITHOUT PRESENCE OF ULCERS  *URINARY PROBLEMS  *BOWEL PROBLEMS  UNUSUAL RASH Items with * indicate a potential emergency and should be followed up as soon as possible.  Feel free to call the clinic should you have any questions or concerns. The clinic phone number is [phone].  Please show the [facility] at check-in to the Emergency Department and triage nurse.  Doxorubicin Liposomal injection What is this medicine? LIPOSOMAL DOXORUBICIN (LIP oh som al dox oh ROO bi sin) is a chemotherapy drug. This medicine is used to treat many kinds of cancer like Kaposi's sarcoma, multiple myeloma, and ovarian cancer. This medicine may be used for other purposes; ask your health care provider or pharmacist if you have questions. COMMON BRAND NAME(S): Doxil, Lipodox What should I tell my health care provider before I take this medicine? They need to know if you have any of these conditions:  blood disorders  heart disease  infection (especially a virus infection such as chickenpox, cold sores, or herpes)  liver disease  recent or ongoing radiation therapy  an unusual or allergic reaction to doxorubicin, other chemotherapy agents, soybeans, other medicines, foods, dyes, or preservatives  pregnant or  trying to get pregnant  breast-feeding How should I use this medicine? This drug is given as an infusion into a vein. It is administered in a hospital or clinic by a specially trained health care professional. If you have pain, swelling, burning or any unusual feeling around the site of your injection, tell your health care professional right away. Talk to your pediatrician regarding the use of this medicine in children. Special care may be needed. Overdosage: If you think you have taken too much of this medicine contact a poison control center or emergency room at once. NOTE: This medicine is only for you. Do not share this medicine with others. What if I miss a dose? It is important not to miss your dose. Call your doctor or health care professional if you are unable to keep an appointment. What may interact with this medicine? Do not take this medicine with any of the following medications:  zidovudine This medicine may also interact with the following medications:  medicines to increase blood counts like filgrastim, pegfilgrastim, sargramostim  vaccines Talk to your doctor or health care professional before taking any of these medicines:  acetaminophen  aspirin  ibuprofen  ketoprofen  naproxen This list may not describe all possible interactions. Give your health care provider a list of all the medicines, herbs, non-prescription drugs, or dietary supplements you use. Also tell them if you smoke, drink alcohol, or use illegal drugs. Some items may interact with your medicine. What should I watch for while using this medicine? Your condition will be monitored carefully while you are receiving this medicine. You may need blood work done while you are taking this  taking this medicine. This drug may make you feel generally unwell. This is not uncommon, as chemotherapy can affect healthy cells as well as cancer cells. Report any side effects. Continue your course of treatment even though you feel  ill unless your doctor tells you to stop. Your urine may turn orange-red for a few days after your dose. This is not blood. If your urine is dark or brown, call your doctor. In some cases, you may be given additional medicines to help with side effects. Follow all directions for their use. Talk to your doctor about your risk of cancer. You may be more at risk for certain types of cancers if you take this medicine. Do not become pregnant while taking this medicine or for 6 months after stopping it. Women should inform their healthcare professional if they wish to become pregnant or think they may be pregnant. Men should not father a child while taking this medicine and for 6 months after stopping it. There is a potential for serious side effects to an unborn child. Talk to your health care professional or pharmacist for more information. Do not breast-feed an infant while taking this medicine. This medicine has caused ovarian failure in some women. This medicine may make it more difficult to get pregnant. Talk to your healthcare professional if you are concerned about your fertility. This medicine has caused decreased sperm counts in some men. This may make it more difficult to father a child. Talk to your healthcare professional if you are concerned about your fertility. This medicine may cause a decrease in Co-Enzyme Q-10. You should make sure that you get enough Co-Enzyme Q-10 while you are taking this medicine. Discuss the foods you eat and the vitamins you take with your health care professional. What side effects may I notice from receiving this medicine? Side effects that you should report to your doctor or health care professional as soon as possible:  allergic reactions like skin rash, itching or hives, swelling of the face, lips, or tongue  low blood counts - this medicine may decrease the number of white blood cells, red blood cells and platelets. You may be at increased risk for infections  and bleeding.  signs of hand-foot syndrome - tingling or burning, redness, flaking, swelling, small blisters, or small sores on the palms of your hands or the soles of your feet  signs of infection - fever or chills, cough, sore throat, pain or difficulty passing urine  signs of decreased platelets or bleeding - bruising, pinpoint red spots on the skin, black, tarry stools, blood in the urine  signs of decreased red blood cells - unusually weak or tired, fainting spells, lightheadedness  back pain, chills, facial flushing, fever, headache, tightness in the chest or throat during the infusion  breathing problems  chest pain  fast, irregular heartbeat  mouth pain, redness, sores  pain, swelling, redness at site where injected  pain, tingling, numbness in the hands or feet  swelling of ankles, feet, or hands  vomiting Side effects that usually do not require medical attention (report to your doctor or health care professional if they continue or are bothersome):  diarrhea  hair loss  loss of appetite  nail discoloration or damage  nausea  red or watery eyes  red colored urine  stomach upset This list may not describe all possible side effects. Call your doctor for medical advice about side effects. You may report side effects to FDA at 1-800-FDA-1088. Where should I keep   This drug is given in a hospital or clinic and will not be stored at home. NOTE: This sheet is a summary. It may not cover all possible information. If you have questions about this medicine, talk to your doctor, pharmacist, or health care provider.  2020 Elsevier/Gold Standard (2018-06-09 15:13:26)

## 2020-05-13 ENCOUNTER — Telehealth: Payer: Self-pay | Admitting: *Deleted

## 2020-05-13 NOTE — Telephone Encounter (Signed)
-----   Message from Teodoro Spray, RN sent at 05/12/2020  4:19 PM EDT ----- Regarding: Kathryn [name] first time chemo follow-up Dr. Alvy [name] patient first time doxorubicin liposomal on 05/12/20. Patient tolerated treatment well. Thank you.

## 2020-05-13 NOTE — Telephone Encounter (Signed)
Called & left message for return call to discuss how she did with her treatment yesterday.

## 2020-05-23 ENCOUNTER — Telehealth: Payer: Self-pay | Admitting: Oncology

## 2020-05-23 NOTE — Telephone Encounter (Signed)
Increase miralax to BID Get OTC sennokot take 2 pills three times a day Advise her to call if no BM in 2 days

## 2020-05-23 NOTE — Telephone Encounter (Signed)
Called Kathlee Nations and advised her of message below from Dr. Alvy Bimler.  She verbalized understanding and agreement.

## 2020-05-23 NOTE — Telephone Encounter (Signed)
Zyana called and said she has been having constipation since her last chemotherapy on 05/12/20.  She is taking Miralax once a day and docusate 2 tablets once a day.   She did not have a bowel movement until 8/5 when she had to manually disimpact herself. She did not have a bowel movement on 8/6 and used a fleet glycerine suppository.  She did have a bowel movement about the size of her palm on 8/7.  She is wondering if there is anything else she can take for the constipation.    She also mentioned that she has started taking famotidine 10 mg daily for reflux.

## 2020-06-02 ENCOUNTER — Other Ambulatory Visit: Payer: Self-pay | Admitting: Hematology and Oncology

## 2020-06-02 ENCOUNTER — Telehealth: Payer: Self-pay | Admitting: Oncology

## 2020-06-02 MED ORDER — METHADONE HCL 10 MG PO TABS: 20.0000 mg | ORAL_TABLET | Freq: Three times a day (TID) | ORAL | 0 refills | Status: DC

## 2020-06-02 NOTE — Telephone Encounter (Signed)
done

## 2020-06-02 NOTE — Telephone Encounter (Signed)
Kathryn [name] called and asked for a refill of Methadone.  She has 4 tablets left and would like it sent to [facility].

## 2020-06-07 ENCOUNTER — Telehealth: Payer: Self-pay | Admitting: Oncology

## 2020-06-07 NOTE — Telephone Encounter (Signed)
Kathryn [name] called and asked if she is supposed to take dexamethasone before chemotherapy on Friday.

## 2020-06-07 NOTE — Telephone Encounter (Signed)
No need steroids before or after treatment Please remove from med profile

## 2020-06-10 ENCOUNTER — Other Ambulatory Visit: Payer: Self-pay

## 2020-06-10 ENCOUNTER — Inpatient Hospital Stay: Payer: Medicare Other

## 2020-06-10 ENCOUNTER — Inpatient Hospital Stay: Payer: Medicare Other | Admitting: Hematology and Oncology

## 2020-06-10 ENCOUNTER — Inpatient Hospital Stay: Payer: Medicare Other | Attending: Hematology and Oncology

## 2020-06-10 DIAGNOSIS — E876 Hypokalemia: Secondary | ICD-10-CM

## 2020-06-10 DIAGNOSIS — R110 Nausea: Secondary | ICD-10-CM | POA: Diagnosis not present

## 2020-06-10 DIAGNOSIS — D61818 Other pancytopenia: Secondary | ICD-10-CM | POA: Diagnosis not present

## 2020-06-10 DIAGNOSIS — G893 Neoplasm related pain (acute) (chronic): Secondary | ICD-10-CM | POA: Diagnosis not present

## 2020-06-10 DIAGNOSIS — C541 Malignant neoplasm of endometrium: Secondary | ICD-10-CM

## 2020-06-10 DIAGNOSIS — Z7189 Other specified counseling: Secondary | ICD-10-CM

## 2020-06-10 DIAGNOSIS — R1011 Right upper quadrant pain: Secondary | ICD-10-CM | POA: Insufficient documentation

## 2020-06-10 DIAGNOSIS — I10 Essential (primary) hypertension: Secondary | ICD-10-CM | POA: Diagnosis not present

## 2020-06-10 DIAGNOSIS — K5909 Other constipation: Secondary | ICD-10-CM

## 2020-06-10 DIAGNOSIS — Z5111 Encounter for antineoplastic chemotherapy: Secondary | ICD-10-CM | POA: Diagnosis not present

## 2020-06-10 DIAGNOSIS — T451X5A Adverse effect of antineoplastic and immunosuppressive drugs, initial encounter: Secondary | ICD-10-CM | POA: Insufficient documentation

## 2020-06-10 DIAGNOSIS — Z23 Encounter for immunization: Secondary | ICD-10-CM

## 2020-06-10 LAB — CMP (CANCER CENTER ONLY)
ALT: 7 U/L (ref 0–44)
AST: 15 U/L (ref 15–41)
Albumin: 3.1 g/dL — ABNORMAL LOW (ref 3.5–5.0)
Alkaline Phosphatase: 103 U/L (ref 38–126)
Anion gap: 8 (ref 5–15)
BUN: 7 mg/dL — ABNORMAL LOW (ref 8–23)
CO2: 31 mmol/L (ref 22–32)
Calcium: 9.3 mg/dL (ref 8.9–10.3)
Chloride: 101 mmol/L (ref 98–111)
Creatinine: 0.7 mg/dL (ref 0.44–1.00)
GFR, Est AFR Am: >60 mL/min (ref >60)
GFR, Estimated: >60 mL/min (ref >60)
Glucose, Bld: 127 mg/dL — ABNORMAL HIGH (ref 70–99)
Potassium: 3.6 mmol/L (ref 3.5–5.1)
Sodium: 140 mmol/L (ref 135–145)
Total Bilirubin: 0.4 mg/dL (ref 0.3–1.2)
Total Protein: 6.5 g/dL (ref 6.5–8.1)

## 2020-06-10 LAB — CBC WITH DIFFERENTIAL (CANCER CENTER ONLY)
Abs Immature Granulocytes: 0.01 10*3/uL (ref 0.00–0.07)
Basophils Absolute: 0 10*3/uL (ref 0.0–0.1)
Basophils Relative: 0 %
Eosinophils Absolute: 0 10*3/uL (ref 0.0–0.5)
Eosinophils Relative: 0 %
HCT: 32.4 % — ABNORMAL LOW (ref 36.0–46.0)
Hemoglobin: 10.4 g/dL — ABNORMAL LOW (ref 12.0–15.0)
Immature Granulocytes: 0 %
Lymphocytes Relative: 23 %
Lymphs Abs: 0.7 10*3/uL (ref 0.7–4.0)
MCH: 32.3 pg (ref 26.0–34.0)
MCHC: 32.1 g/dL (ref 30.0–36.0)
MCV: 100.6 fL — ABNORMAL HIGH (ref 80.0–100.0)
Monocytes Absolute: 0.7 10*3/uL (ref 0.1–1.0)
Monocytes Relative: 24 %
Neutro Abs: 1.6 10*3/uL — ABNORMAL LOW (ref 1.7–7.7)
Neutrophils Relative %: 53 %
Platelet Count: 214 10*3/uL (ref 150–400)
RBC: 3.22 MIL/uL — ABNORMAL LOW (ref 3.87–5.11)
RDW: 14.7 % (ref 11.5–15.5)
WBC Count: 3.1 10*3/uL — ABNORMAL LOW (ref 4.0–10.5)
nRBC: 0 % (ref 0.0–0.2)

## 2020-06-10 LAB — MAGNESIUM: Magnesium: 1.7 mg/dL (ref 1.7–2.4)

## 2020-06-10 MED ORDER — SODIUM CHLORIDE 0.9% FLUSH
10.0000 mL | Freq: Once | INTRAVENOUS | Status: AC
  Administered 2020-06-10: 10 mL
  Filled 2020-06-10: qty 10

## 2020-06-10 MED ORDER — SODIUM CHLORIDE 0.9 % IV SOLN
10.0000 mg | Freq: Once | INTRAVENOUS | Status: AC
  Administered 2020-06-10: 10 mg via INTRAVENOUS
  Filled 2020-06-10: qty 10

## 2020-06-10 MED ORDER — HEPARIN SOD (PORK) LOCK FLUSH 100 UNIT/ML IV SOLN
500.0000 [IU] | Freq: Once | INTRAVENOUS | Status: AC | PRN
  Administered 2020-06-10: 500 [IU]
  Filled 2020-06-10: qty 5

## 2020-06-10 MED ORDER — DOXORUBICIN HCL LIPOSOMAL CHEMO INJECTION 2 MG/ML
40.0000 mg/m2 | Freq: Once | INTRAVENOUS | Status: AC
  Administered 2020-06-10: 94 mg via INTRAVENOUS
  Filled 2020-06-10: qty 47

## 2020-06-10 MED ORDER — SODIUM CHLORIDE 0.9% FLUSH
10.0000 mL | INTRAVENOUS | Status: DC | PRN
  Administered 2020-06-10: 10 mL
  Filled 2020-06-10: qty 10

## 2020-06-10 MED ORDER — DEXTROSE 5 % IV SOLN
Freq: Once | INTRAVENOUS | Status: AC
  Filled 2020-06-10: qty 250

## 2020-06-10 NOTE — Progress Notes (Signed)
   Covid-19 Vaccination Clinic  Name:  Kathryn [name]    MRN: [id number] DOB: May 04, 1951  06/10/2020  [name] was observed post Covid-19 immunization for 15 minutes without incident during her 30 minute wait prior to chemothearpy. She was provided with Vaccine Information Sheet and instruction to access the V-Safe system.   [name] was instructed to call 911 with any severe reactions post vaccine: Marland Kitchen Difficulty breathing  . Swelling of face and throat  . A fast heartbeat  . A bad rash all over body  . Dizziness and weakness

## 2020-06-10 NOTE — Patient Instructions (Signed)
[facility] Discharge Instructions for Patients Receiving Chemotherapy  Today you received the following chemotherapy agents: liposomal doxorubicin (DOXIL).  To help prevent nausea and vomiting after your treatment, we encourage you to take your nausea medication as directed.   If you develop nausea and vomiting that is not controlled by your nausea medication, call the clinic.   BELOW ARE SYMPTOMS THAT SHOULD BE REPORTED IMMEDIATELY:  *FEVER GREATER THAN 100.5 F  *CHILLS WITH OR WITHOUT FEVER  NAUSEA AND VOMITING THAT IS NOT CONTROLLED WITH YOUR NAUSEA MEDICATION  *UNUSUAL SHORTNESS OF BREATH  *UNUSUAL BRUISING OR BLEEDING  TENDERNESS IN MOUTH AND THROAT WITH OR WITHOUT PRESENCE OF ULCERS  *URINARY PROBLEMS  *BOWEL PROBLEMS  UNUSUAL RASH Items with * indicate a potential emergency and should be followed up as soon as possible.  Feel free to call the clinic should you have any questions or concerns. The clinic phone number is [phone].  Please show the [facility] at check-in to the Emergency Department and triage nurse.

## 2020-06-11 ENCOUNTER — Encounter: Payer: Self-pay | Admitting: Hematology and Oncology

## 2020-06-11 NOTE — Assessment & Plan Note (Signed)
This is likely due to recent side effects of treatment She is not symptomatic Observe only for now

## 2020-06-11 NOTE — Assessment & Plan Note (Signed)
Her pain is well controlled with current dose of methadone We discussed narcotic refill policy

## 2020-06-11 NOTE — Assessment & Plan Note (Addendum)
She tolerated treatment well except for severe constipation, nausea, severe pancytopenia and intermittent RUQ pain We will proceed with treatment as scheduled along with aggressive supportive care

## 2020-06-11 NOTE — Assessment & Plan Note (Signed)
We have extensive discussions about laxative therapy 

## 2020-06-11 NOTE — Assessment & Plan Note (Signed)
We have extensive discussions about management of nausea I think it could be due to chemo side effects and recent constipation

## 2020-06-11 NOTE — Progress Notes (Signed)
[facility] OFFICE PROGRESS NOTE  Patient Care Team: Ronita Hipps, MD as PCP - General (Family Medicine) Nicholaus Bloom, MD (Anesthesiology)  ASSESSMENT & PLAN:  Endometrial cancer [facility]) She tolerated treatment well except for severe constipation, nausea, severe pancytopenia and intermittent RUQ pain We will proceed with treatment as scheduled along with aggressive supportive care  Pancytopenia, acquired [facility]) This is likely due to recent side effects of treatment She is not symptomatic Observe only for now  Other constipation We have extensive discussions about laxative therapy  Chemotherapy-induced nausea We have extensive discussions about management of nausea I think it could be due to chemo side effects and recent constipation  Cancer associated pain Her pain is well controlled with current dose of methadone We discussed narcotic refill policy   No orders of the defined types were placed in this encounter.   All questions were answered. The patient knows to call the clinic with any problems, questions or concerns. The total time spent in the appointment was 30 minutes encounter with patients including review of chart and various tests results, discussions about plan of care and coordination of care plan   Heath Lark, MD 06/11/2020 1:46 PM  INTERVAL HISTORY: Please see below for problem oriented charting. She returns for follow-up She has intermittent RUQ pain. She has recent severe constipation x 7 days, resolves after further aggressive laxatives and digital disimpaction by family member She has nausea but no vomiting No mucositis, fever or chills The patient denies any recent signs or symptoms of bleeding such as spontaneous epistaxis, hematuria or hematochezia.  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 A Pap was done 09/02/18 showign ASC-H. She was referred onto GYN and seen by Dr. Paula Compton    09/09/2018 Procedure   She underwent endometrial biopsy   09/12/2018 Pathology Results   Endometrial biopsy positive for adenocarcinoma   09/22/2018 Imaging   Ct abdomen and pelvis showed: Diffuse endometrial thickening, consistent with primary endometrial carcinoma.  Pelvic and abdominal retroperitoneal and retrocrural lymphadenopathy, consistent with metastatic disease.  Moderate hepatic steatosis.   09/25/2018 Cancer Staging   Staging form: Corpus Uteri - Carcinoma and Carcinosarcoma, AJCC 8th Edition - Pathologic: Stage IIIC2 (pT2, pN2, cM0) - Signed by Heath Lark, MD on 02/23/2019   10/01/2018 Procedure   Successful placement of a right IJ approach Power Port with ultrasound and fluoroscopic guidance. The catheter is ready for use.   10/02/2018 Tumor Marker   Patient's tumor was tested for the following markers: CA-125 Results of the tumor marker test revealed 578   10/03/2018 - 01/16/2019 Chemotherapy   The patient had carboplatin and Taxol with dose adjustment due to neuropathy x 6 cycles   10/24/2018 Tumor Marker   Patient's tumor was tested for the following markers: CA-125 Results of the tumor marker test revealed 296    Genetic Testing   Patient has genetic testing done for MSI/MMR. Results revealed MSI stable and MMR normal on Accession [id number].   11/14/2018 Tumor Marker   Patient's tumor was tested for the following markers: CA-125 Results of the tumor marker test revealed 81   11/27/2018 Imaging   1. Significant decrease in abdominal retroperitoneal and bilateral iliac lymphadenopathy  since previous study. 2. Significant decrease in abnormal endometrial soft tissue density since prior  study. 3. No new or progressive metastatic disease identified.   12/05/2018 Tumor Marker   Patient's tumor was tested for the following markers: CA-125 Results of the tumor marker test revealed 46.3   12/26/2018 Tumor Marker   Patient's tumor was tested for the following markers: CA-125 Results of the tumor marker test revealed 38.2   01/16/2019 Tumor Marker   Patient's tumor was tested for the following markers: CA-125 Results of the tumor marker test revealed 30.8   02/03/2019 Imaging   1. Numerous retroperitoneal, iliac, and pelvic lymph nodes are stable or slightly decreased in size compared to immediate prior examination dated 11/27/2018 and significantly decreased in size compared to exam dated 09/20/2018.  2.  Unchanged thickening of the endometrium.  3. Other chronic, incidental, and postoperative findings as detailed above.   02/12/2019 Imaging   1. Uterus +/- tubes/ovaries, neoplastic, cervix, bilateral fallopian tubes and ovaries - MIXED HIGH-GRADE SEROUS AND ENDOMETRIAL ADENOCARCINOMA, 6.5 CM. SEE NOTE - CARCINOMA INVOLVES ENTIRE ENDOMETRIUM, ANTERIOR AND POSTERIOR LOWER UTERINE SEGMENTS AND ANTERIOR AND POSTERIOR CERVIX. - CARCINOMA INVADES FOR A DEPTH OF 1.9 CM WHERE MYOMETRIAL THICKNESS IS 2.0 CM (GREATER THAN 50% MYOMETRIAL INVASION) - CARCINOMA IS 1 MM FROM THE CERVICAL MARGIN - NEGATIVE FOR LYMPHOVASCULAR OR PERINEURAL INVASION - BILATERAL UNREMARKABLE FALLOPIAN TUBES AND OVARIES, NEGATIVE FOR CARCINOMA - SEE ONCOLOGY TABLE 2. Lymph node, biopsy, right pelvic - METASTATIC CARCINOMA TO ONE OF TWO LYMPH NODES (1/2) Microscopic Comment 1. (v4.1.0.0) UTERUS, CARCINOMA OR CARCINOSARCOMA Procedure: Total hysterectomy with bilateral salpingo-oophorectomy Histologic type: Mixed high-grade serous and endometrioid adenocarcinoma Histologic Grade: NA Myometrial invasion: Present Depth of invasion: 19 mm Myometrial thickness: 20 mm Uterine Serosa Involvement: Not  identified Cervical stromal involvement: Present Extent of involvement of other organs: NA Lymphovascular invasion: Not identified Regional Lymph Nodes: Examined: 0 Sentinel 2 Non-sentinel 2 Total Lymph nodes with metastasis: 1 Isolated tumor cells (< 0.2 mm): 0 Micrometastasis: (> 0.2 mm and < 2.0 mm): 1 Macrometastasis: (> 2.0 mm): 0 Extracapsular extension: Not identified Tumor block for ancillary studies: 66F MMR / MSI testing: Pending Pathologic Stage Classification (pTNM, AJCC 8th edition): pT2, pN70m FIGO Stage: IIIC1 Representative Tumor Block: 1A, 1C (v4.1.0.0) Diagnosis Note 1. Immunohistochemical stains for p16, p53, ER and Ki-67 show a pattern of staining consistent with mixed high-grade serous and endometrial adenocarcinoma. Dr. KLyndon Codehas reviewed this case and concurs with the above interpretation. A molecular study for microsatellite instability (MSI) and immunostains for MMR-related proteins are pending and will be reported in an addendum.   02/12/2019 Surgery   Pre-operative Diagnosis: endometrial cancer stage IIIC2, s/p neoadjuvant chemotherapy, extreme obesity (BMI 53kg/m2)  Operation: Robotic-assisted laparoscopic total hysterectomy with bilateral salpingoophorectomy, right pelvic lymphadenectomy. 22 modifier for extreme obesity  Surgeon: RDonaciano Eva Assistant Surgeon: LLahoma CrockerMD  Operative Findings:  : extreme obesity, BMI 53kg/m2, with significant retroperitoneal and intraperitoneal adiposity. Obesity necessitated an additional hour of operating time to create safe exposure. Obesity required additional personnel in the operating room for positioning and retraction. Severe obesity substantially increased the complexity of the procedure. 8cm uterus, grossly normal, normal appearing cervix. Retroperitoneal fibrosis consistent with nodal positivity. Clinically suspicious right pelvic lymph node. Unable to completely visualize retroperitoneal nodes  due to extreme obesity.    02/12/2019 Pathology Results   IMMUNOHISTOCHEMICAL AND MORPHOMETRIC ANALYSIS PERFORMED MANUALLY The tumor cells are POSITIVE for Her2 (3+). Estrogen Receptor: 70%, POSITIVE, STRONG  STAINING INTENSITY Progesterone Receptor: 70%, POSITIVE, STRONG STAINING INTENSITY Proliferation Marker Ki67: 20% REFERENCE RANGE ESTROGEN RECEPTOR NEGATIVE 0% POSITIVE =>1% REFERENCE RANGE PROGESTERONE RECEPTOR NEGATIVE 0% POSITIVE =>1% All controls stained appropriately  1. Uterus +/- tubes/ovaries, neoplastic, cervix, bilateral fallopian tubes and ovaries - MIXED HIGH-GRADE SEROUS AND ENDOMETRIAL ADENOCARCINOMA, 6.5 CM. SEE NOTE - CARCINOMA INVOLVES ENTIRE ENDOMETRIUM, ANTERIOR AND POSTERIOR LOWER UTERINE SEGMENTS AND ANTERIOR AND POSTERIOR CERVIX. - CARCINOMA INVADES FOR A DEPTH OF 1.9 CM WHERE MYOMETRIAL THICKNESS IS 2.0 CM (GREATER THAN 50% MYOMETRIAL INVASION) - CARCINOMA IS 1 MM FROM THE CERVICAL MARGIN - NEGATIVE FOR LYMPHOVASCULAR OR PERINEURAL INVASION - BILATERAL UNREMARKABLE FALLOPIAN TUBES AND OVARIES, NEGATIVE FOR CARCINOMA - SEE ONCOLOGY TABLE 2. Lymph node, biopsy, right pelvic - METASTATIC CARCINOMA TO ONE OF TWO LYMPH NODES (1/2) Microscopic Comment 1. (v4.1.0.0) UTERUS, CARCINOMA OR CARCINOSARCOMA Procedure: Total hysterectomy with bilateral salpingo-oophorectomy Histologic type: Mixed high-grade serous and endometrioid adenocarcinoma Histologic Grade: NA Myometrial invasion: Present Depth of invasion: 19 mm Myometrial thickness: 20 mm Uterine Serosa Involvement: Not identified Cervical stromal involvement: Present Extent of involvement of other organs: NA Lymphovascular invasion: Not identified Regional Lymph Nodes: Examined: 0 Sentinel 2 Non-sentinel 2 Total Lymph nodes with metastasis: 1 Isolated tumor cells (< 0.2 mm): 0 Micrometastasis: (> 0.2 mm and < 2.0 mm): 1 Macrometastasis: (> 2.0 mm): 0 Extracapsular extension: Not identified Tumor  block for ancillary studies: 82F MMR / MSI testing: Pending Pathologic Stage Classification (pTNM, AJCC 8th edition): pT2, pN67m FIGO Stage: IIIC1 Representative Tumor Block: 1A, 1C   03/17/2019 Echocardiogram   1. The left ventricle has normal systolic function, with an ejection fraction of 55-60%. The cavity size was normal. There is moderate concentric left ventricular hypertrophy. Left ventricular diastolic Doppler parameters are consistent with impaired relaxation.  2. The right ventricle has normal systolic function. The cavity was normal. There is no increase in right ventricular wall thickness.  3. GLS: -15.5%, no prior study available for comparison.   03/17/2019 Tumor Marker   Patient's tumor was tested for the following markers: CA-125 Results of the tumor marker test revealed 43   03/20/2019 -  Chemotherapy   The patient had trastuzumab for chemotherapy treatment.     04/30/2019 Tumor Marker   Patient's tumor was tested for the following markers:CA-125 Results of the tumor marker test revealed 29   05/20/2019 Imaging   1. No evidence of metastatic disease. 2. Small pelvic and left paracolic gutter ascites, new. 3. Aortic atherosclerosis (ICD10-170.0). Coronary artery calcification.     05/26/2019 Echocardiogram      1. The left ventricle has normal systolic function with an ejection fraction of 60-65%. The cavity size was normal. There is mildly increased left ventricular wall thickness. Left ventricular diastolic parameters were normal.  2. GLS -16.4% (underestimated due to poor endocardial tracking).  3. The right ventricle has normal systolic function. The cavity was normal. There is no increase in right ventricular wall thickness.  4. Mild calcification of the mitral valve leaflet.  5. Mild calcification of the aortic valve. No stenosis of the aortic valve.  6. The aorta is normal in size and structure.   09/02/2019 Imaging   No evidence of recurrent or metastatic  carcinoma within the abdomen or pelvis.   Colonic diverticulosis. No radiographic evidence of diverticulitis.   Increased large stool burden noted; recommend clinical correlation for possible constipation.   09/02/2019 Echocardiogram   1. Left ventricular ejection fraction, by visual estimation, is 60 to 65%. The left  ventricle has normal function. There is no left ventricular hypertrophy.  2. Global right ventricle has normal systolic function.The right ventricular size is normal. No increase in right ventricular wall thickness.  3. Left atrial size was normal.  4. Right atrial size was normal.  5. The mitral valve is normal in structure. No evidence of mitral valve regurgitation.  6. The tricuspid valve is normal in structure. Tricuspid valve regurgitation is trivial.  7. The aortic valve is normal in structure. Aortic valve regurgitation is not visualized.  8. The pulmonic valve was normal in structure. Pulmonic valve regurgitation is trivial.  9. Mildly elevated pulmonary artery systolic pressure. 10. The average left ventricular global longitudinal strain is -26.9 %.   09/03/2019 Tumor Marker   Patient's tumor was tested for the following markers: CA-125 Results of the tumor marker test revealed 13.5   11/05/2019 Tumor Marker   Patient's tumor was tested for the following markers: CA-125 Results of the tumor marker test revealed 27.   11/25/2019 Imaging   1. New omental nodules suspicious for early peritoneal spread of malignancy. 2. New thickening along the right paracolic gutter concerning for potential tumor. 3. Other imaging findings of potential clinical significance: Mild cardiomegaly. Stable lateral segment left hepatic lobe hypodense lesions, likely cysts. Multilevel posterior decompression with multilevel foraminal impingement. 4. 4 mm left lower lobe pulmonary nodule not readily appreciable on the prior exam, could be inflammatory or neoplastic. Surveillance suggested. 5.  Other imaging findings of potential clinical significance: Mild cardiomegaly. Suspected hepatic cysts. Multilevel lumbar impingement.   Aortic Atherosclerosis (ICD10-I70.0).   11/26/2019 Tumor Marker   Patient's tumor was tested for the following markers: CA-125 Results of the tumor marker test revealed 129   12/11/2019 -  Chemotherapy   The patient had carboplatin and taxol for chemotherapy treatment.     01/01/2020 Tumor Marker   Patient's tumor was tested for the following markers: CA-125 Results of the tumor marker test revealed 123   01/22/2020 Tumor Marker   Patient's tumor was tested for the following markers: CA-125 Results of the tumor marker test revealed 109.   02/11/2020 Imaging   1. Interval worsening of disease as evidenced by new and enlarging peritoneal and omental nodularity. 2. Slight enlargement of retroperitoneal lymph nodes. 3. New small area of low attenuation along the medial margin of the spleen, potentially small capsular implant not seen on the previous study.   Aortic Atherosclerosis (ICD10-I70.0).     02/19/2020 -  Chemotherapy   The patient had pembrolizumab plus Lenvatinib for chemotherapy treatment.     02/19/2020 Tumor Marker   Patient's tumor was tested for the following markers: CA-125 Results of the tumor marker test revealed 308   04/01/2020 Tumor Marker   Patient's tumor was tested for the following markers: CA-125 Results of the tumor marker test revealed 375   04/22/2020 Tumor Marker   Patient's tumor was tested for the following markers: CA-125 Results of the tumor marker test revealed 592   05/04/2020 Imaging   1. Significant interval worsening of omental nodularity and soft tissue caking in the ventral abdomen, which is now essentially confluent and not discretely measurable. There is additional nodularity in the right paracolic gutter. Findings are consistent with worsened omental and peritoneal metastatic disease. 2. There is a new,  ill-defined hypodense lesion of the inferior right lobe of the liver, hepatic segment VI, measuring 2.3 x 1.5 cm, concerning for hepatic metastatic disease. 3. Status post hysterectomy. Soft tissue nodule in the vicinity  of the right vaginal cuff is not significantly changed, measuring 2.1 x 1.4 cm. 4. Prominent retroperitoneal lymph nodes remain subcentimeter and are not significantly changed. Attention on follow-up. 5. Aortic Atherosclerosis (ICD10-I70.0).   05/09/2020 Echocardiogram    1. Normal GLS -23.7. Left ventricular ejection fraction, by estimation, is 60 to 65%. The left ventricle has normal function. The left ventricle has no regional wall motion abnormalities. Left ventricular diastolic parameters were normal.  2. Right ventricular systolic function is normal. The right ventricular size is normal. There is mildly elevated pulmonary artery systolic pressure.  3. The mitral valve is normal in structure. Trivial mitral valve regurgitation. No evidence of mitral stenosis.  4. The aortic valve is normal in structure. Aortic valve regurgitation is not visualized. No aortic stenosis is present.  5. The inferior vena cava is normal in size with greater than 50% respiratory variability, suggesting right atrial pressure of 3 mmHg.   05/12/2020 -  Chemotherapy   The patient had DOXOrubicin HCL LIPOSOMAL (DOXIL) 94 mg in dextrose 5 % 500 mL chemo infusion, 40 mg/m2 = 94 mg, Intravenous,  Once, 2 of 6 cycles Administration: 94 mg (05/12/2020), 94 mg (06/10/2020)  for chemotherapy treatment.      REVIEW OF SYSTEMS:   Constitutional: Denies fevers, chills or abnormal weight loss Eyes: Denies blurriness of vision Ears, nose, mouth, throat, and face: Denies mucositis or sore throat Respiratory: Denies cough, dyspnea or wheezes Cardiovascular: Denies palpitation, chest discomfort or lower extremity swelling Skin: Denies abnormal skin rashes Lymphatics: Denies new lymphadenopathy or easy  bruising Neurological:Denies numbness, tingling or new weaknesses Behavioral/Psych: Mood is stable, no new changes  All other systems were reviewed with the patient and are negative.  I have reviewed the past medical history, past surgical history, social history and family history with the patient and they are unchanged from previous note.  ALLERGIES:  is allergic to tegaderm ag mesh [silver].  MEDICATIONS:  Current Outpatient Medications  Medication Sig Dispense Refill  . diphenhydrAMINE (BENADRYL) 25 mg capsule Take 100 mg by mouth 2 (two) times a day.    . furosemide (LASIX) 20 MG tablet Take 1 tablet (20 mg total) by mouth daily. 30 tablet 11  . gabapentin (NEURONTIN) 400 MG capsule Take 3 capsules (1,200 mg total) by mouth 3 (three) times daily. 270 capsule 11  . levothyroxine (SYNTHROID) 50 MCG tablet Take 1 tablet (50 mcg total) by mouth daily before breakfast. (Patient taking differently: Take 50 mcg by mouth daily before breakfast. Separate from magnesium supplementation by at least 4 hours.) 30 tablet 11  . lidocaine-prilocaine (EMLA) cream Apply to affected area once 30 g 3  . loratadine (CLARITIN) 10 MG tablet Take 10 mg by mouth daily.    Marland Kitchen losartan (COZAAR) 100 MG tablet Take 1.5 tablets (150 mg total) by mouth daily. 90 tablet 2  . magnesium oxide (MAG-OX) 400 (241.3 Mg) MG tablet Take 1 tablet (400 mg total) by mouth daily. 30 tablet 3  . magnesium oxide (MAG-OX) 400 MG tablet Take 1 tablet by mouth daily.    . methadone (DOLOPHINE) 10 MG tablet Take 2 tablets (20 mg total) by mouth every 8 (eight) hours. 90 tablet 0  . metoprolol tartrate (LOPRESSOR) 25 MG tablet Take 25 mg by mouth 2 (two) times daily.    . ondansetron (ZOFRAN) 8 MG tablet Take 8 mg by mouth every 8 (eight) hours as needed.    Marland Kitchen oxyCODONE-acetaminophen (PERCOCET) 10-325 MG tablet Take 1-2 tablets by mouth every  6 (six) hours as needed for pain. 90 tablet 0  . prochlorperazine (COMPAZINE) 10 MG tablet TAKE  1 TABLET BY MOUTH EVERY 6 HOURS AS NEEDED FOR NAUSEA OR VOMITING 30 tablet 1  . ranitidine (ZANTAC) 150 MG capsule Take 150 mg by mouth 2 (two) times daily.    . rivaroxaban (XARELTO) 20 MG TABS tablet Take 1 tablet (20 mg total) by mouth daily with supper. 30 tablet 9  . STOOL SOFTENER 100 MG capsule Take 100 mg by mouth as needed.     No current facility-administered medications for this visit.    PHYSICAL EXAMINATION: ECOG PERFORMANCE STATUS: 1 - Symptomatic but completely ambulatory  Vitals:   06/10/20 0843  BP: (!) 127/59  Pulse: (!) 52  Resp: 17  Temp: 98.8 F (37.1 C)  SpO2: 100%   Filed Weights   06/10/20 0843  Weight: 273 lb 3.2 oz (123.9 kg)    GENERAL:alert, no distress and comfortable SKIN: skin color, texture, turgor are normal, no rashes or significant lesions EYES: normal, Conjunctiva are pink and non-injected, sclera clear OROPHARYNX:no exudate, no erythema and lips, buccal mucosa, and tongue normal  NECK: supple, thyroid normal size, non-tender, without nodularity LYMPH:  no palpable lymphadenopathy in the cervical, axillary or inguinal LUNGS: clear to auscultation and percussion with normal breathing effort HEART: regular rate & rhythm and no murmurs and no lower extremity edema ABDOMEN:abdomen soft, non-tender and normal bowel sounds Musculoskeletal:no cyanosis of digits and no clubbing  NEURO: alert & oriented x 3 with fluent speech, no focal motor/sensory deficits  LABORATORY DATA:  I have reviewed the data as listed    Component Value Date/Time   NA 140 06/10/2020 0821   K 3.6 06/10/2020 0821   CL 101 06/10/2020 0821   CO2 31 06/10/2020 0821   GLUCOSE 127 (H) 06/10/2020 0821   BUN 7 (L) 06/10/2020 0821   CREATININE 0.70 06/10/2020 0821   CALCIUM 9.3 06/10/2020 0821   PROT 6.5 06/10/2020 0821   ALBUMIN 3.1 (L) 06/10/2020 0821   AST 15 06/10/2020 0821   ALT 7 06/10/2020 0821   ALKPHOS 103 06/10/2020 0821   BILITOT 0.4 06/10/2020 0821    GFRNONAA >60 06/10/2020 0821   GFRAA >60 06/10/2020 0821    No results found for: SPEP, UPEP  Lab Results  Component Value Date   WBC 3.1 (L) 06/10/2020   NEUTROABS 1.6 (L) 06/10/2020   HGB 10.4 (L) 06/10/2020   HCT 32.4 (L) 06/10/2020   MCV 100.6 (H) 06/10/2020   PLT 214 06/10/2020      Chemistry      Component Value Date/Time   NA 140 06/10/2020 0821   K 3.6 06/10/2020 0821   CL 101 06/10/2020 0821   CO2 31 06/10/2020 0821   BUN 7 (L) 06/10/2020 0821   CREATININE 0.70 06/10/2020 0821      Component Value Date/Time   CALCIUM 9.3 06/10/2020 0821   ALKPHOS 103 06/10/2020 0821   AST 15 06/10/2020 0821   ALT 7 06/10/2020 0821   BILITOT 0.4 06/10/2020 2542

## 2020-06-14 LAB — CA 125: Cancer Antigen (CA) 125: 2192 U/mL — ABNORMAL HIGH (ref 0.0–38.1)

## 2020-06-15 ENCOUNTER — Telehealth: Payer: Self-pay | Admitting: Oncology

## 2020-06-15 ENCOUNTER — Other Ambulatory Visit: Payer: Self-pay | Admitting: Hematology and Oncology

## 2020-06-15 MED ORDER — METHADONE HCL 10 MG PO TABS
20.0000 mg | ORAL_TABLET | Freq: Three times a day (TID) | ORAL | 0 refills | Status: DC
Start: 2020-06-15 — End: 2020-07-04

## 2020-06-15 NOTE — Telephone Encounter (Signed)
Kathryn [name] called and requested a refill of methadone to be sent to [facility].  She has enough to last her until tomorrow night.

## 2020-06-15 NOTE — Telephone Encounter (Signed)
done

## 2020-06-21 ENCOUNTER — Telehealth: Payer: Self-pay | Admitting: Oncology

## 2020-06-21 NOTE — Telephone Encounter (Signed)
Called Kathlee Nations back and advised her of message from Dr. Alvy Bimler.  She would like the appointment tomorrow and is checking with her daughter to see if she can attend with her.

## 2020-06-21 NOTE — Telephone Encounter (Signed)
Kathryn [name] called and wanted to let Dr. Alvy Bimler know that she has not had a bowel movement since Saturday.  Before then, her bowel movements had been small, frequent and "soupy" in consistency.  She is taking miralax BID and senokot 2 pills three times a day.  She has not had an appetite since her last treatment and has been eating very little.  Her stomach has been growling and she is belching/passing gas. She is wondering if the constipation is caused by not eating and if she should continue taking as much miralax and senokot.  She also mentioned that she is having more pain in her right side under her rib cage that is radiating to her upper stomach area.  She is also having pain down her right leg that is similar to the pain she has in her left leg.  Her pain medication and gabapentin are helping to control the pain.

## 2020-06-21 NOTE — Telephone Encounter (Signed)
The constipation is not caused by not eating She needs to continue miralax and sennokot I can see her tomorrow morning from 940 to for 45 mins if family can bring her in

## 2020-06-22 ENCOUNTER — Encounter: Payer: Self-pay | Admitting: Hematology and Oncology

## 2020-06-22 ENCOUNTER — Inpatient Hospital Stay: Payer: Medicare Other | Attending: Hematology and Oncology | Admitting: Hematology and Oncology

## 2020-06-22 ENCOUNTER — Telehealth: Payer: Self-pay | Admitting: Hematology and Oncology

## 2020-06-22 ENCOUNTER — Other Ambulatory Visit: Payer: Self-pay

## 2020-06-22 VITALS — BP 138/59 | HR 61 | Temp 98.3°F | Resp 18 | Ht 63.0 in | Wt 270.8 lb

## 2020-06-22 DIAGNOSIS — C786 Secondary malignant neoplasm of retroperitoneum and peritoneum: Secondary | ICD-10-CM | POA: Insufficient documentation

## 2020-06-22 DIAGNOSIS — G893 Neoplasm related pain (acute) (chronic): Secondary | ICD-10-CM | POA: Insufficient documentation

## 2020-06-22 DIAGNOSIS — C541 Malignant neoplasm of endometrium: Secondary | ICD-10-CM | POA: Insufficient documentation

## 2020-06-22 DIAGNOSIS — R630 Anorexia: Secondary | ICD-10-CM | POA: Diagnosis not present

## 2020-06-22 DIAGNOSIS — K5909 Other constipation: Secondary | ICD-10-CM | POA: Insufficient documentation

## 2020-06-22 MED ORDER — DEXAMETHASONE 4 MG PO TABS: 4.0000 mg | ORAL_TABLET | Freq: Every day | ORAL | 1 refills | Status: AC

## 2020-06-22 NOTE — Telephone Encounter (Signed)
Scheduled appts per 9/8 sch msg. Pt declined print out of AVS and stated she would refer to mychart.

## 2020-06-22 NOTE — Assessment & Plan Note (Signed)
She has multifactorial causes for constipation We discussed the importance of aggressive laxative therapy

## 2020-06-22 NOTE — Progress Notes (Signed)
[place] OFFICE PROGRESS NOTE  Patient Care Team: Ronita Hipps, MD as PCP - General (Family Medicine) Nicholaus Bloom, MD (Anesthesiology)  ASSESSMENT & PLAN:  Endometrial cancer [facility]) I am concerned about disease progression Her recent tumor marker was elevated She has more abdominal pain and recurrent signs and symptoms of subacute bowel obstruction with severe constipation We discussed the risk and benefits of pursuing an urgent imaging study next week and she is in agreement In the meantime, we will continue aggressive supportive care  Cancer associated pain She has worsening right upper quadrant pain that radiate down to the back and occasionally causing radicular pain to her right leg On review on her prior imaging study, it show peritoneal disease on that side As above, I recommend urgent CT imaging next week She will continue taking her pain medicine as prescribed I will also add low-dose dexamethasone to see if this could help with cancer associated pain  Other constipation She has multifactorial causes for constipation We discussed the importance of aggressive laxative therapy  Anorexia She has poor appetite over the last week, could be either due to severe pain or recent constipation I recommend a trial of low-dose dexamethasone daily until I see her next week   Orders Placed This Encounter  Procedures  . CT ABDOMEN PELVIS W CONTRAST    Standing Status:   Future    Standing Expiration Date:   06/22/2021    Order Specific Question:   If indicated for the ordered procedure, I authorize the administration of contrast media per Radiology protocol    Answer:   Yes    Order Specific Question:   Preferred imaging location?    Answer:   [facility]    Order Specific Question:   Radiology Contrast Protocol - do NOT remove file path    Answer:   [email or web address]    All questions were answered. The patient  knows to call the clinic with any problems, questions or concerns. The total time spent in the appointment was 30 minutes encounter with patients including review of chart and various tests results, discussions about plan of care and coordination of care plan   Heath Lark, MD 06/22/2020 12:23 PM  INTERVAL HISTORY: Please see below for problem oriented charting. 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 A Pap was done 09/02/18 showign ASC-H. She was referred onto GYN and seen by Dr. Paula Compton    09/09/2018 Procedure   She underwent endometrial biopsy   09/12/2018 Pathology Results   Endometrial biopsy positive for adenocarcinoma   09/22/2018 Imaging   Ct abdomen and pelvis showed: Diffuse endometrial thickening, consistent with primary endometrial carcinoma.  Pelvic and abdominal retroperitoneal and retrocrural lymphadenopathy, consistent with metastatic disease.  Moderate hepatic steatosis.   09/25/2018 Cancer  Staging   Staging form: Corpus Uteri - Carcinoma and Carcinosarcoma, AJCC 8th Edition - Pathologic: Stage IIIC2 (pT2, pN2, cM0) - Signed by Heath Lark, MD on 02/23/2019   10/01/2018 Procedure   Successful placement of a right IJ approach  Power Port with ultrasound and fluoroscopic guidance. The catheter is ready for use.   10/02/2018 Tumor Marker   Patient's tumor was tested for the following markers: CA-125 Results of the tumor marker test revealed 578   10/03/2018 - 01/16/2019 Chemotherapy   The patient had carboplatin and Taxol with dose adjustment due to neuropathy x 6 cycles   10/24/2018 Tumor Marker   Patient's tumor was tested for the following markers: CA-125 Results of the tumor marker test revealed 296    Genetic Testing   Patient has genetic testing done for MSI/MMR. Results revealed MSI stable and MMR normal on Accession [id number].   11/14/2018 Tumor Marker   Patient's tumor was tested for the following markers: CA-125 Results of the tumor marker test revealed 81   11/27/2018 Imaging   1. Significant decrease in abdominal retroperitoneal and bilateral iliac lymphadenopathy since previous study. 2. Significant decrease in abnormal endometrial soft tissue density since prior study. 3. No new or progressive metastatic disease identified.   12/05/2018 Tumor Marker   Patient's tumor was tested for the following markers: CA-125 Results of the tumor marker test revealed 46.3   12/26/2018 Tumor Marker   Patient's tumor was tested for the following markers: CA-125 Results of the tumor marker test revealed 38.2   01/16/2019 Tumor Marker   Patient's tumor was tested for the following markers: CA-125 Results of the tumor marker test revealed 30.8   02/03/2019 Imaging   1. Numerous retroperitoneal, iliac, and pelvic lymph nodes are stable or slightly decreased in size compared to immediate prior examination dated 11/27/2018 and significantly decreased in size compared to exam dated 09/20/2018.  2.  Unchanged thickening of the endometrium.  3. Other chronic, incidental, and postoperative findings as detailed above.   02/12/2019 Imaging   1. Uterus +/- tubes/ovaries, neoplastic, cervix, bilateral fallopian tubes  and ovaries - MIXED HIGH-GRADE SEROUS AND ENDOMETRIAL ADENOCARCINOMA, 6.5 CM. SEE NOTE - CARCINOMA INVOLVES ENTIRE ENDOMETRIUM, ANTERIOR AND POSTERIOR LOWER UTERINE SEGMENTS AND ANTERIOR AND POSTERIOR CERVIX. - CARCINOMA INVADES FOR A DEPTH OF 1.9 CM WHERE MYOMETRIAL THICKNESS IS 2.0 CM (GREATER THAN 50% MYOMETRIAL INVASION) - CARCINOMA IS 1 MM FROM THE CERVICAL MARGIN - NEGATIVE FOR LYMPHOVASCULAR OR PERINEURAL INVASION - BILATERAL UNREMARKABLE FALLOPIAN TUBES AND OVARIES, NEGATIVE FOR CARCINOMA - SEE ONCOLOGY TABLE 2. Lymph node, biopsy, right pelvic - METASTATIC CARCINOMA TO ONE OF TWO LYMPH NODES (1/2) Microscopic Comment 1. (v4.1.0.0) UTERUS, CARCINOMA OR CARCINOSARCOMA Procedure: Total hysterectomy with bilateral salpingo-oophorectomy Histologic type: Mixed high-grade serous and endometrioid adenocarcinoma Histologic Grade: NA Myometrial invasion: Present Depth of invasion: 19 mm Myometrial thickness: 20 mm Uterine Serosa Involvement: Not identified Cervical stromal involvement: Present Extent of involvement of other organs: NA Lymphovascular invasion: Not identified Regional Lymph Nodes: Examined: 0 Sentinel 2 Non-sentinel 2 Total Lymph nodes with metastasis: 1 Isolated tumor cells (< 0.2 mm): 0 Micrometastasis: (> 0.2 mm and < 2.0 mm): 1 Macrometastasis: (> 2.0 mm): 0 Extracapsular extension: Not identified Tumor block for ancillary studies: 54F MMR / MSI testing: Pending Pathologic Stage Classification (pTNM, AJCC 8th edition): pT2, pN49m FIGO Stage: IIIC1 Representative Tumor Block: 1A, 1C (v4.1.0.0) Diagnosis Note 1. Immunohistochemical stains for p16, p53, ER and Ki-67 show a pattern of staining consistent with mixed high-grade  serous and endometrial adenocarcinoma. Dr. Lyndon Code has reviewed this case and concurs with the above interpretation. A molecular study for microsatellite instability (MSI) and immunostains for MMR-related proteins are pending and will be  reported in an addendum.   02/12/2019 Surgery   Pre-operative Diagnosis: endometrial cancer stage IIIC2, s/p neoadjuvant chemotherapy, extreme obesity (BMI 53kg/m2)  Operation: Robotic-assisted laparoscopic total hysterectomy with bilateral salpingoophorectomy, right pelvic lymphadenectomy. 22 modifier for extreme obesity  Surgeon: Donaciano Eva  Assistant Surgeon: Lahoma Crocker MD  Operative Findings:  : extreme obesity, BMI 53kg/m2, with significant retroperitoneal and intraperitoneal adiposity. Obesity necessitated an additional hour of operating time to create safe exposure. Obesity required additional personnel in the operating room for positioning and retraction. Severe obesity substantially increased the complexity of the procedure. 8cm uterus, grossly normal, normal appearing cervix. Retroperitoneal fibrosis consistent with nodal positivity. Clinically suspicious right pelvic lymph node. Unable to completely visualize retroperitoneal nodes due to extreme obesity.    02/12/2019 Pathology Results   IMMUNOHISTOCHEMICAL AND MORPHOMETRIC ANALYSIS PERFORMED MANUALLY The tumor cells are POSITIVE for Her2 (3+). Estrogen Receptor: 70%, POSITIVE, STRONG STAINING INTENSITY Progesterone Receptor: 70%, POSITIVE, STRONG STAINING INTENSITY Proliferation Marker Ki67: 20% REFERENCE RANGE ESTROGEN RECEPTOR NEGATIVE 0% POSITIVE =>1% REFERENCE RANGE PROGESTERONE RECEPTOR NEGATIVE 0% POSITIVE =>1% All controls stained appropriately  1. Uterus +/- tubes/ovaries, neoplastic, cervix, bilateral fallopian tubes and ovaries - MIXED HIGH-GRADE SEROUS AND ENDOMETRIAL ADENOCARCINOMA, 6.5 CM. SEE NOTE - CARCINOMA INVOLVES ENTIRE ENDOMETRIUM, ANTERIOR AND POSTERIOR LOWER UTERINE SEGMENTS AND ANTERIOR AND POSTERIOR CERVIX. - CARCINOMA INVADES FOR A DEPTH OF 1.9 CM WHERE MYOMETRIAL THICKNESS IS 2.0 CM (GREATER THAN 50% MYOMETRIAL INVASION) - CARCINOMA IS 1 MM FROM THE CERVICAL MARGIN - NEGATIVE  FOR LYMPHOVASCULAR OR PERINEURAL INVASION - BILATERAL UNREMARKABLE FALLOPIAN TUBES AND OVARIES, NEGATIVE FOR CARCINOMA - SEE ONCOLOGY TABLE 2. Lymph node, biopsy, right pelvic - METASTATIC CARCINOMA TO ONE OF TWO LYMPH NODES (1/2) Microscopic Comment 1. (v4.1.0.0) UTERUS, CARCINOMA OR CARCINOSARCOMA Procedure: Total hysterectomy with bilateral salpingo-oophorectomy Histologic type: Mixed high-grade serous and endometrioid adenocarcinoma Histologic Grade: NA Myometrial invasion: Present Depth of invasion: 19 mm Myometrial thickness: 20 mm Uterine Serosa Involvement: Not identified Cervical stromal involvement: Present Extent of involvement of other organs: NA Lymphovascular invasion: Not identified Regional Lymph Nodes: Examined: 0 Sentinel 2 Non-sentinel 2 Total Lymph nodes with metastasis: 1 Isolated tumor cells (< 0.2 mm): 0 Micrometastasis: (> 0.2 mm and < 2.0 mm): 1 Macrometastasis: (> 2.0 mm): 0 Extracapsular extension: Not identified Tumor block for ancillary studies: 53F MMR / MSI testing: Pending Pathologic Stage Classification (pTNM, AJCC 8th edition): pT2, pN21m FIGO Stage: IIIC1 Representative Tumor Block: 1A, 1C   03/17/2019 Echocardiogram   1. The left ventricle has normal systolic function, with an ejection fraction of 55-60%. The cavity size was normal. There is moderate concentric left ventricular hypertrophy. Left ventricular diastolic Doppler parameters are consistent with impaired relaxation.  2. The right ventricle has normal systolic function. The cavity was normal. There is no increase in right ventricular wall thickness.  3. GLS: -15.5%, no prior study available for comparison.   03/17/2019 Tumor Marker   Patient's tumor was tested for the following markers: CA-125 Results of the tumor marker test revealed 43   03/20/2019 -  Chemotherapy   The patient had trastuzumab for chemotherapy treatment.     04/30/2019 Tumor Marker   Patient's tumor was tested for  the following markers:CA-125 Results of the tumor marker test revealed 29   05/20/2019 Imaging   1.  No evidence of metastatic disease. 2. Small pelvic and left paracolic gutter ascites, new. 3. Aortic atherosclerosis (ICD10-170.0). Coronary artery calcification.     05/26/2019 Echocardiogram      1. The left ventricle has normal systolic function with an ejection fraction of 60-65%. The cavity size was normal. There is mildly increased left ventricular wall thickness. Left ventricular diastolic parameters were normal.  2. GLS -16.4% (underestimated due to poor endocardial tracking).  3. The right ventricle has normal systolic function. The cavity was normal. There is no increase in right ventricular wall thickness.  4. Mild calcification of the mitral valve leaflet.  5. Mild calcification of the aortic valve. No stenosis of the aortic valve.  6. The aorta is normal in size and structure.   09/02/2019 Imaging   No evidence of recurrent or metastatic carcinoma within the abdomen or pelvis.   Colonic diverticulosis. No radiographic evidence of diverticulitis.   Increased large stool burden noted; recommend clinical correlation for possible constipation.   09/02/2019 Echocardiogram   1. Left ventricular ejection fraction, by visual estimation, is 60 to 65%. The left ventricle has normal function. There is no left ventricular hypertrophy.  2. Global right ventricle has normal systolic function.The right ventricular size is normal. No increase in right ventricular wall thickness.  3. Left atrial size was normal.  4. Right atrial size was normal.  5. The mitral valve is normal in structure. No evidence of mitral valve regurgitation.  6. The tricuspid valve is normal in structure. Tricuspid valve regurgitation is trivial.  7. The aortic valve is normal in structure. Aortic valve regurgitation is not visualized.  8. The pulmonic valve was normal in structure. Pulmonic valve regurgitation is  trivial.  9. Mildly elevated pulmonary artery systolic pressure. 10. The average left ventricular global longitudinal strain is -26.9 %.   09/03/2019 Tumor Marker   Patient's tumor was tested for the following markers: CA-125 Results of the tumor marker test revealed 13.5   11/05/2019 Tumor Marker   Patient's tumor was tested for the following markers: CA-125 Results of the tumor marker test revealed 27.   11/25/2019 Imaging   1. New omental nodules suspicious for early peritoneal spread of malignancy. 2. New thickening along the right paracolic gutter concerning for potential tumor. 3. Other imaging findings of potential clinical significance: Mild cardiomegaly. Stable lateral segment left hepatic lobe hypodense lesions, likely cysts. Multilevel posterior decompression with multilevel foraminal impingement. 4. 4 mm left lower lobe pulmonary nodule not readily appreciable on the prior exam, could be inflammatory or neoplastic. Surveillance suggested. 5. Other imaging findings of potential clinical significance: Mild cardiomegaly. Suspected hepatic cysts. Multilevel lumbar impingement.   Aortic Atherosclerosis (ICD10-I70.0).   11/26/2019 Tumor Marker   Patient's tumor was tested for the following markers: CA-125 Results of the tumor marker test revealed 129   12/11/2019 -  Chemotherapy   The patient had carboplatin and taxol for chemotherapy treatment.     01/01/2020 Tumor Marker   Patient's tumor was tested for the following markers: CA-125 Results of the tumor marker test revealed 123   01/22/2020 Tumor Marker   Patient's tumor was tested for the following markers: CA-125 Results of the tumor marker test revealed 109.   02/11/2020 Imaging   1. Interval worsening of disease as evidenced by new and enlarging peritoneal and omental nodularity. 2. Slight enlargement of retroperitoneal lymph nodes. 3. New small area of low attenuation along the medial margin of the spleen, potentially  small capsular implant not seen  on the previous study.   Aortic Atherosclerosis (ICD10-I70.0).     02/19/2020 -  Chemotherapy   The patient had pembrolizumab plus Lenvatinib for chemotherapy treatment.     02/19/2020 Tumor Marker   Patient's tumor was tested for the following markers: CA-125 Results of the tumor marker test revealed 308   04/01/2020 Tumor Marker   Patient's tumor was tested for the following markers: CA-125 Results of the tumor marker test revealed 375   04/22/2020 Tumor Marker   Patient's tumor was tested for the following markers: CA-125 Results of the tumor marker test revealed 592   05/04/2020 Imaging   1. Significant interval worsening of omental nodularity and soft tissue caking in the ventral abdomen, which is now essentially confluent and not discretely measurable. There is additional nodularity in the right paracolic gutter. Findings are consistent with worsened omental and peritoneal metastatic disease. 2. There is a new, ill-defined hypodense lesion of the inferior right lobe of the liver, hepatic segment VI, measuring 2.3 x 1.5 cm, concerning for hepatic metastatic disease. 3. Status post hysterectomy. Soft tissue nodule in the vicinity of the right vaginal cuff is not significantly changed, measuring 2.1 x 1.4 cm. 4. Prominent retroperitoneal lymph nodes remain subcentimeter and are not significantly changed. Attention on follow-up. 5. Aortic Atherosclerosis (ICD10-I70.0).   05/09/2020 Echocardiogram    1. Normal GLS -23.7. Left ventricular ejection fraction, by estimation, is 60 to 65%. The left ventricle has normal function. The left ventricle has no regional wall motion abnormalities. Left ventricular diastolic parameters were normal.  2. Right ventricular systolic function is normal. The right ventricular size is normal. There is mildly elevated pulmonary artery systolic pressure.  3. The mitral valve is normal in structure. Trivial mitral valve regurgitation.  No evidence of mitral stenosis.  4. The aortic valve is normal in structure. Aortic valve regurgitation is not visualized. No aortic stenosis is present.  5. The inferior vena cava is normal in size with greater than 50% respiratory variability, suggesting right atrial pressure of 3 mmHg.   05/12/2020 -  Chemotherapy   The patient had DOXOrubicin HCL LIPOSOMAL (DOXIL) 94 mg in dextrose 5 % 500 mL chemo infusion, 40 mg/m2 = 94 mg, Intravenous,  Once, 2 of 6 cycles Administration: 94 mg (05/12/2020), 94 mg (06/10/2020)  for chemotherapy treatment.    06/13/2020 Tumor Marker   Patient's tumor was tested for the following markers: CA-125 Results of the tumor marker test revealed 2192     REVIEW OF SYSTEMS:   Constitutional: Denies fevers, chills or abnormal weight loss Eyes: Denies blurriness of vision Ears, nose, mouth, throat, and face: Denies mucositis or sore throat Respiratory: Denies cough, dyspnea or wheezes Cardiovascular: Denies palpitation, chest discomfort or lower extremity swelling Skin: Denies abnormal skin rashes Lymphatics: Denies new lymphadenopathy or easy bruising Neurological:Denies numbness, tingling or new weaknesses Behavioral/Psych: Mood is stable, no new changes  All other systems were reviewed with the patient and are negative.  I have reviewed the past medical history, past surgical history, social history and family history with the patient and they are unchanged from previous note.  ALLERGIES:  is allergic to tegaderm ag mesh [silver].  MEDICATIONS:  Current Outpatient Medications  Medication Sig Dispense Refill  . dexamethasone (DECADRON) 4 MG tablet Take 1 tablet (4 mg total) by mouth daily. 30 tablet 1  . diphenhydrAMINE (BENADRYL) 25 mg capsule Take 100 mg by mouth 2 (two) times a day.    . furosemide (LASIX) 20 MG tablet Take  1 tablet (20 mg total) by mouth daily. 30 tablet 11  . gabapentin (NEURONTIN) 400 MG capsule Take 3 capsules (1,200 mg total) by  mouth 3 (three) times daily. 270 capsule 11  . levothyroxine (SYNTHROID) 50 MCG tablet Take 1 tablet (50 mcg total) by mouth daily before breakfast. (Patient taking differently: Take 50 mcg by mouth daily before breakfast. Separate from magnesium supplementation by at least 4 hours.) 30 tablet 11  . lidocaine-prilocaine (EMLA) cream Apply to affected area once 30 g 3  . loratadine (CLARITIN) 10 MG tablet Take 10 mg by mouth daily.    Marland Kitchen losartan (COZAAR) 100 MG tablet Take 1.5 tablets (150 mg total) by mouth daily. 90 tablet 2  . magnesium oxide (MAG-OX) 400 (241.3 Mg) MG tablet Take 1 tablet (400 mg total) by mouth daily. 30 tablet 3  . magnesium oxide (MAG-OX) 400 MG tablet Take 1 tablet by mouth daily.    . methadone (DOLOPHINE) 10 MG tablet Take 2 tablets (20 mg total) by mouth every 8 (eight) hours. 90 tablet 0  . metoprolol tartrate (LOPRESSOR) 25 MG tablet Take 25 mg by mouth 2 (two) times daily.    . ondansetron (ZOFRAN) 8 MG tablet Take 8 mg by mouth every 8 (eight) hours as needed.    Marland Kitchen oxyCODONE-acetaminophen (PERCOCET) 10-325 MG tablet Take 1-2 tablets by mouth every 6 (six) hours as needed for pain. 90 tablet 0  . prochlorperazine (COMPAZINE) 10 MG tablet TAKE 1 TABLET BY MOUTH EVERY 6 HOURS AS NEEDED FOR NAUSEA OR VOMITING 30 tablet 1  . ranitidine (ZANTAC) 150 MG capsule Take 150 mg by mouth 2 (two) times daily.    . rivaroxaban (XARELTO) 20 MG TABS tablet Take 1 tablet (20 mg total) by mouth daily with supper. 30 tablet 9  . STOOL SOFTENER 100 MG capsule Take 100 mg by mouth as needed.     No current facility-administered medications for this visit.    PHYSICAL EXAMINATION: ECOG PERFORMANCE STATUS: 2 - Symptomatic, <50% confined to bed  Vitals:   06/22/20 0946  BP: (!) 138/59  Pulse: 61  Resp: 18  Temp: 98.3 F (36.8 C)  SpO2: 99%   Filed Weights   06/22/20 0946  Weight: 270 lb 12.8 oz (122.8 kg)    GENERAL:alert, no distress and comfortable SKIN: skin color,  texture, turgor are normal, no rashes or significant lesions EYES: normal, Conjunctiva are pink and non-injected, sclera clear OROPHARYNX:no exudate, no erythema and lips, buccal mucosa, and tongue normal  NECK: supple, thyroid normal size, non-tender, without nodularity LYMPH:  no palpable lymphadenopathy in the cervical, axillary or inguinal LUNGS: clear to auscultation and percussion with normal breathing effort HEART: regular rate & rhythm and no murmurs and no lower extremity edema ABDOMEN: Limited examination due to central obesity.  She has no evidence of rebound or guarding but have some discomfort on the right upper quadrant Musculoskeletal:no cyanosis of digits and no clubbing  NEURO: alert & oriented x 3 with fluent speech, no focal motor/sensory deficits  LABORATORY DATA:  I have reviewed the data as listed    Component Value Date/Time   NA 140 06/10/2020 0821   K 3.6 06/10/2020 0821   CL 101 06/10/2020 0821   CO2 31 06/10/2020 0821   GLUCOSE 127 (H) 06/10/2020 0821   BUN 7 (L) 06/10/2020 0821   CREATININE 0.70 06/10/2020 0821   CALCIUM 9.3 06/10/2020 0821   PROT 6.5 06/10/2020 0821   ALBUMIN 3.1 (L) 06/10/2020 7017  AST 15 06/10/2020 0821   ALT 7 06/10/2020 0821   ALKPHOS 103 06/10/2020 0821   BILITOT 0.4 06/10/2020 0821   GFRNONAA >60 06/10/2020 0821   GFRAA >60 06/10/2020 0821    No results found for: SPEP, UPEP  Lab Results  Component Value Date   WBC 3.1 (L) 06/10/2020   NEUTROABS 1.6 (L) 06/10/2020   HGB 10.4 (L) 06/10/2020   HCT 32.4 (L) 06/10/2020   MCV 100.6 (H) 06/10/2020   PLT 214 06/10/2020      Chemistry      Component Value Date/Time   NA 140 06/10/2020 0821   K 3.6 06/10/2020 0821   CL 101 06/10/2020 0821   CO2 31 06/10/2020 0821   BUN 7 (L) 06/10/2020 0821   CREATININE 0.70 06/10/2020 0821      Component Value Date/Time   CALCIUM 9.3 06/10/2020 0821   ALKPHOS 103 06/10/2020 0821   AST 15 06/10/2020 0821   ALT 7 06/10/2020 0821    BILITOT 0.4 06/10/2020 1994

## 2020-06-22 NOTE — Assessment & Plan Note (Signed)
I am concerned about disease progression Her recent tumor marker was elevated She has more abdominal pain and recurrent signs and symptoms of subacute bowel obstruction with severe constipation We discussed the risk and benefits of pursuing an urgent imaging study next week and she is in agreement In the meantime, we will continue aggressive supportive care

## 2020-06-22 NOTE — Assessment & Plan Note (Signed)
She has poor appetite over the last week, could be either due to severe pain or recent constipation I recommend a trial of low-dose dexamethasone daily until I see her next week

## 2020-06-22 NOTE — Assessment & Plan Note (Signed)
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

## 2020-06-29 ENCOUNTER — Other Ambulatory Visit: Payer: Self-pay

## 2020-06-29 ENCOUNTER — Ambulatory Visit (HOSPITAL_COMMUNITY)
Admission: RE | Admit: 2020-06-29 | Discharge: 2020-06-29 | Disposition: A | Discharge status: Home / Self Care | Payer: Medicare Other | Source: Ambulatory Visit | Attending: Hematology and Oncology | Admitting: Hematology and Oncology

## 2020-06-29 DIAGNOSIS — C541 Malignant neoplasm of endometrium: Secondary | ICD-10-CM | POA: Diagnosis not present

## 2020-06-29 DIAGNOSIS — N1330 Unspecified hydronephrosis: Secondary | ICD-10-CM | POA: Diagnosis not present

## 2020-06-29 MED ORDER — IOHEXOL 300 MG/ML  SOLN
100.0000 mL | Freq: Once | INTRAMUSCULAR | Status: AC | PRN
  Administered 2020-06-29: 100 mL via INTRAVENOUS

## 2020-06-29 MED ORDER — HEPARIN SOD (PORK) LOCK FLUSH 100 UNIT/ML IV SOLN
INTRAVENOUS | Status: AC
  Filled 2020-06-29: qty 5

## 2020-06-30 ENCOUNTER — Telehealth: Payer: Self-pay | Admitting: Hematology and Oncology

## 2020-06-30 ENCOUNTER — Telehealth: Payer: Self-pay

## 2020-06-30 ENCOUNTER — Inpatient Hospital Stay: Payer: Medicare Other | Admitting: Hematology and Oncology

## 2020-06-30 ENCOUNTER — Encounter: Payer: Self-pay | Admitting: Hematology and Oncology

## 2020-06-30 ENCOUNTER — Other Ambulatory Visit: Payer: Self-pay

## 2020-06-30 DIAGNOSIS — G893 Neoplasm related pain (acute) (chronic): Secondary | ICD-10-CM | POA: Diagnosis not present

## 2020-06-30 DIAGNOSIS — K5909 Other constipation: Secondary | ICD-10-CM

## 2020-06-30 DIAGNOSIS — C541 Malignant neoplasm of endometrium: Secondary | ICD-10-CM

## 2020-06-30 DIAGNOSIS — I10 Essential (primary) hypertension: Secondary | ICD-10-CM | POA: Diagnosis not present

## 2020-06-30 DIAGNOSIS — C786 Secondary malignant neoplasm of retroperitoneum and peritoneum: Secondary | ICD-10-CM | POA: Diagnosis not present

## 2020-06-30 DIAGNOSIS — R630 Anorexia: Secondary | ICD-10-CM

## 2020-06-30 DIAGNOSIS — J90 Pleural effusion, not elsewhere classified: Secondary | ICD-10-CM

## 2020-06-30 DIAGNOSIS — Z7189 Other specified counseling: Secondary | ICD-10-CM

## 2020-06-30 NOTE — Telephone Encounter (Signed)
Scheduled appts per 9/16 sch msg. Gave pt a print out of AVS.

## 2020-06-30 NOTE — Telephone Encounter (Signed)
Called [facility] [place] with referral for Hospice. Info given to [facility]. She will call her tomorrow.

## 2020-06-30 NOTE — Assessment & Plan Note (Signed)
Unfortunately, she has significant disease progression on imaging study Clinically, she continues to have intermittent abdominal pain and poor appetite We discussed the risk and benefits of continuing further palliative chemotherapy versus focusing on quality of life through palliative care/hospice She is in agreement to stop treatment We will refer her to palliative care/hospice program I will see her back in a month for further follow-up

## 2020-06-30 NOTE — Assessment & Plan Note (Signed)
She understood goals of care is palliative in nature She has advanced directives and living will She does not want to be resuscitated in the event of terminal illness I gave her a DNR order I will send referral for hospice

## 2020-06-30 NOTE — Assessment & Plan Note (Signed)
This is due to malignancy She is currently not symptomatic I will reassess in her next visit

## 2020-06-30 NOTE — Assessment & Plan Note (Signed)
She will continue dexamethasone as needed With further weight loss, I am concerned about low blood pressure Recommend discontinuation of furosemide She can consider discontinuation of Cozaar and metoprolol in the future if she continues to have progressive weight loss

## 2020-06-30 NOTE — Assessment & Plan Note (Signed)
She has reasonable pain control right now She will continue methadone We discussed narcotic refill policy

## 2020-06-30 NOTE — Telephone Encounter (Signed)
-----   Message from Heath Lark, MD sent at 06/30/2020  3:05 PM EDT ----- Regarding: pls send referral for hospice. preferably [place]

## 2020-06-30 NOTE — Progress Notes (Signed)
[place] OFFICE PROGRESS NOTE  Patient Care Team: Ronita Hipps, MD as PCP - General (Family Medicine) Nicholaus Bloom, MD (Anesthesiology)  ASSESSMENT & PLAN:  Endometrial cancer [facility]) Unfortunately, she has significant disease progression on imaging study Clinically, she continues to have intermittent abdominal pain and poor appetite We discussed the risk and benefits of continuing further palliative chemotherapy versus focusing on quality of life through palliative care/hospice She is in agreement to stop treatment We will refer her to palliative care/hospice program I will see her back in a month for further follow-up  Cancer associated pain She has reasonable pain control right now She will continue methadone We discussed narcotic refill policy   Essential hypertension I recommend discontinuation of furosemide to reduce risk of dehydration She will continue metoprolol and Cozaar for now  Anorexia She will continue dexamethasone as needed With further weight loss, I am concerned about low blood pressure Recommend discontinuation of furosemide She can consider discontinuation of Cozaar and metoprolol in the future if she continues to have progressive weight loss  Other constipation We discussed the importance of laxative therapy  Goals of care, counseling/discussion She understood goals of care is palliative in nature She has advanced directives and living will She does not want to be resuscitated in the event of terminal illness I gave her a DNR order I will send referral for hospice  Pleural effusion on right This is due to malignancy She is currently not symptomatic I will reassess in her next visit   No orders of the defined types were placed in this encounter.   All questions were answered. The patient knows to call the clinic with any problems, questions or concerns. The total time spent in the appointment was 40 minutes encounter with  patients including review of chart and various tests results, discussions about plan of care and coordination of care plan   Heath Lark, MD 06/30/2020 3:04 PM  INTERVAL HISTORY: Please see below for problem oriented charting. 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 A Pap was done 09/02/18 showign ASC-H. She was referred onto GYN and seen by Dr. Paula Compton    09/09/2018 Procedure   She underwent endometrial biopsy   09/12/2018 Pathology Results   Endometrial biopsy positive for adenocarcinoma   09/22/2018 Imaging   Ct abdomen and pelvis showed: Diffuse endometrial thickening, consistent with primary endometrial carcinoma.  Pelvic and abdominal retroperitoneal and retrocrural lymphadenopathy, consistent with metastatic disease.  Moderate hepatic steatosis.   09/25/2018 Cancer Staging   Staging form: Corpus Uteri - Carcinoma and Carcinosarcoma, AJCC 8th Edition - Pathologic: Stage IIIC2 (pT2, pN2, cM0) - Signed by Heath Lark, MD on 02/23/2019   10/01/2018 Procedure   Successful placement of a right IJ approach Power Port with ultrasound and fluoroscopic guidance. The catheter is ready for use.   10/02/2018 Tumor Marker   Patient's tumor was tested for the following markers: CA-125  Results of the tumor marker test revealed 578   10/03/2018 - 01/16/2019 Chemotherapy    The patient had carboplatin and Taxol with dose adjustment due to neuropathy x 6 cycles   10/24/2018 Tumor Marker   Patient's tumor was tested for the following markers: CA-125 Results of the tumor marker test revealed 296    Genetic Testing   Patient has genetic testing done for MSI/MMR. Results revealed MSI stable and MMR normal on Accession [id number].   11/14/2018 Tumor Marker   Patient's tumor was tested for the following markers: CA-125 Results of the tumor marker test revealed 81   11/27/2018 Imaging   1. Significant decrease in abdominal retroperitoneal and bilateral iliac lymphadenopathy since previous study. 2. Significant decrease in abnormal endometrial soft tissue density since prior study. 3. No new or progressive metastatic disease identified.   12/05/2018 Tumor Marker   Patient's tumor was tested for the following markers: CA-125 Results of the tumor marker test revealed 46.3   12/26/2018 Tumor Marker   Patient's tumor was tested for the following markers: CA-125 Results of the tumor marker test revealed 38.2   01/16/2019 Tumor Marker   Patient's tumor was tested for the following markers: CA-125 Results of the tumor marker test revealed 30.8   02/03/2019 Imaging   1. Numerous retroperitoneal, iliac, and pelvic lymph nodes are stable or slightly decreased in size compared to immediate prior examination dated 11/27/2018 and significantly decreased in size compared to exam dated 09/20/2018.  2.  Unchanged thickening of the endometrium.  3. Other chronic, incidental, and postoperative findings as detailed above.   02/12/2019 Imaging   1. Uterus +/- tubes/ovaries, neoplastic, cervix, bilateral fallopian tubes and ovaries - MIXED HIGH-GRADE SEROUS AND ENDOMETRIAL ADENOCARCINOMA, 6.5 CM. SEE NOTE - CARCINOMA INVOLVES ENTIRE ENDOMETRIUM, ANTERIOR AND POSTERIOR LOWER UTERINE SEGMENTS AND ANTERIOR AND POSTERIOR CERVIX. - CARCINOMA INVADES FOR A DEPTH OF 1.9 CM WHERE  MYOMETRIAL THICKNESS IS 2.0 CM (GREATER THAN 50% MYOMETRIAL INVASION) - CARCINOMA IS 1 MM FROM THE CERVICAL MARGIN - NEGATIVE FOR LYMPHOVASCULAR OR PERINEURAL INVASION - BILATERAL UNREMARKABLE FALLOPIAN TUBES AND OVARIES, NEGATIVE FOR CARCINOMA - SEE ONCOLOGY TABLE 2. Lymph node, biopsy, right pelvic - METASTATIC CARCINOMA TO ONE OF TWO LYMPH NODES (1/2) Microscopic Comment 1. (v4.1.0.0) UTERUS, CARCINOMA OR CARCINOSARCOMA Procedure: Total hysterectomy with bilateral salpingo-oophorectomy Histologic type: Mixed high-grade serous and endometrioid adenocarcinoma Histologic Grade: NA Myometrial invasion: Present Depth of invasion: 19 mm Myometrial thickness: 20 mm Uterine Serosa Involvement: Not identified Cervical stromal involvement: Present Extent of involvement of other organs: NA Lymphovascular invasion: Not identified Regional Lymph Nodes: Examined: 0 Sentinel 2 Non-sentinel 2 Total Lymph nodes with metastasis: 1 Isolated tumor cells (< 0.2 mm): 0 Micrometastasis: (> 0.2 mm and < 2.0 mm): 1 Macrometastasis: (> 2.0 mm): 0 Extracapsular extension: Not identified Tumor block for ancillary studies: 48F MMR / MSI testing: Pending Pathologic Stage Classification (pTNM, AJCC 8th edition): pT2, pN61m FIGO Stage: IIIC1 Representative Tumor Block: 1A, 1C (v4.1.0.0) Diagnosis Note 1. Immunohistochemical stains for p16, p53, ER and Ki-67 show a pattern of staining consistent with mixed high-grade serous and endometrial adenocarcinoma. Dr. KLyndon Codehas reviewed this case and concurs with the above interpretation. A molecular study for microsatellite instability (MSI) and immunostains for MMR-related proteins are pending and will be reported in an addendum.   02/12/2019 Surgery   Pre-operative Diagnosis: endometrial cancer stage IIIC2, s/p neoadjuvant chemotherapy, extreme obesity (BMI 53kg/m2)  Operation: Robotic-assisted laparoscopic total hysterectomy with bilateral salpingoophorectomy,  right pelvic lymphadenectomy. 22 modifier  for extreme obesity  Surgeon: Donaciano Eva  Assistant Surgeon: Lahoma Crocker MD  Operative Findings:  : extreme obesity, BMI 53kg/m2, with significant retroperitoneal and intraperitoneal adiposity. Obesity necessitated an additional hour of operating time to create safe exposure. Obesity required additional personnel in the operating room for positioning and retraction. Severe obesity substantially increased the complexity of the procedure. 8cm uterus, grossly normal, normal appearing cervix. Retroperitoneal fibrosis consistent with nodal positivity. Clinically suspicious right pelvic lymph node. Unable to completely visualize retroperitoneal nodes due to extreme obesity.    02/12/2019 Pathology Results   IMMUNOHISTOCHEMICAL AND MORPHOMETRIC ANALYSIS PERFORMED MANUALLY The tumor cells are POSITIVE for Her2 (3+). Estrogen Receptor: 70%, POSITIVE, STRONG STAINING INTENSITY Progesterone Receptor: 70%, POSITIVE, STRONG STAINING INTENSITY Proliferation Marker Ki67: 20% REFERENCE RANGE ESTROGEN RECEPTOR NEGATIVE 0% POSITIVE =>1% REFERENCE RANGE PROGESTERONE RECEPTOR NEGATIVE 0% POSITIVE =>1% All controls stained appropriately  1. Uterus +/- tubes/ovaries, neoplastic, cervix, bilateral fallopian tubes and ovaries - MIXED HIGH-GRADE SEROUS AND ENDOMETRIAL ADENOCARCINOMA, 6.5 CM. SEE NOTE - CARCINOMA INVOLVES ENTIRE ENDOMETRIUM, ANTERIOR AND POSTERIOR LOWER UTERINE SEGMENTS AND ANTERIOR AND POSTERIOR CERVIX. - CARCINOMA INVADES FOR A DEPTH OF 1.9 CM WHERE MYOMETRIAL THICKNESS IS 2.0 CM (GREATER THAN 50% MYOMETRIAL INVASION) - CARCINOMA IS 1 MM FROM THE CERVICAL MARGIN - NEGATIVE FOR LYMPHOVASCULAR OR PERINEURAL INVASION - BILATERAL UNREMARKABLE FALLOPIAN TUBES AND OVARIES, NEGATIVE FOR CARCINOMA - SEE ONCOLOGY TABLE 2. Lymph node, biopsy, right pelvic - METASTATIC CARCINOMA TO ONE OF TWO LYMPH NODES (1/2) Microscopic Comment 1.  (v4.1.0.0) UTERUS, CARCINOMA OR CARCINOSARCOMA Procedure: Total hysterectomy with bilateral salpingo-oophorectomy Histologic type: Mixed high-grade serous and endometrioid adenocarcinoma Histologic Grade: NA Myometrial invasion: Present Depth of invasion: 19 mm Myometrial thickness: 20 mm Uterine Serosa Involvement: Not identified Cervical stromal involvement: Present Extent of involvement of other organs: NA Lymphovascular invasion: Not identified Regional Lymph Nodes: Examined: 0 Sentinel 2 Non-sentinel 2 Total Lymph nodes with metastasis: 1 Isolated tumor cells (< 0.2 mm): 0 Micrometastasis: (> 0.2 mm and < 2.0 mm): 1 Macrometastasis: (> 2.0 mm): 0 Extracapsular extension: Not identified Tumor block for ancillary studies: 70F MMR / MSI testing: Pending Pathologic Stage Classification (pTNM, AJCC 8th edition): pT2, pN43m FIGO Stage: IIIC1 Representative Tumor Block: 1A, 1C   03/17/2019 Echocardiogram   1. The left ventricle has normal systolic function, with an ejection fraction of 55-60%. The cavity size was normal. There is moderate concentric left ventricular hypertrophy. Left ventricular diastolic Doppler parameters are consistent with impaired relaxation.  2. The right ventricle has normal systolic function. The cavity was normal. There is no increase in right ventricular wall thickness.  3. GLS: -15.5%, no prior study available for comparison.   03/17/2019 Tumor Marker   Patient's tumor was tested for the following markers: CA-125 Results of the tumor marker test revealed 43   03/20/2019 -  Chemotherapy   The patient had trastuzumab for chemotherapy treatment.     04/30/2019 Tumor Marker   Patient's tumor was tested for the following markers:CA-125 Results of the tumor marker test revealed 29   05/20/2019 Imaging   1. No evidence of metastatic disease. 2. Small pelvic and left paracolic gutter ascites, new. 3. Aortic atherosclerosis (ICD10-170.0). Coronary artery  calcification.     05/26/2019 Echocardiogram      1. The left ventricle has normal systolic function with an ejection fraction of 60-65%. The cavity size was normal. There is mildly increased left ventricular wall thickness. Left ventricular diastolic parameters were normal.  2. GLS -16.4% (underestimated due  to poor endocardial tracking).  3. The right ventricle has normal systolic function. The cavity was normal. There is no increase in right ventricular wall thickness.  4. Mild calcification of the mitral valve leaflet.  5. Mild calcification of the aortic valve. No stenosis of the aortic valve.  6. The aorta is normal in size and structure.   09/02/2019 Imaging   No evidence of recurrent or metastatic carcinoma within the abdomen or pelvis.   Colonic diverticulosis. No radiographic evidence of diverticulitis.   Increased large stool burden noted; recommend clinical correlation for possible constipation.   09/02/2019 Echocardiogram   1. Left ventricular ejection fraction, by visual estimation, is 60 to 65%. The left ventricle has normal function. There is no left ventricular hypertrophy.  2. Global right ventricle has normal systolic function.The right ventricular size is normal. No increase in right ventricular wall thickness.  3. Left atrial size was normal.  4. Right atrial size was normal.  5. The mitral valve is normal in structure. No evidence of mitral valve regurgitation.  6. The tricuspid valve is normal in structure. Tricuspid valve regurgitation is trivial.  7. The aortic valve is normal in structure. Aortic valve regurgitation is not visualized.  8. The pulmonic valve was normal in structure. Pulmonic valve regurgitation is trivial.  9. Mildly elevated pulmonary artery systolic pressure. 10. The average left ventricular global longitudinal strain is -26.9 %.   09/03/2019 Tumor Marker   Patient's tumor was tested for the following markers: CA-125 Results of the tumor  marker test revealed 13.5   11/05/2019 Tumor Marker   Patient's tumor was tested for the following markers: CA-125 Results of the tumor marker test revealed 27.   11/25/2019 Imaging   1. New omental nodules suspicious for early peritoneal spread of malignancy. 2. New thickening along the right paracolic gutter concerning for potential tumor. 3. Other imaging findings of potential clinical significance: Mild cardiomegaly. Stable lateral segment left hepatic lobe hypodense lesions, likely cysts. Multilevel posterior decompression with multilevel foraminal impingement. 4. 4 mm left lower lobe pulmonary nodule not readily appreciable on the prior exam, could be inflammatory or neoplastic. Surveillance suggested. 5. Other imaging findings of potential clinical significance: Mild cardiomegaly. Suspected hepatic cysts. Multilevel lumbar impingement.   Aortic Atherosclerosis (ICD10-I70.0).   11/26/2019 Tumor Marker   Patient's tumor was tested for the following markers: CA-125 Results of the tumor marker test revealed 129   12/11/2019 -  Chemotherapy   The patient had carboplatin and taxol for chemotherapy treatment.     01/01/2020 Tumor Marker   Patient's tumor was tested for the following markers: CA-125 Results of the tumor marker test revealed 123   01/22/2020 Tumor Marker   Patient's tumor was tested for the following markers: CA-125 Results of the tumor marker test revealed 109.   02/11/2020 Imaging   1. Interval worsening of disease as evidenced by new and enlarging peritoneal and omental nodularity. 2. Slight enlargement of retroperitoneal lymph nodes. 3. New small area of low attenuation along the medial margin of the spleen, potentially small capsular implant not seen on the previous study.   Aortic Atherosclerosis (ICD10-I70.0).     02/19/2020 -  Chemotherapy   The patient had pembrolizumab plus Lenvatinib for chemotherapy treatment.     02/19/2020 Tumor Marker   Patient's tumor  was tested for the following markers: CA-125 Results of the tumor marker test revealed 308   04/01/2020 Tumor Marker   Patient's tumor was tested for the following markers: CA-125 Results  of the tumor marker test revealed 375   04/22/2020 Tumor Marker   Patient's tumor was tested for the following markers: CA-125 Results of the tumor marker test revealed 592   05/04/2020 Imaging   1. Significant interval worsening of omental nodularity and soft tissue caking in the ventral abdomen, which is now essentially confluent and not discretely measurable. There is additional nodularity in the right paracolic gutter. Findings are consistent with worsened omental and peritoneal metastatic disease. 2. There is a new, ill-defined hypodense lesion of the inferior right lobe of the liver, hepatic segment VI, measuring 2.3 x 1.5 cm, concerning for hepatic metastatic disease. 3. Status post hysterectomy. Soft tissue nodule in the vicinity of the right vaginal cuff is not significantly changed, measuring 2.1 x 1.4 cm. 4. Prominent retroperitoneal lymph nodes remain subcentimeter and are not significantly changed. Attention on follow-up. 5. Aortic Atherosclerosis (ICD10-I70.0).   05/09/2020 Echocardiogram    1. Normal GLS -23.7. Left ventricular ejection fraction, by estimation, is 60 to 65%. The left ventricle has normal function. The left ventricle has no regional wall motion abnormalities. Left ventricular diastolic parameters were normal.  2. Right ventricular systolic function is normal. The right ventricular size is normal. There is mildly elevated pulmonary artery systolic pressure.  3. The mitral valve is normal in structure. Trivial mitral valve regurgitation. No evidence of mitral stenosis.  4. The aortic valve is normal in structure. Aortic valve regurgitation is not visualized. No aortic stenosis is present.  5. The inferior vena cava is normal in size with greater than 50% respiratory variability,  suggesting right atrial pressure of 3 mmHg.   05/12/2020 - 06/10/2020 Chemotherapy   The patient had DOXOrubicin HCL LIPOSOMAL (DOXIL) for chemotherapy treatment.     06/13/2020 Tumor Marker   Patient's tumor was tested for the following markers: CA-125 Results of the tumor marker test revealed 2192   06/29/2020 Imaging   1. Interval enlargement of nodular soft tissue in the low pelvis adjacent to the vaginal cuff. 2. Interval increase in omental and peritoneal caking in the ventral abdomen, with increased thickness and nodular confluence. 3. Interval enlargement of ill-defined hypoenhancing liver lesions. 4. Findings are consistent with worsened intra-abdominal metastatic disease. 5. New, moderate right pleural effusion and associated atelectasis or consolidation, presumably malignant effusion. 6. New mild right hydronephrosis and proximal hydroureter, an abrupt caliber change of the distal third of the right ureter adjacent to soft tissue in the pelvis. Findings are concerning for ureteral obstruction. 7. Unchanged enlarged bilateral iliac lymph nodes. Attention on follow-up. 8. Aortic Atherosclerosis (ICD10-I70.0).       REVIEW OF SYSTEMS:   Constitutional: Denies fevers, chills  Eyes: Denies blurriness of vision Ears, nose, mouth, throat, and face: Denies mucositis or sore throat Respiratory: Denies cough, dyspnea or wheezes Cardiovascular: Denies palpitation, chest discomfort or lower extremity swelling Skin: Denies abnormal skin rashes Lymphatics: Denies new lymphadenopathy or easy bruising Neurological:Denies numbness, tingling or new weaknesses Behavioral/Psych: Mood is stable, no new changes  All other systems were reviewed with the patient and are negative.  I have reviewed the past medical history, past surgical history, social history and family history with the patient and they are unchanged from previous note.  ALLERGIES:  is allergic to tegaderm ag mesh  [silver].  MEDICATIONS:  Current Outpatient Medications  Medication Sig Dispense Refill  . dexamethasone (DECADRON) 4 MG tablet Take 1 tablet (4 mg total) by mouth daily. 30 tablet 1  . diphenhydrAMINE (BENADRYL) 25 mg capsule Take 100  mg by mouth 2 (two) times a day.    . gabapentin (NEURONTIN) 400 MG capsule Take 3 capsules (1,200 mg total) by mouth 3 (three) times daily. 270 capsule 11  . levothyroxine (SYNTHROID) 50 MCG tablet Take 1 tablet (50 mcg total) by mouth daily before breakfast. (Patient taking differently: Take 50 mcg by mouth daily before breakfast. Separate from magnesium supplementation by at least 4 hours.) 30 tablet 11  . lidocaine-prilocaine (EMLA) cream Apply to affected area once 30 g 3  . loratadine (CLARITIN) 10 MG tablet Take 10 mg by mouth daily.    Marland Kitchen losartan (COZAAR) 100 MG tablet Take 1.5 tablets (150 mg total) by mouth daily. 90 tablet 2  . methadone (DOLOPHINE) 10 MG tablet Take 2 tablets (20 mg total) by mouth every 8 (eight) hours. 90 tablet 0  . metoprolol tartrate (LOPRESSOR) 25 MG tablet Take 25 mg by mouth 2 (two) times daily.    . ondansetron (ZOFRAN) 8 MG tablet Take 8 mg by mouth every 8 (eight) hours as needed.    Marland Kitchen oxyCODONE-acetaminophen (PERCOCET) 10-325 MG tablet Take 1-2 tablets by mouth every 6 (six) hours as needed for pain. 90 tablet 0  . prochlorperazine (COMPAZINE) 10 MG tablet TAKE 1 TABLET BY MOUTH EVERY 6 HOURS AS NEEDED FOR NAUSEA OR VOMITING 30 tablet 1  . ranitidine (ZANTAC) 150 MG capsule Take 150 mg by mouth 2 (two) times daily.    . rivaroxaban (XARELTO) 20 MG TABS tablet Take 1 tablet (20 mg total) by mouth daily with supper. 30 tablet 9  . STOOL SOFTENER 100 MG capsule Take 100 mg by mouth as needed.     No current facility-administered medications for this visit.    PHYSICAL EXAMINATION: ECOG PERFORMANCE STATUS: 2 - Symptomatic, <50% confined to bed  Vitals:   06/30/20 1340  BP: (!) 147/64  Pulse: 63  Resp: 18  Temp: (!)  97.4 F (36.3 C)  SpO2: 100%   Filed Weights   06/30/20 1340  Weight: 265 lb (120.2 kg)    GENERAL:alert, no distress and comfortable NEURO: alert & oriented x 3 with fluent speech, no focal motor/sensory deficits  LABORATORY DATA:  I have reviewed the data as listed    Component Value Date/Time   NA 140 06/10/2020 0821   K 3.6 06/10/2020 0821   CL 101 06/10/2020 0821   CO2 31 06/10/2020 0821   GLUCOSE 127 (H) 06/10/2020 0821   BUN 7 (L) 06/10/2020 0821   CREATININE 0.70 06/10/2020 0821   CALCIUM 9.3 06/10/2020 0821   PROT 6.5 06/10/2020 0821   ALBUMIN 3.1 (L) 06/10/2020 0821   AST 15 06/10/2020 0821   ALT 7 06/10/2020 0821   ALKPHOS 103 06/10/2020 0821   BILITOT 0.4 06/10/2020 0821   GFRNONAA >60 06/10/2020 0821   GFRAA >60 06/10/2020 0821    No results found for: SPEP, UPEP  Lab Results  Component Value Date   WBC 3.1 (L) 06/10/2020   NEUTROABS 1.6 (L) 06/10/2020   HGB 10.4 (L) 06/10/2020   HCT 32.4 (L) 06/10/2020   MCV 100.6 (H) 06/10/2020   PLT 214 06/10/2020      Chemistry      Component Value Date/Time   NA 140 06/10/2020 0821   K 3.6 06/10/2020 0821   CL 101 06/10/2020 0821   CO2 31 06/10/2020 0821   BUN 7 (L) 06/10/2020 0821   CREATININE 0.70 06/10/2020 0821      Component Value Date/Time   CALCIUM 9.3  06/10/2020 0821   ALKPHOS 103 06/10/2020 0821   AST 15 06/10/2020 0821   ALT 7 06/10/2020 0821   BILITOT 0.4 06/10/2020 0821       RADIOGRAPHIC STUDIES: I have reviewed multiple imaging studies with the patient and family I have personally reviewed the radiological images as listed and agreed with the findings in the report. CT ABDOMEN PELVIS W CONTRAST  Result Date: 06/29/2020 CLINICAL DATA:  Metastatic uterine cancer, right upper abdominal pain for 6 weeks, assess treatment response EXAM: CT ABDOMEN AND PELVIS WITH CONTRAST TECHNIQUE: Multidetector CT imaging of the abdomen and pelvis was performed using the standard protocol following  bolus administration of intravenous contrast. CONTRAST:  122m OMNIPAQUE IOHEXOL 300 MG/ML  SOLN COMPARISON:  05/03/2020 FINDINGS: Lower chest: There is a new, moderate right pleural effusion and associated atelectasis or consolidation. Hepatobiliary: Interval enlargement of ill-defined hypoenhancing liver lesions, an index lesion of the left lobe measuring 3.5 x 2.3 cm, previously 2.2 x 1.7 cm (series 2, image 27). Status post cholecystectomy. Redemonstrated intra and extrahepatic biliary ductal dilatation. Pancreas: Unremarkable. No pancreatic ductal dilatation or surrounding inflammatory changes. Spleen: Normal in size without significant abnormality. Adrenals/Urinary Tract: Adrenal glands are unremarkable. There is new mild right hydronephrosis and proximal hydroureter, an abrupt caliber change of the distal third of the right ureter adjacent to soft tissue in the pelvis (series 2, image 64). Bladder is unremarkable. Stomach/Bowel: Stomach is within normal limits. Appendix appears normal. No evidence of bowel wall thickening, distention, or inflammatory changes. Sigmoid diverticulosis. Vascular/Lymphatic: Scattered aortic atherosclerosis. Unchanged prominent bilateral iliac and retroperitoneal lymph nodes, an index left-sided node measuring 1.1 x 1.0 cm (series 2, image 63). Reproductive: Status post hysterectomy. Interval enlargement of nodular soft tissue in the low pelvis adjacent to the vaginal cuff, now measuring 3.8 x 3.0 cm, previously 2.1 x 1.4 cm (series 2, image 66). Other: No abdominal wall hernia or abnormality. Trace perihepatic ascites. There is an interval increase in omental and peritoneal caking in the ventral abdomen, increased and thickness and nodular confluence (series 2, image 50). Musculoskeletal: No acute or significant osseous findings. IMPRESSION: 1. Interval enlargement of nodular soft tissue in the low pelvis adjacent to the vaginal cuff. 2. Interval increase in omental and peritoneal  caking in the ventral abdomen, with increased thickness and nodular confluence. 3. Interval enlargement of ill-defined hypoenhancing liver lesions. 4. Findings are consistent with worsened intra-abdominal metastatic disease. 5. New, moderate right pleural effusion and associated atelectasis or consolidation, presumably malignant effusion. 6. New mild right hydronephrosis and proximal hydroureter, an abrupt caliber change of the distal third of the right ureter adjacent to soft tissue in the pelvis. Findings are concerning for ureteral obstruction. 7. Unchanged enlarged bilateral iliac lymph nodes. Attention on follow-up. 8. Aortic Atherosclerosis (ICD10-I70.0). Electronically Signed   By: AEddie CandleM.D.   On: 06/29/2020 12:39

## 2020-06-30 NOTE — Assessment & Plan Note (Signed)
I recommend discontinuation of furosemide to reduce risk of dehydration She will continue metoprolol and Cozaar for now

## 2020-06-30 NOTE — Assessment & Plan Note (Signed)
We discussed the importance of laxative therapy 

## 2020-07-04 ENCOUNTER — Other Ambulatory Visit: Payer: Self-pay | Admitting: Hematology and Oncology

## 2020-07-04 ENCOUNTER — Telehealth: Payer: Self-pay | Admitting: Oncology

## 2020-07-04 MED ORDER — OXYCODONE-ACETAMINOPHEN 10-325 MG PO TABS: 1.0000 | ORAL_TABLET | Freq: Four times a day (QID) | ORAL | 0 refills | Status: AC | PRN

## 2020-07-04 MED ORDER — METHADONE HCL 10 MG PO TABS
20.0000 mg | ORAL_TABLET | Freq: Three times a day (TID) | ORAL | 0 refills | Status: AC
Start: 2020-07-04 — End: ?

## 2020-07-04 NOTE — Telephone Encounter (Signed)
Kathryn [name] left a message asking for a refill or methadone and oxycodone - acetaminophen.  She ran out of the methadone on Saturday and took more oxycodone.  She would like them sent to [facility].

## 2020-07-04 NOTE — Telephone Encounter (Signed)
Called Kathlee Nations back and advised her that refills have been sent.  She is meeting with hospice tomorrow at 10 am.

## 2020-07-04 NOTE — Telephone Encounter (Signed)
Done Can you double check hospice has contacted her?

## 2020-07-08 ENCOUNTER — Ambulatory Visit: Payer: Medicare Other | Admitting: Hematology and Oncology

## 2020-07-08 ENCOUNTER — Other Ambulatory Visit: Payer: Medicare Other

## 2020-07-08 ENCOUNTER — Ambulatory Visit: Payer: Medicare Other

## 2020-07-29 ENCOUNTER — Telehealth: Payer: Self-pay

## 2020-07-29 NOTE — Telephone Encounter (Signed)
Called and left a message on daughters cell. Ask her to call the office back regarding Monday's appts.

## 2020-08-01 ENCOUNTER — Encounter: Payer: Self-pay | Admitting: Hematology and Oncology

## 2020-08-01 ENCOUNTER — Inpatient Hospital Stay: Admitting: Hematology and Oncology

## 2020-08-01 ENCOUNTER — Other Ambulatory Visit: Payer: Self-pay

## 2020-08-01 ENCOUNTER — Inpatient Hospital Stay: Attending: Hematology and Oncology

## 2020-08-01 DIAGNOSIS — G893 Neoplasm related pain (acute) (chronic): Secondary | ICD-10-CM | POA: Diagnosis not present

## 2020-08-01 DIAGNOSIS — R5381 Other malaise: Secondary | ICD-10-CM

## 2020-08-01 DIAGNOSIS — K5909 Other constipation: Secondary | ICD-10-CM | POA: Insufficient documentation

## 2020-08-01 DIAGNOSIS — R630 Anorexia: Secondary | ICD-10-CM | POA: Diagnosis not present

## 2020-08-01 DIAGNOSIS — I10 Essential (primary) hypertension: Secondary | ICD-10-CM | POA: Diagnosis not present

## 2020-08-01 DIAGNOSIS — R634 Abnormal weight loss: Secondary | ICD-10-CM | POA: Insufficient documentation

## 2020-08-01 DIAGNOSIS — C541 Malignant neoplasm of endometrium: Secondary | ICD-10-CM

## 2020-08-01 DIAGNOSIS — Z7189 Other specified counseling: Secondary | ICD-10-CM

## 2020-08-01 DIAGNOSIS — R531 Weakness: Secondary | ICD-10-CM | POA: Insufficient documentation

## 2020-08-01 DIAGNOSIS — C786 Secondary malignant neoplasm of retroperitoneum and peritoneum: Secondary | ICD-10-CM | POA: Diagnosis not present

## 2020-08-01 DIAGNOSIS — Z6841 Body Mass Index (BMI) 40.0 and over, adult: Secondary | ICD-10-CM | POA: Diagnosis not present

## 2020-08-01 MED ORDER — SODIUM CHLORIDE 0.9% FLUSH
10.0000 mL | Freq: Once | INTRAVENOUS | Status: AC
  Administered 2020-08-01: 10 mL
  Filled 2020-08-01: qty 10

## 2020-08-01 MED ORDER — HEPARIN SOD (PORK) LOCK FLUSH 100 UNIT/ML IV SOLN
500.0000 [IU] | Freq: Once | INTRAVENOUS | Status: AC
  Administered 2020-08-01: 500 [IU]
  Filled 2020-08-01: qty 5

## 2020-08-01 NOTE — Assessment & Plan Note (Signed)
She is at peace with her current health status and is pleased with the quality of care received at home through palliative care/hospice She is aware of her prognosis She is on low-dose Xarelto for DVT prophylaxis and I recommend the patient to consider stopping in the future if she have signs of bleeding She is able to say goodbye to my staff The patient is aware that she can call if questions arise in the future

## 2020-08-01 NOTE — Assessment & Plan Note (Signed)
She is debilitated with chronic pain and progressive weakness I completed application for disability parking placard for the patient

## 2020-08-01 NOTE — Assessment & Plan Note (Signed)
All her blood blood pressure medications were recently discontinued I think this is appropriate as the patient have anorexia and progressive weight loss

## 2020-08-01 NOTE — Assessment & Plan Note (Signed)
Her pain is well managed with current prescribed pain medicine Hospice program is taking care of medication refills

## 2020-08-01 NOTE — Progress Notes (Signed)
[address] OFFICE PROGRESS NOTE  Patient Care Team: Ronita Hipps, MD as PCP - General (Family Medicine) Nicholaus Bloom, MD (Anesthesiology)  ASSESSMENT & PLAN:  Endometrial cancer [facility]) The patient is satisfied with care received through palliative care/hospice program She returns today to review final plan of care and to say goodbye to staff Moving forward, I will defer to palliative care service to manage her pain and other health issues I do not recommend regular port flushes, to minimize risk of bleeding and infection The patient is aware that she can call if questions arise in the future We discussed the natural progression of disease and what to expect at the terminal stages of her condition  Essential hypertension All her blood blood pressure medications were recently discontinued I think this is appropriate as the patient have anorexia and progressive weight loss  Cancer associated pain Her pain is well managed with current prescribed pain medicine Hospice program is taking care of medication refills  Physical debility She is debilitated with chronic pain and progressive weakness I completed application for disability parking placard for the patient  Other constipation We have discussions about chronic laxative therapy  Goals of care, counseling/discussion She is at peace with her current health status and is pleased with the quality of care received at home through palliative care/hospice She is aware of her prognosis She is on low-dose Xarelto for DVT prophylaxis and I recommend the patient to consider stopping in the future if she have signs of bleeding She is able to say goodbye to my staff The patient is aware that she can call if questions arise in the future   No orders of the defined types were placed in this encounter.   All questions were answered. The patient knows to call the clinic with any problems, questions or concerns. The total  time spent in the appointment was 30 minutes encounter with patients including review of chart and various tests results, discussions about plan of care and coordination of care plan   Heath Lark, MD 08/01/2020 3:03 PM  INTERVAL HISTORY: Please see below for problem oriented charting. 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 A Pap was done 09/02/18 showign [facility]. She was referred onto GYN and seen by Dr. Paula Compton    09/09/2018 Procedure   She underwent endometrial biopsy   09/12/2018 Pathology Results   Endometrial biopsy positive for adenocarcinoma   09/22/2018 Imaging   Ct abdomen and pelvis showed: Diffuse endometrial thickening, consistent with primary endometrial carcinoma.  Pelvic and abdominal retroperitoneal and retrocrural lymphadenopathy, consistent with metastatic disease.  Moderate hepatic steatosis.   09/25/2018 Cancer Staging   Staging form: Corpus Uteri - Carcinoma and Carcinosarcoma, AJCC 8th Edition - Pathologic: Stage IIIC2 (pT2, pN2, cM0) - Signed by Heath Lark, MD on 02/23/2019   10/01/2018 Procedure   Successful placement of a right IJ approach Power Port with  ultrasound and fluoroscopic guidance. The catheter is ready for use.   10/02/2018 Tumor Marker   Patient's tumor was tested for the following markers: CA-125 Results of  the tumor marker test revealed 578   10/03/2018 - 01/16/2019 Chemotherapy   The patient had carboplatin and Taxol with dose adjustment due to neuropathy x 6 cycles   10/24/2018 Tumor Marker   Patient's tumor was tested for the following markers: CA-125 Results of the tumor marker test revealed 296    Genetic Testing   Patient has genetic testing done for MSI/MMR. Results revealed MSI stable and MMR normal on Accession [id number].   11/14/2018 Tumor Marker   Patient's tumor was tested for the following markers: CA-125 Results of the tumor marker test revealed 81   11/27/2018 Imaging   1. Significant decrease in abdominal retroperitoneal and bilateral iliac lymphadenopathy since previous study. 2. Significant decrease in abnormal endometrial soft tissue density since prior study. 3. No new or progressive metastatic disease identified.   12/05/2018 Tumor Marker   Patient's tumor was tested for the following markers: CA-125 Results of the tumor marker test revealed 46.3   12/26/2018 Tumor Marker   Patient's tumor was tested for the following markers: CA-125 Results of the tumor marker test revealed 38.2   01/16/2019 Tumor Marker   Patient's tumor was tested for the following markers: CA-125 Results of the tumor marker test revealed 30.8   02/03/2019 Imaging   1. Numerous retroperitoneal, iliac, and pelvic lymph nodes are stable or slightly decreased in size compared to immediate prior examination dated 11/27/2018 and significantly decreased in size compared to exam dated 09/20/2018.  2.  Unchanged thickening of the endometrium.  3. Other chronic, incidental, and postoperative findings as detailed above.   02/12/2019 Imaging   1. Uterus +/- tubes/ovaries, neoplastic, cervix, bilateral fallopian tubes and ovaries - MIXED HIGH-GRADE SEROUS AND ENDOMETRIAL ADENOCARCINOMA, 6.5 CM. SEE NOTE - CARCINOMA INVOLVES ENTIRE ENDOMETRIUM, ANTERIOR AND POSTERIOR LOWER UTERINE SEGMENTS AND ANTERIOR  AND POSTERIOR CERVIX. - CARCINOMA INVADES FOR A DEPTH OF 1.9 CM WHERE MYOMETRIAL THICKNESS IS 2.0 CM (GREATER THAN 50% MYOMETRIAL INVASION) - CARCINOMA IS 1 MM FROM THE CERVICAL MARGIN - NEGATIVE FOR LYMPHOVASCULAR OR PERINEURAL INVASION - BILATERAL UNREMARKABLE FALLOPIAN TUBES AND OVARIES, NEGATIVE FOR CARCINOMA - SEE ONCOLOGY TABLE 2. Lymph node, biopsy, right pelvic - METASTATIC CARCINOMA TO ONE OF TWO LYMPH NODES (1/2) Microscopic Comment 1. (v4.1.0.0) UTERUS, CARCINOMA OR CARCINOSARCOMA Procedure: Total hysterectomy with bilateral salpingo-oophorectomy Histologic type: Mixed high-grade serous and endometrioid adenocarcinoma Histologic Grade: NA Myometrial invasion: Present Depth of invasion: 19 mm Myometrial thickness: 20 mm Uterine Serosa Involvement: Not identified Cervical stromal involvement: Present Extent of involvement of other organs: NA Lymphovascular invasion: Not identified Regional Lymph Nodes: Examined: 0 Sentinel 2 Non-sentinel 2 Total Lymph nodes with metastasis: 1 Isolated tumor cells (< 0.2 mm): 0 Micrometastasis: (> 0.2 mm and < 2.0 mm): 1 Macrometastasis: (> 2.0 mm): 0 Extracapsular extension: Not identified Tumor block for ancillary studies: 1F MMR / MSI testing: Pending Pathologic Stage Classification (pTNM, AJCC 8th edition): pT2, pN1mi FIGO Stage: IIIC1 Representative Tumor Block: 1A, 1C (v4.1.0.0) Diagnosis Note 1. Immunohistochemical stains for p16, p53, ER and Ki-67 show a pattern of staining consistent with mixed high-grade serous and endometrial adenocarcinoma. Dr. Kish has reviewed this case and concurs with the above interpretation. A molecular study for microsatellite instability (MSI) and immunostains for MMR-related proteins are pending and will be reported in an addendum.   02/12/2019 Surgery   Pre-operative Diagnosis:   endometrial cancer stage IIIC2, s/p neoadjuvant chemotherapy, extreme obesity (BMI 53kg/m2)  Operation: Robotic-assisted  laparoscopic total hysterectomy with bilateral salpingoophorectomy, right pelvic lymphadenectomy. 22 modifier for extreme obesity  Surgeon: Donaciano Eva  Assistant Surgeon: Lahoma Crocker MD  Operative Findings:  : extreme obesity, BMI 53kg/m2, with significant retroperitoneal and intraperitoneal adiposity. Obesity necessitated an additional hour of operating time to create safe exposure. Obesity required additional personnel in the operating room for positioning and retraction. Severe obesity substantially increased the complexity of the procedure. 8cm uterus, grossly normal, normal appearing cervix. Retroperitoneal fibrosis consistent with nodal positivity. Clinically suspicious right pelvic lymph node. Unable to completely visualize retroperitoneal nodes due to extreme obesity.    02/12/2019 Pathology Results   IMMUNOHISTOCHEMICAL AND MORPHOMETRIC ANALYSIS PERFORMED MANUALLY The tumor cells are POSITIVE for Her2 (3+). Estrogen Receptor: 70%, POSITIVE, STRONG STAINING INTENSITY Progesterone Receptor: 70%, POSITIVE, STRONG STAINING INTENSITY Proliferation Marker Ki67: 20% REFERENCE RANGE ESTROGEN RECEPTOR NEGATIVE 0% POSITIVE =>1% REFERENCE RANGE PROGESTERONE RECEPTOR NEGATIVE 0% POSITIVE =>1% All controls stained appropriately  1. Uterus +/- tubes/ovaries, neoplastic, cervix, bilateral fallopian tubes and ovaries - MIXED HIGH-GRADE SEROUS AND ENDOMETRIAL ADENOCARCINOMA, 6.5 CM. SEE NOTE - CARCINOMA INVOLVES ENTIRE ENDOMETRIUM, ANTERIOR AND POSTERIOR LOWER UTERINE SEGMENTS AND ANTERIOR AND POSTERIOR CERVIX. - CARCINOMA INVADES FOR A DEPTH OF 1.9 CM WHERE MYOMETRIAL THICKNESS IS 2.0 CM (GREATER THAN 50% MYOMETRIAL INVASION) - CARCINOMA IS 1 MM FROM THE CERVICAL MARGIN - NEGATIVE FOR LYMPHOVASCULAR OR PERINEURAL INVASION - BILATERAL UNREMARKABLE FALLOPIAN TUBES AND OVARIES, NEGATIVE FOR CARCINOMA - SEE ONCOLOGY TABLE 2. Lymph node, biopsy, right pelvic - METASTATIC  CARCINOMA TO ONE OF TWO LYMPH NODES (1/2) Microscopic Comment 1. (v4.1.0.0) UTERUS, CARCINOMA OR CARCINOSARCOMA Procedure: Total hysterectomy with bilateral salpingo-oophorectomy Histologic type: Mixed high-grade serous and endometrioid adenocarcinoma Histologic Grade: NA Myometrial invasion: Present Depth of invasion: 19 mm Myometrial thickness: 20 mm Uterine Serosa Involvement: Not identified Cervical stromal involvement: Present Extent of involvement of other organs: NA Lymphovascular invasion: Not identified Regional Lymph Nodes: Examined: 0 Sentinel 2 Non-sentinel 2 Total Lymph nodes with metastasis: 1 Isolated tumor cells (< 0.2 mm): 0 Micrometastasis: (> 0.2 mm and < 2.0 mm): 1 Macrometastasis: (> 2.0 mm): 0 Extracapsular extension: Not identified Tumor block for ancillary studies: 38F MMR / MSI testing: Pending Pathologic Stage Classification (pTNM, AJCC 8th edition): pT2, pN50m FIGO Stage: IIIC1 Representative Tumor Block: 1A, 1C   03/17/2019 Echocardiogram   1. The left ventricle has normal systolic function, with an ejection fraction of 55-60%. The cavity size was normal. There is moderate concentric left ventricular hypertrophy. Left ventricular diastolic Doppler parameters are consistent with impaired relaxation.  2. The right ventricle has normal systolic function. The cavity was normal. There is no increase in right ventricular wall thickness.  3. GLS: -15.5%, no prior study available for comparison.   03/17/2019 Tumor Marker   Patient's tumor was tested for the following markers: CA-125 Results of the tumor marker test revealed 43   03/20/2019 -  Chemotherapy   The patient had trastuzumab for chemotherapy treatment.     04/30/2019 Tumor Marker   Patient's tumor was tested for the following markers:CA-125 Results of the tumor marker test revealed 29   05/20/2019 Imaging   1. No evidence of metastatic disease. 2. Small pelvic and left paracolic gutter ascites,  new. 3. Aortic atherosclerosis (ICD10-170.0). Coronary artery calcification.     05/26/2019 Echocardiogram      1. The left ventricle has normal systolic function with an ejection fraction of  60-65%. The cavity size was normal. There is mildly increased left ventricular wall thickness. Left ventricular diastolic parameters were normal.  2. GLS -16.4% (underestimated due to poor endocardial tracking).  3. The right ventricle has normal systolic function. The cavity was normal. There is no increase in right ventricular wall thickness.  4. Mild calcification of the mitral valve leaflet.  5. Mild calcification of the aortic valve. No stenosis of the aortic valve.  6. The aorta is normal in size and structure.   09/02/2019 Imaging   No evidence of recurrent or metastatic carcinoma within the abdomen or pelvis.   Colonic diverticulosis. No radiographic evidence of diverticulitis.   Increased large stool burden noted; recommend clinical correlation for possible constipation.   09/02/2019 Echocardiogram   1. Left ventricular ejection fraction, by visual estimation, is 60 to 65%. The left ventricle has normal function. There is no left ventricular hypertrophy.  2. Global right ventricle has normal systolic function.The right ventricular size is normal. No increase in right ventricular wall thickness.  3. Left atrial size was normal.  4. Right atrial size was normal.  5. The mitral valve is normal in structure. No evidence of mitral valve regurgitation.  6. The tricuspid valve is normal in structure. Tricuspid valve regurgitation is trivial.  7. The aortic valve is normal in structure. Aortic valve regurgitation is not visualized.  8. The pulmonic valve was normal in structure. Pulmonic valve regurgitation is trivial.  9. Mildly elevated pulmonary artery systolic pressure. 10. The average left ventricular global longitudinal strain is -26.9 %.   09/03/2019 Tumor Marker   Patient's tumor was  tested for the following markers: CA-125 Results of the tumor marker test revealed 13.5   11/05/2019 Tumor Marker   Patient's tumor was tested for the following markers: CA-125 Results of the tumor marker test revealed 27.   11/25/2019 Imaging   1. New omental nodules suspicious for early peritoneal spread of malignancy. 2. New thickening along the right paracolic gutter concerning for potential tumor. 3. Other imaging findings of potential clinical significance: Mild cardiomegaly. Stable lateral segment left hepatic lobe hypodense lesions, likely cysts. Multilevel posterior decompression with multilevel foraminal impingement. 4. 4 mm left lower lobe pulmonary nodule not readily appreciable on the prior exam, could be inflammatory or neoplastic. Surveillance suggested. 5. Other imaging findings of potential clinical significance: Mild cardiomegaly. Suspected hepatic cysts. Multilevel lumbar impingement.   Aortic Atherosclerosis (ICD10-I70.0).   11/26/2019 Tumor Marker   Patient's tumor was tested for the following markers: CA-125 Results of the tumor marker test revealed 129   12/11/2019 -  Chemotherapy   The patient had carboplatin and taxol for chemotherapy treatment.     01/01/2020 Tumor Marker   Patient's tumor was tested for the following markers: CA-125 Results of the tumor marker test revealed 123   01/22/2020 Tumor Marker   Patient's tumor was tested for the following markers: CA-125 Results of the tumor marker test revealed 109.   02/11/2020 Imaging   1. Interval worsening of disease as evidenced by new and enlarging peritoneal and omental nodularity. 2. Slight enlargement of retroperitoneal lymph nodes. 3. New small area of low attenuation along the medial margin of the spleen, potentially small capsular implant not seen on the previous study.   Aortic Atherosclerosis (ICD10-I70.0).     02/19/2020 -  Chemotherapy   The patient had pembrolizumab plus Lenvatinib for  chemotherapy treatment.     02/19/2020 Tumor Marker   Patient's tumor was tested for the following markers:   CA-125 Results of the tumor marker test revealed 308   04/01/2020 Tumor Marker   Patient's tumor was tested for the following markers: CA-125 Results of the tumor marker test revealed 375   04/22/2020 Tumor Marker   Patient's tumor was tested for the following markers: CA-125 Results of the tumor marker test revealed 592   05/04/2020 Imaging   1. Significant interval worsening of omental nodularity and soft tissue caking in the ventral abdomen, which is now essentially confluent and not discretely measurable. There is additional nodularity in the right paracolic gutter. Findings are consistent with worsened omental and peritoneal metastatic disease. 2. There is a new, ill-defined hypodense lesion of the inferior right lobe of the liver, hepatic segment VI, measuring 2.3 x 1.5 cm, concerning for hepatic metastatic disease. 3. Status post hysterectomy. Soft tissue nodule in the vicinity of the right vaginal cuff is not significantly changed, measuring 2.1 x 1.4 cm. 4. Prominent retroperitoneal lymph nodes remain subcentimeter and are not significantly changed. Attention on follow-up. 5. Aortic Atherosclerosis (ICD10-I70.0).   05/09/2020 Echocardiogram    1. Normal GLS -23.7. Left ventricular ejection fraction, by estimation, is 60 to 65%. The left ventricle has normal function. The left ventricle has no regional wall motion abnormalities. Left ventricular diastolic parameters were normal.  2. Right ventricular systolic function is normal. The right ventricular size is normal. There is mildly elevated pulmonary artery systolic pressure.  3. The mitral valve is normal in structure. Trivial mitral valve regurgitation. No evidence of mitral stenosis.  4. The aortic valve is normal in structure. Aortic valve regurgitation is not visualized. No aortic stenosis is present.  5. The inferior vena cava  is normal in size with greater than 50% respiratory variability, suggesting right atrial pressure of 3 mmHg.   05/12/2020 - 06/10/2020 Chemotherapy   The patient had DOXOrubicin HCL LIPOSOMAL (DOXIL) for chemotherapy treatment.     06/13/2020 Tumor Marker   Patient's tumor was tested for the following markers: CA-125 Results of the tumor marker test revealed 2192   06/29/2020 Imaging   1. Interval enlargement of nodular soft tissue in the low pelvis adjacent to the vaginal cuff. 2. Interval increase in omental and peritoneal caking in the ventral abdomen, with increased thickness and nodular confluence. 3. Interval enlargement of ill-defined hypoenhancing liver lesions. 4. Findings are consistent with worsened intra-abdominal metastatic disease. 5. New, moderate right pleural effusion and associated atelectasis or consolidation, presumably malignant effusion. 6. New mild right hydronephrosis and proximal hydroureter, an abrupt caliber change of the distal third of the right ureter adjacent to soft tissue in the pelvis. Findings are concerning for ureteral obstruction. 7. Unchanged enlarged bilateral iliac lymph nodes. Attention on follow-up. 8. Aortic Atherosclerosis (ICD10-I70.0).       REVIEW OF SYSTEMS:   Constitutional: Denies fevers, chills  Eyes: Denies blurriness of vision Ears, nose, mouth, throat, and face: Denies mucositis or sore throat Respiratory: Denies cough, dyspnea or wheezes Cardiovascular: Denies palpitation, chest discomfort or lower extremity swelling Skin: Denies abnormal skin rashes Lymphatics: Denies new lymphadenopathy or easy bruising Neurological:Denies numbness, tingling or new weaknesses Behavioral/Psych: Mood is stable, no new changes  All other systems were reviewed with the patient and are negative.  I have reviewed the past medical history, past surgical history, social history and family history with the patient and they are unchanged from previous  note.  ALLERGIES:  is allergic to tegaderm ag mesh [silver].  MEDICATIONS:  Current Outpatient Medications  Medication Sig Dispense Refill  .  dexamethasone (DECADRON) 4 MG tablet Take 1 tablet (4 mg total) by mouth daily. 30 tablet 1  . diphenhydrAMINE (BENADRYL) 25 mg capsule Take 100 mg by mouth 2 (two) times a day.    . gabapentin (NEURONTIN) 400 MG capsule Take 3 capsules (1,200 mg total) by mouth 3 (three) times daily. 270 capsule 11  . levothyroxine (SYNTHROID) 50 MCG tablet Take 1 tablet (50 mcg total) by mouth daily before breakfast. (Patient taking differently: Take 50 mcg by mouth daily before breakfast. Separate from magnesium supplementation by at least 4 hours.) 30 tablet 11  . lidocaine-prilocaine (EMLA) cream Apply to affected area once 30 g 3  . loratadine (CLARITIN) 10 MG tablet Take 10 mg by mouth daily.    . methadone (DOLOPHINE) 10 MG tablet Take 2 tablets (20 mg total) by mouth every 8 (eight) hours. 90 tablet 0  . ondansetron (ZOFRAN) 8 MG tablet Take 8 mg by mouth every 8 (eight) hours as needed.    Marland Kitchen oxyCODONE-acetaminophen (PERCOCET) 10-325 MG tablet Take 1-2 tablets by mouth every 6 (six) hours as needed for pain. 90 tablet 0  . prochlorperazine (COMPAZINE) 10 MG tablet TAKE 1 TABLET BY MOUTH EVERY 6 HOURS AS NEEDED FOR NAUSEA OR VOMITING 30 tablet 1  . ranitidine (ZANTAC) 150 MG capsule Take 150 mg by mouth 2 (two) times daily.    . rivaroxaban (XARELTO) 20 MG TABS tablet Take 1 tablet (20 mg total) by mouth daily with supper. 30 tablet 9  . STOOL SOFTENER 100 MG capsule Take 100 mg by mouth as needed.     No current facility-administered medications for this visit.    PHYSICAL EXAMINATION: ECOG PERFORMANCE STATUS: 2 - Symptomatic, <50% confined to bed  Vitals:   08/01/20 1246  BP: (!) 152/70  Pulse: (!) 18  Temp: 98.3 F (36.8 C)  SpO2: 100%   Filed Weights   08/01/20 1246  Weight: 265 lb 3.2 oz (120.3 kg)    GENERAL:alert, no distress and  comfortable NEURO: alert & oriented x 3 with fluent speech, no focal motor/sensory deficits  LABORATORY DATA:  I have reviewed the data as listed    Component Value Date/Time   NA 140 06/10/2020 0821   K 3.6 06/10/2020 0821   CL 101 06/10/2020 0821   CO2 31 06/10/2020 0821   GLUCOSE 127 (H) 06/10/2020 0821   BUN 7 (L) 06/10/2020 0821   CREATININE 0.70 06/10/2020 0821   CALCIUM 9.3 06/10/2020 0821   PROT 6.5 06/10/2020 0821   ALBUMIN 3.1 (L) 06/10/2020 0821   AST 15 06/10/2020 0821   ALT 7 06/10/2020 0821   ALKPHOS 103 06/10/2020 0821   BILITOT 0.4 06/10/2020 0821   GFRNONAA >60 06/10/2020 0821   GFRAA >60 06/10/2020 0821    No results found for: SPEP, UPEP  Lab Results  Component Value Date   WBC 3.1 (L) 06/10/2020   NEUTROABS 1.6 (L) 06/10/2020   HGB 10.4 (L) 06/10/2020   HCT 32.4 (L) 06/10/2020   MCV 100.6 (H) 06/10/2020   PLT 214 06/10/2020      Chemistry      Component Value Date/Time   NA 140 06/10/2020 0821   K 3.6 06/10/2020 0821   CL 101 06/10/2020 0821   CO2 31 06/10/2020 0821   BUN 7 (L) 06/10/2020 0821   CREATININE 0.70 06/10/2020 0821      Component Value Date/Time   CALCIUM 9.3 06/10/2020 0821   ALKPHOS 103 06/10/2020 0821   AST 15 06/10/2020  0821   ALT 7 06/10/2020 0821   BILITOT 0.4 06/10/2020 3710

## 2020-08-01 NOTE — Assessment & Plan Note (Signed)
The patient is satisfied with care received through palliative care/hospice program She returns today to review final plan of care and to say goodbye to staff Moving forward, I will defer to palliative care service to manage her pain and other health issues I do not recommend regular port flushes, to minimize risk of bleeding and infection The patient is aware that she can call if questions arise in the future We discussed the natural progression of disease and what to expect at the terminal stages of her condition

## 2020-08-01 NOTE — Assessment & Plan Note (Signed)
We have discussions about chronic laxative therapy

## 2020-08-11 ENCOUNTER — Telehealth: Payer: Self-pay | Admitting: Oncology

## 2020-08-11 NOTE — Telephone Encounter (Signed)
Yes that is ok to stop

## 2020-08-11 NOTE — Telephone Encounter (Signed)
Kathryn [name] with [facility] left a message asking if it is safe for Kathryn [name] to stop taking Xarelto.

## 2020-08-11 NOTE — Telephone Encounter (Signed)
Called Lanette back at [phone] and left a message advising that it is OK to stop taking the Xarelto per Dr. Alvy Bimler.

## 2020-08-31 ENCOUNTER — Other Ambulatory Visit: Payer: Self-pay | Admitting: Infectious Diseases

## 2020-08-31 ENCOUNTER — Ambulatory Visit (HOSPITAL_COMMUNITY): Payer: Medicare Other

## 2020-08-31 ENCOUNTER — Other Ambulatory Visit (HOSPITAL_COMMUNITY): Payer: Self-pay | Admitting: Infectious Diseases

## 2020-08-31 DIAGNOSIS — J90 Pleural effusion, not elsewhere classified: Secondary | ICD-10-CM

## 2020-09-01 ENCOUNTER — Other Ambulatory Visit (HOSPITAL_COMMUNITY)
Admission: RE | Admit: 2020-09-01 | Discharge: 2020-09-01 | Disposition: A | Discharge status: Home / Self Care | Source: Ambulatory Visit | Attending: Hematology and Oncology | Admitting: Hematology and Oncology

## 2020-09-01 DIAGNOSIS — Z20822 Contact with and (suspected) exposure to covid-19: Secondary | ICD-10-CM | POA: Insufficient documentation

## 2020-09-01 LAB — SARS CORONAVIRUS 2 (TAT 6-24 HRS): SARS Coronavirus 2: NEGATIVE

## 2020-09-02 ENCOUNTER — Ambulatory Visit (HOSPITAL_COMMUNITY)
Admission: RE | Admit: 2020-09-02 | Discharge: 2020-09-02 | Disposition: A | Discharge status: Home / Self Care | Source: Ambulatory Visit

## 2020-09-02 ENCOUNTER — Ambulatory Visit (HOSPITAL_COMMUNITY)
Admission: RE | Admit: 2020-09-02 | Discharge: 2020-09-02 | Disposition: A | Discharge status: Home / Self Care | Source: Ambulatory Visit | Attending: Family Medicine | Admitting: Family Medicine

## 2020-09-02 ENCOUNTER — Other Ambulatory Visit (HOSPITAL_COMMUNITY): Payer: Self-pay | Admitting: Infectious Diseases

## 2020-09-02 ENCOUNTER — Other Ambulatory Visit: Payer: Self-pay

## 2020-09-02 DIAGNOSIS — J90 Pleural effusion, not elsewhere classified: Secondary | ICD-10-CM

## 2020-09-02 HISTORY — PX: IR THORACENTESIS ASP PLEURAL SPACE W/IMG GUIDE: IMG5380

## 2020-09-02 MED ORDER — LIDOCAINE HCL 1 % IJ SOLN
INTRAMUSCULAR | Status: AC
  Filled 2020-09-02: qty 20

## 2020-09-02 MED ORDER — LIDOCAINE HCL (PF) 1 % IJ SOLN
INTRAMUSCULAR | Status: AC | PRN
  Administered 2020-09-02: 10 mL

## 2020-09-02 NOTE — Procedures (Signed)
PROCEDURE SUMMARY:  Successful US guided right thoracentesis. Yielded 2L of amber-colored fluid. Pt tolerated procedure well. No immediate complications.  CXR ordered; no post-procedure pneumothorax  EBL < 2 mL  Theresa Duty,  NP 09/02/2020 3:19 PM

## 2020-10-03 ENCOUNTER — Telehealth: Payer: Self-pay

## 2020-10-03 NOTE — Telephone Encounter (Signed)
Kathryn [name], Education officer, museum with [employer] called. Kathryn [name] passed yesterday.

## 2020-10-15 DEATH — deceased

## 2020-11-28 IMAGING — CT CT ABD-PELV W/ CM
2 of 5 series · 15 of 46 positions shown, 17 images · IV contrast (APPLIED)
Comparison: 02/11/2020, 11/25/2019

CLINICAL DATA: Metastatic uterine cancer, restaging

EXAM:
CT ABDOMEN AND PELVIS WITH CONTRAST
TECHNIQUE: Multidetector CT imaging of the abdomen and pelvis was performed
using the standard protocol following bolus administration of
intravenous contrast.
CONTRAST:  100mL OMNIPAQUE IOHEXOL 300 MG/ML SOLN, additional oral
enteric contrast

[Series 2: axial st · axial · 0.74mm/px · z∈[-441,-71]mm · 12 of 88 slices shown, 14 images]
[im 7/88  soft-tissue]
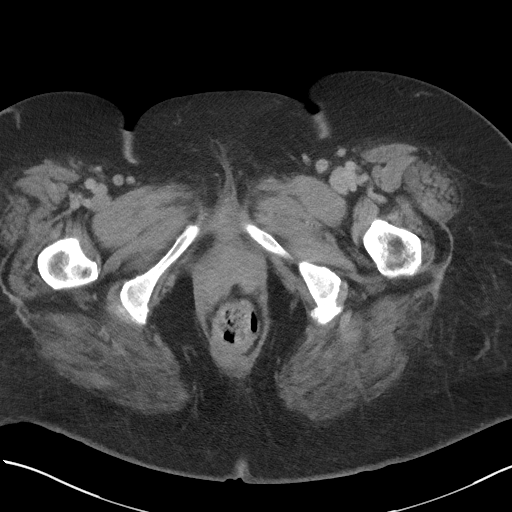
[im 7/88  bone]
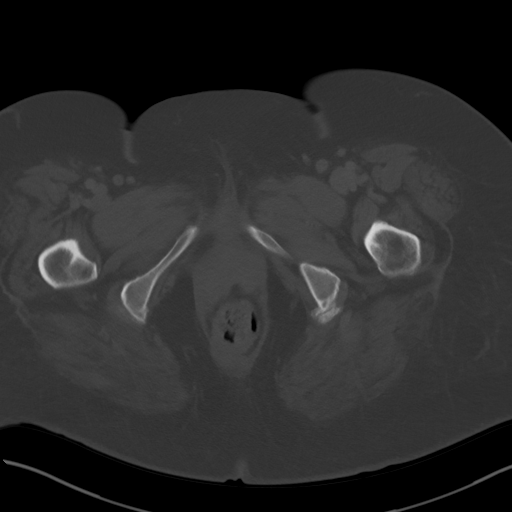
[im 14/88  soft-tissue]
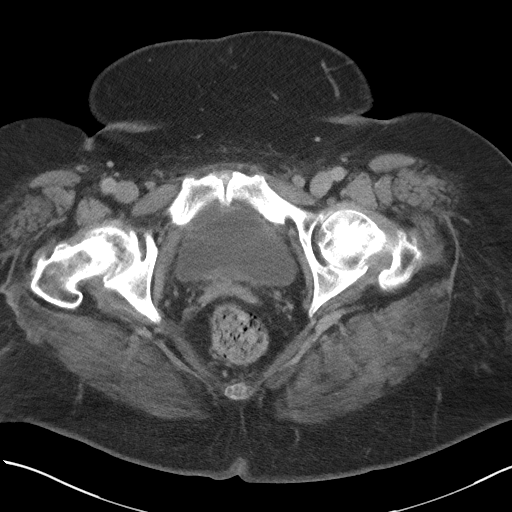
[im 21/88  soft-tissue]
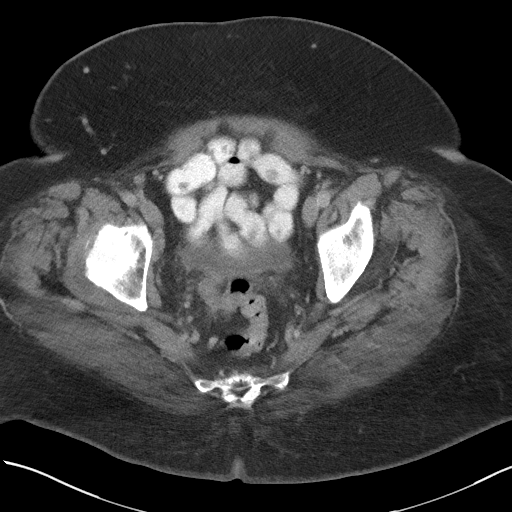
[im 27/88  soft-tissue]
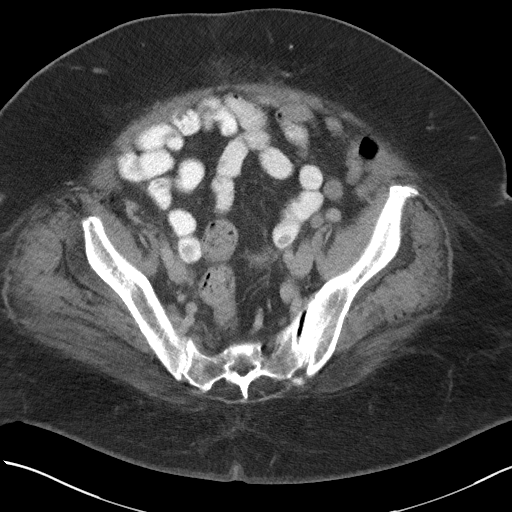
[im 34/88  soft-tissue]
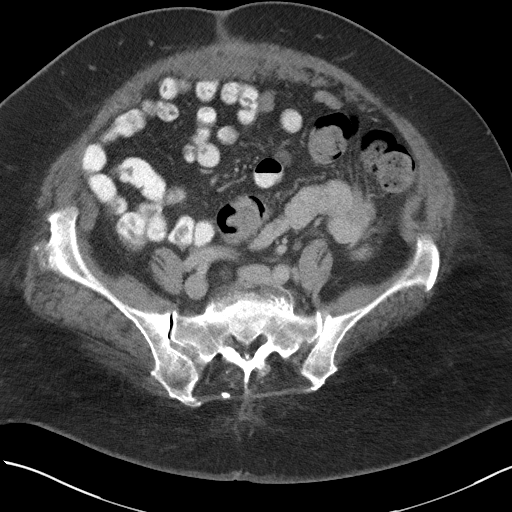
[im 41/88  soft-tissue]
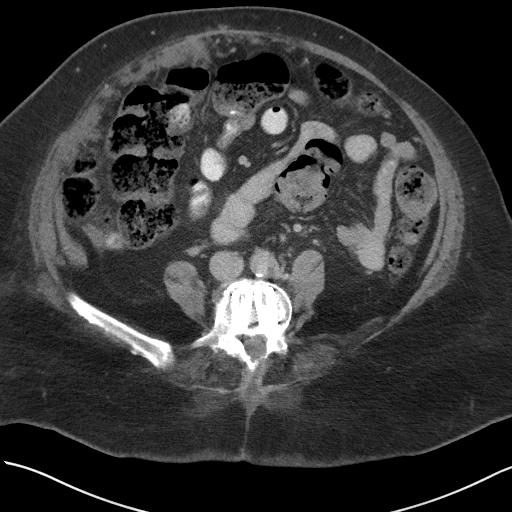
[im 47/88  soft-tissue]
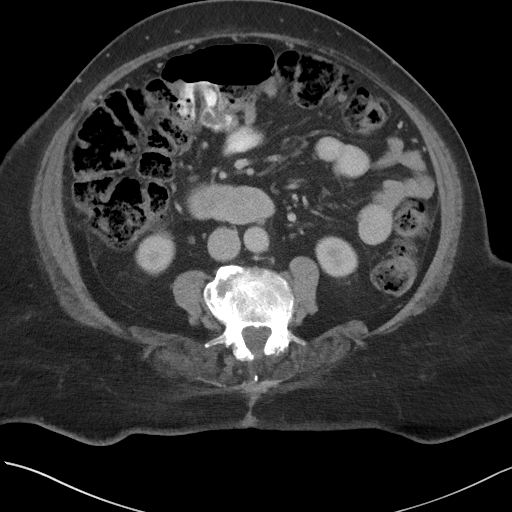
[im 54/88  soft-tissue]
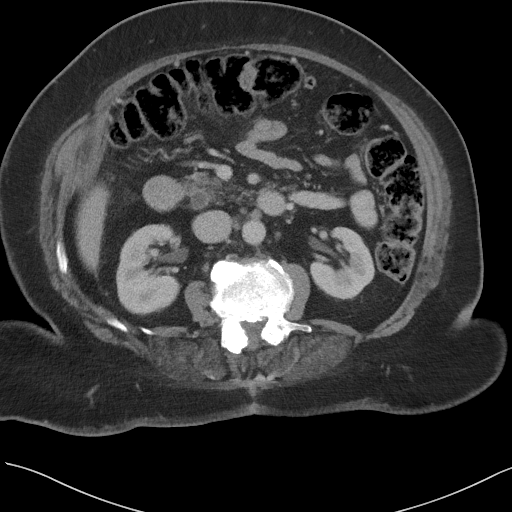
[im 61/88  soft-tissue]
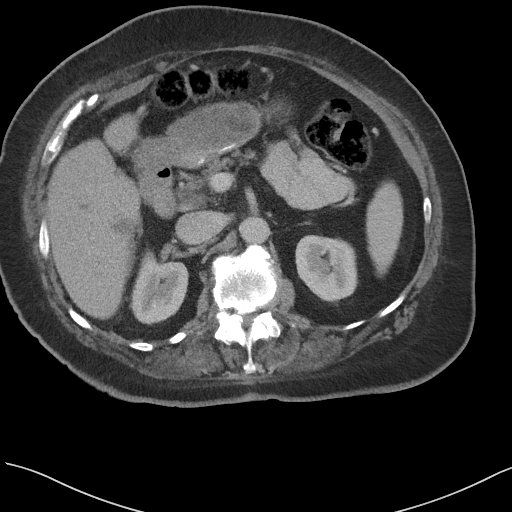
[im 61/88  bone]
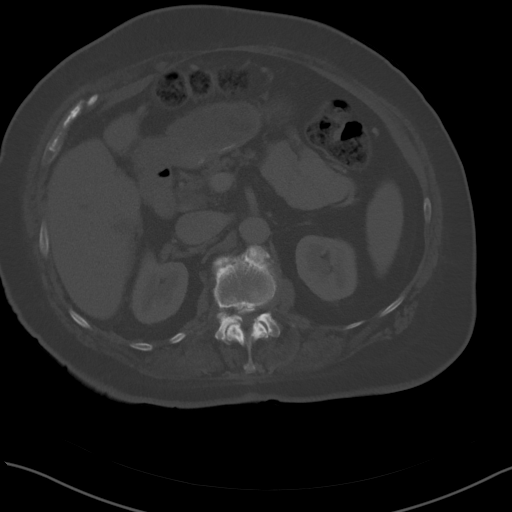
[im 67/88  soft-tissue]
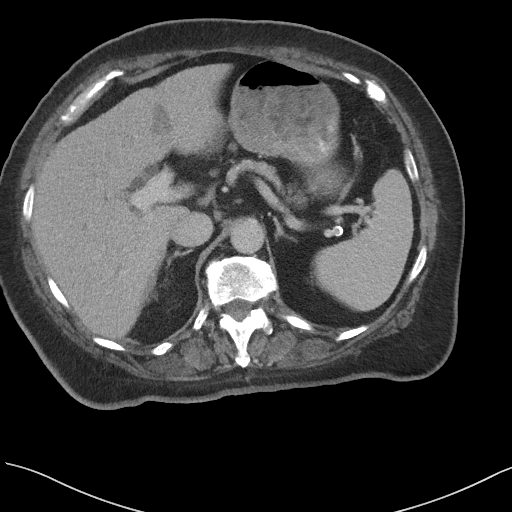
[im 74/88  soft-tissue]
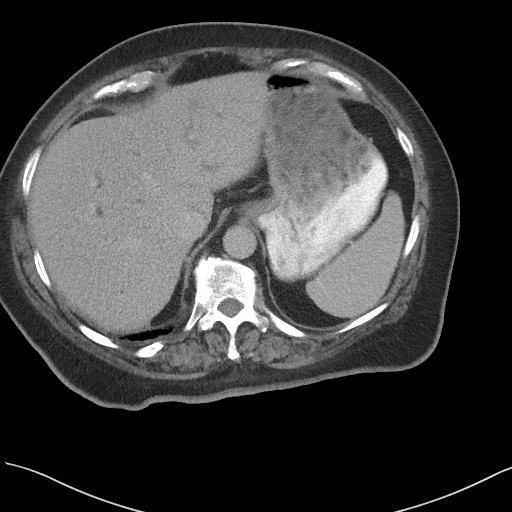
[im 81/88  soft-tissue]
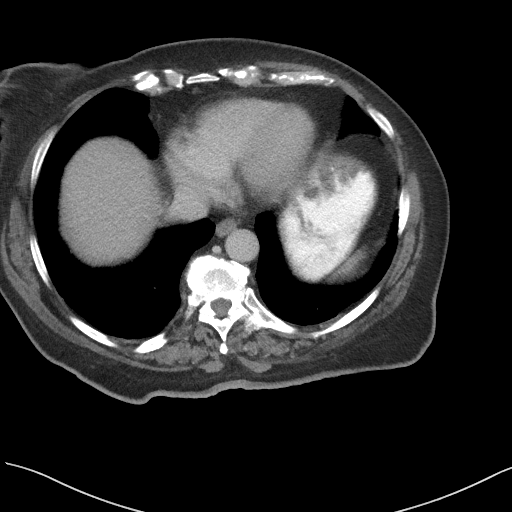

[Series 4: coronal st · coronal · 0.81mm/px · 3 of 109 slices shown]
[im 37/109  soft-tissue]
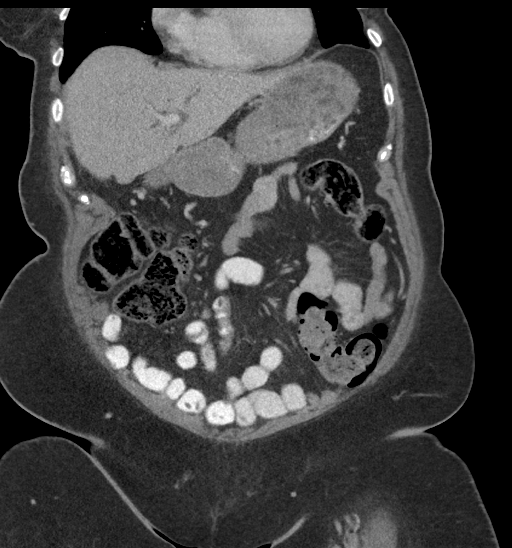
[im 49/109  soft-tissue]
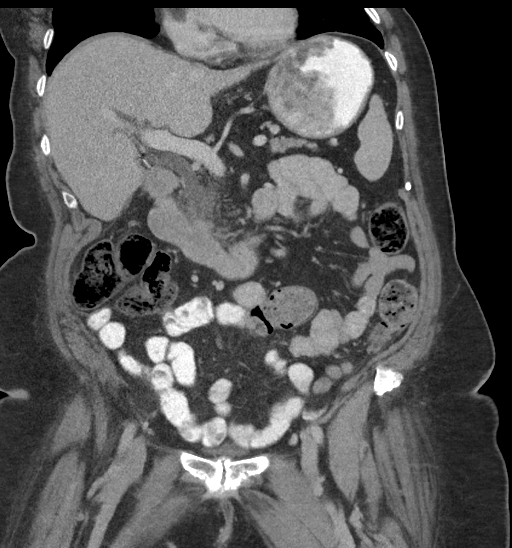
[im 61/109  soft-tissue]
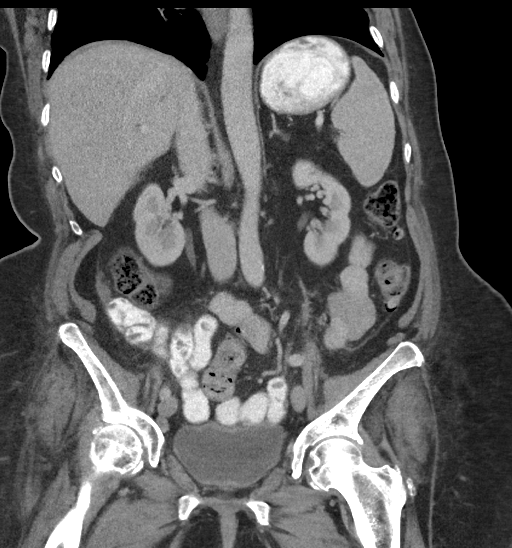

[15 of 46 positions shown; findings below may reference images not displayed]

FINDINGS: Lower chest: No acute abnormality.

Hepatobiliary: There is a new, ill-defined hypodense lesion of the
inferior right lobe of the liver, hepatic segment VI, measuring
x 1.5 cm (series 2, image 27). Fluid attenuation cysts or
hemangiomata of the left lobe of the liver. Status post
cholecystectomy. Redemonstrated postoperative ductal dilatation.

Pancreas: Unremarkable. No pancreatic ductal dilatation or
surrounding inflammatory changes.

Spleen: Normal in size without significant abnormality.

Adrenals/Urinary Tract: Adrenal glands are unremarkable. Kidneys are
normal, without renal calculi, solid lesion, or hydronephrosis.
Bladder is unremarkable.

Stomach/Bowel: Stomach is within normal limits. Appendix appears
normal. No evidence of bowel wall thickening, distention, or
inflammatory changes.

Vascular/Lymphatic: Scattered aortic atherosclerosis. Prominent
retroperitoneal lymph nodes remain subcentimeter and are not
significantly changed (series 2, image 36).

Reproductive: Status post hysterectomy. Soft tissue nodule in the
vicinity of the right vaginal cuff is not significantly changed,
measuring 2.1 x 1.4 cm (series 2, image 68).

Other: No abdominal wall hernia or abnormality. No abdominopelvic
ascites. Significant interval worsening of omental nodularity and
soft tissue caking in the ventral abdomen, which is now essentially
confluent and not discretely measurable (series 2, image 52). There
is additional nodularity in the right paracolic gutter (series 2,
image 46).

Musculoskeletal: No acute or significant osseous findings.
IMPRESSION: 1. Significant interval worsening of omental nodularity and soft
tissue caking in the ventral abdomen, which is now essentially
confluent and not discretely measurable. There is additional
nodularity in the right paracolic gutter. Findings are consistent
with worsened omental and peritoneal metastatic disease.
2. There is a new, ill-defined hypodense lesion of the inferior
right lobe of the liver, hepatic segment VI, measuring 2.3 x 1.5 cm
, concerning for hepatic metastatic disease.
3. Status post hysterectomy. Soft tissue nodule in the vicinity of
the right vaginal cuff is not significantly changed, measuring 2.1 x
1.4 cm.
4. Prominent retroperitoneal lymph nodes remain subcentimeter and
are not significantly changed. Attention on follow-up.
5. Aortic Atherosclerosis ([id number]).
